# Patient Record
Sex: Male | Born: 1965 | Race: White | Hispanic: No | Marital: Married | Smoking: Former smoker
Health system: Southern US, Community
[De-identification: ages and names within clinical notes are randomized; demographics above are authoritative.]

## PROBLEM LIST (undated history)

## (undated) DIAGNOSIS — Z803 Family history of malignant neoplasm of breast: Secondary | ICD-10-CM

## (undated) DIAGNOSIS — Z800 Family history of malignant neoplasm of digestive organs: Secondary | ICD-10-CM

## (undated) DIAGNOSIS — Z8489 Family history of other specified conditions: Secondary | ICD-10-CM

## (undated) DIAGNOSIS — J189 Pneumonia, unspecified organism: Secondary | ICD-10-CM

## (undated) HISTORY — PX: APPENDECTOMY: SHX54

## (undated) HISTORY — DX: Family history of malignant neoplasm of breast: Z80.3

## (undated) HISTORY — DX: Family history of malignant neoplasm of digestive organs: Z80.0

## (undated) HISTORY — PX: OTHER SURGICAL HISTORY: SHX169

---

## 2000-06-18 ENCOUNTER — Encounter: Payer: Self-pay | Admitting: Orthopedic Surgery

## 2000-06-19 ENCOUNTER — Encounter: Payer: Self-pay | Admitting: Orthopedic Surgery

## 2000-06-19 ENCOUNTER — Inpatient Hospital Stay (HOSPITAL_COMMUNITY): Admission: AD | Admit: 2000-06-19 | Discharge: 2000-06-21 | Payer: Self-pay | Admitting: Orthopedic Surgery

## 2000-06-20 ENCOUNTER — Encounter: Payer: Self-pay | Admitting: Orthopedic Surgery

## 2001-09-21 ENCOUNTER — Encounter: Payer: Self-pay | Admitting: Emergency Medicine

## 2001-09-21 ENCOUNTER — Emergency Department (HOSPITAL_COMMUNITY): Admission: EM | Admit: 2001-09-21 | Discharge: 2001-09-22 | Payer: Self-pay | Admitting: *Deleted

## 2003-03-31 ENCOUNTER — Encounter: Payer: Self-pay | Admitting: Emergency Medicine

## 2003-03-31 ENCOUNTER — Emergency Department (HOSPITAL_COMMUNITY): Admission: EM | Admit: 2003-03-31 | Discharge: 2003-03-31 | Payer: Self-pay | Admitting: Emergency Medicine

## 2012-09-16 ENCOUNTER — Emergency Department (HOSPITAL_BASED_OUTPATIENT_CLINIC_OR_DEPARTMENT_OTHER): Payer: Self-pay

## 2012-09-16 ENCOUNTER — Emergency Department (HOSPITAL_BASED_OUTPATIENT_CLINIC_OR_DEPARTMENT_OTHER)
Admission: EM | Admit: 2012-09-16 | Discharge: 2012-09-17 | Disposition: A | Discharge status: Home / Self Care | Payer: Self-pay | Attending: Emergency Medicine | Admitting: Emergency Medicine

## 2012-09-16 ENCOUNTER — Encounter (HOSPITAL_BASED_OUTPATIENT_CLINIC_OR_DEPARTMENT_OTHER): Payer: Self-pay | Admitting: *Deleted

## 2012-09-16 DIAGNOSIS — Y929 Unspecified place or not applicable: Secondary | ICD-10-CM | POA: Insufficient documentation

## 2012-09-16 DIAGNOSIS — W208XXA Other cause of strike by thrown, projected or falling object, initial encounter: Secondary | ICD-10-CM | POA: Insufficient documentation

## 2012-09-16 DIAGNOSIS — S61209A Unspecified open wound of unspecified finger without damage to nail, initial encounter: Secondary | ICD-10-CM | POA: Insufficient documentation

## 2012-09-16 DIAGNOSIS — Z23 Encounter for immunization: Secondary | ICD-10-CM | POA: Insufficient documentation

## 2012-09-16 DIAGNOSIS — S62639A Displaced fracture of distal phalanx of unspecified finger, initial encounter for closed fracture: Secondary | ICD-10-CM | POA: Insufficient documentation

## 2012-09-16 DIAGNOSIS — F17200 Nicotine dependence, unspecified, uncomplicated: Secondary | ICD-10-CM | POA: Insufficient documentation

## 2012-09-16 DIAGNOSIS — S62639B Displaced fracture of distal phalanx of unspecified finger, initial encounter for open fracture: Secondary | ICD-10-CM

## 2012-09-16 DIAGNOSIS — Y939 Activity, unspecified: Secondary | ICD-10-CM | POA: Insufficient documentation

## 2012-09-16 DIAGNOSIS — S61319A Laceration without foreign body of unspecified finger with damage to nail, initial encounter: Secondary | ICD-10-CM

## 2012-09-16 MED ORDER — ONDANSETRON 4 MG PO TBDP
ORAL_TABLET | ORAL | Status: AC
  Administered 2012-09-16: 4 mg via ORAL
  Filled 2012-09-16: qty 1

## 2012-09-16 MED ORDER — OXYCODONE-ACETAMINOPHEN 5-325 MG PO TABS: 1.0000 | ORAL_TABLET | Freq: Four times a day (QID) | ORAL | Status: DC | PRN

## 2012-09-16 MED ORDER — ONDANSETRON 4 MG PO TBDP
4.0000 mg | ORAL_TABLET | Freq: Once | ORAL | Status: AC
  Administered 2012-09-16: 4 mg via ORAL

## 2012-09-16 MED ORDER — TETANUS-DIPHTH-ACELL PERTUSSIS 5-2.5-18.5 LF-MCG/0.5 IM SUSP
0.5000 mL | Freq: Once | INTRAMUSCULAR | Status: AC
  Administered 2012-09-16: 0.5 mL via INTRAMUSCULAR
  Filled 2012-09-16: qty 0.5

## 2012-09-16 MED ORDER — CEPHALEXIN 500 MG PO CAPS: 500.0000 mg | ORAL_CAPSULE | Freq: Four times a day (QID) | ORAL | Status: DC

## 2012-09-16 MED ORDER — LIDOCAINE HCL (PF) 1 % IJ SOLN
30.0000 mL | Freq: Once | INTRAMUSCULAR | Status: AC
  Administered 2012-09-16: 30 mL
  Filled 2012-09-16: qty 10

## 2012-09-16 MED ORDER — CEPHALEXIN 250 MG PO CAPS
500.0000 mg | ORAL_CAPSULE | Freq: Once | ORAL | Status: AC
  Administered 2012-09-16: 500 mg via ORAL
  Filled 2012-09-16: qty 2

## 2012-09-16 NOTE — ED Notes (Signed)
Pt soaking finger in betadine solution. 

## 2012-09-16 NOTE — ED Provider Notes (Signed)
History     CSN: [id number]  Arrival date & time 09/16/12  1958   First MD Initiated Contact with Patient 09/16/12 2121      Chief Complaint  Patient presents with  . Finger Injury    (Consider location/radiation/quality/duration/timing/severity/associated sxs/prior treatment) Patient is a 47 y.o. male presenting with hand pain. The history is provided by the patient.  Hand Pain This is a new problem. The current episode started today. The problem occurs constantly. The problem has been unchanged. Pertinent negatives include no abdominal pain, chills, fever or nausea. Exacerbated by: Using the finger. He has tried nothing for the symptoms.    History reviewed. No pertinent past medical history.  Past Surgical History  Procedure Date  . Appendectomy     History reviewed. No pertinent family history.  History  Substance Use Topics  . Smoking status: Current Every Day Smoker -- 2.0 packs/day  . Smokeless tobacco: Not on file  . Alcohol Use: No      Review of Systems  Constitutional: Negative for fever and chills.  Gastrointestinal: Negative for nausea and abdominal pain.  All other systems reviewed and are negative.    Allergies  Review of patient's allergies indicates no known allergies.  Home Medications  No current outpatient prescriptions on file.  BP 152/100  Pulse 77  Temp 98.3 F (36.8 C) (Oral)  Resp 18  Ht 6' (1.829 m)  Wt 280 lb (127.007 kg)  BMI 37.97 kg/m2  SpO2 95%  Physical Exam  Nursing note and vitals reviewed. Constitutional: He is oriented to person, place, and time. He appears well-developed and well-nourished. No distress.  HENT:  Head: Normocephalic and atraumatic.  Mouth/Throat: No oropharyngeal exudate.  Eyes: EOM are normal. Pupils are equal, round, and reactive to light.  Neck: Normal range of motion. Neck supple.  Cardiovascular: Normal rate and regular rhythm.  Exam reveals no friction rub.   No murmur  heard. Pulmonary/Chest: Effort normal and breath sounds normal. No respiratory distress. He has no wheezes. He has no rales.  Abdominal: He exhibits no distension. There is no tenderness. There is no rebound.  Musculoskeletal: Normal range of motion. He exhibits no edema.       Hands:      With nail removed, large laceration through the nail bed extending almost the entire width of the nail bed starting from the ulnar corner of the nail towards the middle of the radial side of the nail bed.  Neurological: He is alert and oriented to person, place, and time.  Skin: He is not diaphoretic.    ED Course  NAIL REMOVAL Date/Time: 09/17/2012 10:50 PM Performed by: Elwin Mocha Authorized by: Ethelda Chick Consent: Verbal consent obtained. Location: right hand Location details: right small finger Anesthesia: digital block Local anesthetic: lidocaine 1% with epinephrine Anesthetic total: 7 ml Patient sedated: no Preparation: skin prepped with Betadine Amount removed: complete Wedge excision of skin of nail fold: no Nail bed sutured: yes Suture material: 5-0 Chromic gut Number of sutures: 6 Nail matrix removed: none Removed nail replaced and anchored: yes (with dermabond) Patient tolerance: Patient tolerated the procedure well with no immediate complications.   (including critical care time)  Labs Reviewed - No data to display Dg Finger Little Right  09/16/2012  *RADIOLOGY REPORT*  Clinical Data: Injury to right fifth digit, pain  RIGHT LITTLE FINGER 2+V  Comparison: None.  Findings: Mildly displaced distal tuft fracture.  Associated mild soft tissue swelling.  IMPRESSION: Mild displaced distal  tuft fracture.   Original Report Authenticated By: Charline Bills, M.D.      1. Nailbed laceration, finger   2. Open fracture of tuft of distal phalanx of finger       MDM   47 year old male here after he injured his right pinky finger. A steel plate fell onto the finger. His tetanus  is out of date and I updated here. Patient has right subungual hematoma with very small laceration at the ulnar edge of the nail bed. X-ray shows a mildly displaced fracture. Nailbed repair as above. Your removed and then replaced on after the repair was finished. Patient given hand surgery followup, pain meds, antibiotics.  Elwin Mocha, MD 09/17/12 [phone]

## 2012-09-16 NOTE — ED Notes (Signed)
EDP at bedside suturing finger.

## 2012-09-16 NOTE — ED Notes (Signed)
Pt smashed right fifth finger in steel plate. Pt went to [facility] and was sent here.

## 2012-09-20 NOTE — ED Provider Notes (Signed)
I saw and evaluated the patient, reviewed the resident's note and I agree with the findings and plan.  Adriann Thau K Linker, MD 09/20/12 1120 

## 2014-03-24 ENCOUNTER — Ambulatory Visit (INDEPENDENT_AMBULATORY_CARE_PROVIDER_SITE_OTHER): Payer: BC Managed Care – PPO | Admitting: General Surgery

## 2014-03-24 ENCOUNTER — Encounter (INDEPENDENT_AMBULATORY_CARE_PROVIDER_SITE_OTHER): Payer: Self-pay | Admitting: General Surgery

## 2014-03-24 VITALS — BP 130/80 | HR 76 | Temp 97.0°F | Ht 73.0 in | Wt 262.0 lb

## 2014-03-24 DIAGNOSIS — E669 Obesity, unspecified: Secondary | ICD-10-CM

## 2014-03-24 DIAGNOSIS — K436 Other and unspecified ventral hernia with obstruction, without gangrene: Secondary | ICD-10-CM | POA: Insufficient documentation

## 2014-03-24 NOTE — Patient Instructions (Signed)
You have an incarcerated ventral umbilical hernia. We cannot completely reduce this.  You will be scheduled for laparoscopic repair of the incarcerated hernia with mesh, possible open repair in the near future.      Laparoscopic Ventral Hernia Repair Laparoscopic ventral hernia repairis a surgery to fix a ventral hernia. Aventral hernia, also called an incisional hernia, is a bulge of body tissue or intestines that pushes through the front part of the abdomen. This can happen if the connective tissue covering the muscles over the abdomen has a weak spot or is torn because of a surgical cut (incision) from a previous surgery. Laparoscopic ventral hernia repair is often done soon after diagnosis to stop the hernia from getting bigger, becoming uncomfortable, or becoming an emergency. This surgery usually takes about 2 hours, but the time can vary greatly. LET [facility] CARE PROVIDER KNOW ABOUT:  Any allergies you have.  All medicines you are taking, including steroids, vitamins, herbs, eye drops, creams, and over-the-counter medicines.  Previous problems you or members of your family have had with the use of anesthetics.  Any blood disorders you have.  Previous surgeries you have had.  Medical conditions you have. RISKS AND COMPLICATIONS  Generally, laparoscopic ventral hernia repair is a safe procedure. However, as with any surgical procedure, problems can occur. Possible problems include:  Bleeding.  Trouble passing urine or having a bowel movement after the surgery.  Infection.  Pneumonia.  Blood clots.  Pain in the area of the hernia.  A bulge in the area of the hernia that may be caused by a collection of fluid.  Injury to intestines or other structures in the abdomen.  Return of the hernia after surgery. In some cases, your health care provider may need to stop the laparoscopic procedure and do regular, open surgery. This may be necessary for very difficult hernias,  when organs are hard to see, or when bleeding problems occur during surgery. BEFORE THE PROCEDURE   You may need to have blood tests, urine tests, a chest X-ray, or an electrocardiogram done before the day of the surgery.  Ask your health care provider about changing or stopping your regular medicines. This is especially important if you are taking diabetes medicines or blood thinners.  You may need to wash with a special type of germ-killing soap.  Do not eat or drink anything after midnight the night before the procedure or as directed by your health care provider.  Make plans to have someone drive you home after the procedure. PROCEDURE   Small monitors will be put on your body. They are used to check your heart, blood pressure, and oxygen level.  An IV access tube will be put into a vein in your hand or arm. Fluids and medicine will flow directly into your body through the IV tube.  You will be given medicine that makes you go to sleep (general anesthetic).  Your abdomen will be cleaned with a special soap to kill any germs on your skin.  Once you are asleep, several small incisions will be made in your abdomen.  The large space in your abdomen will be filled with air so that it expands. This gives your health care provider more room and a better view.  A thin, lighted tube with a tiny camera on the end (laparoscope) is put through a small incision in your abdomen. The camera on the laparoscope sends a picture to a TV screen in the operating room. This gives your  health care provider a good view inside your abdomen.  Hollow tubes are put through the other small incisions in your abdomen. The tools needed for the procedure are put through these tubes.  Your health care provider puts the tissue or intestines that formed the hernia back in place.  A screen-like patch (mesh) is used to close the hernia. This helps make the area stronger. Stitches, tacks, or staples are used to keep  the mesh in place.  Medicine and a bandage (dressing) or skin glue will be put over the incisions. AFTER THE PROCEDURE   You will stay in a recovery area until the anesthetic wears off. Your blood pressure and pulse will be checked often.  You may be able to go home the same day or may need to stay in the hospital for 1-2 days after surgery. Your health care provider will decide when you can go home.  You may feel some pain. You may be given medicine for pain.  You will be urged to do breathing exercises that involve taking deep breaths. This helps prevent a lung infection after a surgery.  You may have to wear compression stockings while you are in the hospital. These stockings help keep blood clots from forming in your legs. Document Released: 07/24/2012 Document Revised: 08/12/2013 Document Reviewed: 07/24/2012 [facility] Patient Information 2015 [place], [place]. This information is not intended to replace advice given to you by your health care provider. Make sure you discuss any questions you have with your health care provider.

## 2014-03-24 NOTE — Progress Notes (Signed)
Patient ID: Steve Mills, male   DOB: 08/15/1966, 48 y.o.   MRN: [id number]  Chief Complaint  Patient presents with  . Umbilical Hernia    HPI Steve Mills is a 48 y.o. male.  He is referred by Dr. Myriam Jacobson for evaluation of incarcerated umbilical ventral hernia.  The patient states that he has had a hernia around his umbilicus for 3 years. It is getting bigger. It is causing more pain. He cannot push it back in.  He's lost weight from 300 pounds to 265 pounds. He quit smoking 5 months ago. Wants to get the hernia repaired.  Past history is significant for open appendectomy through a long right sided transverse incision. He takes no medications. 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  HPI  History reviewed. No pertinent past medical history.  Past Surgical History  Procedure Laterality Date  . Appendectomy      History reviewed. No pertinent family history.  Social History History  Substance Use Topics  . Smoking status: Current Every Day Smoker -- 2.00 packs/day  . Smokeless tobacco: Not on file  . Alcohol Use: No    No Known Allergies  No current outpatient prescriptions on file.   No current facility-administered medications for this visit.    Review of Systems Review of Systems  Constitutional: Negative for fever, chills and unexpected weight change.  HENT: Negative for congestion, hearing loss, sore throat, trouble swallowing and voice change.   Eyes: Negative for visual disturbance.  Respiratory: Negative for cough and wheezing.   Cardiovascular: Negative for chest pain, palpitations and leg swelling.  Gastrointestinal: Positive for abdominal pain. Negative for nausea, vomiting, diarrhea, constipation, blood in stool, abdominal distention, anal bleeding and rectal pain.  Genitourinary: Negative for hematuria and difficulty urinating.  Musculoskeletal: Negative for arthralgias.  Skin: Negative for rash and wound.  Neurological: Negative for  seizures, syncope, weakness and headaches.  Hematological: Negative for adenopathy. Does not bruise/bleed easily.  Psychiatric/Behavioral: Negative for confusion.    Blood pressure 130/80, pulse 76, temperature 97 F (36.1 C), height 6\' 1"  (1.854 m), weight 262 lb (118.842 kg).  Physical Exam Physical Exam  Constitutional: He is oriented to person, place, and time. He appears well-developed and well-nourished. No distress.  BMI 34.  HENT:  Head: Normocephalic.  Nose: Nose normal.  Mouth/Throat: No oropharyngeal exudate.  Eyes: Conjunctivae and EOM are normal. Pupils are equal, round, and reactive to light. Right eye exhibits no discharge. Left eye exhibits no discharge. No scleral icterus.  Neck: Normal range of motion. Neck supple. No JVD present. No tracheal deviation present. No thyromegaly present.  Cardiovascular: Normal rate, regular rhythm, normal heart sounds and intact distal pulses.   No murmur heard. Pulmonary/Chest: Effort normal and breath sounds normal. No stridor. No respiratory distress. He has no wheezes. He has no rales. He exhibits no tenderness.  Abdominal: Soft. Bowel sounds are normal. He exhibits no distension and no mass. There is no tenderness. There is no rebound and no guarding.    Incarcerated ventral umbilical hernia. I cannot reduce this. The hernia bulge is at least 6 or 7 cm in diameter. The defect maybe 4 or 5 cm in diameter.  Musculoskeletal: Normal range of motion. He exhibits no edema and no tenderness.  Lymphadenopathy:    He has no cervical adenopathy.  Neurological: He is alert and oriented to person, place, and time. He has normal reflexes. Coordination normal.  Skin: Skin is warm and dry. No  rash noted. He is not diaphoretic. No erythema. No pallor.  Psychiatric: He has a normal mood and affect. His behavior is normal. Judgment and thought content normal.    Data Reviewed Office notes from PCP  Assessment    Incarcerated ventral umbilical  hernia  Former smoker. Quit 5 months ago  Obesity.  History open appendectomy. Significant incision     Plan    His hernia will need to be repaired because of its size, incarceration, and symptoms.  I told him we would attempt to do this laparoscopically, but it might need to be converted to open if we have trouble reducing the incarceration orbit adhesions were difficult. I told him that mesh would be used to reinforce the repair  He will be scheduled for laparoscopic repair of incarcerated ventral umbilical hernia in the near future with mesh.  I discussed the indications, details, techniques, and numerous risk of surgery with the patient and his wife. He is aware of the risk of bleeding, infection, conversion to open laparotomy, recurrence of the hernia, skin necrosis, injury to adjacent organs such as the intestine was major reconstructive surgery and other unforeseen problems. He understands all these issues and all his questions are answered. He agrees with this plan.        Abbagayle Zaragoza M 03/24/2014, 3:29 PM

## 2014-03-27 ENCOUNTER — Telehealth (INDEPENDENT_AMBULATORY_CARE_PROVIDER_SITE_OTHER): Payer: Self-pay | Admitting: General Surgery

## 2014-03-27 NOTE — Telephone Encounter (Signed)
Called patient about surgery, went over financial responsibilities, patient will call back to schedule.

## 2014-06-23 ENCOUNTER — Telehealth (INDEPENDENT_AMBULATORY_CARE_PROVIDER_SITE_OTHER): Payer: Self-pay

## 2014-06-23 NOTE — Telephone Encounter (Signed)
Per Dr Darrel Hoover request pt given appt for 11-19 to have pre op ck since has not been seen since August. Pt aware.

## 2014-07-01 NOTE — Progress Notes (Signed)
Please put orders in Epic surgery 07-15-14 pre op 07-09-14 Thanks

## 2014-07-02 ENCOUNTER — Other Ambulatory Visit (INDEPENDENT_AMBULATORY_CARE_PROVIDER_SITE_OTHER): Payer: Self-pay | Admitting: General Surgery

## 2014-07-07 NOTE — Patient Instructions (Addendum)
Steve Mills  07/07/2014   Your procedure is scheduled on: 07/15/14     Come thru the [facility] Entrance.    Follow the Signs to [place] at  0630      am  Call this number if you have problems the morning of surgery: [phone]   Remember:   Do not eat food or drink liquids after midnight.   Take these medicines the morning of surgery with A SIP OF WATER: Zyrtec if needed    Do not wear jewelry,   Do not wear lotions, powders, or perfumes.  deodorant.   Men may shave face and neck.  Do not bring valuables to the hospital.  Contacts, dentures or bridgework may not be worn into surgery.  Leave suitcase in the car. After surgery it may be brought to your room.  For patients admitted to the hospital, checkout time is 11:00 AM the day of  discharge.        Please read over the following fact sheets that you were given:  coughing and deep breathing exercises, leg exercises            [facility] - Preparing for Surgery Before surgery, you can play an important role.  Because skin is not sterile, your skin needs to be as free of germs as possible.  You can reduce the number of germs on your skin by washing with CHG (chlorahexidine gluconate) soap before surgery.  CHG is an antiseptic cleaner which kills germs and bonds with the skin to continue killing germs even after washing. Please DO NOT use if you have an allergy to CHG or antibacterial soaps.  If your skin becomes reddened/irritated stop using the CHG and inform your nurse when you arrive at Short Stay. Do not shave (including legs and underarms) for at least 48 hours prior to the first CHG shower.  You may shave your face/neck. Please follow these instructions carefully:  1.  Shower with CHG Soap the night before surgery and the  morning of Surgery.  2.  If you choose to wash your hair, wash your hair first as usual with your  normal  shampoo.  3.  After you shampoo, rinse your hair and body thoroughly to remove the   shampoo.                           4.  Use CHG as you would any other liquid soap.  You can apply chg directly  to the skin and wash                       Gently with a scrungie or clean washcloth.  5.  Apply the CHG Soap to your body ONLY FROM THE NECK DOWN.   Do not use on face/ open                           Wound or open sores. Avoid contact with eyes, ears mouth and genitals (private parts).                       Wash face,  Genitals (private parts) with your normal soap.             6.  Wash thoroughly, paying special attention to the area where your surgery  will be performed.  7.  Thoroughly rinse your  body with warm water from the neck down.  8.  DO NOT shower/wash with your normal soap after using and rinsing off  the CHG Soap.                9.  Pat yourself dry with a clean towel.            10.  Wear clean pajamas.            11.  Place clean sheets on your bed the night of your first shower and do not  sleep with pets. Day of Surgery : Do not apply any lotions/deodorants the morning of surgery.  Please wear clean clothes to the hospital/surgery center.  FAILURE TO FOLLOW THESE INSTRUCTIONS MAY RESULT IN THE CANCELLATION OF YOUR SURGERY PATIENT SIGNATURE_________________________________  NURSE SIGNATURE__________________________________  ________________________________________________________________________

## 2014-07-09 ENCOUNTER — Encounter (HOSPITAL_COMMUNITY)
Admission: RE | Admit: 2014-07-09 | Discharge: 2014-07-09 | Disposition: A | Discharge status: Home / Self Care | Payer: BC Managed Care – PPO | Source: Ambulatory Visit | Attending: General Surgery | Admitting: General Surgery

## 2014-07-09 ENCOUNTER — Encounter (HOSPITAL_COMMUNITY): Payer: Self-pay

## 2014-07-09 DIAGNOSIS — Z01818 Encounter for other preprocedural examination: Secondary | ICD-10-CM | POA: Insufficient documentation

## 2014-07-09 DIAGNOSIS — R001 Bradycardia, unspecified: Secondary | ICD-10-CM | POA: Insufficient documentation

## 2014-07-09 DIAGNOSIS — I498 Other specified cardiac arrhythmias: Secondary | ICD-10-CM | POA: Insufficient documentation

## 2014-07-09 HISTORY — DX: Pneumonia, unspecified organism: J18.9

## 2014-07-09 HISTORY — DX: Family history of other specified conditions: Z84.89

## 2014-07-09 LAB — CBC WITH DIFFERENTIAL/PLATELET
BASOS ABS: 0.1 10*3/uL (ref 0.0–0.1)
BASOS PCT: 1 % (ref 0–1)
EOS PCT: 7 % — AB (ref 0–5)
Eosinophils Absolute: 0.9 10*3/uL — ABNORMAL HIGH (ref 0.0–0.7)
HEMATOCRIT: 43.7 % (ref 39.0–52.0)
HEMOGLOBIN: 14.8 g/dL (ref 13.0–17.0)
LYMPHS PCT: 44 % (ref 12–46)
Lymphs Abs: 5.2 10*3/uL — ABNORMAL HIGH (ref 0.7–4.0)
MCH: 29.6 pg (ref 26.0–34.0)
MCHC: 33.9 g/dL (ref 30.0–36.0)
MCV: 87.4 fL (ref 78.0–100.0)
MONO ABS: 0.9 10*3/uL (ref 0.1–1.0)
MONOS PCT: 8 % (ref 3–12)
NEUTROS ABS: 4.6 10*3/uL (ref 1.7–7.7)
Neutrophils Relative %: 40 % — ABNORMAL LOW (ref 43–77)
Platelets: 301 10*3/uL (ref 150–400)
RBC: 5 MIL/uL (ref 4.22–5.81)
RDW: 13 % (ref 11.5–15.5)
WBC: 11.6 10*3/uL — ABNORMAL HIGH (ref 4.0–10.5)

## 2014-07-09 LAB — URINALYSIS, ROUTINE W REFLEX MICROSCOPIC
Bilirubin Urine: NEGATIVE
Glucose, UA: NEGATIVE mg/dL
Hgb urine dipstick: NEGATIVE
KETONES UR: NEGATIVE mg/dL
LEUKOCYTES UA: NEGATIVE
NITRITE: NEGATIVE
PROTEIN: NEGATIVE mg/dL
Specific Gravity, Urine: 1.022 (ref 1.005–1.030)
UROBILINOGEN UA: 1 mg/dL (ref 0.0–1.0)
pH: 6 (ref 5.0–8.0)

## 2014-07-09 LAB — COMPREHENSIVE METABOLIC PANEL
ALT: 18 U/L (ref 0–53)
ANION GAP: 11 (ref 5–15)
AST: 20 U/L (ref 0–37)
Albumin: 4.1 g/dL (ref 3.5–5.2)
Alkaline Phosphatase: 66 U/L (ref 39–117)
BILIRUBIN TOTAL: 0.4 mg/dL (ref 0.3–1.2)
BUN: 14 mg/dL (ref 6–23)
CALCIUM: 9.9 mg/dL (ref 8.4–10.5)
CHLORIDE: 101 meq/L (ref 96–112)
CO2: 28 meq/L (ref 19–32)
CREATININE: 0.9 mg/dL (ref 0.50–1.35)
GFR calc Af Amer: >90 mL/min (ref >90)
Glucose, Bld: 88 mg/dL (ref 70–99)
Potassium: 5.3 mEq/L (ref 3.7–5.3)
Sodium: 140 mEq/L (ref 137–147)
Total Protein: 7.3 g/dL (ref 6.0–8.3)

## 2014-07-12 NOTE — H&P (Addendum)
Steve Mills  Location: [facility] Patient #: [id number] DOB: Jan 31, 1966 Married / Language: English / Race: White Male    History of Present Illness  Patient words: pre Op check.  The patient is a 48 year old male who presents with an incisional hernia. This patient returns for a preop check. He is scheduled for laparoscopic repair of incarcerated incisional hernia with mesh, possible open repair on November 25. [name] is his PCP. He quit smoking 9 months ago. He has lost about 40 pounds by going to the gym. He has had a hernia around his umbilicus for 3 years or more. It is getting bigger and he cannot push it back in.  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   Other Problems No pertinent past medical history  Past Surgical History Appendectomy Tonsillectomy  Diagnostic Studies History  Colonoscopy never  Allergies  No Known Drug Allergies11/19/2015   Medication History  No Current Medications  Social History Alcohol use Occasional alcohol use. Caffeine use Carbonated beverages. No drug use Tobacco use Former smoker.  Family History  Breast Cancer Mother. Cerebrovascular Accident Mother. Colon Polyps Father. Diabetes Mellitus Father.  Review of Systems  General Not Present- Appetite Loss, Chills, Fatigue, Fever, Night Sweats, Weight Gain and Weight Loss. Skin Not Present- Change in Wart/Mole, Dryness, Hives, Jaundice, New Lesions, Non-Healing Wounds, Rash and Ulcer. HEENT Not Present- Earache, Hearing Loss, Hoarseness, Nose Bleed, Oral Ulcers, Ringing in the Ears, Seasonal Allergies, Sinus Pain, Sore Throat, Visual Disturbances, Wears glasses/contact lenses and Yellow Eyes. Respiratory Not Present- Bloody sputum, Chronic Cough, Difficulty Breathing, Snoring and Wheezing. Breast Not Present- Breast Mass, Breast Pain, Nipple Discharge and Skin Changes. Cardiovascular Not Present- Chest  Pain, Difficulty Breathing Lying Down, Leg Cramps, Palpitations, Rapid Heart Rate, Shortness of Breath and Swelling of Extremities. Gastrointestinal Not Present- Abdominal Pain, Bloating, Bloody Stool, Change in Bowel Habits, Chronic diarrhea, Constipation, Difficulty Swallowing, Excessive gas, Gets full quickly at meals, Hemorrhoids, Indigestion, Nausea, Rectal Pain and Vomiting. Male Genitourinary Not Present- Blood in Urine, Change in Urinary Stream, Frequency, Impotence, Nocturia, Painful Urination, Urgency and Urine Leakage. Musculoskeletal Not Present- Back Pain, Joint Pain, Joint Stiffness, Muscle Pain, Muscle Weakness and Swelling of Extremities. Neurological Not Present- Decreased Memory, Fainting, Headaches, Numbness, Seizures, Tingling, Tremor, Trouble walking and Weakness. Psychiatric Not Present- Anxiety, Bipolar, Change in Sleep Pattern, Depression, Fearful and Frequent crying. Endocrine Not Present- Cold Intolerance, Excessive Hunger, Hair Changes, Heat Intolerance, Hot flashes and New Diabetes. Hematology Not Present- Easy Bruising, Excessive bleeding, Gland problems, HIV and Persistent Infections.   Vitals Briant Cedar CMA; 07/09/2014 12:48 PM) 07/09/2014 12:47 PM Weight: 267.38 lb Height: 73in Body Surface Area: 2.5 m Body Mass Index: 35.28 kg/m Temp.: 97.11F  Pulse: 75 (Regular)  BP: 120/80 (Sitting, Left Arm, Standard)    Physical Exam  General Note: Large man. Friendly. Cooperative. No distress. BMI 35.28   Head and Neck Note: No adenopathy or mass   Chest and Lung Exam Note: Lungs are clear to also patient bilaterally   Cardiovascular Note: Regular rate and rhythm, no murmur. No ectopy.   Abdomen Note: Abdomen is large, soft, nontender. No distention or mass. No tenderness. Long right transverse incision extending up to the umbilicus. Incarcerated incisional hernia in the periumbilical area that I cannot reduce. The bulge is about 6  cm. The defect may be smaller. I cannot feel the edges of the defect. The skin is healthy and  noninflamed.     Assessment & Plan  INCARCERATED INCISIONAL HERNIA (552.21  K43.0) Current Plans  Schedule for Surgery Plan laparoscopic repair of incarcerated ventral hernia with mesh, possible open repair. Date of surgery is set for November 25.   BMI 35.0-35.9,ADULT (V85.35  Z68.35)  FORMER SMOKER (V15.82  Z87.891)  HISTORY OF APPENDECTOMY (V45.89  Z90.49)    Edsel Petrin. Dalbert Batman, M.D., [facility] General and Minimally invasive Surgery Breast and Colorectal Surgery Office:   [phone] Pager:   [phone]

## 2014-07-14 MED ORDER — DEXTROSE 5 % IV SOLN
3.0000 g | INTRAVENOUS | Status: AC
  Administered 2014-07-15: 3 g via INTRAVENOUS
  Filled 2014-07-14: qty 3000

## 2014-07-15 ENCOUNTER — Ambulatory Visit (HOSPITAL_COMMUNITY): Payer: BC Managed Care – PPO | Admitting: Certified Registered Nurse Anesthetist

## 2014-07-15 ENCOUNTER — Encounter (HOSPITAL_COMMUNITY): Payer: Self-pay | Admitting: *Deleted

## 2014-07-15 ENCOUNTER — Ambulatory Visit (HOSPITAL_COMMUNITY)
Admission: RE | Admit: 2014-07-15 | Discharge: 2014-07-17 | Disposition: A | Discharge status: Home / Self Care | Payer: BC Managed Care – PPO | Source: Ambulatory Visit | Attending: General Surgery | Admitting: General Surgery

## 2014-07-15 ENCOUNTER — Encounter (HOSPITAL_COMMUNITY)
Admission: RE | Disposition: A | Discharge status: Home / Self Care | Payer: Self-pay | Source: Ambulatory Visit | Attending: General Surgery

## 2014-07-15 DIAGNOSIS — K430 Incisional hernia with obstruction, without gangrene: Secondary | ICD-10-CM | POA: Diagnosis present

## 2014-07-15 DIAGNOSIS — K436 Other and unspecified ventral hernia with obstruction, without gangrene: Secondary | ICD-10-CM | POA: Diagnosis present

## 2014-07-15 DIAGNOSIS — Z87891 Personal history of nicotine dependence: Secondary | ICD-10-CM | POA: Diagnosis not present

## 2014-07-15 HISTORY — PX: VENTRAL HERNIA REPAIR: SHX424

## 2014-07-15 LAB — CBC
HEMATOCRIT: 42.4 % (ref 39.0–52.0)
HEMOGLOBIN: 14.6 g/dL (ref 13.0–17.0)
MCH: 29.7 pg (ref 26.0–34.0)
MCHC: 34.4 g/dL (ref 30.0–36.0)
MCV: 86.4 fL (ref 78.0–100.0)
Platelets: 272 10*3/uL (ref 150–400)
RBC: 4.91 MIL/uL (ref 4.22–5.81)
RDW: 12.9 % (ref 11.5–15.5)
WBC: 16.3 10*3/uL — ABNORMAL HIGH (ref 4.0–10.5)

## 2014-07-15 LAB — CREATININE, SERUM
Creatinine, Ser: 0.79 mg/dL (ref 0.50–1.35)
GFR calc Af Amer: >90 mL/min (ref >90)
GFR calc non Af Amer: >90 mL/min (ref >90)

## 2014-07-15 SURGERY — REPAIR, HERNIA, VENTRAL, LAPAROSCOPIC
Anesthesia: General | Site: Abdomen

## 2014-07-15 MED ORDER — HYDROMORPHONE HCL 1 MG/ML IJ SOLN
INTRAMUSCULAR | Status: AC
  Filled 2014-07-15: qty 1

## 2014-07-15 MED ORDER — LIDOCAINE HCL (CARDIAC) 20 MG/ML IV SOLN
INTRAVENOUS | Status: AC
  Filled 2014-07-15: qty 5

## 2014-07-15 MED ORDER — PROPOFOL 10 MG/ML IV BOLUS
INTRAVENOUS | Status: AC
  Filled 2014-07-15: qty 20

## 2014-07-15 MED ORDER — EPHEDRINE SULFATE 50 MG/ML IJ SOLN
INTRAMUSCULAR | Status: DC | PRN
  Administered 2014-07-15: 10 mg via INTRAVENOUS

## 2014-07-15 MED ORDER — 0.9 % SODIUM CHLORIDE (POUR BTL) OPTIME
TOPICAL | Status: DC | PRN
  Administered 2014-07-15: 1000 mL

## 2014-07-15 MED ORDER — FENTANYL CITRATE 0.05 MG/ML IJ SOLN
INTRAMUSCULAR | Status: DC | PRN
  Administered 2014-07-15 (×3): 50 ug via INTRAVENOUS
  Administered 2014-07-15: 100 ug via INTRAVENOUS

## 2014-07-15 MED ORDER — MIDAZOLAM HCL 5 MG/5ML IJ SOLN
INTRAMUSCULAR | Status: DC | PRN
  Administered 2014-07-15: 2 mg via INTRAVENOUS

## 2014-07-15 MED ORDER — GLYCOPYRROLATE 0.2 MG/ML IJ SOLN
INTRAMUSCULAR | Status: DC | PRN
  Administered 2014-07-15: 0.6 mg via INTRAVENOUS

## 2014-07-15 MED ORDER — OXYCODONE-ACETAMINOPHEN 5-325 MG PO TABS
1.0000 | ORAL_TABLET | ORAL | Status: DC | PRN
  Administered 2014-07-15 – 2014-07-16 (×2): 1 via ORAL
  Filled 2014-07-15 (×2): qty 1

## 2014-07-15 MED ORDER — NEOSTIGMINE METHYLSULFATE 10 MG/10ML IV SOLN
INTRAVENOUS | Status: AC
  Filled 2014-07-15: qty 1

## 2014-07-15 MED ORDER — ROCURONIUM BROMIDE 100 MG/10ML IV SOLN
INTRAVENOUS | Status: AC
  Filled 2014-07-15: qty 1

## 2014-07-15 MED ORDER — ONDANSETRON HCL 4 MG/2ML IJ SOLN
INTRAMUSCULAR | Status: DC | PRN
  Administered 2014-07-15: 4 mg via INTRAVENOUS

## 2014-07-15 MED ORDER — ONDANSETRON HCL 4 MG PO TABS: 4.0000 mg | ORAL_TABLET | Freq: Four times a day (QID) | ORAL | Status: DC | PRN

## 2014-07-15 MED ORDER — POTASSIUM CHLORIDE IN NACL 20-0.9 MEQ/L-% IV SOLN
INTRAVENOUS | Status: DC
  Administered 2014-07-15 – 2014-07-16 (×2): via INTRAVENOUS
  Administered 2014-07-16: 125 mL via INTRAVENOUS
  Administered 2014-07-17: 04:00:00 via INTRAVENOUS
  Filled 2014-07-15 (×6): qty 1000

## 2014-07-15 MED ORDER — ROCURONIUM BROMIDE 100 MG/10ML IV SOLN
INTRAVENOUS | Status: DC | PRN
  Administered 2014-07-15: 20 mg via INTRAVENOUS
  Administered 2014-07-15: 30 mg via INTRAVENOUS

## 2014-07-15 MED ORDER — PROPOFOL 10 MG/ML IV BOLUS
INTRAVENOUS | Status: DC | PRN
  Administered 2014-07-15: 200 mg via INTRAVENOUS

## 2014-07-15 MED ORDER — MIDAZOLAM HCL 2 MG/2ML IJ SOLN
INTRAMUSCULAR | Status: AC
  Filled 2014-07-15: qty 2

## 2014-07-15 MED ORDER — BUPIVACAINE-EPINEPHRINE 0.5% -1:200000 IJ SOLN
INTRAMUSCULAR | Status: DC | PRN
  Administered 2014-07-15: 16 mL

## 2014-07-15 MED ORDER — FENTANYL CITRATE 0.05 MG/ML IJ SOLN
INTRAMUSCULAR | Status: AC
  Filled 2014-07-15: qty 5

## 2014-07-15 MED ORDER — CEFAZOLIN SODIUM-DEXTROSE 2-3 GM-% IV SOLR
2.0000 g | Freq: Three times a day (TID) | INTRAVENOUS | Status: AC
  Administered 2014-07-15 – 2014-07-16 (×3): 2 g via INTRAVENOUS
  Filled 2014-07-15 (×3): qty 50

## 2014-07-15 MED ORDER — EPHEDRINE SULFATE 50 MG/ML IJ SOLN
INTRAMUSCULAR | Status: AC
  Filled 2014-07-15: qty 1

## 2014-07-15 MED ORDER — DEXAMETHASONE SODIUM PHOSPHATE 10 MG/ML IJ SOLN
INTRAMUSCULAR | Status: AC
  Filled 2014-07-15: qty 1

## 2014-07-15 MED ORDER — ONDANSETRON HCL 4 MG/2ML IJ SOLN
INTRAMUSCULAR | Status: AC
  Filled 2014-07-15: qty 2

## 2014-07-15 MED ORDER — BUPIVACAINE-EPINEPHRINE 0.5% -1:200000 IJ SOLN
INTRAMUSCULAR | Status: AC
  Filled 2014-07-15: qty 1

## 2014-07-15 MED ORDER — LIDOCAINE HCL (CARDIAC) 20 MG/ML IV SOLN
INTRAVENOUS | Status: DC | PRN
  Administered 2014-07-15: 50 mg via INTRAVENOUS

## 2014-07-15 MED ORDER — GLYCOPYRROLATE 0.2 MG/ML IJ SOLN
INTRAMUSCULAR | Status: AC
  Filled 2014-07-15: qty 3

## 2014-07-15 MED ORDER — SUCCINYLCHOLINE CHLORIDE 20 MG/ML IJ SOLN
INTRAMUSCULAR | Status: DC | PRN
  Administered 2014-07-15: 100 mg via INTRAVENOUS

## 2014-07-15 MED ORDER — ONDANSETRON HCL 4 MG/2ML IJ SOLN
4.0000 mg | Freq: Four times a day (QID) | INTRAMUSCULAR | Status: DC | PRN
  Administered 2014-07-15 (×2): 4 mg via INTRAVENOUS
  Filled 2014-07-15 (×2): qty 2

## 2014-07-15 MED ORDER — HYDROMORPHONE HCL 1 MG/ML IJ SOLN
1.0000 mg | INTRAMUSCULAR | Status: DC | PRN
  Administered 2014-07-15: 1 mg via INTRAVENOUS
  Filled 2014-07-15 (×2): qty 1

## 2014-07-15 MED ORDER — HEPARIN SODIUM (PORCINE) 5000 UNIT/ML IJ SOLN
5000.0000 [IU] | Freq: Three times a day (TID) | INTRAMUSCULAR | Status: DC
  Administered 2014-07-15 – 2014-07-17 (×6): 5000 [IU] via SUBCUTANEOUS
  Filled 2014-07-15 (×9): qty 1

## 2014-07-15 MED ORDER — HYDROMORPHONE HCL 1 MG/ML IJ SOLN: 0.2500 mg | INTRAMUSCULAR | Status: DC | PRN

## 2014-07-15 MED ORDER — LACTATED RINGERS IV SOLN
INTRAVENOUS | Status: DC
  Administered 2014-07-15: 1000 mL via INTRAVENOUS
  Administered 2014-07-15 (×2): via INTRAVENOUS

## 2014-07-15 MED ORDER — HYDROMORPHONE HCL 1 MG/ML IJ SOLN
0.2500 mg | INTRAMUSCULAR | Status: DC | PRN
  Administered 2014-07-15 (×3): 0.5 mg via INTRAVENOUS

## 2014-07-15 MED ORDER — DEXAMETHASONE SODIUM PHOSPHATE 10 MG/ML IJ SOLN
INTRAMUSCULAR | Status: DC | PRN
  Administered 2014-07-15: 10 mg via INTRAVENOUS

## 2014-07-15 MED ORDER — NEOSTIGMINE METHYLSULFATE 10 MG/10ML IV SOLN
INTRAVENOUS | Status: DC | PRN
  Administered 2014-07-15: 4 mg via INTRAVENOUS

## 2014-07-15 SURGICAL SUPPLY — 42 items
APPLIER CLIP 5 13 M/L LIGAMAX5 (MISCELLANEOUS)
BENZOIN TINCTURE PRP APPL 2/3 (GAUZE/BANDAGES/DRESSINGS) IMPLANT
BINDER ABD UNIV 12 45-62 (WOUND CARE) ×1 IMPLANT
BINDER ABDOMINAL 12 ML 46-62 (SOFTGOODS) IMPLANT
BINDER ABDOMINAL 46IN 62IN (WOUND CARE) ×2
BNDG GAUZE ELAST 4 BULKY (GAUZE/BANDAGES/DRESSINGS) ×2 IMPLANT
CLIP APPLIE 5 13 M/L LIGAMAX5 (MISCELLANEOUS) IMPLANT
DECANTER SPIKE VIAL GLASS SM (MISCELLANEOUS) ×2 IMPLANT
DEVICE SECURE STRAP 25 ABSORB (INSTRUMENTS) ×2 IMPLANT
DEVICE TROCAR PUNCTURE CLOSURE (ENDOMECHANICALS) ×2 IMPLANT
DRAPE LAPAROSCOPIC ABDOMINAL (DRAPES) ×2 IMPLANT
DRAPE UTILITY XL STRL (DRAPES) ×2 IMPLANT
DRSG PAD ABDOMINAL 8X10 ST (GAUZE/BANDAGES/DRESSINGS) IMPLANT
ELECT REM PT RETURN 9FT ADLT (ELECTROSURGICAL) ×2
ELECTRODE REM PT RTRN 9FT ADLT (ELECTROSURGICAL) ×1 IMPLANT
GLOVE EUDERMIC 7 POWDERFREE (GLOVE) ×2 IMPLANT
GOWN STRL REUS W/TWL XL LVL3 (GOWN DISPOSABLE) ×4 IMPLANT
KIT BASIN OR (CUSTOM PROCEDURE TRAY) ×2 IMPLANT
LIQUID BAND (GAUZE/BANDAGES/DRESSINGS) ×2 IMPLANT
MARKER SKIN DUAL TIP RULER LAB (MISCELLANEOUS) ×2 IMPLANT
MESH VENTRALIGHT ST 8IN CRC (Mesh General) ×2 IMPLANT
NEEDLE SPNL 22GX3.5 QUINCKE BK (NEEDLE) ×2 IMPLANT
PAD ABD 8X10 STRL (GAUZE/BANDAGES/DRESSINGS) ×2 IMPLANT
PEN SKIN MARKING BROAD (MISCELLANEOUS) ×2 IMPLANT
SCISSORS LAP 5X35 DISP (ENDOMECHANICALS) ×2 IMPLANT
SET IRRIG TUBING LAPAROSCOPIC (IRRIGATION / IRRIGATOR) IMPLANT
SHEARS HARMONIC ACE PLUS 36CM (ENDOMECHANICALS) ×2 IMPLANT
SLEEVE XCEL OPT CAN 5 100 (ENDOMECHANICALS) ×4 IMPLANT
SOLUTION ANTI FOG 6CC (MISCELLANEOUS) ×2 IMPLANT
STAPLER VISISTAT 35W (STAPLE) IMPLANT
STRIP CLOSURE SKIN 1/2X4 (GAUZE/BANDAGES/DRESSINGS) IMPLANT
SUT MNCRL AB 4-0 PS2 18 (SUTURE) ×2 IMPLANT
SUT NOVA 0 T19/GS 22DT (SUTURE) IMPLANT
SUT NOVA NAB DX-16 0-1 5-0 T12 (SUTURE) ×4 IMPLANT
TACKER 5MM HERNIA 3.5CML NAB (ENDOMECHANICALS) IMPLANT
TOWEL OR 17X26 10 PK STRL BLUE (TOWEL DISPOSABLE) ×2 IMPLANT
TOWEL OR NON WOVEN STRL DISP B (DISPOSABLE) ×2 IMPLANT
TRAY FOLEY CATH 14FRSI W/METER (CATHETERS) IMPLANT
TRAY LAPAROSCOPIC (CUSTOM PROCEDURE TRAY) ×2 IMPLANT
TROCAR BLADELESS OPT 5 100 (ENDOMECHANICALS) ×2 IMPLANT
TROCAR XCEL NON-BLD 11X100MML (ENDOMECHANICALS) IMPLANT
TUBING INSUFFLATION 10FT LAP (TUBING) ×2 IMPLANT

## 2014-07-15 NOTE — Transfer of Care (Signed)
Immediate Anesthesia Transfer of Care Note  Patient: Steve Mills  Procedure(s) Performed: Procedure(s): LAPAROSCOPIC REPAIR INCARCARTED UMBILICAL  VENTRAL HERNIA, POSSIBLE OPEN (N/A)  Patient Location: PACU  Anesthesia Type:General  Level of Consciousness: awake, alert  and oriented  Airway & Oxygen Therapy: Patient Spontanous Breathing and Patient connected to face mask oxygen  Post-op Assessment: Report given to PACU RN and Post -op Vital signs reviewed and stable  Post vital signs: Reviewed and stable  Complications: No apparent anesthesia complications

## 2014-07-15 NOTE — Anesthesia Postprocedure Evaluation (Signed)
  Anesthesia Post-op Note  Patient: Steve Mills  Procedure(s) Performed: Procedure(s): LAPAROSCOPIC REPAIR INCARCARTED UMBILICAL  VENTRAL HERNIA, POSSIBLE OPEN (N/A) Patient is awake and responsive. Pain and nausea are reasonably well controlled. Vital signs are stable and clinically acceptable. Oxygen saturation is clinically acceptable. There are no apparent anesthetic complications at this time. Patient is ready for discharge.

## 2014-07-15 NOTE — Anesthesia Preprocedure Evaluation (Signed)
Anesthesia Evaluation  Patient identified by MRN, date of birth, ID band Patient awake    Reviewed: Allergy & Precautions, H&P , Patient's Chart, lab work & pertinent test results, reviewed documented beta blocker date and time   Airway Mallampati: II TM Distance: >3 FB Neck ROM: full    Dental no notable dental hx.    Pulmonary former smoker,  breath sounds clear to auscultation  Pulmonary exam normal       Cardiovascular Rhythm:regular Rate:Normal     Neuro/Psych    GI/Hepatic   Endo/Other    Renal/GU      Musculoskeletal   Abdominal   Peds  Hematology   Anesthesia Other Findings   Reproductive/Obstetrics                           Anesthesia Physical Anesthesia Plan  ASA: II  Anesthesia Plan: General   Post-op Pain Management:    Induction: Intravenous  Airway Management Planned: Oral ETT  Additional Equipment:   Intra-op Plan:   Post-operative Plan: Extubation in OR  Informed Consent: I have reviewed the patients History and Physical, chart, labs and discussed the procedure including the risks, benefits and alternatives for the proposed anesthesia with the patient or authorized representative who has indicated his/her understanding and acceptance.   Dental Advisory Given and Dental advisory given  Plan Discussed with: CRNA and Surgeon  Anesthesia Plan Comments: (  Discussed general anesthesia, including possible nausea, instrumentation of airway, sore throat,pulmonary aspiration, etc. I asked if the were any outstanding questions, or  concerns before we proceeded. )        Anesthesia Quick Evaluation  

## 2014-07-15 NOTE — Interval H&P Note (Signed)
History and Physical Interval Note:  07/15/2014 7:51 AM  Steve Mills  has presented today for surgery, with the diagnosis of incarcarted umbilcal ventral hernia  The various methods of treatment have been discussed with the patient and family. After consideration of risks, benefits and other options for treatment, the patient has consented to  Procedure(s): [place], POSSIBLE OPEN (N/A) as a surgical intervention .  The patient's history has been reviewed, patient examined today, no change in status, stable for surgery.  I have reviewed the patient's chart and labs.  Questions were answered to the patient's satisfaction.     Adin Hector

## 2014-07-15 NOTE — Op Note (Signed)
Patient Name:           Steve Mills   Date of Surgery:        07/15/2014  Pre op Diagnosis:      Incarcerated ventral hernia  Post op Diagnosis:    Same  Procedure:                 Laparoscopic repair of incarcerated ventral hernia with 20 cm x 20 cm ventraLite ST mesh  Surgeon:                     Edsel Petrin. Dalbert Batman, M.D., FACS  Assistant:                      OR staff  Operative Indications:   The patient is a 48 year old male who presents with an incisional hernia. This patient returns for a preop check. He is scheduled for laparoscopic repair of incarcerated incisional hernia with mesh, possible open repair on November 25. Steve Mills is his PCP. He quit smoking 9 months ago. He has lost about 40 pounds by going to the gym. He has had a hernia around his umbilicus for 3 years or more.  There is a long right transverse incision at the level of the umbilicus going up nearly to the umbilicus from previous ruptured appendectomy. I think he has a combination of an incarcerated incisional and incarcerated umbilical hernia.. It is getting bigger and he cannot push it back in.  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  Operative Findings:       The hernia defect was just at and to the right of the midline. It was probably about 3 cm in diameter. There was a huge amount of incarcerated omentum caught up in the hernia sac was probably at least 10-12 cm in diameter. This took 30 minutes to reduce. There was no incarcerated intestine, however. We repair the defect with a 20 cm diameter ventraLite- ST composite mesh.  Procedure in Detail:          Following the induction of general endotracheal anesthesia the patient's abdomen was prepped and draped in a sterile fashion. Surgical timeout was performed. Intravenous antibiotics were given. 0.5% Marcaine with epinephrine was used as local infiltration anesthetic. A 5 mm optical trocar was placed in the left  subcostal area without any difficulty. Pneumoperitoneum was created. Camera was inserted. There is no evidence of bleeding or injury. An 11 mm trocar placed in the left flank and a 5 mm trocar placed in the left lower quadrant. We spent some time with traction, countertraction, scissor dissection, and harmonic dissection but ultimately we were able to completely reduce all of the incarcerated contents. Using a spinal needle I marked the defect. Probably 4 cm in diameter or less. I brought a 20 cm diameter circular piece of the VentraLite- ST mesh to the operative field. I drew a template on the abdominal wall for 8 suture fixation sites. #1 Novafil was used for the suture fixation. The sutures were placed in the mesh, being careful to position the mesh with the rough side up toward the abdominal wall and the smooth side down toward the viscera. After all 8 sutures were placed the mesh was moistened rolled up and inserted into the abdominal cavity. It was then opened up and positioned according to the template. At each of the 8 mesh fixation sites  I made a small puncture wound and using the Endo Close device I passed  the Novafil sutures up through the abdominal wall, being careful to take about a 1 cm bite of fascia. After all 8 of these were placed I lifted the mesh up and it deployed  nicely and in all directions without any redundancy. The sutures were tied. I then further secured the mesh with a  Secure Strap tacking device in a double crown technique. The outer crown at the edge of the mesh was placed so that the tacks were placed no  more than 1 cm apart. The inner crown was placed. This provided very good security. The integrity of the tacking was checked 3 or 4 times. There was no bleeding. Pneumoperitoneum was released and the trochars removed. Skin incisions were closed with 4-0  Monocryl subcuticular sutures and Dermabond. A dry gauze bolster was placed at the umbilicus and a Velcro binder placed. The  patient tolerated the procedure well was taken to PACU in stable condition. EBL less than 20 mL. Counts correct. Complications none.     Edsel Petrin. Dalbert Batman, M.D., FACS General and Minimally Invasive Surgery Breast and Colorectal Surgery  07/15/2014 9:50 AM

## 2014-07-16 DIAGNOSIS — K430 Incisional hernia with obstruction, without gangrene: Secondary | ICD-10-CM | POA: Diagnosis not present

## 2014-07-16 MED ORDER — METHOCARBAMOL 500 MG PO TABS: 500.0000 mg | ORAL_TABLET | Freq: Four times a day (QID) | ORAL | Status: DC | PRN

## 2014-07-16 MED ORDER — KETOROLAC TROMETHAMINE 15 MG/ML IJ SOLN
15.0000 mg | Freq: Four times a day (QID) | INTRAMUSCULAR | Status: AC
  Administered 2014-07-16 – 2014-07-17 (×4): 15 mg via INTRAVENOUS
  Filled 2014-07-16 (×4): qty 1

## 2014-07-16 NOTE — Progress Notes (Signed)
1 Day Post-Op lap VHR Subjective: Feeling very sore and bloated.  Having nausea.  Unable to stand without "excruciating" pain.  No flatus or BM.  Not tolerating a diet.  Doesn't want to take narcotics because they make him sleepy.  Does not want to go home today. Objective: Vital signs in last 24 hours: Temp:  [97 F (36.1 C)-98.8 F (37.1 C)] 97.6 F (36.4 C) (11/26 0738) Pulse Rate:  [60-89] 65 (11/26 0738) Resp:  [10-18] 18 (11/26 0738) BP: (101-125)/(55-81) 109/55 mmHg (11/26 0738) SpO2:  [90 %-100 %] 91 % (11/26 0738)   Intake/Output from previous day: 11/25 0701 - 11/26 0700 In: 3736.7 [P.O.:720; I.V.:2966.7; IV Piggyback:50] Out: 2650 [Urine:2625; Blood:25] Intake/Output this shift:     General appearance: alert and cooperative GI: distended  Incision: healing well, no significant drainage  Lab Results:   Recent Labs  07/15/14 1247  WBC 16.3*  HGB 14.6  HCT 42.4  PLT 272   BMET  Recent Labs  07/15/14 1247  CREATININE 0.79   PT/INR No results for input(s): LABPROT, INR in the last 72 hours. ABG No results for input(s): PHART, HCO3 in the last 72 hours.  Invalid input(s): PCO2, PO2  MEDS, Scheduled . heparin  5,000 Units Subcutaneous 3 times per day  . ketorolac  15 mg Intravenous 4 times per day    Studies/Results: No results found.  Assessment: s/p Procedure(s): LAPAROSCOPIC REPAIR INCARCARTED UMBILICAL  VENTRAL HERNIA, POSSIBLE OPEN Patient Active Problem List   Diagnosis Date Noted  . Incarcerated ventral hernia 07/15/2014  . Ventral hernia with obstruction 03/24/2014  . Obesity, unspecified 03/24/2014    Post operative pain  Plan: will add toradol and robaxin to help with pain control  Hopefully this will help him ambulate more and resolve his ileus    LOS: 1 day     .Rosario Adie, [facility], [place]   07/16/2014 8:56 AM

## 2014-07-17 ENCOUNTER — Encounter (HOSPITAL_COMMUNITY): Payer: Self-pay | Admitting: General Surgery

## 2014-07-17 DIAGNOSIS — K430 Incisional hernia with obstruction, without gangrene: Secondary | ICD-10-CM | POA: Diagnosis not present

## 2014-07-17 MED ORDER — OXYCODONE-ACETAMINOPHEN 5-325 MG PO TABS: 1.0000 | ORAL_TABLET | ORAL | Status: DC | PRN

## 2014-07-17 MED ORDER — DOCUSATE SODIUM 100 MG PO CAPS: 100.0000 mg | ORAL_CAPSULE | Freq: Two times a day (BID) | ORAL | Status: DC

## 2014-07-17 NOTE — Progress Notes (Signed)
Nurse reviewed discharge instructions with pt.  Pt verbalized understanding of discharge instructions, new medications and follow up appointment.  No concerns at time of discharge.

## 2014-07-17 NOTE — Discharge Summary (Signed)
Physician Discharge Summary  Patient ID: Steve Mills MRN: [id number] DOB/AGE: 1966/02/01 48 y.o.  Admit date: 07/15/2014 Discharge date: 07/17/2014  Admission Diagnoses: Incarcerated ventral hernia  Discharge Diagnoses:  Principal Problem:   Ventral hernia with obstruction Active Problems:   Incarcerated ventral hernia   Discharged Condition: good  Hospital Course: Patient admitted after surgery.  Discharged home once pain was controlled.    Consults: None  Significant Diagnostic Studies: labs: cbc, chemistry  Treatments: IV hydration, analgesia: acetaminophen w/ codeine and surgery: lap VHR  Discharge Exam: Blood pressure 127/80, pulse 57, temperature 98.5 F (36.9 C), temperature source Oral, resp. rate 18, height 6\' 1"  (1.854 m), weight 267 lb (121.11 kg), SpO2 97 %. General appearance: alert and cooperative GI: normal findings: soft, non-tender Incision/Wound: clean, dry, intact  Disposition: 01-Home or Self Care     Medication List    ASK your doctor about these medications        cetirizine 10 MG tablet  Commonly known as:  ZYRTEC  Take 10 mg by mouth daily as needed for allergies.           Follow-up Information    Follow up with Adin Hector, MD. Schedule an appointment as soon as possible for a visit in 2 weeks.   Specialty:  General Surgery   Contact information:   [address] [place] [address] [phone]       Signed: Rosario Adie [id number]/9574, 8:05 AM

## 2014-07-17 NOTE — Discharge Instructions (Signed)
[facility] _______[facility]  UMBILICAL OR INGUINAL HERNIA REPAIR: POST OP INSTRUCTIONS  Always review your discharge instruction sheet given to you by the facility where your surgery was performed. IF YOU HAVE DISABILITY OR FAMILY LEAVE FORMS, YOU MUST BRING THEM TO THE OFFICE FOR PROCESSING.   DO NOT GIVE THEM TO YOUR DOCTOR.  1. A  prescription for pain medication may be given to you upon discharge.  Take your pain medication as prescribed, if needed.  If narcotic pain medicine is not needed, then you may take acetaminophen (Tylenol) or ibuprofen (Advil) as needed. 2. Take your usually prescribed medications unless otherwise directed. 3. If you need a refill on your pain medication, please contact your pharmacy.  They will contact our office to request authorization. Prescriptions will not be filled after 5 pm or on week-ends. 4. You should follow a light diet the first 24 hours after arrival home, such as soup and crackers, etc.  Be sure to include lots of fluids daily.  Resume your normal diet the day after surgery. 5. Most patients will experience some swelling and bruising around the umbilicus or in the groin and scrotum.  Ice packs and reclining will help.  Swelling and bruising can take several days to resolve.  6. It is common to experience some constipation if taking pain medication after surgery.  Increasing fluid intake and taking a stool softener (such as Colace) will usually help or prevent this problem from occurring.  A mild laxative (Milk of Magnesia or Miralax) should be taken according to package directions if there are no bowel movements after 48 hours. 7. Unless discharge instructions indicate otherwise, you may remove your bandages 24-48 hours after surgery, and you may shower at that time.  You may have steri-strips (small skin tapes) in place directly over the incision.  These strips should be left on the skin for 7-10 days.  If your surgeon used skin glue on the  incision, you may shower in 24 hours.  The glue will flake off over the next 2-3 weeks.  Any sutures or staples will be removed at the office during your follow-up visit. 8. ACTIVITIES:  You may resume regular (light) daily activities beginning the next day--such as daily self-care, walking, climbing stairs--gradually increasing activities as tolerated.  You may have sexual intercourse when it is comfortable.  Refrain from any heavy lifting or straining until approved by your doctor. a. You may drive when you are no longer taking prescription pain medication, you can comfortably wear a seatbelt, and you can safely maneuver your car and apply brakes. b. RETURN TO WORK:  __________________________________________________________ 9. You should see your doctor in the office for a follow-up appointment approximately 2-3 weeks after your surgery.  Make sure that you call for this appointment within a day or two after you arrive home to insure a convenient appointment time. 10. OTHER INSTRUCTIONS:  __________________________________________________________________________________________________________________________________________________________________________________________  WHEN TO CALL YOUR DOCTOR: 1. Fever over 101.0 2. Inability to urinate 3. Nausea and/or vomiting 4. Extreme swelling or bruising 5. Continued bleeding from incision. 6. Increased pain, redness, or drainage from the incision  The clinic staff is available to answer your questions during regular business hours.  Please don't hesitate to call and ask to speak to one of the nurses for clinical concerns.  If you have a medical emergency, go to the nearest emergency room or call 911.  A surgeon from [facility] is always on call at the hospital     [address], [address], [place], [place]  [address] ?  [address] [address], [place], [place]   [address] [phone] ? [phone] ? FAX [phone] Web site:  [email or web address]  

## 2014-07-17 NOTE — Plan of Care (Signed)
Problem: Phase II Progression Outcomes Goal: Pain controlled Outcome: Completed/Met Date Met:  07/17/14 Goal: Progress activity as tolerated unless otherwise ordered Outcome: Completed/Met Date Met:  07/17/14 Goal: Tolerating diet Outcome: Completed/Met Date Met:  07/17/14

## 2014-07-20 LAB — GLUCOSE, CAPILLARY: Glucose-Capillary: 126 mg/dL — ABNORMAL HIGH (ref 70–99)

## 2014-09-10 ENCOUNTER — Other Ambulatory Visit (INDEPENDENT_AMBULATORY_CARE_PROVIDER_SITE_OTHER): Payer: Self-pay | Admitting: General Surgery

## 2014-09-10 ENCOUNTER — Other Ambulatory Visit (INDEPENDENT_AMBULATORY_CARE_PROVIDER_SITE_OTHER): Payer: Self-pay | Admitting: *Deleted

## 2014-09-10 DIAGNOSIS — Z9889 Other specified postprocedural states: Principal | ICD-10-CM

## 2014-09-10 DIAGNOSIS — Z8719 Personal history of other diseases of the digestive system: Secondary | ICD-10-CM

## 2014-09-10 NOTE — Addendum Note (Signed)
Addended by: Fatuma Dowers M on: 09/10/2014 12:34 PM   Modules accepted: Orders  

## 2014-09-14 ENCOUNTER — Other Ambulatory Visit (INDEPENDENT_AMBULATORY_CARE_PROVIDER_SITE_OTHER): Payer: Self-pay | Admitting: *Deleted

## 2014-09-14 DIAGNOSIS — Z9889 Other specified postprocedural states: Principal | ICD-10-CM

## 2014-09-14 DIAGNOSIS — Z8719 Personal history of other diseases of the digestive system: Secondary | ICD-10-CM

## 2014-09-17 ENCOUNTER — Ambulatory Visit
Admission: RE | Admit: 2014-09-17 | Discharge: 2014-09-17 | Disposition: A | Discharge status: Home / Self Care | Payer: Self-pay | Source: Ambulatory Visit | Attending: General Surgery | Admitting: General Surgery

## 2014-09-17 MED ORDER — IOHEXOL 300 MG/ML  SOLN
125.0000 mL | Freq: Once | INTRAMUSCULAR | Status: AC | PRN
  Administered 2014-09-17: 125 mL via INTRAVENOUS

## 2014-09-18 ENCOUNTER — Other Ambulatory Visit (INDEPENDENT_AMBULATORY_CARE_PROVIDER_SITE_OTHER): Payer: Self-pay | Admitting: *Deleted

## 2015-08-22 HISTORY — PX: NASAL POLYP SURGERY: SHX186

## 2016-10-10 DIAGNOSIS — R202 Paresthesia of skin: Secondary | ICD-10-CM | POA: Diagnosis not present

## 2016-10-10 DIAGNOSIS — M25559 Pain in unspecified hip: Secondary | ICD-10-CM | POA: Diagnosis not present

## 2016-10-13 ENCOUNTER — Encounter (INDEPENDENT_AMBULATORY_CARE_PROVIDER_SITE_OTHER): Payer: Self-pay | Admitting: Orthopedic Surgery

## 2016-10-13 ENCOUNTER — Encounter (INDEPENDENT_AMBULATORY_CARE_PROVIDER_SITE_OTHER): Payer: Self-pay

## 2016-10-13 ENCOUNTER — Ambulatory Visit (INDEPENDENT_AMBULATORY_CARE_PROVIDER_SITE_OTHER): Payer: BLUE CROSS/BLUE SHIELD | Admitting: Orthopedic Surgery

## 2016-10-13 ENCOUNTER — Ambulatory Visit (INDEPENDENT_AMBULATORY_CARE_PROVIDER_SITE_OTHER): Payer: Self-pay

## 2016-10-13 VITALS — Ht 73.0 in | Wt 285.0 lb

## 2016-10-13 DIAGNOSIS — M25551 Pain in right hip: Secondary | ICD-10-CM | POA: Diagnosis not present

## 2016-10-13 NOTE — Progress Notes (Signed)
Office Visit Note   Patient: Steve Mills           Date of Birth: 1965-10-04           MRN: [id number] Visit Date: 10/13/2016 Requested by: [name], MD [place], [place] [id number] PCP: Melinda Crutch, MD  Subjective: Chief Complaint  Patient presents with  . Right Hip - Pain    HPI Steve Mills is an active 51 year old patient with right hip pain for 3-4 years.  He jumped off a dump truck and his foot went into a hole and Janice hip into the socket from the ground reactive forces.  His pain is getting worse.  It does wake him up at night.  He describes pain in the groin if he drives for long distances but also reports some posterior buttock pain.  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  He is a previous smoker and currently does not take any medication other than over-the-counter medication for this problem.  Does not drink alcohol does not smoke.  He does not really report any radicular signs or symptoms but does describe some anterior thigh numbness which does not extend below the knee.              Review of Systems All systems reviewed are negative as they relate to the chief complaint within the history of present illness.  Patient denies  fevers or chills.    Assessment & Plan: Visit Diagnoses:  1. Pain in right hip     Plan: Impression is right hip pain which may be occult arthritis because his plain radiographs look pretty reasonable.  Also be referred pain from his back in terms of foraminal disc protrusion or narrowing.  This could also represent meralgia paresthetica.  This is waking him up about 5 nights out of the week.  It is interfering with his quality of life particularly at rest.  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  He has tried over-the-counter medication.  My recommendation would be MRI pelvis and back to really try to iron out where the pathology is.  Conceivably he may have a labral tear although he doesn't describe much in the way of mechanical symptoms.  He does localize the pain discretely to the groin.  I think hernia is also in the differential diagnosis but again less likely due to the absence of significant pain related to activity  Follow-Up Instructions: No Follow-up on file.   Orders:  Orders Placed This Encounter  Procedures  . XR HIP UNILAT W OR W/O PELVIS 2-3 VIEWS RIGHT   No orders of the defined types were placed in this encounter.     Procedures: No procedures performed   Clinical Data: No additional findings.  Objective: Vital Signs: Ht 6\' 1"  (1.854 m)   Wt 285 lb (129.3 kg)   BMI 37.60 kg/m   Physical Exam   Constitutional: Patient appears well-developed HEENT:  Head: Normocephalic Eyes:EOM are normal Neck: Normal range of motion Cardiovascular: Normal rate Pulmonary/chest: Effort normal Neurologic: Patient is alert Skin: Skin is warm Psychiatric: Patient has normal mood and affect    Ortho Exam patient has normal gait  and alignment.  Palpable pedal pulses.  No nerve root tension signs.  5 out of 5 ankle dorsiflexion plantar flexion quite hamstring strength.  No real groin pain or restriction of motion on the right versus left hip.  I don't detect any popping or grinding in the hip with labral loading.  Does have paresthesias anterior thigh.  He has somewhat of an overlying pannus which makes it possible that this could be compression of the lateral femoral cutaneous nerve.  Specialty Comments:  No specialty comments available.  Imaging: No results found.   PMFS History: Patient Active Problem List   Diagnosis Date Noted  . Pain in right hip 10/13/2016  . Incarcerated ventral hernia 07/15/2014  . Ventral hernia  with obstruction 03/24/2014  . Obesity, unspecified 03/24/2014   Past Medical History:  Diagnosis Date  . Family history of adverse reaction to anesthesia    mother allergic to ether   . Pneumonia    hx of 2014     No family history on file.  Past Surgical History:  Procedure Laterality Date  . APPENDECTOMY    . left wrist surgery      has plate in arm   . VENTRAL HERNIA REPAIR N/A 07/15/2014   Procedure: LAPAROSCOPIC REPAIR INCARCARTED UMBILICAL  VENTRAL HERNIA, ;  Surgeon: Fanny Skates, MD;  Location: [facility];  Service: General;  Laterality: N/A;  With MESH   Social History   Occupational History  . Not on file.   Social History Main Topics  . Smoking status: Former Smoker    Packs/day: 0.00    Quit date: 11/04/2013  . Smokeless tobacco: Never Used  . Alcohol use No     Comment: 3 beers per year   . Drug use: No  . Sexual activity: Not on file

## 2016-10-16 ENCOUNTER — Other Ambulatory Visit (INDEPENDENT_AMBULATORY_CARE_PROVIDER_SITE_OTHER): Payer: Self-pay | Admitting: Orthopedic Surgery

## 2016-10-16 DIAGNOSIS — Z77018 Contact with and (suspected) exposure to other hazardous metals: Secondary | ICD-10-CM

## 2016-10-25 ENCOUNTER — Ambulatory Visit
Admission: RE | Admit: 2016-10-25 | Discharge: 2016-10-25 | Disposition: A | Discharge status: Home / Self Care | Payer: BLUE CROSS/BLUE SHIELD | Source: Ambulatory Visit | Attending: Orthopedic Surgery | Admitting: Orthopedic Surgery

## 2016-10-25 ENCOUNTER — Other Ambulatory Visit: Payer: BLUE CROSS/BLUE SHIELD

## 2016-10-25 DIAGNOSIS — M25551 Pain in right hip: Secondary | ICD-10-CM

## 2016-10-25 DIAGNOSIS — M48061 Spinal stenosis, lumbar region without neurogenic claudication: Secondary | ICD-10-CM | POA: Diagnosis not present

## 2016-10-25 DIAGNOSIS — Z77018 Contact with and (suspected) exposure to other hazardous metals: Secondary | ICD-10-CM

## 2016-10-25 DIAGNOSIS — Z01818 Encounter for other preprocedural examination: Secondary | ICD-10-CM | POA: Diagnosis not present

## 2016-10-26 ENCOUNTER — Ambulatory Visit (INDEPENDENT_AMBULATORY_CARE_PROVIDER_SITE_OTHER): Payer: BLUE CROSS/BLUE SHIELD | Admitting: Orthopedic Surgery

## 2016-10-26 ENCOUNTER — Encounter (INDEPENDENT_AMBULATORY_CARE_PROVIDER_SITE_OTHER): Payer: Self-pay | Admitting: Orthopedic Surgery

## 2016-10-26 DIAGNOSIS — M25551 Pain in right hip: Secondary | ICD-10-CM

## 2016-10-27 ENCOUNTER — Ambulatory Visit (INDEPENDENT_AMBULATORY_CARE_PROVIDER_SITE_OTHER): Payer: BLUE CROSS/BLUE SHIELD | Admitting: Orthopedic Surgery

## 2016-10-29 NOTE — Progress Notes (Signed)
Office Visit Note   Patient: Steve Mills           Date of Birth: 1966-06-08           MRN: [id number] Visit Date: 10/26/2016 Requested by: [name], MD [place],  [id number] PCP: Melinda Crutch, MD  Subjective: Chief Complaint  Patient presents with  . Lower Back - Follow-up    HPI: Steve Mills is a 51 year old patient here to review his MRI scan.  He is having right hip pain and numbness in the anterior thigh.  MRI scan is reviewed with the patient.  Essentially it looks pretty reasonable.  No discrete right-sided lateralizing nerve compression is present.              ROS: All systems reviewed are negative as they relate to the chief complaint within the history of present illness.  Patient denies  fevers or chills.   Assessment & Plan: Visit Diagnoses:  1. 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  There is no groin pain with internal/external rotation of the leg and really any benign MRI scan of the back.  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  Should be a self-limited issue.  I'll see him back as needed  Follow-Up Instructions: Return if symptoms worsen or fail to improve.   Orders:  No orders of the defined types were placed in this encounter.  No orders of the defined types were placed in this encounter.     Procedures: No procedures performed   Clinical Data: No additional findings.  Objective: Vital Signs: There were no vitals taken for this visit.  Physical Exam:   Constitutional: Patient appears well-developed HEENT:  Head: Normocephalic Eyes:EOM are normal Neck: Normal range of motion Cardiovascular: Normal rate Pulmonary/chest: Effort normal Neurologic: Patient is alert Skin: Skin is warm Psychiatric: Patient has normal mood and affect    Ortho Exam: Examination of the  right and left hip demonstrate excellent hip strength with flexion extension abduction and adduction.  No groin pain with internal/external rotation of either leg.  Palpable pedal pulses.  Symmetric reflexes.  Does have paresthesias anterior lateral thigh on the right compared to the left but this does not extend below the knee.  Not much in way of pain with forward lateral bending and no focal trochanteric tenderness is noted.  Specialty Comments:  No specialty comments available.  Imaging: No results found.   PMFS History: Patient Active Problem List   Diagnosis Date Noted  . Pain in right hip 10/13/2016  . Incarcerated ventral hernia 07/15/2014  . Ventral hernia with obstruction 03/24/2014  . Obesity, unspecified 03/24/2014   Past Medical History:  Diagnosis Date  . Family history of adverse reaction to anesthesia    mother allergic to ether   . Pneumonia    hx of 2014     No family history on file.  Past Surgical History:  Procedure Laterality Date  . APPENDECTOMY    . left wrist surgery      has plate in arm   . VENTRAL HERNIA REPAIR N/A 07/15/2014   Procedure: LAPAROSCOPIC REPAIR INCARCARTED UMBILICAL  VENTRAL HERNIA, ;  Surgeon: Fanny Skates, MD;  Location: [facility];  Service: General;  Laterality: N/A;  With MESH   Social History  Occupational History  . Not on file.   Social History Main Topics  . Smoking status: Former Smoker    Packs/day: 0.00    Quit date: 11/04/2013  . Smokeless tobacco: Never Used  . Alcohol use No     Comment: 3 beers per year   . Drug use: No  . Sexual activity: Not on file

## 2016-11-06 DIAGNOSIS — R0982 Postnasal drip: Secondary | ICD-10-CM | POA: Diagnosis not present

## 2016-11-06 DIAGNOSIS — J029 Acute pharyngitis, unspecified: Secondary | ICD-10-CM | POA: Diagnosis not present

## 2016-11-06 DIAGNOSIS — R509 Fever, unspecified: Secondary | ICD-10-CM | POA: Diagnosis not present

## 2016-11-23 ENCOUNTER — Other Ambulatory Visit: Payer: Self-pay | Admitting: Family Medicine

## 2016-11-23 DIAGNOSIS — R9389 Abnormal findings on diagnostic imaging of other specified body structures: Secondary | ICD-10-CM

## 2016-11-23 DIAGNOSIS — Z0000 Encounter for general adult medical examination without abnormal findings: Secondary | ICD-10-CM | POA: Diagnosis not present

## 2016-11-27 ENCOUNTER — Ambulatory Visit
Admission: RE | Admit: 2016-11-27 | Discharge: 2016-11-27 | Disposition: A | Discharge status: Home / Self Care | Payer: BLUE CROSS/BLUE SHIELD | Source: Ambulatory Visit | Attending: Family Medicine | Admitting: Family Medicine

## 2016-11-27 DIAGNOSIS — R9389 Abnormal findings on diagnostic imaging of other specified body structures: Secondary | ICD-10-CM

## 2016-11-27 DIAGNOSIS — R911 Solitary pulmonary nodule: Secondary | ICD-10-CM | POA: Diagnosis not present

## 2016-11-27 MED ORDER — IOPAMIDOL (ISOVUE-300) INJECTION 61%
75.0000 mL | Freq: Once | INTRAVENOUS | Status: AC | PRN
  Administered 2016-11-27: 75 mL via INTRAVENOUS

## 2017-01-12 DIAGNOSIS — D126 Benign neoplasm of colon, unspecified: Secondary | ICD-10-CM | POA: Diagnosis not present

## 2017-01-12 DIAGNOSIS — Z1211 Encounter for screening for malignant neoplasm of colon: Secondary | ICD-10-CM | POA: Diagnosis not present

## 2017-01-18 DIAGNOSIS — Z1211 Encounter for screening for malignant neoplasm of colon: Secondary | ICD-10-CM | POA: Diagnosis not present

## 2017-01-18 DIAGNOSIS — D126 Benign neoplasm of colon, unspecified: Secondary | ICD-10-CM | POA: Diagnosis not present

## 2017-04-26 DIAGNOSIS — J301 Allergic rhinitis due to pollen: Secondary | ICD-10-CM | POA: Diagnosis not present

## 2017-04-26 DIAGNOSIS — J3089 Other allergic rhinitis: Secondary | ICD-10-CM | POA: Diagnosis not present

## 2017-12-17 DIAGNOSIS — Z0000 Encounter for general adult medical examination without abnormal findings: Secondary | ICD-10-CM | POA: Diagnosis not present

## 2018-07-11 ENCOUNTER — Emergency Department (HOSPITAL_COMMUNITY): Payer: BLUE CROSS/BLUE SHIELD

## 2018-07-11 ENCOUNTER — Observation Stay (HOSPITAL_COMMUNITY)
Admission: EM | Admit: 2018-07-11 | Discharge: 2018-07-13 | Disposition: A | Discharge status: Home / Self Care | Payer: BLUE CROSS/BLUE SHIELD | Attending: Surgery | Admitting: Surgery

## 2018-07-11 ENCOUNTER — Other Ambulatory Visit: Payer: Self-pay

## 2018-07-11 ENCOUNTER — Encounter (HOSPITAL_COMMUNITY): Payer: Self-pay

## 2018-07-11 DIAGNOSIS — K819 Cholecystitis, unspecified: Secondary | ICD-10-CM

## 2018-07-11 DIAGNOSIS — K8010 Calculus of gallbladder with chronic cholecystitis without obstruction: Principal | ICD-10-CM | POA: Insufficient documentation

## 2018-07-11 DIAGNOSIS — R1011 Right upper quadrant pain: Secondary | ICD-10-CM | POA: Diagnosis not present

## 2018-07-11 DIAGNOSIS — R140 Abdominal distension (gaseous): Secondary | ICD-10-CM

## 2018-07-11 DIAGNOSIS — Z87891 Personal history of nicotine dependence: Secondary | ICD-10-CM | POA: Diagnosis not present

## 2018-07-11 DIAGNOSIS — R1010 Upper abdominal pain, unspecified: Secondary | ICD-10-CM | POA: Diagnosis not present

## 2018-07-11 DIAGNOSIS — S3991XA Unspecified injury of abdomen, initial encounter: Secondary | ICD-10-CM | POA: Diagnosis not present

## 2018-07-11 DIAGNOSIS — Z6834 Body mass index (BMI) 34.0-34.9, adult: Secondary | ICD-10-CM | POA: Diagnosis not present

## 2018-07-11 DIAGNOSIS — R109 Unspecified abdominal pain: Secondary | ICD-10-CM | POA: Diagnosis not present

## 2018-07-11 DIAGNOSIS — R52 Pain, unspecified: Secondary | ICD-10-CM

## 2018-07-11 DIAGNOSIS — E669 Obesity, unspecified: Secondary | ICD-10-CM | POA: Diagnosis not present

## 2018-07-11 DIAGNOSIS — Z9049 Acquired absence of other specified parts of digestive tract: Secondary | ICD-10-CM

## 2018-07-11 DIAGNOSIS — Z419 Encounter for procedure for purposes other than remedying health state, unspecified: Secondary | ICD-10-CM

## 2018-07-11 LAB — URINALYSIS, ROUTINE W REFLEX MICROSCOPIC
BILIRUBIN URINE: NEGATIVE
Glucose, UA: NEGATIVE mg/dL
Hgb urine dipstick: NEGATIVE
KETONES UR: NEGATIVE mg/dL
Leukocytes, UA: NEGATIVE
NITRITE: NEGATIVE
PROTEIN: NEGATIVE mg/dL
Specific Gravity, Urine: 1.021 (ref 1.005–1.030)
pH: 7 (ref 5.0–8.0)

## 2018-07-11 LAB — LIPASE, BLOOD: Lipase: 29 U/L (ref 11–51)

## 2018-07-11 LAB — CBC
HCT: 42.4 % (ref 39.0–52.0)
Hemoglobin: 14.3 g/dL (ref 13.0–17.0)
MCH: 29.5 pg (ref 26.0–34.0)
MCHC: 33.7 g/dL (ref 30.0–36.0)
MCV: 87.6 fL (ref 80.0–100.0)
NRBC: 0 % (ref 0.0–0.2)
Platelets: 302 10*3/uL (ref 150–400)
RBC: 4.84 MIL/uL (ref 4.22–5.81)
RDW: 12.6 % (ref 11.5–15.5)
WBC: 14.2 10*3/uL — AB (ref 4.0–10.5)

## 2018-07-11 LAB — COMPREHENSIVE METABOLIC PANEL
ALT: 18 U/L (ref 0–44)
AST: 21 U/L (ref 15–41)
Albumin: 4.4 g/dL (ref 3.5–5.0)
Alkaline Phosphatase: 58 U/L (ref 38–126)
Anion gap: 8 (ref 5–15)
BILIRUBIN TOTAL: 0.7 mg/dL (ref 0.3–1.2)
BUN: 15 mg/dL (ref 6–20)
CO2: 26 mmol/L (ref 22–32)
CREATININE: 0.81 mg/dL (ref 0.61–1.24)
Calcium: 8.9 mg/dL (ref 8.9–10.3)
Chloride: 105 mmol/L (ref 98–111)
Glucose, Bld: 102 mg/dL — ABNORMAL HIGH (ref 70–99)
POTASSIUM: 3.7 mmol/L (ref 3.5–5.1)
Sodium: 139 mmol/L (ref 135–145)
TOTAL PROTEIN: 7.2 g/dL (ref 6.5–8.1)

## 2018-07-11 MED ORDER — ONDANSETRON 4 MG PO TBDP: 4.0000 mg | ORAL_TABLET | Freq: Four times a day (QID) | ORAL | Status: DC | PRN

## 2018-07-11 MED ORDER — IOPAMIDOL (ISOVUE-300) INJECTION 61%
INTRAVENOUS | Status: AC
  Filled 2018-07-11: qty 100

## 2018-07-11 MED ORDER — HYDROMORPHONE HCL 1 MG/ML IJ SOLN
0.5000 mg | Freq: Once | INTRAMUSCULAR | Status: AC
  Administered 2018-07-11: 0.5 mg via INTRAVENOUS
  Filled 2018-07-11: qty 1

## 2018-07-11 MED ORDER — IOPAMIDOL (ISOVUE-300) INJECTION 61%
100.0000 mL | Freq: Once | INTRAVENOUS | Status: AC | PRN
  Administered 2018-07-11: 100 mL via INTRAVENOUS

## 2018-07-11 MED ORDER — KCL IN DEXTROSE-NACL 20-5-0.9 MEQ/L-%-% IV SOLN
INTRAVENOUS | Status: DC
  Administered 2018-07-12: via INTRAVENOUS
  Filled 2018-07-11 (×2): qty 1000

## 2018-07-11 MED ORDER — SODIUM CHLORIDE (PF) 0.9 % IJ SOLN
INTRAMUSCULAR | Status: AC
  Filled 2018-07-11: qty 50

## 2018-07-11 MED ORDER — SODIUM CHLORIDE 0.9 % IV SOLN: 2.0000 g | INTRAVENOUS | Status: DC

## 2018-07-11 MED ORDER — ONDANSETRON HCL 4 MG/2ML IJ SOLN: 4.0000 mg | Freq: Four times a day (QID) | INTRAMUSCULAR | Status: DC | PRN

## 2018-07-11 MED ORDER — MORPHINE SULFATE (PF) 2 MG/ML IV SOLN
1.0000 mg | INTRAVENOUS | Status: DC | PRN
  Administered 2018-07-12 (×4): 2 mg via INTRAVENOUS
  Filled 2018-07-11 (×4): qty 1

## 2018-07-11 MED ORDER — ONDANSETRON HCL 4 MG/2ML IJ SOLN
4.0000 mg | Freq: Once | INTRAMUSCULAR | Status: AC
  Administered 2018-07-11: 4 mg via INTRAVENOUS
  Filled 2018-07-11: qty 2

## 2018-07-11 MED ORDER — SODIUM CHLORIDE 0.9 % IV SOLN
2.0000 g | INTRAVENOUS | Status: DC
  Administered 2018-07-12: 2 g via INTRAVENOUS
  Filled 2018-07-11: qty 2

## 2018-07-11 MED ORDER — PANTOPRAZOLE SODIUM 40 MG IV SOLR
40.0000 mg | Freq: Every day | INTRAVENOUS | Status: DC
  Administered 2018-07-12: 40 mg via INTRAVENOUS
  Filled 2018-07-11: qty 40
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
## 2018-07-11 NOTE — ED Notes (Signed)
US at bedside

## 2018-07-12 ENCOUNTER — Observation Stay (HOSPITAL_COMMUNITY): Payer: BLUE CROSS/BLUE SHIELD | Admitting: Anesthesiology

## 2018-07-12 ENCOUNTER — Encounter (HOSPITAL_COMMUNITY): Payer: Self-pay | Admitting: Registered Nurse

## 2018-07-12 ENCOUNTER — Observation Stay (HOSPITAL_COMMUNITY): Payer: BLUE CROSS/BLUE SHIELD

## 2018-07-12 ENCOUNTER — Encounter (HOSPITAL_COMMUNITY)
Admission: EM | Disposition: A | Discharge status: Home / Self Care | Payer: Self-pay | Source: Home / Self Care | Attending: Emergency Medicine

## 2018-07-12 DIAGNOSIS — K915 Postcholecystectomy syndrome: Secondary | ICD-10-CM | POA: Diagnosis not present

## 2018-07-12 DIAGNOSIS — R109 Unspecified abdominal pain: Secondary | ICD-10-CM | POA: Diagnosis not present

## 2018-07-12 DIAGNOSIS — Z87891 Personal history of nicotine dependence: Secondary | ICD-10-CM | POA: Diagnosis not present

## 2018-07-12 DIAGNOSIS — K8066 Calculus of gallbladder and bile duct with acute and chronic cholecystitis without obstruction: Secondary | ICD-10-CM | POA: Diagnosis not present

## 2018-07-12 DIAGNOSIS — K8010 Calculus of gallbladder with chronic cholecystitis without obstruction: Secondary | ICD-10-CM | POA: Diagnosis not present

## 2018-07-12 DIAGNOSIS — Z6834 Body mass index (BMI) 34.0-34.9, adult: Secondary | ICD-10-CM | POA: Diagnosis not present

## 2018-07-12 DIAGNOSIS — K8000 Calculus of gallbladder with acute cholecystitis without obstruction: Secondary | ICD-10-CM | POA: Diagnosis not present

## 2018-07-12 DIAGNOSIS — K8012 Calculus of gallbladder with acute and chronic cholecystitis without obstruction: Secondary | ICD-10-CM | POA: Diagnosis not present

## 2018-07-12 DIAGNOSIS — Z9049 Acquired absence of other specified parts of digestive tract: Secondary | ICD-10-CM

## 2018-07-12 DIAGNOSIS — E669 Obesity, unspecified: Secondary | ICD-10-CM | POA: Diagnosis not present

## 2018-07-12 HISTORY — PX: CHOLECYSTECTOMY: SHX55

## 2018-07-12 LAB — HIV ANTIBODY (ROUTINE TESTING W REFLEX): HIV Screen 4th Generation wRfx: NONREACTIVE

## 2018-07-12 LAB — CBC
HCT: 43 % (ref 39.0–52.0)
Hemoglobin: 13.8 g/dL (ref 13.0–17.0)
MCH: 30 pg (ref 26.0–34.0)
MCHC: 32.1 g/dL (ref 30.0–36.0)
MCV: 93.5 fL (ref 80.0–100.0)
Platelets: 281 10*3/uL (ref 150–400)
RBC: 4.6 MIL/uL (ref 4.22–5.81)
RDW: 13 % (ref 11.5–15.5)
WBC: 12.6 10*3/uL — ABNORMAL HIGH (ref 4.0–10.5)
nRBC: 0 % (ref 0.0–0.2)

## 2018-07-12 LAB — CREATININE, SERUM
Creatinine, Ser: 0.86 mg/dL (ref 0.61–1.24)
GFR calc Af Amer: >60 mL/min (ref >60)

## 2018-07-12 SURGERY — LAPAROSCOPIC CHOLECYSTECTOMY WITH INTRAOPERATIVE CHOLANGIOGRAM
Anesthesia: General

## 2018-07-12 MED ORDER — FENTANYL CITRATE (PF) 250 MCG/5ML IJ SOLN
INTRAMUSCULAR | Status: AC
  Filled 2018-07-12: qty 5

## 2018-07-12 MED ORDER — CEFAZOLIN SODIUM-DEXTROSE 2-4 GM/100ML-% IV SOLN
INTRAVENOUS | Status: AC
  Filled 2018-07-12: qty 100

## 2018-07-12 MED ORDER — PROPOFOL 10 MG/ML IV BOLUS
INTRAVENOUS | Status: AC
  Filled 2018-07-12: qty 20

## 2018-07-12 MED ORDER — HEPARIN SODIUM (PORCINE) 5000 UNIT/ML IJ SOLN
5000.0000 [IU] | Freq: Once | INTRAMUSCULAR | Status: AC
  Administered 2018-07-12: 5000 [IU] via SUBCUTANEOUS

## 2018-07-12 MED ORDER — PROMETHAZINE HCL 25 MG/ML IJ SOLN: 6.2500 mg | INTRAMUSCULAR | Status: DC | PRN

## 2018-07-12 MED ORDER — ROCURONIUM BROMIDE 10 MG/ML (PF) SYRINGE
PREFILLED_SYRINGE | INTRAVENOUS | Status: DC | PRN
  Administered 2018-07-12: 70 mg via INTRAVENOUS

## 2018-07-12 MED ORDER — ALBUTEROL SULFATE HFA 108 (90 BASE) MCG/ACT IN AERS
INHALATION_SPRAY | RESPIRATORY_TRACT | Status: AC
  Filled 2018-07-12: qty 6.7

## 2018-07-12 MED ORDER — ONDANSETRON HCL 4 MG/2ML IJ SOLN: 4.0000 mg | Freq: Four times a day (QID) | INTRAMUSCULAR | Status: DC | PRN

## 2018-07-12 MED ORDER — LACTATED RINGERS IV SOLN
INTRAVENOUS | Status: DC | PRN
  Administered 2018-07-12 (×2): via INTRAVENOUS

## 2018-07-12 MED ORDER — ONDANSETRON 4 MG PO TBDP: 4.0000 mg | ORAL_TABLET | Freq: Four times a day (QID) | ORAL | Status: DC | PRN

## 2018-07-12 MED ORDER — MIDAZOLAM HCL 2 MG/2ML IJ SOLN
INTRAMUSCULAR | Status: AC
  Filled 2018-07-12: qty 2

## 2018-07-12 MED ORDER — MORPHINE SULFATE (PF) 2 MG/ML IV SOLN: 1.0000 mg | INTRAVENOUS | Status: DC | PRN

## 2018-07-12 MED ORDER — ACETAMINOPHEN 10 MG/ML IV SOLN: 1000.0000 mg | Freq: Once | INTRAVENOUS | Status: DC | PRN

## 2018-07-12 MED ORDER — DEXAMETHASONE SODIUM PHOSPHATE 10 MG/ML IJ SOLN
INTRAMUSCULAR | Status: DC | PRN
  Administered 2018-07-12: 10 mg via INTRAVENOUS

## 2018-07-12 MED ORDER — CHLORHEXIDINE GLUCONATE CLOTH 2 % EX PADS: 6.0000 | MEDICATED_PAD | Freq: Once | CUTANEOUS | Status: DC

## 2018-07-12 MED ORDER — RINGERS IRRIGATION IR SOLN
Status: DC | PRN
  Administered 2018-07-12: 1000 mL

## 2018-07-12 MED ORDER — PROPOFOL 10 MG/ML IV BOLUS
INTRAVENOUS | Status: DC | PRN
  Administered 2018-07-12: 200 mg via INTRAVENOUS

## 2018-07-12 MED ORDER — ONDANSETRON HCL 4 MG/2ML IJ SOLN
INTRAMUSCULAR | Status: AC
  Filled 2018-07-12: qty 2

## 2018-07-12 MED ORDER — OXYCODONE HCL 5 MG/5ML PO SOLN: 5.0000 mg | Freq: Once | ORAL | Status: DC | PRN

## 2018-07-12 MED ORDER — SUGAMMADEX SODIUM 200 MG/2ML IV SOLN
INTRAVENOUS | Status: DC | PRN
  Administered 2018-07-12: 300 mg via INTRAVENOUS

## 2018-07-12 MED ORDER — BUPIVACAINE LIPOSOME 1.3 % IJ SUSP
20.0000 mL | Freq: Once | INTRAMUSCULAR | Status: DC
  Filled 2018-07-12: qty 20

## 2018-07-12 MED ORDER — PANTOPRAZOLE SODIUM 40 MG IV SOLR
40.0000 mg | Freq: Every day | INTRAVENOUS | Status: DC
  Administered 2018-07-12: 40 mg via INTRAVENOUS
  Filled 2018-07-12: qty 40

## 2018-07-12 MED ORDER — SODIUM CHLORIDE 0.9 % IV SOLN
2.0000 g | INTRAVENOUS | Status: DC
  Administered 2018-07-12: 2 g via INTRAVENOUS
  Filled 2018-07-12: qty 2

## 2018-07-12 MED ORDER — LIDOCAINE 2% (20 MG/ML) 5 ML SYRINGE
INTRAMUSCULAR | Status: DC | PRN
  Administered 2018-07-12: 100 mg via INTRAVENOUS

## 2018-07-12 MED ORDER — MIDAZOLAM HCL 5 MG/5ML IJ SOLN
INTRAMUSCULAR | Status: DC | PRN
  Administered 2018-07-12: 2 mg via INTRAVENOUS

## 2018-07-12 MED ORDER — HEPARIN SODIUM (PORCINE) 5000 UNIT/ML IJ SOLN
5000.0000 [IU] | Freq: Three times a day (TID) | INTRAMUSCULAR | Status: DC
  Administered 2018-07-12 – 2018-07-13 (×2): 5000 [IU] via SUBCUTANEOUS
  Filled 2018-07-12 (×2): qty 1

## 2018-07-12 MED ORDER — HYDROCODONE-ACETAMINOPHEN 5-325 MG PO TABS: 1.0000 | ORAL_TABLET | ORAL | Status: DC | PRN

## 2018-07-12 MED ORDER — ONDANSETRON HCL 4 MG/2ML IJ SOLN
INTRAMUSCULAR | Status: DC | PRN
  Administered 2018-07-12: 4 mg via INTRAVENOUS

## 2018-07-12 MED ORDER — PROPOFOL 10 MG/ML IV BOLUS
INTRAVENOUS | Status: AC
  Filled 2018-07-12: qty 40

## 2018-07-12 MED ORDER — HEPARIN SODIUM (PORCINE) 5000 UNIT/ML IJ SOLN
INTRAMUSCULAR | Status: AC
  Administered 2018-07-12: 5000 [IU] via SUBCUTANEOUS
  Filled 2018-07-12: qty 1

## 2018-07-12 MED ORDER — ROCURONIUM BROMIDE 100 MG/10ML IV SOLN
INTRAVENOUS | Status: AC
  Filled 2018-07-12: qty 1

## 2018-07-12 MED ORDER — IOPAMIDOL (ISOVUE-300) INJECTION 61%
INTRAVENOUS | Status: DC | PRN
  Administered 2018-07-12: 5 mL

## 2018-07-12 MED ORDER — KETAMINE HCL 10 MG/ML IJ SOLN
INTRAMUSCULAR | Status: DC | PRN
  Administered 2018-07-12: 30 mg via INTRAVENOUS

## 2018-07-12 MED ORDER — IOPAMIDOL (ISOVUE-300) INJECTION 61%
INTRAVENOUS | Status: AC
  Filled 2018-07-12: qty 50

## 2018-07-12 MED ORDER — OXYCODONE HCL 5 MG PO TABS: 5.0000 mg | ORAL_TABLET | Freq: Once | ORAL | Status: DC | PRN

## 2018-07-12 MED ORDER — HYDRALAZINE HCL 20 MG/ML IJ SOLN: 10.0000 mg | INTRAMUSCULAR | Status: DC | PRN

## 2018-07-12 MED ORDER — CEFAZOLIN SODIUM-DEXTROSE 2-4 GM/100ML-% IV SOLN
2.0000 g | INTRAVENOUS | Status: AC
  Administered 2018-07-12: 2 g via INTRAVENOUS

## 2018-07-12 MED ORDER — DEXAMETHASONE SODIUM PHOSPHATE 10 MG/ML IJ SOLN
INTRAMUSCULAR | Status: AC
  Filled 2018-07-12: qty 1

## 2018-07-12 MED ORDER — KCL IN DEXTROSE-NACL 20-5-0.45 MEQ/L-%-% IV SOLN
INTRAVENOUS | Status: DC
  Administered 2018-07-12 – 2018-07-13 (×2): via INTRAVENOUS
  Filled 2018-07-12 (×2): qty 1000

## 2018-07-12 MED ORDER — FENTANYL CITRATE (PF) 100 MCG/2ML IJ SOLN: 25.0000 ug | INTRAMUSCULAR | Status: DC | PRN

## 2018-07-12 MED ORDER — FENTANYL CITRATE (PF) 100 MCG/2ML IJ SOLN
INTRAMUSCULAR | Status: DC | PRN
  Administered 2018-07-12: 100 ug via INTRAVENOUS
  Administered 2018-07-12 (×2): 50 ug via INTRAVENOUS
  Administered 2018-07-12: 100 ug via INTRAVENOUS

## 2018-07-12 SURGICAL SUPPLY — 39 items
APPLICATOR COTTON TIP 6 STRL (MISCELLANEOUS) IMPLANT
APPLICATOR COTTON TIP 6IN STRL (MISCELLANEOUS)
APPLIER CLIP 5 13 M/L LIGAMAX5 (MISCELLANEOUS) ×2
APPLIER CLIP ROT 10 11.4 M/L (STAPLE) ×2
BENZOIN TINCTURE PRP APPL 2/3 (GAUZE/BANDAGES/DRESSINGS) IMPLANT
CABLE HIGH FREQUENCY MONO STRZ (ELECTRODE) IMPLANT
CATH REDDICK CHOLANGI 4FR 50CM (CATHETERS) ×2 IMPLANT
CLIP APPLIE 5 13 M/L LIGAMAX5 (MISCELLANEOUS) ×1 IMPLANT
CLIP APPLIE ROT 10 11.4 M/L (STAPLE) ×1 IMPLANT
COVER MAYO STAND STRL (DRAPES) ×2 IMPLANT
COVER SURGICAL LIGHT HANDLE (MISCELLANEOUS) ×2 IMPLANT
COVER WAND RF STERILE (DRAPES) ×2 IMPLANT
DECANTER SPIKE VIAL GLASS SM (MISCELLANEOUS) IMPLANT
DERMABOND ADVANCED (GAUZE/BANDAGES/DRESSINGS) ×1
DERMABOND ADVANCED .7 DNX12 (GAUZE/BANDAGES/DRESSINGS) ×1 IMPLANT
DRAPE C-ARM 42X120 X-RAY (DRAPES) ×2 IMPLANT
ELECT PENCIL ROCKER SW 15FT (MISCELLANEOUS) ×2 IMPLANT
ELECT REM PT RETURN 15FT ADLT (MISCELLANEOUS) ×2 IMPLANT
GLOVE BIOGEL M 8.0 STRL (GLOVE) ×12 IMPLANT
GOWN STRL REUS W/TWL XL LVL3 (GOWN DISPOSABLE) ×8 IMPLANT
HEMOSTAT SURGICEL 4X8 (HEMOSTASIS) IMPLANT
IV CATH 14GX2 1/4 (CATHETERS) ×2 IMPLANT
KIT BASIN OR (CUSTOM PROCEDURE TRAY) ×2 IMPLANT
L-HOOK LAP DISP 36CM (ELECTROSURGICAL) ×2
LHOOK LAP DISP 36CM (ELECTROSURGICAL) ×1 IMPLANT
POUCH RETRIEVAL ECOSAC 10 (ENDOMECHANICALS) ×1 IMPLANT
POUCH RETRIEVAL ECOSAC 10MM (ENDOMECHANICALS) ×1
SCISSORS LAP 5X45 EPIX DISP (ENDOMECHANICALS) ×2 IMPLANT
SET IRRIG TUBING LAPAROSCOPIC (IRRIGATION / IRRIGATOR) ×2 IMPLANT
SLEEVE XCEL OPT CAN 5 100 (ENDOMECHANICALS) ×2 IMPLANT
STRIP CLOSURE SKIN 1/2X4 (GAUZE/BANDAGES/DRESSINGS) IMPLANT
SUT MNCRL AB 4-0 PS2 18 (SUTURE) ×2 IMPLANT
SYR 20CC LL (SYRINGE) ×2 IMPLANT
TOWEL OR 17X26 10 PK STRL BLUE (TOWEL DISPOSABLE) ×2 IMPLANT
TRAY LAPAROSCOPIC (CUSTOM PROCEDURE TRAY) ×2 IMPLANT
TROCAR BLADELESS OPT 5 100 (ENDOMECHANICALS) ×2 IMPLANT
TROCAR XCEL BLUNT TIP 100MML (ENDOMECHANICALS) ×2 IMPLANT
TROCAR XCEL NON-BLD 11X100MML (ENDOMECHANICALS) IMPLANT
TUBING INSUF HEATED (TUBING) ×2 IMPLANT

## 2018-07-12 NOTE — Anesthesia Procedure Notes (Signed)
Procedure Name: Intubation Date/Time: 07/12/2018 11:30 AM Performed by: Lissa Morales, CRNA Pre-anesthesia Checklist: Patient identified, Emergency Drugs available, Suction available and Patient being monitored Patient Re-evaluated:Patient Re-evaluated prior to induction Oxygen Delivery Method: Circle system utilized Preoxygenation: Pre-oxygenation with 100% oxygen Induction Type: IV induction Ventilation: Mask ventilation without difficulty Laryngoscope Size: Mac and 4 Grade View: Grade II Tube type: Oral Tube size: 8.0 mm Number of attempts: 1 Airway Equipment and Method: Stylet and Oral airway Placement Confirmation: ETT inserted through vocal cords under direct vision,  positive ETCO2 and breath sounds checked- equal and bilateral Secured at: 23 cm Tube secured with: Tape Dental Injury: Teeth and Oropharynx as per pre-operative assessment

## 2018-07-12 NOTE — Transfer of Care (Signed)
Immediate Anesthesia Transfer of Care Note  Patient: Steve Mills  Procedure(s) Performed: LAPAROSCOPIC CHOLECYSTECTOMY WITH INTRAOPERATIVE CHOLANGIOGRAM (N/A )  Patient Location: PACU  Anesthesia Type:General  Level of Consciousness: awake, alert , oriented and patient cooperative  Airway & Oxygen Therapy: Patient Spontanous Breathing and Patient connected to face mask oxygen  Post-op Assessment: Report given to RN, Post -op Vital signs reviewed and stable and Patient moving all extremities X 4  Post vital signs: stable  Last Vitals:  Vitals Value Taken Time  BP 127/79 07/12/2018 12:54 PM  Temp    Pulse 63 07/12/2018 12:57 PM  Resp 11 07/12/2018 12:57 PM  SpO2 99 % 07/12/2018 12:57 PM  Vitals shown include unvalidated device data.  Last Pain:  Vitals:   07/12/18 0830  TempSrc:   PainSc: Asleep      Patients Stated Pain Goal: 4 (27/25/36 6440)  Complications: No apparent anesthesia complications

## 2018-07-12 NOTE — Anesthesia Preprocedure Evaluation (Addendum)
Anesthesia Evaluation  Patient identified by MRN, date of birth, ID band Patient awake    Reviewed: Allergy & Precautions, NPO status , Patient's Chart, lab work & pertinent test results  History of Anesthesia Complications Negative for: history of anesthetic complications  Airway Mallampati: II  TM Distance: >3 FB Neck ROM: Full    Dental no notable dental hx. (+) Teeth Intact, Dental Advisory Given   Pulmonary former smoker,    Pulmonary exam normal breath sounds clear to auscultation       Cardiovascular negative cardio ROS Normal cardiovascular exam Rhythm:Regular Rate:Normal     Neuro/Psych negative neurological ROS     GI/Hepatic negative GI ROS, Neg liver ROS,   Endo/Other  Obese, BMI 34.6  Renal/GU negative Renal ROS     Musculoskeletal negative musculoskeletal ROS (+)   Abdominal   Peds  Hematology negative hematology ROS (+)   Anesthesia Other Findings Day of surgery medications reviewed with the patient.  Reproductive/Obstetrics                            Anesthesia Physical Anesthesia Plan  ASA: II  Anesthesia Plan: General   Post-op Pain Management:    Induction: Intravenous  PONV Risk Score and Plan: 2 and Treatment may vary due to age or medical condition, Ondansetron and Dexamethasone  Airway Management Planned: Oral ETT  Additional Equipment:   Intra-op Plan:   Post-operative Plan: Extubation in OR  Informed Consent: I have reviewed the patients History and Physical, chart, labs and discussed the procedure including the risks, benefits and alternatives for the proposed anesthesia with the patient or authorized representative who has indicated his/her understanding and acceptance.   Dental advisory given  Plan Discussed with: CRNA  Anesthesia Plan Comments:        Anesthesia Quick Evaluation
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
## 2018-07-12 NOTE — Anesthesia Postprocedure Evaluation (Signed)
Anesthesia Post Note  Patient: Steve Mills  Procedure(s) Performed: LAPAROSCOPIC CHOLECYSTECTOMY WITH INTRAOPERATIVE CHOLANGIOGRAM (N/A )     Patient location during evaluation: PACU Anesthesia Type: General Level of consciousness: awake and alert Pain management: pain level controlled Vital Signs Assessment: post-procedure vital signs reviewed and stable Respiratory status: spontaneous breathing, nonlabored ventilation and respiratory function stable Cardiovascular status: blood pressure returned to baseline and stable Postop Assessment: no apparent nausea or vomiting Anesthetic complications: no    Last Vitals:  Vitals:   07/12/18 1315 07/12/18 1330  BP: 118/76 122/78  Pulse: 67 70  Resp: 14 14  Temp:    SpO2: 95% 96%    Last Pain:  Vitals:   07/12/18 1330  TempSrc:   PainSc: 0-No pain                 Brennan Bailey

## 2018-07-12 NOTE — Plan of Care (Signed)
Plan of care reviewed with pt.

## 2018-07-12 NOTE — Op Note (Signed)
Steve Mills  Primary Care Physician:  [name], MD    07/12/2018  12:48 PM  Procedure: Laparoscopic Cholecystectomy with intraoperative cholangiogram  Surgeon: Catalina Antigua B. Hassell Done, MD, FACS Asst:  Ralene Ok, MD, FACS  Anes:  General  Drains:  None  Findings: Acute cholecystitis with multiple small stones and normal IOC  Description of Procedure: The patient was taken to OR 4 and given general anesthesia.  The patient was prepped with PCMX and draped sterilely. A time out was performed.  Access to the abdomen was achieved with a 5 mm Optiview through the right upper quadrant.  Port placement included three 5 mm trocars and one 11 mm trocar.    The gallbladder was visualized and the fundus was grasped and the gallbladder was elevated. Traction on the infundibulum allowed for successful demonstration of the critical view. Inflammatory changes were acute and edematous.  The cystic duct was identified and clipped up on the gallbladder and an incision was made in the cystic duct and the Reddick catheter was inserted after milking the cystic duct of any debris. A dynamic cholangiogram was performed which demonstrated a long and skinny cystic and common bile duct.    The cystic duct was then triple clipped and divided, the cystic artery was double clipped and divided and then the gallbladder was removed from the gallbladder bed. Removal of the gallbladder from the gallbladder bed was performed without spillage.  The gallbladder was then placed in a bag and brought out through one of the trocar sites. The gallbladder bed was inspected and no bleeding or bile leaks were seen.   Laparoscopic visualization was used when closing fascial defects for trocar sites.   Incisions were injected with Exparel and closed with 4-0 Monocryl and Dermabond on the skin.  Sponge and needle count were correct.    The patient was taken to the recovery room in satisfactory condition.

## 2018-07-13 ENCOUNTER — Encounter (HOSPITAL_COMMUNITY): Payer: Self-pay | Admitting: Surgery

## 2018-07-13 MED ORDER — TRAMADOL HCL 50 MG PO TABS: 50.0000 mg | ORAL_TABLET | Freq: Four times a day (QID) | ORAL | 0 refills | Status: AC | PRN
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
## 2018-07-13 NOTE — Plan of Care (Signed)
Reviewed discharge instructions with patient; copy given. IV removed. Patient ready for discharge.  

## 2018-07-13 NOTE — Discharge Instructions (Signed)
[facility] LAPAROSCOPIC [facility]ERY: POST OP INSTRUCTIONS Always review your discharge instruction sheet given to you by the facility where your surgery was performed. IF YOU HAVE DISABILITY OR FAMILY LEAVE FORMS, YOU MUST BRING THEM TO THE OFFICE FOR PROCESSING.   DO NOT GIVE THEM TO YOUR DOCTOR.  PAIN CONTROL  1. See pain control handout  2. A prescription for pain medication may be given to you upon discharge.  Take your pain medication as prescribed, if you still have uncontrolled pain after taking acetaminophen (Tylenol) or ibuprofen (Advil). 3. Use ice packs to help control pain. 4. If you need a refill on your pain medication, please contact your pharmacy.  They will contact our office to request authorization. Prescriptions will not be filled after 5pm or on week-ends.  HOME MEDICATIONS 5. Take your usually prescribed medications unless otherwise directed.  DIET 6. You should follow a light diet the first few days after arrival home.  Be sure to include lots of fluids daily. Avoid fatty, fried foods.   CONSTIPATION 7. It is common to experience some constipation after surgery and if you are taking pain medication.  Increasing fluid intake and taking a stool softener (such as Colace) will usually help or prevent this problem from occurring.  A mild laxative (Milk of Magnesia or Miralax) should be taken according to package instructions if there are no bowel movements after 48 hours.  WOUND/INCISION CARE 8. Most patients will experience some swelling and bruising in the area of the incisions.  Ice packs will help.  Swelling and bruising can take several days to resolve.  9. Unless discharge instructions indicate otherwise, follow guidelines below  a. STERI-STRIPS - you may remove your outer bandages 48 hours after surgery, and you may shower at that time.  You have steri-strips (small skin tapes) in place directly over the incision.  These strips should be left on  the skin for 7-10 days.   b. DERMABOND/SKIN GLUE - you may shower in 24 hours.  The glue will flake off over the next 2-3 weeks. 10. Any sutures or staples will be removed at the office during your follow-up visit.  ACTIVITIES 11. You may resume regular (light) daily activities beginning the next day--such as daily self-care, walking, climbing stairs--gradually increasing activities as tolerated.  You may have sexual intercourse when it is comfortable.  Refrain from any heavy lifting or straining until approved by your doctor. a. You may drive when you are no longer taking prescription pain medication, you can comfortably wear a seatbelt, and you can safely maneuver your car and apply brakes.  FOLLOW-UP 12. You should see your doctor in the office for a follow-up appointment approximately 2-3 weeks after your surgery.  You should have been given your post-op/follow-up appointment when your surgery was scheduled.  If you did not receive a post-op/follow-up appointment, make sure that you call for this appointment within a day or two after you arrive home to insure a convenient appointment time.  OTHER INSTRUCTIONS 13.   WHEN TO CALL YOUR DOCTOR: 1. Fever over 101.0 2. Inability to urinate 3. Continued bleeding from incision. 4. Increased pain, redness, or drainage from the incision. 5. Increasing abdominal pain  The clinic staff is available to answer your questions during regular business hours.  Please dont hesitate to call and ask to speak to one of the nurses for clinical concerns.  If you have a medical emergency, go to the nearest emergency room or call 911.  A surgeon from [facility] is always on call at the hospital. [address], [place], [place], [place]  [address] ? [address], [place], [place]   [address] [phone] ? [phone] ? FAX [phone] Web site: [email or web address]     Managing Your Pain After Surgery Without  Opioids    Thank you for participating in our program to help patients manage their pain after surgery without opioids. This is part of our effort to provide you with the best care possible, without exposing you or your family to the risk that opioids pose.  What pain can I expect after surgery? You can expect to have some pain after surgery. This is normal. The pain is typically worse the day after surgery, and quickly begins to get better. Many studies have found that many patients are able to manage their pain after surgery with Over-the-Counter (OTC) medications such as Tylenol and Motrin. If you have a condition that does not allow you to take Tylenol or Motrin, notify your surgical team.  How will I manage my pain? The best strategy for controlling your pain after surgery is around the clock pain control with Tylenol (acetaminophen) and Motrin (ibuprofen or Advil). Alternating these medications with each other allows you to maximize your pain control. In addition to Tylenol and Motrin, you can use heating pads or ice packs on your incisions to help reduce your pain.  How will I alternate your regular strength over-the-counter pain medication? You will take a dose of pain medication every three hours. ; Start by taking 650 mg of Tylenol (2 pills of 325 mg) ; 3 hours later take 600 mg of Motrin (3 pills of 200 mg) ; 3 hours after taking the Motrin take 650 mg of Tylenol ; 3 hours after that take 600 mg of Motrin.   - 1 -  See example - if your first dose of Tylenol is at 12:00 PM   12:00 PM Tylenol 650 mg (2 pills of 325 mg)  3:00 PM Motrin 600 mg (3 pills of 200 mg)  6:00 PM Tylenol 650 mg (2 pills of 325 mg)  9:00 PM Motrin 600 mg (3 pills of 200 mg)  Continue alternating every 3 hours   We recommend that you follow this schedule around-the-clock for at least 3 days after surgery, or until you feel that it is no longer needed. Use the table on the last page of this handout to  keep track of the medications you are taking. Important: Do not take more than 3000mg  of Tylenol or 3200mg  of Motrin in a 24-hour period. Do not take ibuprofen/Motrin if you have a history of bleeding stomach ulcers, severe kidney disease, &/or actively taking a blood thinner  What if I still have pain? If you have pain that is not controlled with the over-the-counter pain medications (Tylenol and Motrin or Advil) you might have what we call breakthrough pain. You will receive a prescription for a small amount of an opioid pain medication such as Oxycodone, Tramadol, or Tylenol with Codeine. Use these opioid pills in the first 24 hours after surgery if you have breakthrough pain. Do not take more than 1 pill every 4-6 hours.  If you still have uncontrolled pain after using all opioid pills, don't hesitate to call our staff using the number provided. We will help make sure you are managing your pain in the best way possible, and if necessary, we can provide a prescription for additional pain  medication.   Day 1    Time  Name of Medication Number of pills taken  Amount of Acetaminophen  Pain Level   Comments  AM PM       AM PM       AM PM       AM PM       AM PM       AM PM       AM PM       AM PM       Total Daily amount of Acetaminophen Do not take more than  3,000 mg per day      Day 2    Time  Name of Medication Number of pills taken  Amount of Acetaminophen  Pain Level   Comments  AM PM       AM PM       AM PM       AM PM       AM PM       AM PM       AM PM       AM PM       Total Daily amount of Acetaminophen Do not take more than  3,000 mg per day      Day 3    Time  Name of Medication Number of pills taken  Amount of Acetaminophen  Pain Level   Comments  AM PM       AM PM       AM PM       AM PM          AM PM       AM PM       AM PM       AM PM       Total Daily amount of Acetaminophen Do not take more than  3,000 mg per day      Day  4    Time  Name of Medication Number of pills taken  Amount of Acetaminophen  Pain Level   Comments  AM PM       AM PM       AM PM       AM PM       AM PM       AM PM       AM PM       AM PM       Total Daily amount of Acetaminophen Do not take more than  3,000 mg per day      Day 5    Time  Name of Medication Number of pills taken  Amount of Acetaminophen  Pain Level   Comments  AM PM       AM PM       AM PM       AM PM       AM PM       AM PM       AM PM       AM PM       Total Daily amount of Acetaminophen Do not take more than  3,000 mg per day       Day 6    Time  Name of Medication Number of pills taken  Amount of Acetaminophen  Pain Level  Comments  AM PM       AM PM       AM PM  AM PM       AM PM       AM PM       AM PM       AM PM       Total Daily amount of Acetaminophen Do not take more than  3,000 mg per day      Day 7    Time  Name of Medication Number of pills taken  Amount of Acetaminophen  Pain Level   Comments  AM PM       AM PM       AM PM       AM PM       AM PM       AM PM       AM PM       AM PM       Total Daily amount of Acetaminophen Do not take more than  3,000 mg per day        For additional information about how and where to safely dispose of unused opioid medications - [employer]  Disclaimer: This document contains information and/or instructional materials adapted from [place] for the typical patient with your condition. It does not replace medical advice from your health care provider because your experience may differ from that of the typical patient. Talk to your health care provider if you have any questions about this document, your condition or your treatment plan. Adapted from [employer]

## 2018-12-18 DIAGNOSIS — S5011XA Contusion of right forearm, initial encounter: Secondary | ICD-10-CM | POA: Diagnosis not present

## 2019-11-29 ENCOUNTER — Ambulatory Visit: Payer: Self-pay | Attending: Internal Medicine

## 2019-11-29 DIAGNOSIS — Z23 Encounter for immunization: Secondary | ICD-10-CM

## 2019-11-29 NOTE — Progress Notes (Signed)
   Covid-19 Vaccination Clinic  Name:  Steve Mills    MRN: [id number] DOB: 08-12-1966  11/29/2019  Steve Mills was observed post Covid-19 immunization for 15 minutes without incident. He was provided with Vaccine Information Sheet and instruction to access the V-Safe system.   Steve Mills was instructed to call 911 with any severe reactions post vaccine: Marland Kitchen Difficulty breathing  . Swelling of face and throat  . A fast heartbeat  . A bad rash all over body  . Dizziness and weakness   Immunizations Administered    Name Date Dose VIS Date Route   Pfizer COVID-19 Vaccine 11/29/2019  8:16 AM 0.3 mL 08/01/2019 Intramuscular   Manufacturer: [place]   Lot: [id number]   [place]: [id number]

## 2019-12-12 ENCOUNTER — Other Ambulatory Visit: Payer: Self-pay | Admitting: Family Medicine

## 2019-12-24 ENCOUNTER — Ambulatory Visit: Payer: Self-pay | Attending: Internal Medicine

## 2019-12-24 DIAGNOSIS — Z23 Encounter for immunization: Secondary | ICD-10-CM

## 2019-12-24 NOTE — Progress Notes (Signed)
   Covid-19 Vaccination Clinic  Name:  Steve Mills    MRN: [id number] DOB: 1965/11/19  12/24/2019  [name] was observed post Covid-19 immunization for 15 minutes without incident. He was provided with Vaccine Information Sheet and instruction to access the V-Safe system.   [name] was instructed to call 911 with any severe reactions post vaccine: Marland Kitchen Difficulty breathing  . Swelling of face and throat  . A fast heartbeat  . A bad rash all over body  . Dizziness and weakness   Immunizations Administered    Name Date Dose VIS Date Route   Pfizer COVID-19 Vaccine 12/24/2019  3:53 PM 0.3 mL 10/15/2018 Intramuscular   Manufacturer: [place]   Lot: [id number]   [place]: [id number]

## 2020-12-06 ENCOUNTER — Other Ambulatory Visit: Payer: Self-pay

## 2020-12-06 ENCOUNTER — Encounter: Payer: Self-pay | Admitting: Internal Medicine

## 2020-12-06 ENCOUNTER — Ambulatory Visit (INDEPENDENT_AMBULATORY_CARE_PROVIDER_SITE_OTHER): Payer: 59 | Admitting: Internal Medicine

## 2020-12-06 VITALS — BP 116/80 | HR 60 | Temp 98.0°F | Ht 72.0 in | Wt 242.0 lb

## 2020-12-06 DIAGNOSIS — R911 Solitary pulmonary nodule: Secondary | ICD-10-CM | POA: Diagnosis not present

## 2020-12-06 DIAGNOSIS — Z87891 Personal history of nicotine dependence: Secondary | ICD-10-CM | POA: Diagnosis not present

## 2020-12-06 DIAGNOSIS — Z9189 Other specified personal risk factors, not elsewhere classified: Secondary | ICD-10-CM

## 2020-12-06 DIAGNOSIS — J339 Nasal polyp, unspecified: Secondary | ICD-10-CM | POA: Diagnosis not present

## 2020-12-06 MED ORDER — FLUTICASONE PROPIONATE 50 MCG/ACT NA SUSP: 1.0000 | Freq: Every day | NASAL | 11 refills | Status: DC

## 2020-12-06 NOTE — Progress Notes (Signed)
Steve Mills    [id number]    August 06, 1966  Primary Care Physician:Ross, Dwyane Luo, MD  Referring Physician: [name], MD [address] [place],  [place] [address] Reason for Consultation: "former smoker" Date of Consultation: 12/06/2020  Chief complaint:   Chief Complaint  Patient presents with  . Consult    Former smoker, spot on lung in 2015     HPI: Steve Mills is a 55 y.o. gentleman with former tobacco use disorder who is here for new patient evaluation. He quit in 2015 but was a heavy smoker - 50 pack years. He also has a nodule on his lung back in 2015. His parents both had lung cancer and were smokers. He is concerned for his potential to develop lung cancer and would also like to know if any further follow up is needed for his lung nodule.   He denies any dyspnea with ADLS or with exertion. 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   He had pneumonia once in 2017 treated as an outpatient. He has a history of nasal polyposis and had nasal polypectomy in 2017.  Had bad dreams with budesonide. Used to take nasal decongestants for years and had horrible rebound congestion. He also had a deviated septum. No childhood respiratory disease or asthma. NKDA.   He takes vitamin D and B12 supplementation and otherwise no medications.   Social history:  Occupation: Chiropodist work.  Exposures: lives at home with wife, daughter. 2 dogs Smoking history: as below  Social History   Occupational History  . Not on file  Tobacco Use  . Smoking status: Former Smoker    Packs/day: 2.50    Years: 20.00    Pack years: 50.00    Types: Cigarettes    Quit date: 11/04/2013    Years since quitting: 7.0  . Smokeless tobacco: Never Used  Vaping Use  . Vaping Use: Never used  Substance and Sexual Activity  . Alcohol use: No  . Drug use: No  . Sexual activity: Not on file    Relevant family history: Family History  Problem  Relation Age of Onset  . Lung cancer Mother 66  . Lung cancer Father 14    Past Medical History:  Diagnosis Date  . Family history of adverse reaction to anesthesia    mother allergic to ether   . Pneumonia    hx of 2014     Past Surgical History:  Procedure Laterality Date  . APPENDECTOMY    . CHOLECYSTECTOMY N/A 07/12/2018   Procedure: LAPAROSCOPIC CHOLECYSTECTOMY WITH INTRAOPERATIVE CHOLANGIOGRAM;  Surgeon: Johnathan Hausen, MD;  Location: [facility];  Service: General;  Laterality: N/A;  . left wrist surgery      has plate in arm   . VENTRAL HERNIA REPAIR N/A 07/15/2014   Procedure: LAPAROSCOPIC REPAIR INCARCARTED UMBILICAL  VENTRAL HERNIA, ;  Surgeon: Fanny Skates, MD;  Location: [facility];  Service: General;  Laterality: N/A;  With MESH     Physical Exam: Blood pressure 116/80, pulse 60, temperature 98 F (36.7 C), height 6' (1.829 m), weight 242 lb (109.8 kg), SpO2 96 %. Gen:      No acute distress ENT:  Mild erythema and nasal debris, septum deviated to the left, no nasal polyps, mucus membranes moist Lungs:    No increased respiratory effort, symmetric chest wall excursion, clear to auscultation bilaterally, no wheezes or crackles CV:  Regular rate and rhythm; no murmurs, rubs, or gallops.  No pedal edema Abd:     Obese, + bowel sounds; soft, non-tender; no distension MSK: no acute synovitis of DIP or PIP joints, no mechanics hands.  Skin:      Warm and dry; no rashes Neuro: normal speech, no focal facial asymmetry Psych: alert and oriented x3, normal mood and affect   Data Reviewed/Medical Decision Making:  Independent interpretation of tests: Imaging: . Review of patient's CT Chest 2018 images revealed stable RML pulmonary nodule. The patient's images have been independently reviewed by me.    PFTs: None on file  Labs: CMP March 2022 wnl. Vitamin D and B12 low.   Immunization status:  Immunization History  Administered Date(s) Administered  .  PFIZER(Purple Top)SARS-COV-2 Vaccination 11/29/2019, 12/24/2019  . Tdap 09/16/2012    . I reviewed prior external note(s) from Dr. Harrington Challenger . I reviewed the result(s) of the labs and imaging as noted above.  . I have ordered LDCT   Assessment:  History of tobacco use, need for LDCT for lung cancer screening Pulmonary Nodules - stable no further follow up needed Nasal Polyposis, stable Chronic Allergic Rhinitis, not well controlled  Plan/Recommendations: Steve Mills meets criteria based on age and duration of smoking for LDCT for lung cancer screening. He is interested in this. Will refer to Lung cancer screening clinic. He has no evidence of recurrent nasal polyposis on my exam today - however does have rhinitis. Would recommend starting fluticasone nasal spray once daily.- prescribed today. He would prefer not to take daily medications if possible, and felt budesonide didn't suit him. His pulmonary nodules are stable in 2018 when compared to previous CT.   We discussed disease management and progression at length today.    Return to Care: Follow up in lung cancer screening clinic. He can see me prn.   Lenice Llamas, MD [facility] [place]  CC: [name], MD

## 2020-12-06 NOTE — Patient Instructions (Addendum)
I am referring you to our lung cancer screening program for the low dose CT scan.   Flonase - 1 spray on each side of your nose twice a day for first week, then 1 spray on each side.   Instructions for use:  If you also use a saline nasal spray or rinse, use that first.  Position the head with the chin slightly tucked. Use the right hand to spray into the left nostril and the right hand to spray into the left nostril.   Point the bottle away from the septum of your nose (cartilage that divides the two sides of your nose).   Hold the nostril closed on the opposite side from where you will spray  Spray once and gently sniff to pull the medicine into the higher parts of your nose.  Don't sniff too hard as the medicine will drain down the back of your throat instead.  Repeat with a second spray on the same side if prescribed.  Repeat on the other side of your nose.

## 2020-12-07 ENCOUNTER — Other Ambulatory Visit: Payer: Self-pay | Admitting: *Deleted

## 2020-12-07 DIAGNOSIS — Z87891 Personal history of nicotine dependence: Secondary | ICD-10-CM

## 2021-01-19 ENCOUNTER — Other Ambulatory Visit: Payer: Self-pay

## 2021-01-19 ENCOUNTER — Ambulatory Visit
Admission: RE | Admit: 2021-01-19 | Discharge: 2021-01-19 | Disposition: A | Discharge status: Home / Self Care | Payer: 59 | Source: Ambulatory Visit | Attending: Acute Care | Admitting: Acute Care

## 2021-01-19 ENCOUNTER — Ambulatory Visit (INDEPENDENT_AMBULATORY_CARE_PROVIDER_SITE_OTHER): Payer: 59 | Admitting: Acute Care

## 2021-01-19 ENCOUNTER — Encounter: Payer: Self-pay | Admitting: Acute Care

## 2021-01-19 VITALS — BP 120/84 | HR 55 | Temp 97.3°F | Ht 72.0 in | Wt 233.6 lb

## 2021-01-19 DIAGNOSIS — Z87891 Personal history of nicotine dependence: Secondary | ICD-10-CM | POA: Diagnosis not present

## 2021-01-19 NOTE — Patient Instructions (Signed)
Thank you for participating in the [facility] Lung Cancer Screening Program. It was our pleasure to meet you today. We will call you with the results of your scan within the next few days. Your scan will be assigned a Lung RADS category score by the physicians reading the scans.  This Lung RADS score determines follow up scanning.  See below for description of categories, and follow up screening recommendations. We will be in touch to schedule your follow up screening annually or based on recommendations of our providers. We will fax a copy of your scan results to your Primary Care Physician, or the physician who referred you to the program, to ensure they have the results. Please call the office if you have any questions or concerns regarding your scanning experience or results.  Our office number is [phone]. Please speak with Denise Phelps, RN. She is our Lung Cancer Screening RN. If she is unavailable when you call, please have the office staff send her a message. She will return your call at her earliest convenience. Remember, if your scan is normal, we will scan you annually as long as you continue to meet the criteria for the program. (Age 55-77, Current smoker or smoker who has quit within the last 15 years). If you are a smoker, remember, quitting is the single most powerful action that you can take to decrease your risk of lung cancer and other pulmonary, breathing related problems. We know quitting is hard, and we are here to help.  Please let us know if there is anything we can do to help you meet your goal of quitting. If you are a former smoker, congratulations. We are proud of you! Remain smoke free! Remember you can refer friends or family members through the number above.  We will screen them to make sure they meet criteria for the program. Thank you for helping us take better care of you by participating in Lung Screening.  Lung RADS Categories:  Lung RADS 1: no nodules  or definitely non-concerning nodules.  Recommendation is for a repeat annual scan in 12 months.  Lung RADS 2:  nodules that are non-concerning in appearance and behavior with a very low likelihood of becoming an active cancer. Recommendation is for a repeat annual scan in 12 months.  Lung RADS 3: nodules that are probably non-concerning , includes nodules with a low likelihood of becoming an active cancer.  Recommendation is for a 6-month repeat screening scan. Often noted after an upper respiratory illness. We will be in touch to make sure you have no questions, and to schedule your 6-month scan.  Lung RADS 4 A: nodules with concerning findings, recommendation is most often for a follow up scan in 3 months or additional testing based on our provider's assessment of the scan. We will be in touch to make sure you have no questions and to schedule the recommended 3 month follow up scan.  Lung RADS 4 B:  indicates findings that are concerning. We will be in touch with you to schedule additional diagnostic testing based on our provider's  assessment of the scan.   

## 2021-01-19 NOTE — Progress Notes (Signed)
Shared Decision Making Visit Lung Cancer Screening Program [phone])   Eligibility:  Age 55 y.o.  Pack Years Smoking History Calculation 36 pack year smoking history (# packs/per year x # years smoked)  Recent History of coughing up blood  no  Unexplained weight loss? no ( >Than 15 pounds within the last 6 months )  Prior History Lung / other cancer no (Diagnosis within the last 5 years already requiring surveillance chest CT Scans).  Smoking Status Former Smoker  Former Smokers: Years since quit: 7  Quit Date: 2015  Visit Components:  Discussion included one or more decision making aids. yes  Discussion included risk/benefits of screening. yes  Discussion included potential follow up diagnostic testing for abnormal scans. yes  Discussion included meaning and risk of over diagnosis. yes  Discussion included meaning and risk of False Positives. yes  Discussion included meaning of total radiation exposure. yes  Counseling Included:  Importance of adherence to annual lung cancer LDCT screening. yes  Impact of comorbidities on ability to participate in the program. yes  Ability and willingness to under diagnostic treatment. yes  Smoking Cessation Counseling:  Current Smokers:   Discussed importance of smoking cessation. yes  Information about tobacco cessation classes and interventions provided to patient. yes  Patient provided with "ticket" for LDCT Scan. yes  Symptomatic Patient. no  Counseling NA  Diagnosis Code: Tobacco Use Z72.0  Asymptomatic Patient yes  Counseling (Intermediate counseling: > three minutes counseling) W2585  Former Smokers:   Discussed the importance of maintaining cigarette abstinence. yes  Diagnosis Code: Personal History of Nicotine Dependence. I77.824  Information about tobacco cessation classes and interventions provided to patient. Yes  Patient provided with "ticket" for LDCT Scan. yes  Written Order for Lung Cancer  Screening with LDCT placed in Epic. Yes (CT Chest Lung Cancer Screening Low Dose W/O CM) [id number] Z12.2-Screening of respiratory organs Z87.891-Personal history of nicotine dependence  I spent 25 minutes of face to face time with Mr. Grattan discussing the risks and benefits of lung cancer screening. We viewed a power point together that explained in detail the above noted topics. We took the time to pause the power point at intervals to allow for questions to be asked and answered to ensure understanding. We discussed that he had taken the single most powerful action possible to decrease his risk of developing lung cancer when he quit smoking. I counseled him to remain smoke free, and to contact me if he ever had the desire to smoke again so that I can provide resources and tools to help support the effort to remain smoke free. We discussed the time and location of the scan, and that either  Doroteo Glassman RN or I will call with the results within  24-48 hours of receiving them. He has my card and contact information in the event he needs to speak with me, in addition to a copy of the power point we reviewed as a resource. He verbalized understanding of all of the above and had no further questions upon leaving the office.     I explained to the patient that there has been a high incidence of coronary artery disease noted on these exams. I explained that this is a non-gated exam therefore degree or severity cannot be determined. This patient is not on statin therapy. I have asked the patient to follow-up with their PCP regarding any incidental finding of coronary artery disease and management with diet or medication as they feel is clinically  indicated. The patient verbalized understanding of the above and had no further questions.     Magdalen Spatz, NP 01/19/2021

## 2021-01-27 NOTE — Progress Notes (Signed)
Please call patient and let them  know their  low dose Ct was read as a  Lung RADS 2: nodules that are benign in appearance and behavior with a very low likelihood of becoming a clinically active cancer due to size or lack of growth. Recommendation per radiology is for a repeat LDCT in 12 months. .Please let them  know we will order and schedule their  annual screening scan for 01/2022. Pt.  is not  currently on statin therapy. Please place order for annual  screening scan for  01/2022 and fax results to PCP. Thanks so much.

## 2021-01-28 ENCOUNTER — Encounter: Payer: Self-pay | Admitting: *Deleted

## 2021-01-28 DIAGNOSIS — Z87891 Personal history of nicotine dependence: Secondary | ICD-10-CM

## 2021-09-13 DIAGNOSIS — E538 Deficiency of other specified B group vitamins: Secondary | ICD-10-CM | POA: Diagnosis not present

## 2021-10-11 DIAGNOSIS — E538 Deficiency of other specified B group vitamins: Secondary | ICD-10-CM | POA: Diagnosis not present

## 2021-11-09 DIAGNOSIS — Z0000 Encounter for general adult medical examination without abnormal findings: Secondary | ICD-10-CM | POA: Diagnosis not present

## 2021-11-09 DIAGNOSIS — Z125 Encounter for screening for malignant neoplasm of prostate: Secondary | ICD-10-CM | POA: Diagnosis not present

## 2021-11-09 DIAGNOSIS — Z13220 Encounter for screening for lipoid disorders: Secondary | ICD-10-CM | POA: Diagnosis not present

## 2021-11-09 DIAGNOSIS — E559 Vitamin D deficiency, unspecified: Secondary | ICD-10-CM | POA: Diagnosis not present

## 2021-11-09 DIAGNOSIS — E538 Deficiency of other specified B group vitamins: Secondary | ICD-10-CM | POA: Diagnosis not present

## 2021-11-15 DIAGNOSIS — Z0000 Encounter for general adult medical examination without abnormal findings: Secondary | ICD-10-CM | POA: Diagnosis not present

## 2021-12-30 ENCOUNTER — Other Ambulatory Visit: Payer: Self-pay | Admitting: Internal Medicine

## 2021-12-30 DIAGNOSIS — J339 Nasal polyp, unspecified: Secondary | ICD-10-CM

## 2022-01-19 ENCOUNTER — Ambulatory Visit
Admission: RE | Admit: 2022-01-19 | Discharge: 2022-01-19 | Disposition: A | Discharge status: Home / Self Care | Payer: BC Managed Care – PPO | Source: Ambulatory Visit

## 2022-01-19 DIAGNOSIS — Z87891 Personal history of nicotine dependence: Secondary | ICD-10-CM | POA: Diagnosis not present

## 2022-01-19 DIAGNOSIS — K769 Liver disease, unspecified: Secondary | ICD-10-CM | POA: Diagnosis not present

## 2022-01-19 DIAGNOSIS — I2510 Atherosclerotic heart disease of native coronary artery without angina pectoris: Secondary | ICD-10-CM | POA: Diagnosis not present

## 2022-01-19 DIAGNOSIS — J432 Centrilobular emphysema: Secondary | ICD-10-CM | POA: Diagnosis not present

## 2022-01-23 ENCOUNTER — Other Ambulatory Visit: Payer: Self-pay | Admitting: Internal Medicine

## 2022-01-23 DIAGNOSIS — J339 Nasal polyp, unspecified: Secondary | ICD-10-CM

## 2022-01-25 ENCOUNTER — Telehealth: Payer: Self-pay | Admitting: Acute Care

## 2022-01-25 DIAGNOSIS — Z87891 Personal history of nicotine dependence: Secondary | ICD-10-CM

## 2022-01-25 NOTE — Telephone Encounter (Signed)
I have called the patient with the results if his low dose Ct Chest. I explained that he has a New small solid nodule of the left lower lobe measuring 4.6 mm in mean diameter that will need a 6 month follow up. He is in agreement with this plan. There was an incidental finding of a New indeterminate irregular lesion of the right lobe of the liver measuring 1.2 cm. Radiology recommends a cm liver MRI . I have called Dr. Alan Ripper office to make him aware of the finding and the recommendation for MRI liver if he feels this is appropriate. I spoke with Altha Harm who was sending a message to Dr. Harrington Challenger.I told her to have him reach out to me if her has any questions of concerns.  I have also told the patient to call his PCP office if he has not heard from them in the next few days. He verbalized understanding. Langley Gauss, please place order for 6 month follow up LDCT, and fax results to PCP. Marland Kitchen Thanks so much

## 2022-01-26 NOTE — Telephone Encounter (Signed)
CT results faxed to PCP. Order placed for 6 mth follow up CT.

## 2022-01-30 ENCOUNTER — Other Ambulatory Visit: Payer: Self-pay | Admitting: Family Medicine

## 2022-01-30 DIAGNOSIS — R932 Abnormal findings on diagnostic imaging of liver and biliary tract: Secondary | ICD-10-CM

## 2022-02-17 ENCOUNTER — Ambulatory Visit
Admission: RE | Admit: 2022-02-17 | Discharge: 2022-02-17 | Disposition: A | Discharge status: Home / Self Care | Payer: BC Managed Care – PPO | Source: Ambulatory Visit | Attending: Family Medicine | Admitting: Family Medicine

## 2022-02-17 DIAGNOSIS — K769 Liver disease, unspecified: Secondary | ICD-10-CM | POA: Diagnosis not present

## 2022-02-17 DIAGNOSIS — R932 Abnormal findings on diagnostic imaging of liver and biliary tract: Secondary | ICD-10-CM

## 2022-02-17 MED ORDER — GADOBENATE DIMEGLUMINE 529 MG/ML IV SOLN
20.0000 mL | Freq: Once | INTRAVENOUS | Status: AC | PRN
  Administered 2022-02-17: 20 mL via INTRAVENOUS

## 2022-02-20 ENCOUNTER — Telehealth: Payer: Self-pay | Admitting: Hematology

## 2022-02-20 NOTE — Telephone Encounter (Signed)
Scheduled appt per 7/3 referral. Pt is aware of appt date and time. Pt is aware to arrive 15 mins prior to appt time and to bring and updated insurance card. Pt is aware of appt location.

## 2022-02-23 DIAGNOSIS — K8689 Other specified diseases of pancreas: Secondary | ICD-10-CM | POA: Diagnosis not present

## 2022-02-23 DIAGNOSIS — R935 Abnormal findings on diagnostic imaging of other abdominal regions, including retroperitoneum: Secondary | ICD-10-CM | POA: Diagnosis not present

## 2022-02-24 ENCOUNTER — Other Ambulatory Visit: Payer: Self-pay | Admitting: Gastroenterology

## 2022-02-24 ENCOUNTER — Other Ambulatory Visit (HOSPITAL_COMMUNITY): Payer: Self-pay | Admitting: Gastroenterology

## 2022-02-24 DIAGNOSIS — K8689 Other specified diseases of pancreas: Secondary | ICD-10-CM | POA: Diagnosis not present

## 2022-02-24 DIAGNOSIS — R160 Hepatomegaly, not elsewhere classified: Secondary | ICD-10-CM | POA: Diagnosis not present

## 2022-02-24 DIAGNOSIS — K769 Liver disease, unspecified: Secondary | ICD-10-CM

## 2022-02-26 NOTE — Progress Notes (Addendum)
[facility]   Telephone:(336) [phone] Fax:(336) [phone]   Clinic New Consult Note   Patient Care Team: [name], MD as PCP - General (Family Medicine)  Date of Service:  02/27/2022  CHIEF COMPLAINTS/PURPOSE OF CONSULTATION:  Pancreatic Mass  REFERRING PHYSICIAN:  Dr. Gildardo Cranker  ASSESSMENT & PLAN:  Steve Mills is a 56 y.o. male with  1. Pancreatic Mass in head, highly suspicious for malignancy -incidental finding on lung cancer screening chest CT on 01/19/22. Liver MRI on 02/17/22 showed: 3 cm mass in pancreatic uncinate; two lymph nodes along anterior pancreatic head, and multiple multiple hypovascular (4) liver masses.  Patient is asymptomatic except intentional weight loss. --I reviewed the scan results with them today. -I discussed this is likely malignancy, probable adenocarcinoma, also neuroendocrine tumor with liver metastasis is also a possibility. -We had a long discussion about the overall prognosis of metastatic pancreatic malignancy, especially for adenocarcinoma.  Due to the multiple liver lesions, he is not a candidate for curative surgery.  He was evaluated by pancreatobiliary surgeon Dr. Freida Busman last week.  I did mention the clinical trial by Dr. Colin Mulders at [facility], with surgical resection for pancreatic adenocarcinoma and is limited given metastasis after chemo. -We also reviewed systemic treatment options for metastatic pancreatic cancer, including chemotherapy, and indication for targeted therapy and immunotherapy.  All patient's questions were answered. -I recommend liver biopsy to confirm the diagnosis, and get tissue for molecular testing.  I have personally called IR and moved to her biopsy from July 20 to tomorrow. -I plan to call him later this week to discuss the biopsy results, and finalize his treatment plan.   PLAN:  -liver biopsy by IR tomorrow -Virtual visit later this week to review the biopsy results, and finalize his treatment  plan. -will obtain his lab result from [facility]    HISTORY OF PRESENTING ILLNESS:  Steve Mills 56 y.o. male is a here because of pancreatic mass. The patient was referred by PCP Dr. Tenny Mills. The patient presents to the clinic today accompanied by his wife.   He was found to have an indeterminate right lobe liver lesion on lung cancer screening chest CT on 01/19/22.  He is asymptomatic, denies any abdominal discomfort, fatigue, change of appetite or other new symptoms.  He has intentionally lost about 20 pounds in the past months, by intermittent and exercise.  He underwent further evaluation with liver MRI on 02/17/22 showing: 3 cm mass in pancreatic uncinate process, highly suspicious for adenocarcinoma, no evidence of vascular involvement; two 8-9 mm peripherally enhancing lymph nodes along anterior aspect of pancreatic head, suspicious for focal lymph node metastases; multiple hypovascular liver masses, consistent with liver metastases.  He was evaluated by surgeon Dr. Freida Busman last week, and also some [facility] last week with lab work-up.  He has not passed medical history except obesity and history of cholecystectomy 06/2018   Socially... -he reports ung cancer in both parents. 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   REVIEW OF SYSTEMS:    Constitutional: Denies fevers, chills or abnormal night sweats Eyes: Denies blurriness of vision, double vision or watery eyes Ears, nose, mouth, throat, and face: Denies mucositis or sore throat Respiratory: Denies cough, dyspnea or wheezes Cardiovascular: Denies palpitation, chest discomfort or lower extremity swelling Gastrointestinal:  Denies nausea, heartburn or change in bowel habits Skin: Denies abnormal skin rashes Lymphatics: Denies  new lymphadenopathy or easy bruising Neurological:Denies numbness, tingling or new weaknesses Behavioral/Psych: Mood is stable, no  new changes  All other systems were reviewed with the patient and are negative.   MEDICAL HISTORY:  Past Medical History:  Diagnosis Date   Family history of adverse reaction to anesthesia    mother allergic to ether    Pneumonia    hx of 2014     SURGICAL HISTORY: Past Surgical History:  Procedure Laterality Date   APPENDECTOMY     CHOLECYSTECTOMY N/A 07/12/2018   Procedure: LAPAROSCOPIC CHOLECYSTECTOMY WITH INTRAOPERATIVE CHOLANGIOGRAM;  Surgeon: Luretha Murphy, MD;  Location: [facility];  Service: General;  Laterality: N/A;   left wrist surgery      has plate in arm    NASAL POLYP SURGERY  2017   VENTRAL HERNIA REPAIR N/A 07/15/2014   Procedure: LAPAROSCOPIC REPAIR INCARCARTED UMBILICAL  VENTRAL HERNIA, ;  Surgeon: Claud Kelp, MD;  Location: [facility];  Service: General;  Laterality: N/A;  With MESH    SOCIAL HISTORY: Social History   Socioeconomic History   Marital status: Married    Spouse name: Not on file   Number of children: 3   Years of education: Not on file   Highest education level: Not on file  Occupational History   Not on file  Tobacco Use   Smoking status: Former    Packs/day: 2.50    Years: 20.00    Total pack years: 50.00    Types: Cigarettes    Quit date: 11/04/2013    Years since quitting: 8.3   Smokeless tobacco: Never  Vaping Use   Vaping Use: Never used  Substance and Sexual Activity   Alcohol use: No    Comment: he used to drink alcohol for 5 years when he was young   Drug use: No   Sexual activity: Not on file  Other Topics Concern   Not on file  Social History Narrative   Not on file   Social Determinants of Health   Financial Resource Strain: Not on file  Food Insecurity: Not on file  Transportation Needs: Not on file  Physical Activity: Not on file  Stress: Not on file  Social Connections: Not on file  Intimate Partner Violence: Not on file    FAMILY HISTORY: Family History  Problem Relation Age of Onset   Lung cancer  Mother 56   Lung cancer Father 56   Cancer Paternal Uncle        liver cancer    ALLERGIES:  has No Known Allergies.  MEDICATIONS:  No current outpatient medications on file.   No current facility-administered medications for this visit.   Facility-Administered Medications Ordered in Other Visits  Medication Dose Route Frequency Provider Last Rate Last Admin   0.9 %  sodium chloride infusion   Intravenous Continuous Boisseau, Hayley, PA        PHYSICAL EXAMINATION: ECOG PERFORMANCE STATUS: 0 - Asymptomatic  Vitals:   02/27/22 1520  BP: 122/82  Pulse: 72  Resp: 18  Temp: 98.1 F (36.7 C)  SpO2: 97%   Filed Weights   02/27/22 1520  Weight: 243 lb 12.8 oz (110.6 kg)    GENERAL:alert, no distress and comfortable, obese but well appearing  SKIN: skin color, texture, turgor are normal, no rashes or significant lesions EYES: normal, Conjunctiva are pink and non-injected, sclera clear NECK: supple, thyroid normal size, non-tender, without nodularity LYMPH:  no palpable lymphadenopathy in the cervical, axillary  LUNGS: clear to auscultation  and percussion with normal breathing effort HEART: regular rate & rhythm and no murmurs and no lower extremity edema ABDOMEN:abdomen soft, non-tender and normal bowel sounds Musculoskeletal:no cyanosis of digits and no clubbing  NEURO: alert & oriented x 3 with fluent speech, no focal motor/sensory deficits  LABORATORY DATA:  I have reviewed the data as listed    Latest Ref Rng & Units 02/28/2022    7:42 AM 07/12/2018    4:48 AM 07/11/2018    3:29 PM  CBC  WBC 4.0 - 10.5 K/uL 8.5  12.6  14.2   Hemoglobin 13.0 - 17.0 g/dL 40.9  81.1  91.4   Hematocrit 39.0 - 52.0 % 43.6  43.0  42.4   Platelets 150 - 400 K/uL 285  281  302        Latest Ref Rng & Units 07/12/2018    4:48 AM 07/11/2018    3:29 PM 07/15/2014   12:47 PM  CMP  Glucose 70 - 99 mg/dL  782    BUN 6 - 20 mg/dL  15    Creatinine 9.56 - 1.24 mg/dL 2.13  0.86  5.78    Sodium 135 - 145 mmol/L  139    Potassium 3.5 - 5.1 mmol/L  3.7    Chloride 98 - 111 mmol/L  105    CO2 22 - 32 mmol/L  26    Calcium 8.9 - 10.3 mg/dL  8.9    Total Protein 6.5 - 8.1 g/dL  7.2    Total Bilirubin 0.3 - 1.2 mg/dL  0.7    Alkaline Phos 38 - 126 U/L  58    AST 15 - 41 U/L  21    ALT 0 - 44 U/L  18       RADIOGRAPHIC STUDIES: I have personally reviewed the radiological images as listed and agreed with the findings in the report. MR LIVER W WO CONTRAST  Result Date: 02/17/2022 CLINICAL DATA:  Indeterminate liver lesion seen on recent noncontrast chest CT. EXAM: MRI ABDOMEN WITHOUT AND WITH CONTRAST TECHNIQUE: Multiplanar multisequence MR imaging of the abdomen was performed both before and after the administration of intravenous contrast. CONTRAST:  20mL MULTIHANCE GADOBENATE DIMEGLUMINE 529 MG/ML IV SOLN COMPARISON:  Chest CT on 01/19/2022, and AP CT on 07/11/2018 FINDINGS: Lower chest: No acute findings. Hepatobiliary: 3 hypovascular lesions with peripheral rim enhancement are seen. These are located in segment 4 measuring 2.5 cm and 0.9 cm, and in segment 5 measuring 2.3 cm. Additional sub-centimeter enhancing lesion is seen in the liver dome. These are new compared to previous contrast enhanced CT in 2019. Prior cholecystectomy noted. No evidence of biliary ductal dilatation. Pancreas: A hypovascular lesion with peripheral enhancement is seen in the pancreatic uncinate process, which measures 3.0 x 1.9 cm on image 67/12. This mass is highly suspicious for pancreatic adenocarcinoma. There is no evidence of vascular involvement. Spleen:  Within normal limits in size and appearance. Adrenals/Urinary Tract: No masses identified. No evidence of hydronephrosis. Stomach/Bowel: Unremarkable. Vascular/Lymphatic: Two 8-9 mm peripherally enhancing lymph nodes are seen along the anterior aspect of the pancreatic head which are suspicious for adjacent lymph node metastases. No other abnormal  appearing or pathologically enlarged lymph nodes identified. No acute vascular findings. No evidence of vascular involvement by the mass in the uncinate process of the pancreas. Other:  None. Musculoskeletal:  No suspicious bone lesions identified. IMPRESSION: 3 cm mass in the pancreatic uncinate process, highly suspicious for pancreatic adenocarcinoma. No evidence of vascular involvement. Two  8-9 mm peripherally enhancing lymph nodes along the anterior aspect of the pancreatic head, suspicious for local lymph node metastases. Multiple hypovascular liver masses, consistent with liver metastases. These results will be called to the ordering clinician or representative by the Radiologist Assistant, and communication documented in the PACS or [employer]. Electronically Signed   By: Danae Orleans M.D.   On: 02/17/2022 18:58     No orders of the defined types were placed in this encounter.   All questions were answered. The patient knows to call the clinic with any problems, questions or concerns. The total time spent in the appointment was 60 minutes.     [name], MD 02/27/2022  I, Mickie Bail, am acting as scribe for [name], MD.   I have reviewed the above documentation for accuracy and completeness, and I agree with the above.

## 2022-02-27 ENCOUNTER — Other Ambulatory Visit: Payer: Self-pay

## 2022-02-27 ENCOUNTER — Other Ambulatory Visit (HOSPITAL_COMMUNITY): Payer: Self-pay | Admitting: Physician Assistant

## 2022-02-27 ENCOUNTER — Inpatient Hospital Stay: Payer: BC Managed Care – PPO | Attending: Hematology | Admitting: Hematology

## 2022-02-27 ENCOUNTER — Other Ambulatory Visit (HOSPITAL_COMMUNITY): Payer: Self-pay | Admitting: Gastroenterology

## 2022-02-27 ENCOUNTER — Encounter: Payer: Self-pay | Admitting: Hematology

## 2022-02-27 DIAGNOSIS — Z5111 Encounter for antineoplastic chemotherapy: Secondary | ICD-10-CM | POA: Insufficient documentation

## 2022-02-27 DIAGNOSIS — Z9049 Acquired absence of other specified parts of digestive tract: Secondary | ICD-10-CM | POA: Diagnosis not present

## 2022-02-27 DIAGNOSIS — K869 Disease of pancreas, unspecified: Secondary | ICD-10-CM

## 2022-02-27 DIAGNOSIS — R160 Hepatomegaly, not elsewhere classified: Secondary | ICD-10-CM | POA: Diagnosis not present

## 2022-02-27 DIAGNOSIS — C787 Secondary malignant neoplasm of liver and intrahepatic bile duct: Secondary | ICD-10-CM | POA: Insufficient documentation

## 2022-02-27 DIAGNOSIS — Z452 Encounter for adjustment and management of vascular access device: Secondary | ICD-10-CM | POA: Insufficient documentation

## 2022-02-27 DIAGNOSIS — K769 Liver disease, unspecified: Secondary | ICD-10-CM

## 2022-02-27 DIAGNOSIS — Z01812 Encounter for preprocedural laboratory examination: Secondary | ICD-10-CM

## 2022-02-27 DIAGNOSIS — C250 Malignant neoplasm of head of pancreas: Secondary | ICD-10-CM | POA: Insufficient documentation

## 2022-02-27 DIAGNOSIS — K8689 Other specified diseases of pancreas: Secondary | ICD-10-CM | POA: Insufficient documentation

## 2022-02-27 NOTE — Progress Notes (Unsigned)
Criselda Peaches, MD  Donita Brooks D; P Ir Procedure Requests Approved for US guided bx of liver lesion.   [place]

## 2022-02-28 ENCOUNTER — Encounter: Payer: Self-pay | Admitting: Hematology

## 2022-02-28 ENCOUNTER — Ambulatory Visit (HOSPITAL_COMMUNITY)
Admission: RE | Admit: 2022-02-28 | Discharge: 2022-02-28 | Disposition: A | Discharge status: Home / Self Care | Payer: BC Managed Care – PPO | Source: Ambulatory Visit | Attending: Gastroenterology | Admitting: Gastroenterology

## 2022-02-28 ENCOUNTER — Telehealth: Payer: Self-pay

## 2022-02-28 ENCOUNTER — Other Ambulatory Visit: Payer: Self-pay

## 2022-02-28 ENCOUNTER — Telehealth: Payer: Self-pay | Admitting: Hematology

## 2022-02-28 DIAGNOSIS — C259 Malignant neoplasm of pancreas, unspecified: Secondary | ICD-10-CM | POA: Insufficient documentation

## 2022-02-28 DIAGNOSIS — K769 Liver disease, unspecified: Secondary | ICD-10-CM | POA: Insufficient documentation

## 2022-02-28 DIAGNOSIS — R160 Hepatomegaly, not elsewhere classified: Secondary | ICD-10-CM | POA: Insufficient documentation

## 2022-02-28 DIAGNOSIS — K7689 Other specified diseases of liver: Secondary | ICD-10-CM | POA: Diagnosis not present

## 2022-02-28 DIAGNOSIS — Z01812 Encounter for preprocedural laboratory examination: Secondary | ICD-10-CM

## 2022-02-28 DIAGNOSIS — C229 Malignant neoplasm of liver, not specified as primary or secondary: Secondary | ICD-10-CM | POA: Diagnosis not present

## 2022-02-28 LAB — CBC WITH DIFFERENTIAL/PLATELET
Abs Immature Granulocytes: 0.02 10*3/uL (ref 0.00–0.07)
Basophils Absolute: 0.1 10*3/uL (ref 0.0–0.1)
Basophils Relative: 1 %
Eosinophils Absolute: 0.2 10*3/uL (ref 0.0–0.5)
Eosinophils Relative: 2 %
HCT: 43.6 % (ref 39.0–52.0)
Hemoglobin: 15.3 g/dL (ref 13.0–17.0)
Immature Granulocytes: 0 %
Lymphocytes Relative: 34 %
Lymphs Abs: 2.9 10*3/uL (ref 0.7–4.0)
MCH: 29.6 pg (ref 26.0–34.0)
MCHC: 35.1 g/dL (ref 30.0–36.0)
MCV: 84.3 fL (ref 80.0–100.0)
Monocytes Absolute: 0.6 10*3/uL (ref 0.1–1.0)
Monocytes Relative: 7 %
Neutro Abs: 4.8 10*3/uL (ref 1.7–7.7)
Neutrophils Relative %: 56 %
Platelets: 285 10*3/uL (ref 150–400)
RBC: 5.17 MIL/uL (ref 4.22–5.81)
RDW: 12.2 % (ref 11.5–15.5)
WBC: 8.5 10*3/uL (ref 4.0–10.5)
nRBC: 0 % (ref 0.0–0.2)

## 2022-02-28 LAB — PROTIME-INR
INR: 1.1 (ref 0.8–1.2)
Prothrombin Time: 13.6 seconds (ref 11.4–15.2)

## 2022-02-28 MED ORDER — MIDAZOLAM HCL 2 MG/2ML IJ SOLN
INTRAMUSCULAR | Status: AC
  Filled 2022-02-28: qty 2

## 2022-02-28 MED ORDER — SODIUM CHLORIDE 0.9 % IV SOLN: INTRAVENOUS | Status: DC

## 2022-02-28 MED ORDER — FENTANYL CITRATE (PF) 100 MCG/2ML IJ SOLN
INTRAMUSCULAR | Status: AC
  Filled 2022-02-28: qty 2

## 2022-02-28 MED ORDER — GELATIN ABSORBABLE 12-7 MM EX MISC
CUTANEOUS | Status: AC
  Filled 2022-02-28: qty 1

## 2022-02-28 MED ORDER — LIDOCAINE HCL (PF) 1 % IJ SOLN
INTRAMUSCULAR | Status: AC
  Filled 2022-02-28: qty 30

## 2022-02-28 MED ORDER — FENTANYL CITRATE (PF) 100 MCG/2ML IJ SOLN
INTRAMUSCULAR | Status: AC | PRN
  Administered 2022-02-28: 25 ug via INTRAVENOUS
  Administered 2022-02-28: 50 ug via INTRAVENOUS

## 2022-02-28 MED ORDER — MIDAZOLAM HCL 2 MG/2ML IJ SOLN
INTRAMUSCULAR | Status: AC | PRN
  Administered 2022-02-28: 1 mg via INTRAVENOUS
  Administered 2022-02-28: .5 mg via INTRAVENOUS

## 2022-02-28 NOTE — Progress Notes (Signed)
Patient and wife was given discharge instructions both verbalized understanding.

## 2022-02-28 NOTE — Telephone Encounter (Signed)
Scheduled follow-up appointment per 7/10 los. Patient is aware.

## 2022-02-28 NOTE — H&P (Signed)
Chief Complaint: Patient was seen in consultation today for liver lesion biopsy   Referring Physician(s): [place]  Supervising Physician: Corrie Mckusick  Patient Status: [facility] - Out-pt  History of Present Illness: Steve Mills is a 56 y.o. male with a medical history significant for a recently diagnosed pancreatic mass with multiple liver lesions. A lung cancer screening chest CT identified an indeterminate right lobe liver lesion and these were further evaluated with a liver MRI.    MR Liver 02/17/22 IMPRESSION: 1. 3 cm mass in the pancreatic uncinate process, highly suspicious for pancreatic adenocarcinoma. No evidence of vascular involvement. 2. Two 8-9 mm peripherally enhancing lymph nodes along the anterior aspect of the pancreatic head, suspicious for local lymph node metastases. 3. Multiple hypovascular liver masses, consistent with liver metastases.  He has been referred to Oncology, Surgery and GI. Interventional Radiology has been asked to evaluate this patient for an image-guided liver lesion biopsy. Imaging reviewed and procedure approved by Dr. Laurence Ferrari.   Past Medical History:  Diagnosis Date   Family history of adverse reaction to anesthesia    mother allergic to ether    Pneumonia    hx of 2014     Past Surgical History:  Procedure Laterality Date   APPENDECTOMY     CHOLECYSTECTOMY N/A 07/12/2018   Procedure: LAPAROSCOPIC CHOLECYSTECTOMY WITH INTRAOPERATIVE CHOLANGIOGRAM;  Surgeon: Johnathan Hausen, MD;  Location: [facility];  Service: General;  Laterality: N/A;   left wrist surgery      has plate in arm    NASAL POLYP SURGERY  2017   VENTRAL HERNIA REPAIR N/A 07/15/2014   Procedure: LAPAROSCOPIC REPAIR INCARCARTED UMBILICAL  VENTRAL HERNIA, ;  Surgeon: Fanny Skates, MD;  Location: [facility];  Service: General;  Laterality: N/A;  With MESH    Allergies: Patient has no known allergies.  Medications: Prior to Admission medications   Not on File      Family History  Problem Relation Age of Onset   Lung cancer Mother 12   Lung cancer Father 16   Cancer Paternal Uncle        liver cancer    Social History   Socioeconomic History   Marital status: Married    Spouse name: Not on file   Number of children: 3   Years of education: Not on file   Highest education level: Not on file  Occupational History   Not on file  Tobacco Use   Smoking status: Former    Packs/day: 2.50    Years: 20.00    Total pack years: 50.00    Types: Cigarettes    Quit date: 11/04/2013    Years since quitting: 8.3   Smokeless tobacco: Never  Vaping Use   Vaping Use: Never used  Substance and Sexual Activity   Alcohol use: No    Comment: he used to drink alcohol for 5 years when he was young   Drug use: No   Sexual activity: Not on file  Other Topics Concern   Not on file  Social History Narrative   Not on file   Social Determinants of Health   Financial Resource Strain: Not on file  Food Insecurity: Not on file  Transportation Needs: Not on file  Physical Activity: Not on file  Stress: Not on file  Social Connections: Not on file    Review of Systems: A 12 point ROS discussed and pertinent positives are indicated in the HPI above.  All other systems are negative.  Review of Systems  Constitutional:  Negative for appetite change and fatigue.  Respiratory:  Negative for cough and shortness of breath.   Cardiovascular:  Negative for chest pain and leg swelling.  Gastrointestinal:  Negative for abdominal pain, diarrhea, nausea and vomiting.  Musculoskeletal:  Negative for back pain.  Neurological:  Negative for dizziness and headaches.    Vital Signs: BP 118/87   Pulse 63   Temp 98.3 F (36.8 C) (Oral)   Resp 16   Ht 6' (1.829 m)   Wt 238 lb (108 kg)   SpO2 95%   BMI 32.28 kg/m   Physical Exam Constitutional:      General: He is not in acute distress.    Appearance: He is not ill-appearing.  HENT:     Mouth/Throat:      Mouth: Mucous membranes are moist.     Pharynx: Oropharynx is clear.  Cardiovascular:     Rate and Rhythm: Normal rate and regular rhythm.     Pulses: Normal pulses.     Heart sounds: Normal heart sounds.  Pulmonary:     Effort: Pulmonary effort is normal.     Breath sounds: Normal breath sounds.  Abdominal:     General: Bowel sounds are normal.     Palpations: Abdomen is soft.     Tenderness: There is no abdominal tenderness.  Musculoskeletal:     Right lower leg: No edema.     Left lower leg: No edema.  Skin:    General: Skin is warm and dry.  Neurological:     Mental Status: He is alert and oriented to person, place, and time.  Psychiatric:        Mood and Affect: Mood normal.        Behavior: Behavior normal.        Thought Content: Thought content normal.        Judgment: Judgment normal.     Imaging: MR LIVER W WO CONTRAST  Result Date: 02/17/2022 CLINICAL DATA:  Indeterminate liver lesion seen on recent noncontrast chest CT. EXAM: MRI ABDOMEN WITHOUT AND WITH CONTRAST TECHNIQUE: Multiplanar multisequence MR imaging of the abdomen was performed both before and after the administration of intravenous contrast. CONTRAST:  72m MULTIHANCE GADOBENATE DIMEGLUMINE 529 MG/ML IV SOLN COMPARISON:  Chest CT on 01/19/2022, and AP CT on 07/11/2018 FINDINGS: Lower chest: No acute findings. Hepatobiliary: 3 hypovascular lesions with peripheral rim enhancement are seen. These are located in segment 4 measuring 2.5 cm and 0.9 cm, and in segment 5 measuring 2.3 cm. Additional sub-centimeter enhancing lesion is seen in the liver dome. These are new compared to previous contrast enhanced CT in 2019. Prior cholecystectomy noted. No evidence of biliary ductal dilatation. Pancreas: A hypovascular lesion with peripheral enhancement is seen in the pancreatic uncinate process, which measures 3.0 x 1.9 cm on image 67/12. This mass is highly suspicious for pancreatic adenocarcinoma. There is no  evidence of vascular involvement. Spleen:  Within normal limits in size and appearance. Adrenals/Urinary Tract: No masses identified. No evidence of hydronephrosis. Stomach/Bowel: Unremarkable. Vascular/Lymphatic: Two 8-9 mm peripherally enhancing lymph nodes are seen along the anterior aspect of the pancreatic head which are suspicious for adjacent lymph node metastases. No other abnormal appearing or pathologically enlarged lymph nodes identified. No acute vascular findings. No evidence of vascular involvement by the mass in the uncinate process of the pancreas. Other:  None. Musculoskeletal:  No suspicious bone lesions identified. IMPRESSION: 3 cm mass in the pancreatic uncinate process, highly  suspicious for pancreatic adenocarcinoma. No evidence of vascular involvement. Two 8-9 mm peripherally enhancing lymph nodes along the anterior aspect of the pancreatic head, suspicious for local lymph node metastases. Multiple hypovascular liver masses, consistent with liver metastases. These results will be called to the ordering clinician or representative by the Radiologist Assistant, and communication documented in the PACS or [employer]. Electronically Signed   By: Marlaine Hind M.D.   On: 02/17/2022 18:58    Labs:  CBC: Recent Labs    02/28/22 0742  WBC 8.5  HGB 15.3  HCT 43.6  PLT 285    COAGS: Recent Labs    02/28/22 0742  INR 1.1    BMP: No results for input(s): "NA", "K", "CL", "CO2", "GLUCOSE", "BUN", "CALCIUM", "CREATININE", "GFRNONAA", "GFRAA" in the last 8760 hours.  Invalid input(s): "CMP"  LIVER FUNCTION TESTS: No results for input(s): "BILITOT", "AST", "ALT", "ALKPHOS", "PROT", "ALBUMIN" in the last 8760 hours.  TUMOR MARKERS: No results for input(s): "AFPTM", "CEA", "CA199", "CHROMGRNA" in the last 8760 hours.  Assessment and Plan:  Pancreatic mass with liver lesions: Steve Mills, 56 year old male, presents today to the [facility]  department for an image-guided liver lesion biopsy.  Risks and benefits of this procedure were discussed with the patient and/or patient's family including, but not limited to bleeding, infection, damage to adjacent structures or low yield requiring additional tests.  All of the questions were answered and there is agreement to proceed. He has been NPO. He does not take any anticoagulants.   Consent signed and in chart.  Thank you for this interesting consult.  I greatly enjoyed meeting Steve Mills and look forward to participating in their care.  A copy of this report was sent to the requesting provider on this date.  Electronically Signed: Soyla Dryer, AGACNP-BC [phone] 02/28/2022, 8:18 AM   I spent a total of  30 Minutes   in face to face in clinical consultation, greater than 50% of which was counseling/coordinating care for liver lesion biopsy.

## 2022-02-28 NOTE — Telephone Encounter (Signed)
Per Dr. Ernestina Penna request, Epic faxed Dr. Ernestina Penna last office note on 02/27/2022 to Dr. Harrington Challenger for review.  Epic fax confirmation received.

## 2022-02-28 NOTE — Procedures (Signed)
Interventional Radiology Procedure Note  Procedure: US guided biopsy of liver mass, segment 4 Complications: None EBL: None Recommendations: - Bedrest 2 hours.   - Routine wound care - Follow up pathology - Advance diet   Signed,  Corrie Mckusick, DO

## 2022-03-01 ENCOUNTER — Encounter: Payer: Self-pay | Admitting: Licensed Clinical Social Worker

## 2022-03-01 ENCOUNTER — Other Ambulatory Visit: Payer: Self-pay

## 2022-03-01 DIAGNOSIS — C259 Malignant neoplasm of pancreas, unspecified: Secondary | ICD-10-CM | POA: Diagnosis not present

## 2022-03-01 DIAGNOSIS — R238 Other skin changes: Secondary | ICD-10-CM | POA: Diagnosis not present

## 2022-03-01 DIAGNOSIS — K8689 Other specified diseases of pancreas: Secondary | ICD-10-CM

## 2022-03-01 NOTE — Progress Notes (Signed)
[facility]  Initial Assessment   Steve Mills is a 56 y.o. year old male accompanied by patient and wife. Clinical Social Work was referred by medical provider for assessment of psychosocial needs.   SDOH (Social Determinants of Health) assessments performed: Yes   SDOH Screenings   Alcohol Screen: Not on file  Depression (JAS5-0): Not on file  Financial Resource Strain: Not on file  Food Insecurity: Not on file  Housing: Not on file  Physical Activity: Not on file  Social Connections: Not on file  Stress: Not on file  Tobacco Use: Medium Risk (02/28/2022)   Patient History    Smoking Tobacco Use: Former    Smokeless Tobacco Use: Never    Passive Exposure: Not on file  Transportation Needs: Not on file     Distress Screen completed: No     No data to display            Family/Social Information:  Housing Arrangement: patient lives with spouse Steve Mills) as well as their 57 year old daughter and 33 month old grandchild. Family members/support persons in your life? Family Transportation concerns: no  Employment: Working full time Pt is an Chief Executive Officer.  Income source: Employment Financial concerns: No Type of concern: None Food access concerns: no Religious or spiritual practice: Hydrographic surveyor Currently in place:  none  Coping/ Adjustment to diagnosis: Patient understands treatment plan and what happens next? Pt still undergoing workup.  Treatment plan not finalized at this time. Concerns about diagnosis and/or treatment:  Pt lost both parents to lung cancer within a year of each other.  Pt extremely concerned about prognosis and the impact this will have on his family. Patient reported stressors: Adjusting to my illness and Facing my mortality Hopes and/or priorities: Pt's priority is to get through diagnostic testing and move to treatment with the hope of a positive outcome.  Patient enjoys time with family/ friends Current coping  skills/ strengths: Motivation for treatment/growth , Physical Health , and Supportive family/friends     SUMMARY: Current SDOH Barriers:  No barriers identified at this time  Clinical Social Work Clinical Goal(s):  No clinical social work goals at this time  Interventions: Discussed common feeling and emotions when being diagnosed with cancer, and the importance of support during treatment Informed patient of the support team roles and support services at [facility] Provided CSW contact information and encouraged patient to call with any questions or concerns Provided patient with information about supportive services available throughout treatment.   Follow Up Plan: Patient will contact CSW with any support or resource needs.  CSW to check in with pt after treatment begins to reassess for needs. Patient verbalizes understanding of plan: Yes    Henriette Combs, LCSW

## 2022-03-02 DIAGNOSIS — C259 Malignant neoplasm of pancreas, unspecified: Secondary | ICD-10-CM | POA: Insufficient documentation

## 2022-03-02 MED ORDER — PROCHLORPERAZINE MALEATE 10 MG PO TABS: 10.0000 mg | ORAL_TABLET | Freq: Four times a day (QID) | ORAL | 1 refills | Status: DC | PRN

## 2022-03-02 MED ORDER — LIDOCAINE-PRILOCAINE 2.5-2.5 % EX CREA: TOPICAL_CREAM | CUTANEOUS | 3 refills | Status: DC

## 2022-03-02 MED ORDER — ONDANSETRON HCL 8 MG PO TABS: 8.0000 mg | ORAL_TABLET | Freq: Two times a day (BID) | ORAL | 1 refills | Status: DC | PRN

## 2022-03-02 NOTE — Progress Notes (Signed)
[name]   [phone] [phone]   Clinic Follow up Note   Patient Care Team: [name], MD as PCP - General (Family Medicine) Truitt Merle, MD as Consulting Physician (Oncology) Royston Bake, RN as Oncology Nurse Navigator (Oncology)  Date of Service:  03/03/2022  I connected with Steve Mills on 03/03/2022 at  8:20 AM EDT by video enabled telemedicine visit and verified that I am speaking with the correct person using two identifiers.  I discussed the limitations, risks, security and privacy concerns of performing an evaluation and management service by telephone and the availability of in person appointments. I also discussed with the patient that there may be a patient responsible charge related to this service. The patient expressed understanding and agreed to proceed.   Other persons participating in the visit and their role in the encounter:  wife   Patient's location:  home  Provider's location:  my office  CHIEF COMPLAINT: f/u of metastatic pancreatic cancer  CURRENT THERAPY:  Pending chemo FOLFIRINOX   ASSESSMENT & PLAN:  Steve Mills is a 56 y.o. male with   1. Pancreatic Head Adenocarcinoma, metastatic to liver, stage IV  -incidental finding on lung cancer screening chest CT on 01/19/22. Liver MRI on 02/17/22 showed: 3 cm mass in pancreatic uncinate; two lymph nodes along anterior pancreatic head, and multiple multiple hypovascular (4) liver masses.  -liver biopsy on 02/28/22 confirmed poorly differentiated adenocarcinoma, compatible with clinical suspicion of pancreatic adenocarcinoma  -I discussed the liver biopsy results with patient in detail, he voiced good understanding. -I have requested MMR, and foundation One on his liver biopsy, to see if he is a candidate for targeted therapy or immunotherapy. -I recommend systemic chemotherapy, I discussed the option of FOLFIRINOX, and gemcitabine/Abraxane.  Given his young age,  excellent performance status, I recommend FOLFIRINOX --Chemotherapy consent: Side effects including but does not not limited to, fatigue, nausea, vomiting, diarrhea, hair loss, neuropathy, fluid retention, renal and kidney dysfunction, neutropenic fever, needed for blood transfusion, bleeding, were discussed with patient in great detail. He agrees to proceed. -The goal of chemotherapy is palliative, to prolong his life, and control his disease. -If he has excellent response to chemotherapy, I may refer him to Dr. Grayling Congress at [facility], to see if he is eligible for the trial for pancreatic cancer with limited liver metastasis, and consider surgical resection of the primary and liver metastasis. He is interested.  -Plan to start chemotherapy in the next 1 to 2 weeks. -Plan to do genetic testing next week    2. Goal of care discussion  -We again discussed the incurable nature of his cancer, and the overall poor prognosis, especially if he does not have good response to chemotherapy or progress on chemo -The patient understands the goal of care is palliative. -he is full code now     PLAN:  -Liver biopsy results discussed with patient -Chemo class, genetic counseling and testing next week -I have reached out to pathology lab for MMR and foundation 1 testing on his liver biopsy -Lab, flush, follow-up and chemotherapy FOLFIRINOX next week or 7/24 -Dr. Zenia Resides will place a port for him  .   No problem-specific Assessment & Plan notes found for this encounter.   SUMMARY OF ONCOLOGIC HISTORY: Oncology History  Pancreatic cancer metastasized to liver [facility])  02/28/2022 Cancer Staging   Staging form: Exocrine Pancreas, AJCC 8th Edition - Clinical stage from 02/28/2022: Stage IV (cT2, cN1, pM1) - Signed  by Truitt Merle, MD on 03/02/2022 Stage prefix: Initial diagnosis   03/02/2022 Initial Diagnosis   Pancreatic cancer metastasized to liver [facility])   03/09/2022 -  Chemotherapy   Patient is on Treatment Plan :  PANCREAS Modified FOLFIRINOX q14d x 4 cycles        INTERVAL HISTORY:  Steve Mills was contacted for a follow up of metastatic pancreatic cancer. He was last seen by me on 02/27/22 in consultation.  He is doing well overall, no complaints.  He tolerated liver biopsy very well 3 days ago.   All other systems were reviewed with the patient and are negative.  MEDICAL HISTORY:  Past Medical History:  Diagnosis Date   Family history of adverse reaction to anesthesia    mother allergic to ether    Pneumonia    hx of 2014     SURGICAL HISTORY: Past Surgical History:  Procedure Laterality Date   APPENDECTOMY     CHOLECYSTECTOMY N/A 07/12/2018   Procedure: LAPAROSCOPIC CHOLECYSTECTOMY WITH INTRAOPERATIVE CHOLANGIOGRAM;  Surgeon: Johnathan Hausen, MD;  Location: [facility];  Service: General;  Laterality: N/A;   left wrist surgery      has plate in arm    NASAL POLYP SURGERY  2017   VENTRAL HERNIA REPAIR N/A 07/15/2014   Procedure: LAPAROSCOPIC REPAIR INCARCARTED UMBILICAL  VENTRAL HERNIA, ;  Surgeon: Fanny Skates, MD;  Location: [facility];  Service: General;  Laterality: N/A;  With MESH    I have reviewed the social history and family history with the patient and they are unchanged from previous note.  ALLERGIES:  has No Known Allergies.  MEDICATIONS:  Current Outpatient Medications  Medication Sig Dispense Refill   lidocaine-prilocaine (EMLA) cream Apply to affected area once 30 g 3   ondansetron (ZOFRAN) 8 MG tablet Take 1 tablet (8 mg total) by mouth 2 (two) times daily as needed. Start on day 3 after chemotherapy. 30 tablet 1   prochlorperazine (COMPAZINE) 10 MG tablet Take 1 tablet (10 mg total) by mouth every 6 (six) hours as needed (Nausea or vomiting). 30 tablet 1   No current facility-administered medications for this visit.    PHYSICAL EXAMINATION: ECOG PERFORMANCE STATUS: 0 - Asymptomatic  There were no vitals filed for this visit. Wt Readings from Last 3 Encounters:   02/28/22 238 lb (108 kg)  02/27/22 243 lb 12.8 oz (110.6 kg)  01/19/21 233 lb 9.6 oz (106 kg)     No vitals taken today, Exam not performed today  LABORATORY DATA:  I have reviewed the data as listed    Latest Ref Rng & Units 02/28/2022    7:42 AM 07/12/2018    4:48 AM 07/11/2018    3:29 PM  CBC  WBC 4.0 - 10.5 K/uL 8.5  12.6  14.2   Hemoglobin 13.0 - 17.0 g/dL 15.3  13.8  14.3   Hematocrit 39.0 - 52.0 % 43.6  43.0  42.4   Platelets 150 - 400 K/uL 285  281  302         Latest Ref Rng & Units 07/12/2018    4:48 AM 07/11/2018    3:29 PM 07/15/2014   12:47 PM  CMP  Glucose 70 - 99 mg/dL  102    BUN 6 - 20 mg/dL  15    Creatinine 0.61 - 1.24 mg/dL 0.86  0.81  0.79   Sodium 135 - 145 mmol/L  139    Potassium 3.5 - 5.1 mmol/L  3.7  Chloride 98 - 111 mmol/L  105    CO2 22 - 32 mmol/L  26    Calcium 8.9 - 10.3 mg/dL  8.9    Total Protein 6.5 - 8.1 g/dL  7.2    Total Bilirubin 0.3 - 1.2 mg/dL  0.7    Alkaline Phos 38 - 126 U/L  58    AST 15 - 41 U/L  21    ALT 0 - 44 U/L  18        RADIOGRAPHIC STUDIES: I have personally reviewed the radiological images as listed and agreed with the findings in the report. No results found.    Orders Placed This Encounter  Procedures   CBC with Differential ([place] Only)    Standing Status:   Standing    Number of Occurrences:   20    Standing Expiration Date:   03/03/2023   CMP ([place] only)    Standing Status:   Standing    Number of Occurrences:   20    Standing Expiration Date:   03/03/2023   Cancer antigen 19-9    Standing Status:   Standing    Number of Occurrences:   20    Standing Expiration Date:   03/04/2023   Ambulatory referral to Genetics    Referral Priority:   Urgent    Referral Type:   Consultation    Referral Reason:   Specialty Services Required    Number of Visits Requested:   1   All questions were answered. The patient knows to call the clinic with any problems, questions or concerns. No  barriers to learning was detected. The total time spent in the appointment was 30 minutes.     Truitt Merle, MD 03/03/2022   I, Wilburn Mylar, am acting as scribe for Truitt Merle, MD.   I have reviewed the above documentation for accuracy and completeness, and I agree with the above.

## 2022-03-02 NOTE — Progress Notes (Signed)
START ON PATHWAY REGIMEN - Pancreatic Adenocarcinoma     A cycle is every 14 days:     Irinotecan      Oxaliplatin      Leucovorin      Fluorouracil   **Always confirm dose/schedule in your pharmacy ordering system**  Patient Characteristics: Metastatic Disease, First Line, PS = 0,1, BRCA1/2 and PALB2  Mutation Absent/Unknown Therapeutic Status: Metastatic Disease Line of Therapy: First Line ECOG Performance Status: 0 BRCA1/2 Mutation Status: Awaiting Test Results PALB2 Mutation Status: Awaiting Test Results Intent of Therapy: Non-Curative / Palliative Intent, Discussed with Patient

## 2022-03-03 ENCOUNTER — Telehealth: Payer: Self-pay | Admitting: *Deleted

## 2022-03-03 ENCOUNTER — Encounter: Payer: Self-pay | Admitting: Genetic Counselor

## 2022-03-03 ENCOUNTER — Telehealth: Payer: Self-pay | Admitting: Hematology

## 2022-03-03 ENCOUNTER — Inpatient Hospital Stay (HOSPITAL_BASED_OUTPATIENT_CLINIC_OR_DEPARTMENT_OTHER): Payer: BC Managed Care – PPO | Admitting: Hematology

## 2022-03-03 DIAGNOSIS — C787 Secondary malignant neoplasm of liver and intrahepatic bile duct: Secondary | ICD-10-CM | POA: Diagnosis not present

## 2022-03-03 DIAGNOSIS — C259 Malignant neoplasm of pancreas, unspecified: Secondary | ICD-10-CM

## 2022-03-03 NOTE — Telephone Encounter (Signed)
.  Called patient to schedule appointment per 7/14 inbasket, patient is aware of date and time.

## 2022-03-03 NOTE — Telephone Encounter (Signed)
Lidocaine-Prilocaine 2.5-2.5% Medication Prior Authorization Status  Processed CoverMyMeds KEYRossie Muskrat - Rx #: [id number]  Approved Today  Per Sherre Poot [facility]  Case ID: N/A    Effective 03/03/2022 through 05/31/2022.Marland Kitchen

## 2022-03-07 LAB — SURGICAL PATHOLOGY

## 2022-03-08 ENCOUNTER — Inpatient Hospital Stay (HOSPITAL_BASED_OUTPATIENT_CLINIC_OR_DEPARTMENT_OTHER): Payer: BC Managed Care – PPO | Admitting: Genetic Counselor

## 2022-03-08 ENCOUNTER — Other Ambulatory Visit: Payer: Self-pay

## 2022-03-08 ENCOUNTER — Inpatient Hospital Stay: Payer: BC Managed Care – PPO

## 2022-03-08 ENCOUNTER — Other Ambulatory Visit: Payer: Self-pay | Admitting: Genetic Counselor

## 2022-03-08 ENCOUNTER — Encounter: Payer: Self-pay | Admitting: Genetic Counselor

## 2022-03-08 DIAGNOSIS — C250 Malignant neoplasm of head of pancreas: Secondary | ICD-10-CM | POA: Diagnosis not present

## 2022-03-08 DIAGNOSIS — C259 Malignant neoplasm of pancreas, unspecified: Secondary | ICD-10-CM

## 2022-03-08 DIAGNOSIS — Z800 Family history of malignant neoplasm of digestive organs: Secondary | ICD-10-CM | POA: Diagnosis not present

## 2022-03-08 DIAGNOSIS — Z5111 Encounter for antineoplastic chemotherapy: Secondary | ICD-10-CM | POA: Diagnosis not present

## 2022-03-08 DIAGNOSIS — C787 Secondary malignant neoplasm of liver and intrahepatic bile duct: Secondary | ICD-10-CM

## 2022-03-08 DIAGNOSIS — Z803 Family history of malignant neoplasm of breast: Secondary | ICD-10-CM

## 2022-03-08 DIAGNOSIS — Z452 Encounter for adjustment and management of vascular access device: Secondary | ICD-10-CM | POA: Diagnosis not present

## 2022-03-08 DIAGNOSIS — R160 Hepatomegaly, not elsewhere classified: Secondary | ICD-10-CM | POA: Diagnosis not present

## 2022-03-08 LAB — CMP (CANCER CENTER ONLY)
ALT: 17 U/L (ref 0–44)
AST: 17 U/L (ref 15–41)
Albumin: 4.6 g/dL (ref 3.5–5.0)
Alkaline Phosphatase: 48 U/L (ref 38–126)
Anion gap: 5 (ref 5–15)
BUN: 12 mg/dL (ref 6–20)
CO2: 29 mmol/L (ref 22–32)
Calcium: 9.7 mg/dL (ref 8.9–10.3)
Chloride: 106 mmol/L (ref 98–111)
Creatinine: 0.85 mg/dL (ref 0.61–1.24)
GFR, Estimated: >60 mL/min (ref >60)
Glucose, Bld: 108 mg/dL — ABNORMAL HIGH (ref 70–99)
Potassium: 4.2 mmol/L (ref 3.5–5.1)
Sodium: 140 mmol/L (ref 135–145)
Total Bilirubin: 0.6 mg/dL (ref 0.3–1.2)
Total Protein: 7.2 g/dL (ref 6.5–8.1)

## 2022-03-08 LAB — CBC WITH DIFFERENTIAL (CANCER CENTER ONLY)
Abs Immature Granulocytes: 0.01 10*3/uL (ref 0.00–0.07)
Basophils Absolute: 0.1 10*3/uL (ref 0.0–0.1)
Basophils Relative: 1 %
Eosinophils Absolute: 0.2 10*3/uL (ref 0.0–0.5)
Eosinophils Relative: 3 %
HCT: 42.1 % (ref 39.0–52.0)
Hemoglobin: 14.8 g/dL (ref 13.0–17.0)
Immature Granulocytes: 0 %
Lymphocytes Relative: 43 %
Lymphs Abs: 3 10*3/uL (ref 0.7–4.0)
MCH: 30.3 pg (ref 26.0–34.0)
MCHC: 35.2 g/dL (ref 30.0–36.0)
MCV: 86.1 fL (ref 80.0–100.0)
Monocytes Absolute: 0.5 10*3/uL (ref 0.1–1.0)
Monocytes Relative: 7 %
Neutro Abs: 3.2 10*3/uL (ref 1.7–7.7)
Neutrophils Relative %: 46 %
Platelet Count: 275 10*3/uL (ref 150–400)
RBC: 4.89 MIL/uL (ref 4.22–5.81)
RDW: 12.4 % (ref 11.5–15.5)
WBC Count: 6.9 10*3/uL (ref 4.0–10.5)
nRBC: 0 % (ref 0.0–0.2)

## 2022-03-08 LAB — GENETIC SCREENING ORDER

## 2022-03-08 NOTE — Progress Notes (Signed)
The proposed treatment discussed in conference is for discussion purpose only and is not a binding recommendation.  The patients have not been physically examined, or presented with their treatment options.  Therefore, final treatment plans cannot be decided.  

## 2022-03-08 NOTE — Progress Notes (Signed)
REFERRING PROVIDER: Truitt Merle, MD [address] [place],  [place] [address]  PRIMARY PROVIDER:  [name], MD  PRIMARY REASON FOR VISIT:  1. Family history of breast cancer   2. Family history of colon cancer   3. Pancreatic cancer metastasized to liver [facility])      HISTORY OF PRESENT ILLNESS:   [name], a 56 y.o. male, was seen for a [facility] cancer genetics consultation at the request of Dr. Burr Medico due to a personal and family history of cancer.  Steve Mills presents to clinic today to discuss the possibility of a hereditary predisposition to cancer, genetic testing, and to further clarify his future cancer risks, as well as potential cancer risks for family members.   In July 2023, at the age of 56, Steve Mills was diagnosed with pancreatic cancer.  A liver biopsy showed normal IHC.     CANCER HISTORY:  Oncology History  Pancreatic cancer metastasized to liver [facility])  02/28/2022 Cancer Staging   Staging form: Exocrine Pancreas, AJCC 8th Edition - Clinical stage from 02/28/2022: Stage IV (cT2, cN1, pM1) - Signed by Truitt Merle, MD on 03/02/2022 Stage prefix: Initial diagnosis   03/02/2022 Initial Diagnosis   Pancreatic cancer metastasized to liver ([place])   03/13/2022 -  Chemotherapy   Patient is on Treatment Plan : PANCREAS Modified FOLFIRINOX q14d x 4 cycles       Past Medical History:  Diagnosis Date   Family history of adverse reaction to anesthesia    mother allergic to ether    Family history of breast cancer    Family history of colon cancer    Pneumonia    hx of 2014     Past Surgical History:  Procedure Laterality Date   APPENDECTOMY     CHOLECYSTECTOMY N/A 07/12/2018   Procedure: LAPAROSCOPIC CHOLECYSTECTOMY WITH INTRAOPERATIVE CHOLANGIOGRAM;  Surgeon: Johnathan Hausen, MD;  [facility];  Service: General;  Laterality: N/A;   left wrist surgery      has plate in arm    NASAL POLYP SURGERY  2017   VENTRAL HERNIA REPAIR N/A 07/15/2014    Procedure: LAPAROSCOPIC REPAIR INCARCARTED UMBILICAL  VENTRAL HERNIA, ;  Surgeon: Fanny Skates, MD;  Location: [facility];  Service: General;  Laterality: N/A;  With MESH    Social History   Socioeconomic History   Marital status: Married    Spouse name: Not on file   Number of children: 3   Years of education: Not on file   Highest education level: Not on file  Occupational History   Not on file  Tobacco Use   Smoking status: Former    Packs/day: 2.50    Years: 20.00    Total pack years: 50.00    Types: Cigarettes    Quit date: 11/04/2013    Years since quitting: 8.3   Smokeless tobacco: Never  Vaping Use   Vaping Use: Never used  Substance and Sexual Activity   Alcohol use: No    Comment: he used to drink alcohol for 5 years when he was young   Drug use: No   Sexual activity: Not on file  Other Topics Concern   Not on file  Social History Narrative   Not on file   Social Determinants of Health   Financial Resource Strain: Not on file  Food Insecurity: Not on file  Transportation Needs: Not on file  Physical Activity: Not on file  Stress: Not on file  Social Connections: Not on  file     FAMILY HISTORY:  We obtained a detailed, 4-generation family history.  Significant diagnoses are listed below: Family History  Problem Relation Age of Onset   Lung cancer Mother 73   Breast cancer Mother        dx in her 29s or possibly 72s   Lung cancer Father 71   Breast cancer Maternal Aunt    Brain cancer Maternal Uncle    Lung cancer Paternal Aunt    Liver cancer Paternal Uncle    Colon cancer Maternal Grandfather    Cervical cancer Niece 68       cervical v uterine      The patient has two daughters who are cancer free.  He has a maternal half sister who is cancer free.  His parents are deceased.  His mother had breast cancer possibly in her 74's but definitely in her 70's.  She died of lung cancer.  She had 12 siblings.  One sister had breast cancer and a brother  had brain cancer.  Several siblings had lung cancer and were smokers.  The maternal grandfather had colon cancer.  The patient's father died of lung cancer.  He had three brothers and a sister who died of lung and/or liver cancer.  [name] is unaware of previous family history of genetic testing for hereditary cancer risks. Patient's maternal ancestors are of [place] descent, and paternal ancestors are of [place] descent. There is no reported Ashkenazi Jewish ancestry. There is no known consanguinity.  GENETIC COUNSELING ASSESSMENT: Mr. Luallen is a 56 y.o. male with a personal and family history of cancer which is somewhat suggestive of a hereditary cancer syndrome and predisposition to cancer given the combination of cancer and young age of onset. We, therefore, discussed and recommended the following at today's visit.   DISCUSSION: We discussed that, in general, most cancer is not inherited in families, but instead is sporadic or familial. Sporadic cancers occur by chance and typically happen at older ages (>50 years) as this type of cancer is caused by genetic changes acquired during an individual's lifetime. Some families have more cancers than would be expected by chance; however, the ages or types of cancer are not consistent with a known genetic mutation or known genetic mutations have been ruled out. This type of familial cancer is thought to be due to a combination of multiple genetic, environmental, hormonal, and lifestyle factors. While this combination of factors likely increases the risk of cancer, the exact source of this risk is not currently identifiable or testable.  We discussed that up to 15% of pancreatic cancer is hereditary, with most cases associated with BRCA mutations.  There are other genes that can be associated with hereditary pancreatic cancer syndromes.  These include ATM, PALB2 and Lynch syndrome.  We discussed that testing is beneficial for several reasons including  knowing how to follow individuals after completing their treatment, identifying whether potential treatment options such as PARP inhibitors would be beneficial, and understand if other family members could be at risk for cancer and allow them to undergo genetic testing.   We reviewed the characteristics, features and inheritance patterns of hereditary cancer syndromes. We also discussed genetic testing, including the appropriate family members to test, the process of testing, insurance coverage and turn-around-time for results. We discussed the implications of a negative, positive and/or variant of uncertain significant result. In order to get genetic test results in a timely manner so that Mr. Mich can use these  genetic test results for surgical decisions, we recommended Mr. Pinkerton pursue genetic testing for the BRCAPlus. Once complete, we recommend Mr. Wilber pursue reflex genetic testing to the CancerNext-Expanded+RNAinsight gene panel.   The CancerNext-Expanded gene panel offered by [facility] and includes sequencing and rearrangement analysis for the following 77 genes: AIP, ALK, APC*, ATM*, AXIN2, BAP1, BARD1, BLM, BMPR1A, BRCA1*, BRCA2*, BRIP1*, CDC73, CDH1*, CDK4, CDKN1B, CDKN2A, CHEK2*, CTNNA1, DICER1, FANCC, FH, FLCN, GALNT12, KIF1B, LZTR1, MAX, MEN1, MET, MLH1*, MSH2*, MSH3, MSH6*, MUTYH*, NBN, NF1*, NF2, NTHL1, PALB2*, PHOX2B, PMS2*, POT1, PRKAR1A, PTCH1, PTEN*, RAD51C*, RAD51D*, RB1, RECQL, RET, SDHA, SDHAF2, SDHB, SDHC, SDHD, SMAD4, SMARCA4, SMARCB1, SMARCE1, STK11, SUFU, TMEM127, TP53*, TSC1, TSC2, VHL and XRCC2 (sequencing and deletion/duplication); EGFR, EGLN1, HOXB13, KIT, MITF, PDGFRA, POLD1, and POLE (sequencing only); EPCAM and GREM1 (deletion/duplication only). DNA and RNA analyses performed for * genes.  Based on Mr. Gambale personal and family history of cancer, he meets medical criteria for genetic testing. Despite that he meets criteria, he may still have an out of pocket  cost.   PLAN: After considering the risks, benefits, and limitations, Mr. Dorko provided informed consent to pursue genetic testing and the blood sample was sent to [facility] for analysis of the CancerNext-Expanded+RNAinsight. Results should be available within approximately 2-3 weeks' time, at which point they will be disclosed by telephone to Mr. Cwynar, as will any additional recommendations warranted by these results. Mr. Pizzi will receive a summary of his genetic counseling visit and a copy of his results once available. This information will also be available in Epic.   Lastly, we encouraged Mr. Bilodeau to remain in contact with cancer genetics annually so that we can continuously update the family history and inform him of any changes in cancer genetics and testing that may be of benefit for this family.   Mr. Kidney questions were answered to his satisfaction today. Our contact information was provided should additional questions or concerns arise. Thank you for the referral and allowing [place] to share in the care of your patient.   Jerriah Ines P. Florene Glen, [place], [facility] Licensed, Insurance risk surveyor [email or web address] phone: [phone]  The patient was seen for a total of 35 minutes in face-to-face genetic counseling.  The patient brought his wife This patient was discussed with Drs. Magrinat, Lindi Adie and/or Burr Medico who agrees with the above.    _______________________________________________________________________ For Office Staff:  Number of people involved in session: 2 Was an Intern/ student involved with case: no

## 2022-03-09 ENCOUNTER — Ambulatory Visit (HOSPITAL_COMMUNITY): Payer: BC Managed Care – PPO

## 2022-03-09 ENCOUNTER — Other Ambulatory Visit (HOSPITAL_COMMUNITY): Payer: BC Managed Care – PPO

## 2022-03-09 ENCOUNTER — Other Ambulatory Visit: Payer: Self-pay

## 2022-03-09 ENCOUNTER — Inpatient Hospital Stay: Payer: BC Managed Care – PPO

## 2022-03-09 NOTE — Progress Notes (Signed)
Steve Mills denies chest pain or shortness of breath. Patient denies having any s/s of Covid in his household.  Patient denies any known exposure to Covid.   Steve Mills's PCP is Dr. Lona Kettle.

## 2022-03-10 ENCOUNTER — Other Ambulatory Visit: Payer: Self-pay

## 2022-03-10 ENCOUNTER — Encounter (HOSPITAL_COMMUNITY)
Admission: RE | Disposition: A | Discharge status: Home / Self Care | Payer: Self-pay | Source: Home / Self Care | Attending: Surgery

## 2022-03-10 ENCOUNTER — Ambulatory Visit (HOSPITAL_COMMUNITY): Payer: BC Managed Care – PPO

## 2022-03-10 ENCOUNTER — Ambulatory Visit (HOSPITAL_COMMUNITY): Payer: BC Managed Care – PPO | Admitting: Anesthesiology

## 2022-03-10 ENCOUNTER — Ambulatory Visit (HOSPITAL_COMMUNITY)
Admission: RE | Admit: 2022-03-10 | Discharge: 2022-03-10 | Disposition: A | Discharge status: Home / Self Care | Payer: BC Managed Care – PPO | Attending: Surgery | Admitting: Surgery

## 2022-03-10 ENCOUNTER — Encounter (HOSPITAL_COMMUNITY): Payer: Self-pay | Admitting: Surgery

## 2022-03-10 DIAGNOSIS — Z87891 Personal history of nicotine dependence: Secondary | ICD-10-CM | POA: Insufficient documentation

## 2022-03-10 DIAGNOSIS — Z452 Encounter for adjustment and management of vascular access device: Secondary | ICD-10-CM | POA: Diagnosis not present

## 2022-03-10 DIAGNOSIS — J984 Other disorders of lung: Secondary | ICD-10-CM | POA: Diagnosis not present

## 2022-03-10 DIAGNOSIS — R918 Other nonspecific abnormal finding of lung field: Secondary | ICD-10-CM | POA: Insufficient documentation

## 2022-03-10 DIAGNOSIS — C787 Secondary malignant neoplasm of liver and intrahepatic bile duct: Secondary | ICD-10-CM | POA: Insufficient documentation

## 2022-03-10 DIAGNOSIS — C259 Malignant neoplasm of pancreas, unspecified: Secondary | ICD-10-CM | POA: Diagnosis not present

## 2022-03-10 DIAGNOSIS — C257 Malignant neoplasm of other parts of pancreas: Secondary | ICD-10-CM | POA: Diagnosis not present

## 2022-03-10 DIAGNOSIS — C250 Malignant neoplasm of head of pancreas: Secondary | ICD-10-CM | POA: Diagnosis not present

## 2022-03-10 HISTORY — PX: PORTACATH PLACEMENT: SHX2246

## 2022-03-10 SURGERY — INSERTION, TUNNELED CENTRAL VENOUS DEVICE, WITH PORT
Anesthesia: General

## 2022-03-10 MED ORDER — LIDOCAINE 2% (20 MG/ML) 5 ML SYRINGE
INTRAMUSCULAR | Status: AC
  Filled 2022-03-10: qty 20

## 2022-03-10 MED ORDER — PROPOFOL 10 MG/ML IV BOLUS
INTRAVENOUS | Status: AC
  Filled 2022-03-10: qty 20

## 2022-03-10 MED ORDER — CEFAZOLIN SODIUM-DEXTROSE 2-4 GM/100ML-% IV SOLN
2.0000 g | INTRAVENOUS | Status: AC
  Administered 2022-03-10: 2 g via INTRAVENOUS

## 2022-03-10 MED ORDER — DEXAMETHASONE SODIUM PHOSPHATE 10 MG/ML IJ SOLN
INTRAMUSCULAR | Status: DC | PRN
  Administered 2022-03-10: 10 mg via INTRAVENOUS

## 2022-03-10 MED ORDER — BUPIVACAINE-EPINEPHRINE 0.25% -1:200000 IJ SOLN
INTRAMUSCULAR | Status: DC | PRN
  Administered 2022-03-10: 10 mL

## 2022-03-10 MED ORDER — AMISULPRIDE (ANTIEMETIC) 5 MG/2ML IV SOLN: 10.0000 mg | Freq: Once | INTRAVENOUS | Status: DC | PRN

## 2022-03-10 MED ORDER — BUPIVACAINE-EPINEPHRINE (PF) 0.25% -1:200000 IJ SOLN
INTRAMUSCULAR | Status: AC
  Filled 2022-03-10: qty 30

## 2022-03-10 MED ORDER — HEPARIN 6000 UNIT IRRIGATION SOLUTION
Status: AC
Start: 2022-03-10 — End: ?
  Filled 2022-03-10: qty 500

## 2022-03-10 MED ORDER — MIDAZOLAM HCL 2 MG/2ML IJ SOLN
INTRAMUSCULAR | Status: AC
  Filled 2022-03-10: qty 2

## 2022-03-10 MED ORDER — PROPOFOL 10 MG/ML IV BOLUS
INTRAVENOUS | Status: DC | PRN
  Administered 2022-03-10: 200 mg via INTRAVENOUS

## 2022-03-10 MED ORDER — LACTATED RINGERS IV SOLN: INTRAVENOUS | Status: DC

## 2022-03-10 MED ORDER — CHLORHEXIDINE GLUCONATE 0.12 % MT SOLN: 15.0000 mL | Freq: Once | OROMUCOSAL | Status: AC

## 2022-03-10 MED ORDER — FENTANYL CITRATE (PF) 100 MCG/2ML IJ SOLN: 25.0000 ug | INTRAMUSCULAR | Status: DC | PRN

## 2022-03-10 MED ORDER — MIDAZOLAM HCL 2 MG/2ML IJ SOLN
INTRAMUSCULAR | Status: DC | PRN
  Administered 2022-03-10: 2 mg via INTRAVENOUS

## 2022-03-10 MED ORDER — FENTANYL CITRATE (PF) 250 MCG/5ML IJ SOLN
INTRAMUSCULAR | Status: AC
  Filled 2022-03-10: qty 5

## 2022-03-10 MED ORDER — LIDOCAINE 2% (20 MG/ML) 5 ML SYRINGE
INTRAMUSCULAR | Status: DC | PRN
  Administered 2022-03-10: 100 mg via INTRAVENOUS

## 2022-03-10 MED ORDER — 0.9 % SODIUM CHLORIDE (POUR BTL) OPTIME
TOPICAL | Status: DC | PRN
  Administered 2022-03-10: 1000 mL

## 2022-03-10 MED ORDER — ORAL CARE MOUTH RINSE: 15.0000 mL | Freq: Once | OROMUCOSAL | Status: AC

## 2022-03-10 MED ORDER — ACETAMINOPHEN 500 MG PO TABS
1000.0000 mg | ORAL_TABLET | Freq: Once | ORAL | Status: AC
  Administered 2022-03-10: 1000 mg via ORAL
  Filled 2022-03-10: qty 2

## 2022-03-10 MED ORDER — HEPARIN 6000 UNIT IRRIGATION SOLUTION
Status: DC | PRN
  Administered 2022-03-10: 1

## 2022-03-10 MED ORDER — CHLORHEXIDINE GLUCONATE 0.12 % MT SOLN
OROMUCOSAL | Status: AC
  Administered 2022-03-10: 15 mL via OROMUCOSAL
  Filled 2022-03-10: qty 15

## 2022-03-10 MED ORDER — HEPARIN SOD (PORK) LOCK FLUSH 100 UNIT/ML IV SOLN
INTRAVENOUS | Status: AC
  Filled 2022-03-10: qty 5

## 2022-03-10 MED ORDER — FENTANYL CITRATE (PF) 250 MCG/5ML IJ SOLN
INTRAMUSCULAR | Status: DC | PRN
  Administered 2022-03-10: 25 ug via INTRAVENOUS

## 2022-03-10 MED ORDER — HEPARIN SOD (PORK) LOCK FLUSH 100 UNIT/ML IV SOLN
INTRAVENOUS | Status: DC | PRN
  Administered 2022-03-10: 500 [IU]

## 2022-03-10 MED ORDER — ONDANSETRON HCL 4 MG/2ML IJ SOLN
INTRAMUSCULAR | Status: DC | PRN
  Administered 2022-03-10: 4 mg via INTRAVENOUS

## 2022-03-10 MED FILL — Dexamethasone Sodium Phosphate Inj 100 MG/10ML: INTRAMUSCULAR | Qty: 1 | Status: AC

## 2022-03-10 MED FILL — Fosaprepitant Dimeglumine For IV Infusion 150 MG (Base Eq): INTRAVENOUS | Qty: 5 | Status: AC

## 2022-03-10 SURGICAL SUPPLY — 41 items
ADH SKN CLS APL DERMABOND .7 (GAUZE/BANDAGES/DRESSINGS) ×1
APL PRP STRL LF DISP 70% ISPRP (MISCELLANEOUS) ×1
BAG COUNTER SPONGE SURGICOUNT (BAG) ×2 IMPLANT
BAG DECANTER FOR FLEXI CONT (MISCELLANEOUS) ×2 IMPLANT
BAG SPNG CNTER NS LX DISP (BAG) ×1
CHLORAPREP W/TINT 26 (MISCELLANEOUS) ×2 IMPLANT
COVER SURGICAL LIGHT HANDLE (MISCELLANEOUS) ×2 IMPLANT
COVER TRANSDUCER ULTRASND GEL (DISPOSABLE) IMPLANT
DERMABOND ADVANCED (GAUZE/BANDAGES/DRESSINGS) ×1
DERMABOND ADVANCED .7 DNX12 (GAUZE/BANDAGES/DRESSINGS) ×1 IMPLANT
DRAPE C-ARM 42X120 X-RAY (DRAPES) ×2 IMPLANT
DRAPE LAPAROSCOPIC ABDOMINAL (DRAPES) ×2 IMPLANT
ELECT CAUTERY BLADE 6.4 (BLADE) ×2 IMPLANT
ELECT REM PT RETURN 9FT ADLT (ELECTROSURGICAL) ×2
ELECTRODE REM PT RTRN 9FT ADLT (ELECTROSURGICAL) ×1 IMPLANT
GEL ULTRASOUND 20GR AQUASONIC (MISCELLANEOUS) IMPLANT
GLOVE BIOGEL PI IND STRL 6 (GLOVE) ×1 IMPLANT
GLOVE BIOGEL PI INDICATOR 6 (GLOVE) ×1
GLOVE SURG SYN 5.5 (GLOVE) ×2 IMPLANT
GLOVE SURG SYN 5.5 PF PI (GLOVE) ×1 IMPLANT
GOWN STRL REUS W/ TWL LRG LVL3 (GOWN DISPOSABLE) ×2 IMPLANT
GOWN STRL REUS W/TWL LRG LVL3 (GOWN DISPOSABLE) ×4
INTRODUCER COOK 11FR (CATHETERS) IMPLANT
KIT BASIN OR (CUSTOM PROCEDURE TRAY) ×2 IMPLANT
KIT PORT POWER 8FR ISP CVUE (Port) ×1 IMPLANT
KIT TURNOVER KIT B (KITS) ×2 IMPLANT
NS IRRIG 1000ML POUR BTL (IV SOLUTION) ×2 IMPLANT
PAD ARMBOARD 7.5X6 YLW CONV (MISCELLANEOUS) ×2 IMPLANT
PENCIL BUTTON HOLSTER BLD 10FT (ELECTRODE) ×2 IMPLANT
POSITIONER HEAD DONUT 9IN (MISCELLANEOUS) ×2 IMPLANT
SET INTRODUCER 12FR PACEMAKER (INTRODUCER) IMPLANT
SET SHEATH INTRODUCER 10FR (MISCELLANEOUS) IMPLANT
SHEATH COOK PEEL AWAY SET 9F (SHEATH) IMPLANT
SUT MNCRL AB 4-0 PS2 18 (SUTURE) ×2 IMPLANT
SUT PROLENE 2 0 SH DA (SUTURE) ×4 IMPLANT
SUT VIC AB 3-0 SH 27 (SUTURE) ×2
SUT VIC AB 3-0 SH 27X BRD (SUTURE) ×1 IMPLANT
SYR 5ML LUER SLIP (SYRINGE) ×2 IMPLANT
TOWEL GREEN STERILE (TOWEL DISPOSABLE) ×2 IMPLANT
TOWEL GREEN STERILE FF (TOWEL DISPOSABLE) ×2 IMPLANT
TRAY LAPAROSCOPIC MC (CUSTOM PROCEDURE TRAY) ×2 IMPLANT

## 2022-03-10 NOTE — Discharge Instructions (Signed)
SURGERY DISCHARGE INSTRUCTIONS: PORT-A-CATH PLACEMENT  Activity You may resume your usual activities as tolerated Ok to shower in 24 hours, but do not bathe or submerge incisions underwater. Do not drive while taking narcotic pain medication.  Wound Care Your incision is covered with skin glue called Dermabond. This will peel off on its own over time. You may shower and allow warm soapy water to run over your incisions. Gently pat dry. Do not submerge your incision underwater. Monitor your incision for any new redness, tenderness, or drainage. You may start using your port in 48 hours. Do not apply EMLA cream directly over the Dermabond (skin glue).  When to Call us: Fever greater than 100.5 New redness, drainage, or swelling at incision site Severe pain, nausea, or vomiting Shortness of breath, difficulty breathing  For questions or concerns, please call the [facility] Surgery office at [phone].

## 2022-03-10 NOTE — Op Note (Signed)
Date: 03/10/22  Patient: Steve Mills MRN: [id number]  Preoperative Diagnosis: Stage IV pancreatic adenocarcinoma Postoperative Diagnosis: Same  Procedure: Port-a-cath insertion  Surgeon: Michaelle Birks, MD  EBL: Minimal  Anesthesia: General LMA  Specimens: None  Indications: Steve Mills is a 56 yo male who was recently incidentally found to have liver masses on a screening CT chest. Further workup showed a mass in the head of the pancreas, and biopsy of one of the liver lesions confirmed metastatic adenocarcinoma. He presents today for port placement and is to begin chemotherapy next week.  Findings: 8-Fr single-lumen power port placed via the right subclavian vein. Total catheter length of 17cm.  Procedure details: Informed consent was obtained in the preoperative area prior to the procedure. The patient was brought to the operating room and placed on the table in the supine position. General anesthesia was induced and appropriate lines and drains were placed for intraoperative monitoring. Perioperative antibiotics were administered per SCIP guidelines. The chest and neck was prepped and draped in the usual sterile fashion. A pre-procedure timeout was taken verifying patient identity, surgical site and procedure to be performed.  The patient was placed in Trendelenberg and the right subclavian vein was accessed with a large-bore needle. A guidewire was inserted and advanced, and position in the SVC was confirmed fluoroscopically. The needle was removed and the wire was clipped to the drapes to secure its position. Next a skin incision was made incorporating the wire exit site and a subcutaneous pocket was created with cautery. The port and catheter were then flushed and brought onto the field. Three 2-0 prolene sutures were used to secure the port in the subcutaneous pocket, but the sutures were not tied down. The port was placed in the pocket and the attached cathether was was then  measured using fluoro - it was placed over the skin adjacent to the guidewire, and marked externally at the cavoatrial junction. The catheter was then cut at this location, which was at 17cm. The dilator and sheath were then advanced over the guidewire under fluoroscopic guidance, and the wire and dilator were removed. The end of the catheter was inserted through the sheath and advanced, and the sheath was peeled away. The port was then accessed with a Huber needle, and blood was aspirated and the port was flushed with heparinized saline.  A fluoroscopic imaging confirmed appropriate position of the catheter tip at the cavoatrial junction. The prolene sutures were tied down. A final fluoroscopic image confirmed appropriate position of the catheter tip within the SVC, without kinking of the catheter. A final flush of 500 units heparin (100 units/mL) was given via the port. The skin was closed with a deep dermal layer of interrupted 3-0 Vicryl suture, followed by a running subcuticular 4-0 monocryl suture. Dermabond was applied.  The patient tolerated the procedure well with no apparent complications. All counts were correct x2 at the end of the procedure. The patient was extubated and taken to PACU in stable condition.  Michaelle Birks, MD 03/10/22 1:30 PM

## 2022-03-10 NOTE — Transfer of Care (Signed)
Immediate Anesthesia Transfer of Care Note  Patient: Steve Mills  Procedure(s) Performed: INSERTION PORT-A-CATH  Patient Location: PACU  Anesthesia Type:General  Level of Consciousness: drowsy and patient cooperative  Airway & Oxygen Therapy: Patient Spontanous Breathing  Post-op Assessment: Report given to RN and Post -op Vital signs reviewed and stable  Post vital signs: Reviewed and stable  Last Vitals:  Vitals Value Taken Time  BP 130/81 03/10/22 1337  Temp    Pulse 48 03/10/22 1340  Resp 0 03/10/22 1340  SpO2 93 % 03/10/22 1340  Vitals shown include unvalidated device data.  Last Pain:  Vitals:   03/10/22 0922  TempSrc:   PainSc: 0-No pain      Patients Stated Pain Goal: 3 (27/87/18 3672)  Complications: No notable events documented.

## 2022-03-10 NOTE — Anesthesia Preprocedure Evaluation (Addendum)
Anesthesia Evaluation  Patient identified by MRN, date of birth, ID band Patient awake    Reviewed: Allergy & Precautions, NPO status , Patient's Chart, lab work & pertinent test results  Airway Mallampati: II  TM Distance: >3 FB Neck ROM: Full    Dental  (+) Dental Advisory Given   Pulmonary former smoker,    breath sounds clear to auscultation       Cardiovascular negative cardio ROS   Rhythm:Regular Rate:Normal     Neuro/Psych negative neurological ROS     GI/Hepatic Liver mets Pancreatic CA   Endo/Other  negative endocrine ROS  Renal/GU negative Renal ROS     Musculoskeletal   Abdominal   Peds  Hematology negative hematology ROS (+)   Anesthesia Other Findings   Reproductive/Obstetrics                             Lab Results  Component Value Date   WBC 6.9 03/08/2022   HGB 14.8 03/08/2022   HCT 42.1 03/08/2022   MCV 86.1 03/08/2022   PLT 275 03/08/2022   Lab Results  Component Value Date   CREATININE 0.85 03/08/2022   BUN 12 03/08/2022   NA 140 03/08/2022   K 4.2 03/08/2022   CL 106 03/08/2022   CO2 29 03/08/2022    Anesthesia Physical Anesthesia Plan  ASA: 3  Anesthesia Plan: General   Post-op Pain Management: Tylenol PO (pre-op)* and Minimal or no pain anticipated   Induction: Intravenous  PONV Risk Score and Plan: 2 and Dexamethasone, Ondansetron and Treatment may vary due to age or medical condition  Airway Management Planned: LMA  Additional Equipment: None  Intra-op Plan:   Post-operative Plan: Extubation in OR  Informed Consent: I have reviewed the patients History and Physical, chart, labs and discussed the procedure including the risks, benefits and alternatives for the proposed anesthesia with the patient or authorized representative who has indicated his/her understanding and acceptance.     Dental advisory given  Plan Discussed with:  CRNA  Anesthesia Plan Comments:        Anesthesia Quick Evaluation

## 2022-03-10 NOTE — Anesthesia Procedure Notes (Signed)
Procedure Name: LMA Insertion Date/Time: 03/10/2022 12:41 PM  Performed by: Thelma Comp, CRNAPre-anesthesia Checklist: Patient identified, Emergency Drugs available, Suction available and Patient being monitored Patient Re-evaluated:Patient Re-evaluated prior to induction Oxygen Delivery Method: Circle System Utilized Preoxygenation: Pre-oxygenation with 100% oxygen Induction Type: IV induction Ventilation: Mask ventilation without difficulty LMA: LMA inserted LMA Size: 5.0 Number of attempts: 1 Placement Confirmation: positive ETCO2 Tube secured with: Tape Dental Injury: Teeth and Oropharynx as per pre-operative assessment

## 2022-03-10 NOTE — H&P (Signed)
History of Present Illness: Steve Mills is a 56 y.o. male who was referred to me for evaluation of pancreatic cancer. He recently underwent a CT of the chest on 01/19/22 for lung cancer screening, which incidentally showed a mass in the liver. He then had a liver MRI on 6/30 for further workup, which showed a mass in the uncinate process of the pancreas with multiple malignant-appearing liver lesions, consistent with pancreatic adenocarcinoma with liver metastases. He was referred to me and to Dr. Burr Medico by his PCP. Recent liver biopsy confirmed metastatic adenocarcinoma. He is here today for port placement and is to begin chemotherapy next week.  He denies any symptoms. He has not had any abdominal pain, loss of appetite, or unplanned weight loss. He has not noticed any jaundice, darkening of his urine, or change in stool color. He has no known family history of pancreatic cancer.   Review of Systems: A complete review of systems was obtained from the patient. I have reviewed this information and discussed as appropriate with the patient. See HPI as well for other ROS.  Medical History: History reviewed. No pertinent past medical history.  Patient Active Problem List  Diagnosis  Mass of pancreas  Liver masses   Past Surgical History:  Procedure Laterality Date  APPENDECTOMY  gall bladder removal surgery  sinus polyps  umbilical hernia surgery N/A    No Known Allergies  No current outpatient medications on file prior to visit.   No current facility-administered medications on file prior to visit.   Family History  Problem Relation Age of Onset  Skin cancer Mother  Hyperlipidemia (Elevated cholesterol) Mother  Breast cancer Mother  Diabetes Father    Social History   Tobacco Use  Smoking Status Former  Types: Cigarettes  Smokeless Tobacco Never    Social History   Socioeconomic History  Marital status: Married  Tobacco Use  Smoking status: Former  Types: Cigarettes   Smokeless tobacco: Never  Vaping Use  Vaping Use: Never used  Substance and Sexual Activity  Alcohol use: Never  Drug use: Never   Objective:   Vitals:  02/24/22 1124  BP: (!) 118/90  Pulse: 77  Temp: 36.8 C (98.3 F)  SpO2: 93%  Weight: (!) 113.9 kg (251 lb)  Height: 182.9 cm (6')   Body mass index is 34.04 kg/m.  Physical Exam Vitals reviewed.  Constitutional:  General: He is not in acute distress. Appearance: Normal appearance.  HENT:  Head: Normocephalic and atraumatic.  Cardiovascular:  Rate and Rhythm: Normal rate and regular rhythm.  Heart sounds: No murmur heard. Pulmonary:  Effort: Pulmonary effort is normal. No respiratory distress.  Breath sounds: Normal breath sounds. No wheezing.  Abdominal:  General: There is no distension.  Palpations: Abdomen is soft.  Tenderness: There is no abdominal tenderness.  Comments: No surgical scars.  Musculoskeletal:  General: Normal range of motion.  Skin: General: Skin is warm and dry.  Coloration: Skin is not jaundiced.  Neurological:  General: No focal deficit present.  Mental Status: He is alert and oriented to person, place, and time.  Psychiatric:  Mood and Affect: Mood normal.  Behavior: Behavior normal.  Thought Content: Thought content normal.    Labs, Imaging and Diagnostic Testing: MRI liver 02/17/22: IMPRESSION: 3 cm mass in the pancreatic uncinate process, highly suspicious for pancreatic adenocarcinoma. No evidence of vascular involvement.  Two 8-9 mm peripherally enhancing lymph nodes along the anterior aspect of the pancreatic head, suspicious for local lymph node metastases.  Multiple hypovascular liver masses, consistent with liver metastases.  Assessment and Plan:  Diagnoses and all orders for this visit:  Mass of pancreas  Liver masses    This is a 56 yo male with lpancreatic adenocarcinoma with metastatic disease to the liver. He begins chemotherapy this coming Monday 7/24.  Proceed to OR for portacath insertion. I reviewed the details of the procedure including the benefits and risk of pneumothorax. He agrees to proceed.  Michaelle Birks, [facility], Hepatobiliary and Pancreatic Surgery 03/10/22 11:58 AM

## 2022-03-11 NOTE — Anesthesia Postprocedure Evaluation (Signed)
Anesthesia Post Note  Patient: Steve Mills  Procedure(s) Performed: INSERTION PORT-A-CATH     Patient location during evaluation: PACU Anesthesia Type: General Level of consciousness: awake and alert Pain management: pain level controlled Vital Signs Assessment: post-procedure vital signs reviewed and stable Respiratory status: spontaneous breathing, nonlabored ventilation, respiratory function stable and patient connected to nasal cannula oxygen Cardiovascular status: blood pressure returned to baseline and stable Postop Assessment: no apparent nausea or vomiting Anesthetic complications: no   No notable events documented.  Last Vitals:  Vitals:   03/10/22 1345 03/10/22 1400  BP: 127/81 106/78  Pulse: (!) 56 (!) 43  Resp: 19 13  Temp:  (!) 36.4 C  SpO2: 96% 94%    Last Pain:  Vitals:   03/10/22 1400  TempSrc:   PainSc: 0-No pain                 Tiajuana Amass

## 2022-03-13 ENCOUNTER — Inpatient Hospital Stay: Payer: BC Managed Care – PPO

## 2022-03-13 ENCOUNTER — Encounter: Payer: Self-pay | Admitting: Hematology

## 2022-03-13 ENCOUNTER — Encounter: Payer: Self-pay | Admitting: *Deleted

## 2022-03-13 ENCOUNTER — Inpatient Hospital Stay: Payer: BC Managed Care – PPO | Admitting: Hematology

## 2022-03-13 ENCOUNTER — Other Ambulatory Visit: Payer: Self-pay

## 2022-03-13 VITALS — BP 130/78 | HR 58 | Temp 98.3°F | Resp 17 | Wt 244.5 lb

## 2022-03-13 DIAGNOSIS — C259 Malignant neoplasm of pancreas, unspecified: Secondary | ICD-10-CM | POA: Diagnosis not present

## 2022-03-13 DIAGNOSIS — Z5111 Encounter for antineoplastic chemotherapy: Secondary | ICD-10-CM | POA: Diagnosis not present

## 2022-03-13 DIAGNOSIS — R160 Hepatomegaly, not elsewhere classified: Secondary | ICD-10-CM | POA: Diagnosis not present

## 2022-03-13 DIAGNOSIS — C787 Secondary malignant neoplasm of liver and intrahepatic bile duct: Secondary | ICD-10-CM

## 2022-03-13 DIAGNOSIS — C250 Malignant neoplasm of head of pancreas: Secondary | ICD-10-CM | POA: Diagnosis not present

## 2022-03-13 DIAGNOSIS — Z452 Encounter for adjustment and management of vascular access device: Secondary | ICD-10-CM | POA: Diagnosis not present

## 2022-03-13 MED ORDER — SODIUM CHLORIDE 0.9 % IV SOLN
2400.0000 mg/m2 | INTRAVENOUS | Status: DC
  Administered 2022-03-13: 5600 mg via INTRAVENOUS
  Filled 2022-03-13: qty 112

## 2022-03-13 MED ORDER — HEPARIN SOD (PORK) LOCK FLUSH 100 UNIT/ML IV SOLN: 500.0000 [IU] | Freq: Once | INTRAVENOUS | Status: DC | PRN

## 2022-03-13 MED ORDER — SODIUM CHLORIDE 0.9 % IV SOLN
150.0000 mg/m2 | Freq: Once | INTRAVENOUS | Status: AC
  Administered 2022-03-13: 360 mg via INTRAVENOUS
  Filled 2022-03-13: qty 5

## 2022-03-13 MED ORDER — DEXTROSE 5 % IV SOLN: Freq: Once | INTRAVENOUS | Status: AC

## 2022-03-13 MED ORDER — PALONOSETRON HCL INJECTION 0.25 MG/5ML
0.2500 mg | Freq: Once | INTRAVENOUS | Status: AC
  Administered 2022-03-13: 0.25 mg via INTRAVENOUS
  Filled 2022-03-13: qty 5

## 2022-03-13 MED ORDER — SODIUM CHLORIDE 0.9 % IV SOLN
400.0000 mg/m2 | Freq: Once | INTRAVENOUS | Status: AC
  Administered 2022-03-13: 936 mg via INTRAVENOUS
  Filled 2022-03-13: qty 46.8

## 2022-03-13 MED ORDER — OXALIPLATIN CHEMO INJECTION 100 MG/20ML
85.0000 mg/m2 | Freq: Once | INTRAVENOUS | Status: AC
  Administered 2022-03-13: 200 mg via INTRAVENOUS
  Filled 2022-03-13: qty 40

## 2022-03-13 MED ORDER — SODIUM CHLORIDE 0.9% FLUSH
10.0000 mL | INTRAVENOUS | Status: DC | PRN
  Administered 2022-03-13: 10 mL

## 2022-03-13 MED ORDER — SODIUM CHLORIDE 0.9 % IV SOLN
150.0000 mg | Freq: Once | INTRAVENOUS | Status: AC
  Administered 2022-03-13: 150 mg via INTRAVENOUS
  Filled 2022-03-13: qty 150

## 2022-03-13 MED ORDER — SODIUM CHLORIDE 0.9 % IV SOLN
10.0000 mg | Freq: Once | INTRAVENOUS | Status: AC
  Administered 2022-03-13: 10 mg via INTRAVENOUS
  Filled 2022-03-13: qty 10

## 2022-03-13 MED ORDER — ATROPINE SULFATE 1 MG/ML IV SOLN
0.5000 mg | Freq: Once | INTRAVENOUS | Status: DC | PRN
  Filled 2022-03-13: qty 1

## 2022-03-13 NOTE — Patient Instructions (Signed)
[facility]  Discharge Instructions: Thank you for choosing [facility] to provide your oncology and hematology care.   If you have a lab appointment with the [facility], please go directly to the [facility] and check in at the registration area.   Wear comfortable clothing and clothing appropriate for easy access to any Portacath or PICC line.   We strive to give you quality time with your provider. You may need to reschedule your appointment if you arrive late (15 or more minutes).  Arriving late affects you and other patients whose appointments are after yours.  Also, if you miss three or more appointments without notifying the office, you may be dismissed from the clinic at the provider's discretion.      For prescription refill requests, have your pharmacy contact our office and allow 72 hours for refills to be completed.    Today you received the following chemotherapy and/or immunotherapy agents: Oxaliplatin, leucovorin, irinotecan, fluorourcil      To help prevent nausea and vomiting after your treatment, we encourage you to take your nausea medication as directed.  BELOW ARE SYMPTOMS THAT SHOULD BE REPORTED IMMEDIATELY: *FEVER GREATER THAN 100.4 F (38 C) OR HIGHER *CHILLS OR SWEATING *NAUSEA AND VOMITING THAT IS NOT CONTROLLED WITH YOUR NAUSEA MEDICATION *UNUSUAL SHORTNESS OF BREATH *UNUSUAL BRUISING OR BLEEDING *URINARY PROBLEMS (pain or burning when urinating, or frequent urination) *BOWEL PROBLEMS (unusual diarrhea, constipation, pain near the anus) TENDERNESS IN MOUTH AND THROAT WITH OR WITHOUT PRESENCE OF ULCERS (sore throat, sores in mouth, or a toothache) UNUSUAL RASH, SWELLING OR PAIN  UNUSUAL VAGINAL DISCHARGE OR ITCHING   Items with * indicate a potential emergency and should be followed up as soon as possible or go to the Emergency Department if any problems should occur.  Please show the CHEMOTHERAPY ALERT CARD or  IMMUNOTHERAPY ALERT CARD at check-in to the Emergency Department and triage nurse.  Should you have questions after your visit or need to cancel or reschedule your appointment, please contact [facility]  Dept: [phone]  and follow the prompts.  Office hours are 8:00 a.m. to 4:30 p.m. Monday - Friday. Please note that voicemails left after 4:00 p.m. may not be returned until the following business day.  We are closed weekends and major holidays. You have access to a nurse at all times for urgent questions. Please call the main number to the clinic Dept: [phone] and follow the prompts.   For any non-urgent questions, you may also contact your provider using MyChart. We now offer e-Visits for anyone 67 and older to request care online for non-urgent symptoms. For details visit [email or web address].   Also download the MyChart app! Go to the app store, search "MyChart", open the app, select , and log in with your MyChart username and password.  Masks are optional in the cancer centers. If you would like for your care team to wear a mask while they are taking care of you, please let them know. For doctor visits, patients may have with them one support person who is at least 57 years old. At this time, visitors are not allowed in the infusion area. Oxaliplatin Injection What is this medication? OXALIPLATIN (ox AL i PLA tin) is a chemotherapy drug. It targets fast dividing cells, like cancer cells, and causes these cells to die. This medicine is used to treat cancers of the colon and rectum, and many other cancers. This medicine  may be used for other purposes; ask your health care provider or pharmacist if you have questions. COMMON BRAND NAME(S): Eloxatin What should I tell my care team before I take this medication? They need to know if you have any of these conditions: heart disease history of irregular heartbeat liver disease low blood counts,  like white cells, platelets, or red blood cells lung or breathing disease, like asthma take medicines that treat or prevent blood clots tingling of the fingers or toes, or other nerve disorder an unusual or allergic reaction to oxaliplatin, other chemotherapy, other medicines, foods, dyes, or preservatives pregnant or trying to get pregnant breast-feeding How should I use this medication? This drug is given as an infusion into a vein. It is administered in a hospital or clinic by a specially trained health care professional. Talk to your pediatrician regarding the use of this medicine in children. Special care may be needed. Overdosage: If you think you have taken too much of this medicine contact a poison control center or emergency room at once. NOTE: This medicine is only for you. Do not share this medicine with others. What if I miss a dose? It is important not to miss a dose. Call your doctor or health care professional if you are unable to keep an appointment. What may interact with this medication? Do not take this medicine with any of the following medications: cisapride dronedarone pimozide thioridazine This medicine may also interact with the following medications: aspirin and aspirin-like medicines certain medicines that treat or prevent blood clots like warfarin, apixaban, dabigatran, and rivaroxaban cisplatin cyclosporine diuretics medicines for infection like acyclovir, adefovir, amphotericin B, bacitracin, cidofovir, foscarnet, ganciclovir, gentamicin, pentamidine, vancomycin NSAIDs, medicines for pain and inflammation, like ibuprofen or naproxen other medicines that prolong the QT interval (an abnormal heart rhythm) pamidronate zoledronic acid This list may not describe all possible interactions. Give your health care provider a list of all the medicines, herbs, non-prescription drugs, or dietary supplements you use. Also tell them if you smoke, drink alcohol, or use  illegal drugs. Some items may interact with your medicine. What should I watch for while using this medication? Your condition will be monitored carefully while you are receiving this medicine. You may need blood work done while you are taking this medicine. This medicine may make you feel generally unwell. This is not uncommon as chemotherapy can affect healthy cells as well as cancer cells. Report any side effects. Continue your course of treatment even though you feel ill unless your healthcare professional tells you to stop. This medicine can make you more sensitive to cold. Do not drink cold drinks or use ice. Cover exposed skin before coming in contact with cold temperatures or cold objects. When out in cold weather wear warm clothing and cover your mouth and nose to warm the air that goes into your lungs. Tell your doctor if you get sensitive to the cold. Do not become pregnant while taking this medicine or for 9 months after stopping it. Women should inform their health care professional if they wish to become pregnant or think they might be pregnant. Men should not father a child while taking this medicine and for 6 months after stopping it. There is potential for serious side effects to an unborn child. Talk to your health care professional for more information. Do not breast-feed a child while taking this medicine or for 3 months after stopping it. This medicine has caused ovarian failure in some women. This medicine may  make it more difficult to get pregnant. Talk to your health care professional if you are concerned about your fertility. This medicine has caused decreased sperm counts in some men. This may make it more difficult to father a child. Talk to your health care professional if you are concerned about your fertility. This medicine may increase your risk of getting an infection. Call your health care professional for advice if you get a fever, chills, or sore throat, or other symptoms  of a cold or flu. Do not treat yourself. Try to avoid being around people who are sick. Avoid taking medicines that contain aspirin, acetaminophen, ibuprofen, naproxen, or ketoprofen unless instructed by your health care professional. These medicines may hide a fever. Be careful brushing or flossing your teeth or using a toothpick because you may get an infection or bleed more easily. If you have any dental work done, tell your dentist you are receiving this medicine. What side effects may I notice from receiving this medication? Side effects that you should report to your doctor or health care professional as soon as possible: allergic reactions like skin rash, itching or hives, swelling of the face, lips, or tongue breathing problems cough low blood counts - this medicine may decrease the number of white blood cells, red blood cells, and platelets. You may be at increased risk for infections and bleeding nausea, vomiting pain, redness, or irritation at site where injected pain, tingling, numbness in the hands or feet signs and symptoms of bleeding such as bloody or black, tarry stools; red or dark brown urine; spitting up blood or brown material that looks like coffee grounds; red spots on the skin; unusual bruising or bleeding from the eyes, gums, or nose signs and symptoms of a dangerous change in heartbeat or heart rhythm like chest pain; dizziness; fast, irregular heartbeat; palpitations; feeling faint or lightheaded; falls signs and symptoms of infection like fever; chills; cough; sore throat; pain or trouble passing urine signs and symptoms of liver injury like dark yellow or brown urine; general ill feeling or flu-like symptoms; light-colored stools; loss of appetite; nausea; right upper belly pain; unusually weak or tired; yellowing of the eyes or skin signs and symptoms of low red blood cells or anemia such as unusually weak or tired; feeling faint or lightheaded; falls signs and symptoms  of muscle injury like dark urine; trouble passing urine or change in the amount of urine; unusually weak or tired; muscle pain; back pain Side effects that usually do not require medical attention (report to your doctor or health care professional if they continue or are bothersome): changes in taste diarrhea gas hair loss loss of appetite mouth sores This list may not describe all possible side effects. Call your doctor for medical advice about side effects. You may report side effects to FDA at 1-800-FDA-1088. Where should I keep my medication? This drug is given in a hospital or clinic and will not be stored at home. NOTE: This sheet is a summary. It may not cover all possible information. If you have questions about this medicine, talk to your doctor, pharmacist, or health care provider.  2023 Elsevier/Gold Standard (2021-07-08 00:00:00) Leucovorin injection What is this medication? LEUCOVORIN (loo koe VOR in) is used to prevent or treat the harmful effects of some medicines. This medicine is used to treat anemia caused by a low amount of folic acid in the body. It is also used with 5-fluorouracil (5-FU) to treat colon cancer. This medicine may be used  for other purposes; ask your health care provider or pharmacist if you have questions. What should I tell my care team before I take this medication? They need to know if you have any of these conditions: anemia from low levels of vitamin B-12 in the blood an unusual or allergic reaction to leucovorin, folic acid, other medicines, foods, dyes, or preservatives pregnant or trying to get pregnant breast-feeding How should I use this medication? This medicine is for injection into a muscle or into a vein. It is given by a health care professional in a hospital or clinic setting. Talk to your pediatrician regarding the use of this medicine in children. Special care may be needed. Overdosage: If you think you have taken too much of this  medicine contact a poison control center or emergency room at once. NOTE: This medicine is only for you. Do not share this medicine with others. What if I miss a dose? This does not apply. What may interact with this medication? capecitabine fluorouracil phenobarbital phenytoin primidone trimethoprim-sulfamethoxazole This list may not describe all possible interactions. Give your health care provider a list of all the medicines, herbs, non-prescription drugs, or dietary supplements you use. Also tell them if you smoke, drink alcohol, or use illegal drugs. Some items may interact with your medicine. What should I watch for while using this medication? Your condition will be monitored carefully while you are receiving this medicine. This medicine may increase the side effects of 5-fluorouracil, 5-FU. Tell your doctor or health care professional if you have diarrhea or mouth sores that do not get better or that get worse. What side effects may I notice from receiving this medication? Side effects that you should report to your doctor or health care professional as soon as possible: allergic reactions like skin rash, itching or hives, swelling of the face, lips, or tongue breathing problems fever, infection mouth sores unusual bleeding or bruising unusually weak or tired Side effects that usually do not require medical attention (report to your doctor or health care professional if they continue or are bothersome): constipation or diarrhea loss of appetite nausea, vomiting This list may not describe all possible side effects. Call your doctor for medical advice about side effects. You may report side effects to FDA at 1-800-FDA-1088. Where should I keep my medication? This drug is given in a hospital or clinic and will not be stored at home. NOTE: This sheet is a summary. It may not cover all possible information. If you have questions about this medicine, talk to your doctor, pharmacist,  or health care provider.  2023 Elsevier/Gold Standard (2008-02-13 00:00:00) Irinotecan injection What is this medication? IRINOTECAN (ir in oh TEE kan ) is a chemotherapy drug. It is used to treat colon and rectal cancer. This medicine may be used for other purposes; ask your health care provider or pharmacist if you have questions. COMMON BRAND NAME(S): Camptosar What should I tell my care team before I take this medication? They need to know if you have any of these conditions: dehydration diarrhea infection (especially a virus infection such as chickenpox, cold sores, or herpes) liver disease low blood counts, like low white cell, platelet, or red cell counts low levels of calcium, magnesium, or potassium in the blood recent or ongoing radiation therapy an unusual or allergic reaction to irinotecan, other medicines, foods, dyes, or preservatives pregnant or trying to get pregnant breast-feeding How should I use this medication? This drug is given as an infusion into a vein.  It is administered in a hospital or clinic by a specially trained health care professional. Talk to your pediatrician regarding the use of this medicine in children. Special care may be needed. Overdosage: If you think you have taken too much of this medicine contact a poison control center or emergency room at once. NOTE: This medicine is only for you. Do not share this medicine with others. What if I miss a dose? It is important not to miss your dose. Call your doctor or health care professional if you are unable to keep an appointment. What may interact with this medication? Do not take this medicine with any of the following medications: cobicistat itraconazole This medicine may interact with the following medications: antiviral medicines for HIV or AIDS certain antibiotics like rifampin or rifabutin certain medicines for fungal infections like ketoconazole, posaconazole, and voriconazole certain  medicines for seizures like carbamazepine, phenobarbital, phenotoin clarithromycin gemfibrozil nefazodone St. John's Wort This list may not describe all possible interactions. Give your health care provider a list of all the medicines, herbs, non-prescription drugs, or dietary supplements you use. Also tell them if you smoke, drink alcohol, or use illegal drugs. Some items may interact with your medicine. What should I watch for while using this medication? Your condition will be monitored carefully while you are receiving this medicine. You will need important blood work done while you are taking this medicine. This drug may make you feel generally unwell. This is not uncommon, as chemotherapy can affect healthy cells as well as cancer cells. Report any side effects. Continue your course of treatment even though you feel ill unless your doctor tells you to stop. In some cases, you may be given additional medicines to help with side effects. Follow all directions for their use. You may get drowsy or dizzy. Do not drive, use machinery, or do anything that needs mental alertness until you know how this medicine affects you. Do not stand or sit up quickly, especially if you are an older patient. This reduces the risk of dizzy or fainting spells. Call your health care professional for advice if you get a fever, chills, or sore throat, or other symptoms of a cold or flu. Do not treat yourself. This medicine decreases your body's ability to fight infections. Try to avoid being around people who are sick. Avoid taking products that contain aspirin, acetaminophen, ibuprofen, naproxen, or ketoprofen unless instructed by your doctor. These medicines may hide a fever. This medicine may increase your risk to bruise or bleed. Call your doctor or health care professional if you notice any unusual bleeding. Be careful brushing and flossing your teeth or using a toothpick because you may get an infection or bleed more  easily. If you have any dental work done, tell your dentist you are receiving this medicine. Do not become pregnant while taking this medicine or for 6 months after stopping it. Women should inform their health care professional if they wish to become pregnant or think they might be pregnant. Men should not father a child while taking this medicine and for 3 months after stopping it. There is potential for serious side effects to an unborn child. Talk to your health care professional for more information. Do not breast-feed an infant while taking this medicine or for 7 days after stopping it. This medicine has caused ovarian failure in some women. This medicine may make it more difficult to get pregnant. Talk to your health care professional if you are concerned about your  fertility. This medicine has caused decreased sperm counts in some men. This may make it more difficult to father a child. Talk to your health care professional if you are concerned about your fertility. What side effects may I notice from receiving this medication? Side effects that you should report to your doctor or health care professional as soon as possible: allergic reactions like skin rash, itching or hives, swelling of the face, lips, or tongue chest pain diarrhea flushing, runny nose, sweating during infusion low blood counts - this medicine may decrease the number of white blood cells, red blood cells and platelets. You may be at increased risk for infections and bleeding. nausea, vomiting pain, swelling, warmth in the leg signs of decreased platelets or bleeding - bruising, pinpoint red spots on the skin, black, tarry stools, blood in the urine signs of infection - fever or chills, cough, sore throat, pain or difficulty passing urine signs of decreased red blood cells - unusually weak or tired, fainting spells, lightheadedness Side effects that usually do not require medical attention (report to your doctor or health  care professional if they continue or are bothersome): constipation hair loss headache loss of appetite mouth sores stomach pain This list may not describe all possible side effects. Call your doctor for medical advice about side effects. You may report side effects to FDA at 1-800-FDA-1088. Where should I keep my medication? This drug is given in a hospital or clinic and will not be stored at home. NOTE: This sheet is a summary. It may not cover all possible information. If you have questions about this medicine, talk to your doctor, pharmacist, or health care provider.  2023 Elsevier/Gold Standard (2021-07-08 00:00:00) Fluorouracil, 5-FU injection What is this medication? FLUOROURACIL, 5-FU (flure oh YOOR a sil) is a chemotherapy drug. It slows the growth of cancer cells. This medicine is used to treat many types of cancer like breast cancer, colon or rectal cancer, pancreatic cancer, and stomach cancer. This medicine may be used for other purposes; ask your health care provider or pharmacist if you have questions. COMMON BRAND NAME(S): Adrucil What should I tell my care team before I take this medication? They need to know if you have any of these conditions: blood disorders dihydropyrimidine dehydrogenase (DPD) deficiency infection (especially a virus infection such as chickenpox, cold sores, or herpes) kidney disease liver disease malnourished, poor nutrition recent or ongoing radiation therapy an unusual or allergic reaction to fluorouracil, other chemotherapy, other medicines, foods, dyes, or preservatives pregnant or trying to get pregnant breast-feeding How should I use this medication? This drug is given as an infusion or injection into a vein. It is administered in a hospital or clinic by a specially trained health care professional. Talk to your pediatrician regarding the use of this medicine in children. Special care may be needed. Overdosage: If you think you have taken  too much of this medicine contact a poison control center or emergency room at once. NOTE: This medicine is only for you. Do not share this medicine with others. What if I miss a dose? It is important not to miss your dose. Call your doctor or health care professional if you are unable to keep an appointment. What may interact with this medication? Do not take this medicine with any of the following medications: live virus vaccines This medicine may also interact with the following medications: medicines that treat or prevent blood clots like warfarin, enoxaparin, and dalteparin This list may not describe all possible  interactions. Give your health care provider a list of all the medicines, herbs, non-prescription drugs, or dietary supplements you use. Also tell them if you smoke, drink alcohol, or use illegal drugs. Some items may interact with your medicine. What should I watch for while using this medication? Visit your doctor for checks on your progress. This drug may make you feel generally unwell. This is not uncommon, as chemotherapy can affect healthy cells as well as cancer cells. Report any side effects. Continue your course of treatment even though you feel ill unless your doctor tells you to stop. In some cases, you may be given additional medicines to help with side effects. Follow all directions for their use. Call your doctor or health care professional for advice if you get a fever, chills or sore throat, or other symptoms of a cold or flu. Do not treat yourself. This drug decreases your body's ability to fight infections. Try to avoid being around people who are sick. This medicine may increase your risk to bruise or bleed. Call your doctor or health care professional if you notice any unusual bleeding. Be careful brushing and flossing your teeth or using a toothpick because you may get an infection or bleed more easily. If you have any dental work done, tell your dentist you are  receiving this medicine. Avoid taking products that contain aspirin, acetaminophen, ibuprofen, naproxen, or ketoprofen unless instructed by your doctor. These medicines may hide a fever. Do not become pregnant while taking this medicine. Women should inform their doctor if they wish to become pregnant or think they might be pregnant. There is a potential for serious side effects to an unborn child. Talk to your health care professional or pharmacist for more information. Do not breast-feed an infant while taking this medicine. Men should inform their doctor if they wish to father a child. This medicine may lower sperm counts. Do not treat diarrhea with over the counter products. Contact your doctor if you have diarrhea that lasts more than 2 days or if it is severe and watery. This medicine can make you more sensitive to the sun. Keep out of the sun. If you cannot avoid being in the sun, wear protective clothing and use sunscreen. Do not use sun lamps or tanning beds/booths. What side effects may I notice from receiving this medication? Side effects that you should report to your doctor or health care professional as soon as possible: allergic reactions like skin rash, itching or hives, swelling of the face, lips, or tongue low blood counts - this medicine may decrease the number of white blood cells, red blood cells and platelets. You may be at increased risk for infections and bleeding. signs of infection - fever or chills, cough, sore throat, pain or difficulty passing urine signs of decreased platelets or bleeding - bruising, pinpoint red spots on the skin, black, tarry stools, blood in the urine signs of decreased red blood cells - unusually weak or tired, fainting spells, lightheadedness breathing problems changes in vision chest pain mouth sores nausea and vomiting pain, swelling, redness at site where injected pain, tingling, numbness in the hands or feet redness, swelling, or sores on  hands or feet stomach pain unusual bleeding Side effects that usually do not require medical attention (report to your doctor or health care professional if they continue or are bothersome): changes in finger or toe nails diarrhea dry or itchy skin hair loss headache loss of appetite sensitivity of eyes to the light stomach upset  unusually teary eyes This list may not describe all possible side effects. Call your doctor for medical advice about side effects. You may report side effects to FDA at 1-800-FDA-1088. Where should I keep my medication? This drug is given in a hospital or clinic and will not be stored at home. NOTE: This sheet is a summary. It may not cover all possible information. If you have questions about this medicine, talk to your doctor, pharmacist, or health care provider.  2023 Elsevier/Gold Standard (2021-07-08 00:00:00)

## 2022-03-13 NOTE — Progress Notes (Signed)
[place]   [phone] [phone]   Clinic Follow up Note   Patient Care Team: [name], MD as PCP - General (Family Medicine) Truitt Merle, MD as Consulting Physician (Oncology) Royston Bake, RN as Oncology Nurse Navigator (Oncology)  Date of Service:  03/13/2022  CHIEF COMPLAINT: f/u of metastatic pancreatic cancer  CURRENT THERAPY:  FOLFIRINOX, q14d, starting 03/13/22  ASSESSMENT & PLAN:  Steve Mills is a 56 y.o. male with   1. Pancreatic Head Adenocarcinoma, metastatic to liver, stage IV, MMR normal  -incidental finding on lung cancer screening chest CT on 01/19/22. Liver MRI on 02/17/22 showed: 3 cm mass in pancreatic uncinate; two lymph nodes along anterior pancreatic head, and multiple multiple hypovascular (4) liver masses.  -liver biopsy on 02/28/22 confirmed poorly differentiated adenocarcinoma, compatible with clinical suspicion of pancreatic adenocarcinoma, MMR normal. -he underwent port placement on 03/10/22 in anticipation of beginning FOLFIRINOX today, 03/13/22. I briefly reviewed side effects to anticipate this week with them. -tumor MMR normal, not a candidate for immunotherapy. I have requested FO, result pending -genetic testing result is also pending  -labs from 03/08/22 reviewed, WNL, adequate to begin treatment today. I reviewed potential AE and management with him and his wife again today, he knows to call if he has concerns after chemo    2. Genetics -he has a strong family history of cancer, specifically liver and lung on the paternal side and breast and colon on the maternal side. -testing performed 03/08/22, results are pending.  3. Goal of care discussion  -We previously discussed the incurable nature of his cancer, and the overall poor prognosis, especially if he does not have good response to chemotherapy or progress on chemo -The patient understands the goal of care is palliative. -he is full code now       PLAN:  -proceed with first FOLFIRINOX today, with GCSF on day 3  -lab, flush, f/u, and C2 FOLFIRINOX in 2 weeks   No problem-specific Assessment & Plan notes found for this encounter.   SUMMARY OF ONCOLOGIC HISTORY: Oncology History  Pancreatic cancer metastasized to liver [facility])  02/28/2022 Cancer Staging   Staging form: Exocrine Pancreas, AJCC 8th Edition - Clinical stage from 02/28/2022: Stage IV (cT2, cN1, pM1) - Signed by Truitt Merle, MD on 03/02/2022 Stage prefix: Initial diagnosis   03/02/2022 Initial Diagnosis   Pancreatic cancer metastasized to liver ([place])   03/13/2022 -  Chemotherapy   Patient is on Treatment Plan : PANCREAS Modified FOLFIRINOX q14d x 4 cycles        INTERVAL HISTORY:  Steve Mills is here for a follow up of metastatic pancreatic cancer. He was last seen by me on 03/02/22. He presents to the clinic accompanied by his wife. He reports he is doing well overall, though nervous.   All other systems were reviewed with the patient and are negative.  MEDICAL HISTORY:  Past Medical History:  Diagnosis Date   Family history of adverse reaction to anesthesia    mother allergic to ether    Family history of breast cancer    Family history of colon cancer    Pneumonia    hx of 2014     SURGICAL HISTORY: Past Surgical History:  Procedure Laterality Date   APPENDECTOMY     CHOLECYSTECTOMY N/A 07/12/2018   Procedure: LAPAROSCOPIC CHOLECYSTECTOMY WITH INTRAOPERATIVE CHOLANGIOGRAM;  Surgeon: Johnathan Hausen, MD;  Location: [facility];  Service: General;  Laterality: N/A;   left wrist  surgery      has plate in arm    NASAL POLYP SURGERY  2017   VENTRAL HERNIA REPAIR N/A 07/15/2014   Procedure: LAPAROSCOPIC REPAIR INCARCARTED UMBILICAL  VENTRAL HERNIA, ;  Surgeon: Fanny Skates, MD;  Location: [facility];  Service: General;  Laterality: N/A;  With MESH    I have reviewed the social history and family history with the patient and they are unchanged from previous  note.  ALLERGIES:  has No Known Allergies.  MEDICATIONS:  Current Outpatient Medications  Medication Sig Dispense Refill   lidocaine-prilocaine (EMLA) cream Apply to affected area once 30 g 3   ondansetron (ZOFRAN) 8 MG tablet Take 1 tablet (8 mg total) by mouth 2 (two) times daily as needed. Start on day 3 after chemotherapy. 30 tablet 1   prochlorperazine (COMPAZINE) 10 MG tablet Take 1 tablet (10 mg total) by mouth every 6 (six) hours as needed (Nausea or vomiting). 30 tablet 1   No current facility-administered medications for this visit.   Facility-Administered Medications Ordered in Other Visits  Medication Dose Route Frequency Provider Last Rate Last Admin   atropine injection 0.5 mg  0.5 mg Intravenous Once PRN Truitt Merle, MD       fluorouracil (ADRUCIL) 5,600 mg in sodium chloride 0.9 % 138 mL chemo infusion  2,400 mg/m2 (Treatment Plan Recorded) Intravenous 1 day or 1 dose Truitt Merle, MD       irinotecan (CAMPTOSAR) 360 mg in sodium chloride 0.9 % 500 mL chemo infusion  150 mg/m2 (Treatment Plan Recorded) Intravenous Once Truitt Merle, MD       leucovorin 936 mg in sodium chloride 0.9 % 250 mL infusion  400 mg/m2 (Treatment Plan Recorded) Intravenous Once Truitt Merle, MD       oxaliplatin (ELOXATIN) 200 mg in dextrose 5 % 500 mL chemo infusion  85 mg/m2 (Treatment Plan Recorded) Intravenous Once Truitt Merle, MD        PHYSICAL EXAMINATION: ECOG PERFORMANCE STATUS: 0 - Asymptomatic  There were no vitals filed for this visit. Wt Readings from Last 3 Encounters:  03/13/22 244 lb 8 oz (110.9 kg)  03/10/22 240 lb (108.9 kg)  02/28/22 238 lb (108 kg)     GENERAL:alert, no distress and comfortable SKIN: skin color normal, no rashes or significant lesions EYES: normal, Conjunctiva are pink and non-injected, sclera clear  NEURO: alert & oriented x 3 with fluent speech  LABORATORY DATA:  I have reviewed the data as listed    Latest Ref Rng & Units 03/08/2022   12:06 PM 02/28/2022     7:42 AM 07/12/2018    4:48 AM  CBC  WBC 4.0 - 10.5 K/uL 6.9  8.5  12.6   Hemoglobin 13.0 - 17.0 g/dL 14.8  15.3  13.8   Hematocrit 39.0 - 52.0 % 42.1  43.6  43.0   Platelets 150 - 400 K/uL 275  285  281         Latest Ref Rng & Units 03/08/2022   12:06 PM 07/12/2018    4:48 AM 07/11/2018    3:29 PM  CMP  Glucose 70 - 99 mg/dL 108   102   BUN 6 - 20 mg/dL 12   15   Creatinine 0.61 - 1.24 mg/dL 0.85  0.86  0.81   Sodium 135 - 145 mmol/L 140   139   Potassium 3.5 - 5.1 mmol/L 4.2   3.7   Chloride 98 - 111 mmol/L 106   105  CO2 22 - 32 mmol/L 29   26   Calcium 8.9 - 10.3 mg/dL 9.7   8.9   Total Protein 6.5 - 8.1 g/dL 7.2   7.2   Total Bilirubin 0.3 - 1.2 mg/dL 0.6   0.7   Alkaline Phos 38 - 126 U/L 48   58   AST 15 - 41 U/L 17   21   ALT 0 - 44 U/L 17   18       RADIOGRAPHIC STUDIES: I have personally reviewed the radiological images as listed and agreed with the findings in the report. No results found.    No orders of the defined types were placed in this encounter.  All questions were answered. The patient knows to call the clinic with any problems, questions or concerns. No barriers to learning was detected. The total time spent in the appointment was 30 minutes.     Truitt Merle, MD 03/13/2022   I, Wilburn Mylar, am acting as scribe for Truitt Merle, MD.   I have reviewed the above documentation for accuracy and completeness, and I agree with the above.

## 2022-03-13 NOTE — Research (Signed)
Trial:  [id number]: A RANDOMIZED TRIAL ADDRESSING CANCER-RELATED FINANCIAL HARDSHIP THROUGH DELIVERY OF A PROACTIVE FINANCIAL NAVIGATION INTERVENTION (CREDIT)  Patient Steve Mills was identified by Dr. Burr Medico as a potential candidate for the above listed study.  This Clinical Research Nurse met with Steve Mills, [id number], on 03/13/22 in a manner and location that ensures patient privacy to discuss participation in the above listed research study.  Patient is Accompanied by his wife, Steve Mills .  A copy of the informed consent document and separate HIPAA Authorization was provided to the patient.  Patient reads, speaks, and understands [place].   Patient was provided with the business card of this Nurse and encouraged to contact the research team with any questions.  Approximately 10 minutes were spent with the patient reviewing the informed consent documents.  Patient was provided the option of taking informed consent documents home to review and was encouraged to review at their convenience with their support network, including other care providers. Patient took the consent documents home to review. Patient agreed for this research nurse to follow up with him when he comes in for his next appointment on Wednesday 03/15/22.  Foye Spurling, BSN, RN, [employer] Research Nurse II 03/13/2022 11:03 AM

## 2022-03-14 ENCOUNTER — Ambulatory Visit (HOSPITAL_COMMUNITY): Payer: BC Managed Care – PPO

## 2022-03-14 ENCOUNTER — Telehealth: Payer: Self-pay | Admitting: *Deleted

## 2022-03-14 NOTE — Telephone Encounter (Signed)
-----   Message from Dionne Ano, RN sent at 03/13/2022  3:21 PM EDT ----- Regarding: Follow up: 1st time [facility] Dr Burr Medico 7/25

## 2022-03-15 ENCOUNTER — Other Ambulatory Visit: Payer: Self-pay

## 2022-03-15 ENCOUNTER — Inpatient Hospital Stay: Payer: BC Managed Care – PPO

## 2022-03-15 ENCOUNTER — Encounter: Payer: Self-pay | Admitting: *Deleted

## 2022-03-15 VITALS — BP 110/82 | HR 50 | Resp 16

## 2022-03-15 DIAGNOSIS — C787 Secondary malignant neoplasm of liver and intrahepatic bile duct: Secondary | ICD-10-CM | POA: Diagnosis not present

## 2022-03-15 DIAGNOSIS — C259 Malignant neoplasm of pancreas, unspecified: Secondary | ICD-10-CM

## 2022-03-15 DIAGNOSIS — Z452 Encounter for adjustment and management of vascular access device: Secondary | ICD-10-CM | POA: Diagnosis not present

## 2022-03-15 DIAGNOSIS — C250 Malignant neoplasm of head of pancreas: Secondary | ICD-10-CM | POA: Diagnosis not present

## 2022-03-15 DIAGNOSIS — Z5111 Encounter for antineoplastic chemotherapy: Secondary | ICD-10-CM | POA: Diagnosis not present

## 2022-03-15 DIAGNOSIS — R160 Hepatomegaly, not elsewhere classified: Secondary | ICD-10-CM | POA: Diagnosis not present

## 2022-03-15 MED ORDER — HEPARIN SOD (PORK) LOCK FLUSH 100 UNIT/ML IV SOLN
500.0000 [IU] | Freq: Once | INTRAVENOUS | Status: AC | PRN
  Administered 2022-03-15: 500 [IU]

## 2022-03-15 MED ORDER — SODIUM CHLORIDE 0.9% FLUSH
10.0000 mL | INTRAVENOUS | Status: DC | PRN
  Administered 2022-03-15: 10 mL

## 2022-03-15 NOTE — Research (Signed)
[id number]: A RANDOMIZED TRIAL ADDRESSING CANCER-RELATED FINANCIAL HARDSHIP THROUGH DELIVERY OF A PROACTIVE FINANCIAL NAVIGATION INTERVENTION (CREDIT)  Met with patient and his wife in private room to follow up on the study. Reviewed the purpose, potential benefits and risks of study. Discussed the study assessments and time points on study. Explained the 2 randomized arms of the study.  Also reminded patient and wife of the voluntary nature of the study. Patient declined participation stating he is very busy and does not have the extra time to participate. Thanked patient and his wife for their time and willingness to consider the study.   DCP-001: Use of a Clinical Trial Screening Tool to Address Cancer Health Disparities in the [facility])   Introduced the above study with brief explanation of purpose and data collection required for study.  Patient declined to participate. He states has a lot to do at this time and does not have time to do anything extra.  Thanked patient for his time today.    Dr. Burr Medico notified patient declined the [id number] study.   Foye Spurling, BSN, RN, [place] Nurse II 03/15/2022 1:53 PM

## 2022-03-16 ENCOUNTER — Encounter (HOSPITAL_COMMUNITY): Payer: Self-pay

## 2022-03-21 ENCOUNTER — Telehealth: Payer: Self-pay | Admitting: Hematology

## 2022-03-21 NOTE — Telephone Encounter (Signed)
Scheduled per 7/31 in basket, pt has been called and confirmed  

## 2022-03-22 ENCOUNTER — Other Ambulatory Visit: Payer: Self-pay

## 2022-03-27 ENCOUNTER — Telehealth: Payer: Self-pay | Admitting: Genetic Counselor

## 2022-03-27 ENCOUNTER — Encounter: Payer: Self-pay | Admitting: Genetic Counselor

## 2022-03-27 ENCOUNTER — Ambulatory Visit: Payer: Self-pay | Admitting: Genetic Counselor

## 2022-03-27 DIAGNOSIS — Z1379 Encounter for other screening for genetic and chromosomal anomalies: Secondary | ICD-10-CM

## 2022-03-27 NOTE — Progress Notes (Unsigned)
HPI:  Steve Mills was previously seen in the [facility] due to a personal and family history of cancer and concerns regarding a hereditary predisposition to cancer. Please refer to our prior cancer genetics clinic note for more information regarding our discussion, assessment and recommendations, at the time. Steve Mills recent genetic test results were disclosed to him, as were recommendations warranted by these results. These results and recommendations are discussed in more detail below.  CANCER HISTORY:  Oncology History  Pancreatic cancer metastasized to liver [facility])  02/28/2022 Cancer Staging   Staging form: Exocrine Pancreas, AJCC 8th Edition - Clinical stage from 02/28/2022: Stage IV (cT2, cN1, pM1) - Signed by Truitt Merle, MD on 03/02/2022 Stage prefix: Initial diagnosis   03/02/2022 Initial Diagnosis   Pancreatic cancer metastasized to liver ([place])   03/13/2022 -  Chemotherapy   Patient is on Treatment Plan : PANCREAS Modified FOLFIRINOX q14d x 4 cycles     03/18/2022 Genetic Testing   Negative genetic testing on the CancerNext-Expanded+RNAinsight panel.  APC c.4847A>T VUs identified.  The report date is March 18, 2022.  The CancerNext-Expanded gene panel offered by [facility] and includes sequencing and rearrangement analysis for the following 77 genes: AIP, ALK, APC*, ATM*, AXIN2, BAP1, BARD1, BLM, BMPR1A, BRCA1*, BRCA2*, BRIP1*, CDC73, CDH1*, CDK4, CDKN1B, CDKN2A, CHEK2*, CTNNA1, DICER1, FANCC, FH, FLCN, GALNT12, KIF1B, LZTR1, MAX, MEN1, MET, MLH1*, MSH2*, MSH3, MSH6*, MUTYH*, NBN, NF1*, NF2, NTHL1, PALB2*, PHOX2B, PMS2*, POT1, PRKAR1A, PTCH1, PTEN*, RAD51C*, RAD51D*, RB1, RECQL, RET, SDHA, SDHAF2, SDHB, SDHC, SDHD, SMAD4, SMARCA4, SMARCB1, SMARCE1, STK11, SUFU, TMEM127, TP53*, TSC1, TSC2, VHL and XRCC2 (sequencing and deletion/duplication); EGFR, EGLN1, HOXB13, KIT, MITF, PDGFRA, POLD1, and POLE (sequencing only); EPCAM and GREM1 (deletion/duplication only). DNA  and RNA analyses performed for * genes.      FAMILY HISTORY:  We obtained a detailed, 4-generation family history.  Significant diagnoses are listed below: Family History  Problem Relation Age of Onset   Lung cancer Mother 59   Breast cancer Mother        dx in her 36s or possibly 60s   Lung cancer Father 21   Breast cancer Maternal Aunt    Brain cancer Maternal Uncle    Lung cancer Paternal Aunt    Liver cancer Paternal Uncle    Colon cancer Maternal Grandfather    Cervical cancer Niece 33       cervical v uterine       The patient has two daughters who are cancer free.  He has a maternal half sister who is cancer free.  His parents are deceased.   His mother had breast cancer possibly in her 98's but definitely in her 26's.  She died of lung cancer.  She had 12 siblings.  One sister had breast cancer and a brother had brain cancer.  Several siblings had lung cancer and were smokers.  The maternal grandfather had colon cancer.   The patient's father died of lung cancer.  He had three brothers and a sister who died of lung and/or liver cancer.   Steve Mills is unaware of previous family history of genetic testing for hereditary cancer risks. Patient's maternal ancestors are of [place] descent, and paternal ancestors are of [place] descent. There is no reported Ashkenazi Jewish ancestry. There is no known consanguinity.  GENETIC TEST RESULTS: Genetic testing reported out on March 18, 2022 through the CancerNext-Expanded+RNAinsight cancer panel found no pathogenic mutations. The CancerNext-Expanded gene panel offered by [facility] and includes  sequencing and rearrangement analysis for the following 77 genes: AIP, ALK, APC*, ATM*, AXIN2, BAP1, BARD1, BLM, BMPR1A, BRCA1*, BRCA2*, BRIP1*, CDC73, CDH1*, CDK4, CDKN1B, CDKN2A, CHEK2*, CTNNA1, DICER1, FANCC, FH, FLCN, GALNT12, KIF1B, LZTR1, MAX, MEN1, MET, MLH1*, MSH2*, MSH3, MSH6*, MUTYH*, NBN, NF1*, NF2, NTHL1, PALB2*, PHOX2B, PMS2*, POT1,  PRKAR1A, PTCH1, PTEN*, RAD51C*, RAD51D*, RB1, RECQL, RET, SDHA, SDHAF2, SDHB, SDHC, SDHD, SMAD4, SMARCA4, SMARCB1, SMARCE1, STK11, SUFU, TMEM127, TP53*, TSC1, TSC2, VHL and XRCC2 (sequencing and deletion/duplication); EGFR, EGLN1, HOXB13, KIT, MITF, PDGFRA, POLD1, and POLE (sequencing only); EPCAM and GREM1 (deletion/duplication only). DNA and RNA analyses performed for * genes. The test report has been scanned into EPIC and is located under the Molecular Pathology section of the Results Review tab.  A portion of the result report is included below for reference.   ***  We discussed with Steve Mills that because current genetic testing is not perfect, it is possible there may be a gene mutation in one of these genes that current testing cannot detect, but that chance is small.  We also discussed, that there could be another gene that has not yet been discovered, or that we have not yet tested, that is responsible for the cancer diagnoses in the family. It is also possible there is a hereditary cause for the cancer in the family that Steve Mills did not inherit and therefore was not identified in his testing.  Therefore, it is important to remain in touch with cancer genetics in the future so that we can continue to offer Steve Mills the most up to date genetic testing.   Genetic testing did identify a variant of uncertain significance (VUS) was identified in the APC gene called c.4847A>T.  At this time, it is unknown if this variant is associated with increased cancer risk or if this is a normal finding, but most variants such as this get reclassified to being inconsequential. It should not be used to make medical management decisions. With time, we suspect the lab will determine the significance of this variant, if any. If we do learn more about it, we will try to contact Steve Mills to discuss it further. However, it is important to stay in touch with Steve Mills periodically and keep the address and phone number up  to date.  ADDITIONAL GENETIC TESTING: We discussed with Steve Mills that his genetic testing was fairly extensive.  If there are genes identified to increase cancer risk that can be analyzed in the future, we would be happy to discuss and coordinate this testing at that time.    CANCER SCREENING RECOMMENDATIONS: Steve Mills test result is considered negative (normal).  This means that we have not identified a hereditary cause for his personal and family history of cancer at this time. Most cancers happen by chance and this negative test suggests that his cancer may fall into this category.    While reassuring, this does not definitively rule out a hereditary predisposition to cancer. It is still possible that there could be genetic mutations that are undetectable by current technology. There could be genetic mutations in genes that have not been tested or identified to increase cancer risk.  Therefore, it is recommended he continue to follow the cancer management and screening guidelines provided by his oncology and primary healthcare provider.   An individual's cancer risk and medical management are not determined by genetic test results alone. Overall cancer risk assessment incorporates additional factors, including personal medical history, family history, and any available genetic information that  may result in a personalized plan for cancer prevention and surveillance  RECOMMENDATIONS FOR FAMILY MEMBERS:  Individuals in this family might be at some increased risk of developing cancer, over the general population risk, simply due to the family history of cancer.  We recommended women in this family have a yearly mammogram beginning at age 66, or 35 years younger than the earliest onset of cancer, an annual clinical breast exam, and perform monthly breast self-exams. Women in this family should also have a gynecological exam as recommended by their primary provider. All family members should be  referred for colonoscopy starting at age 58.  FOLLOW-UP: Lastly, we discussed with Steve Mills that cancer genetics is a rapidly advancing field and it is possible that new genetic tests will be appropriate for him and/or his family members in the future. We encouraged him to remain in contact with cancer genetics on an annual basis so we can update his personal and family histories and let him know of advances in cancer genetics that may benefit this family.   Our contact number was provided. Steve Mills questions were answered to his satisfaction, and he knows he is welcome to call Steve Mills at anytime with additional questions or concerns.   Roma Kayser, [place], [facility] Licensed, Certified Genetic Counselor [email or web address]

## 2022-03-27 NOTE — Telephone Encounter (Addendum)
Revealed negative genetic testing.  Discussed that we do not know why he has pancreatic cancer or why there is cancer in the family. It could be due to a different gene that we are not testing, or maybe our current technology may not be able to pick something up.  It will be important for him to keep in contact with genetics to keep up with whether additional testing may be needed.

## 2022-03-28 ENCOUNTER — Encounter: Payer: Self-pay | Admitting: Hematology

## 2022-03-28 MED FILL — Dexamethasone Sodium Phosphate Inj 100 MG/10ML: INTRAMUSCULAR | Qty: 1 | Status: AC

## 2022-03-28 MED FILL — Fosaprepitant Dimeglumine For IV Infusion 150 MG (Base Eq): INTRAVENOUS | Qty: 5 | Status: AC

## 2022-03-29 ENCOUNTER — Inpatient Hospital Stay: Payer: BC Managed Care – PPO | Attending: Hematology

## 2022-03-29 ENCOUNTER — Inpatient Hospital Stay: Payer: BC Managed Care – PPO | Admitting: Hematology

## 2022-03-29 ENCOUNTER — Encounter: Payer: Self-pay | Admitting: Hematology

## 2022-03-29 ENCOUNTER — Other Ambulatory Visit: Payer: Self-pay

## 2022-03-29 ENCOUNTER — Inpatient Hospital Stay: Payer: BC Managed Care – PPO

## 2022-03-29 VITALS — BP 123/80 | HR 72 | Temp 98.7°F | Wt 246.2 lb

## 2022-03-29 DIAGNOSIS — C259 Malignant neoplasm of pancreas, unspecified: Secondary | ICD-10-CM

## 2022-03-29 DIAGNOSIS — Z452 Encounter for adjustment and management of vascular access device: Secondary | ICD-10-CM | POA: Diagnosis not present

## 2022-03-29 DIAGNOSIS — Z5111 Encounter for antineoplastic chemotherapy: Secondary | ICD-10-CM | POA: Insufficient documentation

## 2022-03-29 DIAGNOSIS — C250 Malignant neoplasm of head of pancreas: Secondary | ICD-10-CM | POA: Diagnosis not present

## 2022-03-29 DIAGNOSIS — C787 Secondary malignant neoplasm of liver and intrahepatic bile duct: Secondary | ICD-10-CM | POA: Insufficient documentation

## 2022-03-29 DIAGNOSIS — D649 Anemia, unspecified: Secondary | ICD-10-CM | POA: Insufficient documentation

## 2022-03-29 DIAGNOSIS — Z95828 Presence of other vascular implants and grafts: Secondary | ICD-10-CM

## 2022-03-29 LAB — CBC WITH DIFFERENTIAL (CANCER CENTER ONLY)
Abs Immature Granulocytes: 0.02 10*3/uL (ref 0.00–0.07)
Basophils Absolute: 0.1 10*3/uL (ref 0.0–0.1)
Basophils Relative: 1 %
Eosinophils Absolute: 1.1 10*3/uL — ABNORMAL HIGH (ref 0.0–0.5)
Eosinophils Relative: 16 %
HCT: 36.9 % — ABNORMAL LOW (ref 39.0–52.0)
Hemoglobin: 13.2 g/dL (ref 13.0–17.0)
Immature Granulocytes: 0 %
Lymphocytes Relative: 46 %
Lymphs Abs: 2.9 10*3/uL (ref 0.7–4.0)
MCH: 30.7 pg (ref 26.0–34.0)
MCHC: 35.8 g/dL (ref 30.0–36.0)
MCV: 85.8 fL (ref 80.0–100.0)
Monocytes Absolute: 0.6 10*3/uL (ref 0.1–1.0)
Monocytes Relative: 9 %
Neutro Abs: 1.9 10*3/uL (ref 1.7–7.7)
Neutrophils Relative %: 28 %
Platelet Count: 196 10*3/uL (ref 150–400)
RBC: 4.3 MIL/uL (ref 4.22–5.81)
RDW: 13.3 % (ref 11.5–15.5)
WBC Count: 6.5 10*3/uL (ref 4.0–10.5)
nRBC: 0 % (ref 0.0–0.2)

## 2022-03-29 LAB — CMP (CANCER CENTER ONLY)
ALT: 30 U/L (ref 0–44)
AST: 18 U/L (ref 15–41)
Albumin: 4.2 g/dL (ref 3.5–5.0)
Alkaline Phosphatase: 69 U/L (ref 38–126)
Anion gap: 6 (ref 5–15)
BUN: 11 mg/dL (ref 6–20)
CO2: 28 mmol/L (ref 22–32)
Calcium: 8.9 mg/dL (ref 8.9–10.3)
Chloride: 105 mmol/L (ref 98–111)
Creatinine: 0.77 mg/dL (ref 0.61–1.24)
GFR, Estimated: >60 mL/min (ref >60)
Glucose, Bld: 132 mg/dL — ABNORMAL HIGH (ref 70–99)
Potassium: 3.8 mmol/L (ref 3.5–5.1)
Sodium: 139 mmol/L (ref 135–145)
Total Bilirubin: 0.5 mg/dL (ref 0.3–1.2)
Total Protein: 6.8 g/dL (ref 6.5–8.1)

## 2022-03-29 MED ORDER — SODIUM CHLORIDE 0.9% FLUSH
10.0000 mL | INTRAVENOUS | Status: AC | PRN
  Administered 2022-03-29: 10 mL

## 2022-03-29 MED ORDER — PALONOSETRON HCL INJECTION 0.25 MG/5ML
INTRAVENOUS | Status: AC
  Filled 2022-03-29: qty 5

## 2022-03-29 MED ORDER — SODIUM CHLORIDE 0.9 % IV SOLN
150.0000 mg/m2 | Freq: Once | INTRAVENOUS | Status: AC
  Administered 2022-03-29: 360 mg via INTRAVENOUS
  Filled 2022-03-29: qty 5

## 2022-03-29 MED ORDER — SODIUM CHLORIDE 0.9 % IV SOLN
400.0000 mg/m2 | Freq: Once | INTRAVENOUS | Status: AC
  Administered 2022-03-29: 936 mg via INTRAVENOUS
  Filled 2022-03-29: qty 46.8

## 2022-03-29 MED ORDER — ATROPINE SULFATE 1 MG/ML IV SOLN
INTRAVENOUS | Status: AC
  Filled 2022-03-29: qty 1

## 2022-03-29 MED ORDER — SODIUM CHLORIDE 0.9% FLUSH
10.0000 mL | INTRAVENOUS | Status: DC | PRN
  Administered 2022-03-29: 10 mL

## 2022-03-29 MED ORDER — OXALIPLATIN CHEMO INJECTION 100 MG/20ML
85.0000 mg/m2 | Freq: Once | INTRAVENOUS | Status: AC
  Administered 2022-03-29: 200 mg via INTRAVENOUS
  Filled 2022-03-29: qty 40

## 2022-03-29 MED ORDER — SODIUM CHLORIDE 0.9 % IV SOLN
2400.0000 mg/m2 | INTRAVENOUS | Status: DC
  Administered 2022-03-29: 5600 mg via INTRAVENOUS
  Filled 2022-03-29: qty 112

## 2022-03-29 MED ORDER — ATROPINE SULFATE 1 MG/ML IV SOLN: 0.5000 mg | Freq: Once | INTRAVENOUS | Status: DC | PRN

## 2022-03-29 MED ORDER — HEPARIN SOD (PORK) LOCK FLUSH 100 UNIT/ML IV SOLN: 500.0000 [IU] | Freq: Once | INTRAVENOUS | Status: DC | PRN

## 2022-03-29 MED ORDER — SODIUM CHLORIDE 0.9 % IV SOLN
150.0000 mg | Freq: Once | INTRAVENOUS | Status: AC
  Administered 2022-03-29: 150 mg via INTRAVENOUS
  Filled 2022-03-29: qty 150

## 2022-03-29 MED ORDER — DEXTROSE 5 % IV SOLN: Freq: Once | INTRAVENOUS | Status: AC

## 2022-03-29 MED ORDER — PALONOSETRON HCL INJECTION 0.25 MG/5ML
0.2500 mg | Freq: Once | INTRAVENOUS | Status: AC
  Administered 2022-03-29: 0.25 mg via INTRAVENOUS

## 2022-03-29 MED ORDER — SODIUM CHLORIDE 0.9 % IV SOLN
10.0000 mg | Freq: Once | INTRAVENOUS | Status: AC
  Administered 2022-03-29: 10 mg via INTRAVENOUS
  Filled 2022-03-29: qty 10

## 2022-03-29 NOTE — Progress Notes (Signed)
[place]   [phone] Fax:(336) [phone]   Clinic Follow up Note   Patient Care Team: [name], MD as PCP - General (Family Medicine) Truitt Merle, MD as Consulting Physician (Oncology) Royston Bake, RN as Oncology Nurse Navigator (Oncology)  Date of Service:  03/29/2022  CHIEF COMPLAINT: f/u of metastatic pancreatic cancer  CURRENT THERAPY:  FOLFIRINOX, q14d, starting 03/13/22  ASSESSMENT & PLAN:  Steve Mills is a 56 y.o. male with   1. Pancreatic Head Adenocarcinoma, metastatic to liver, stage IV, MMR normal  -incidental finding on lung cancer screening chest CT on 01/19/22. 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  -liver biopsy on 02/28/22 confirmed poorly differentiated adenocarcinoma, compatible with clinical suspicion of pancreatic adenocarcinoma. MMR normal, not a candidate for immunotherapy.  -FO showed KRAS mutation, and MSS, no immunotherapy or targeted therapy available.  I reviewed with him today. -He is genetic testing was negative, not a candidate for PARP inhibitor -he began FOLFIRINOX on 03/13/22. He tolerated very well with mild nausea and some brain fog. -labs reviewed, overall stable. Adequate to proceed with cycle 2. -Plan to repeat staging CT scan after cycle 6   2. Genetics -he has a strong family history of cancer, specifically liver and lung on the paternal side and breast and colon on the maternal side. -testing performed 03/08/22, results are negative.   3. Goal of care discussion  -We previously discussed the incurable nature of his cancer, and the overall poor prognosis, especially if he does not have good response to chemotherapy or progress on chemo -The patient understands the goal of care is palliative. -he is full code now      PLAN:  -proceed with C2FOLFIRINOX today at same full dose with GCSF  on day 3  -lab, flush, f/u, and FOLFIRINOX in 2 and 4 weeks as scheduled   No problem-specific Assessment & Plan notes found for this encounter.   SUMMARY OF ONCOLOGIC HISTORY: Oncology History  Pancreatic cancer metastasized to liver [facility])  02/28/2022 Cancer Staging   Staging form: Exocrine Pancreas, AJCC 8th Edition - Clinical stage from 02/28/2022: Stage IV (cT2, cN1, pM1) - Signed by Truitt Merle, MD on 03/02/2022 Stage prefix: Initial diagnosis   03/02/2022 Initial Diagnosis   Pancreatic cancer metastasized to liver ([place])   03/13/2022 -  Chemotherapy   Patient is on Treatment Plan : PANCREAS Modified FOLFIRINOX q14d x 4 cycles     03/18/2022 Genetic Testing   Negative genetic testing on the CancerNext-Expanded+RNAinsight panel.  APC c.4847A>T VUs identified.  The report date is March 18, 2022.  The CancerNext-Expanded gene panel offered by [facility] and includes sequencing and rearrangement analysis for the following 77 genes: AIP, ALK, APC*, ATM*, AXIN2, BAP1, BARD1, BLM, BMPR1A, BRCA1*, BRCA2*, BRIP1*, CDC73, CDH1*, CDK4, CDKN1B, CDKN2A, CHEK2*, CTNNA1, DICER1, FANCC, FH, FLCN, GALNT12, KIF1B, LZTR1, MAX, MEN1, MET, MLH1*, MSH2*, MSH3, MSH6*, MUTYH*, NBN, NF1*, NF2, NTHL1, PALB2*, PHOX2B, PMS2*, POT1, PRKAR1A, PTCH1, PTEN*, RAD51C*, RAD51D*, RB1, RECQL, RET, SDHA, SDHAF2, SDHB, SDHC, SDHD, SMAD4, SMARCA4, SMARCB1, SMARCE1, STK11, SUFU, TMEM127, TP53*, TSC1, TSC2, VHL and XRCC2 (sequencing and deletion/duplication); EGFR, EGLN1, HOXB13, KIT, MITF, PDGFRA, POLD1, and POLE (sequencing only); EPCAM and GREM1 (deletion/duplication only). DNA and RNA analyses performed for * genes.       INTERVAL HISTORY:  Steve Mills is here for a follow up of metastatic pancreatic cancer.  He was last seen by me on 03/13/22. He presents to the clinic accompanied by his wife. He reports he did well with first FOLFIRINOX. He reports he had some brain fog and nausea. He notes he only used compazine. He  denies bone pain.   All other systems were reviewed with the patient and are negative.  MEDICAL HISTORY:  Past Medical History:  Diagnosis Date   Family history of adverse reaction to anesthesia    mother allergic to ether    Family history of breast cancer    Family history of colon cancer    Pneumonia    hx of 2014     SURGICAL HISTORY: Past Surgical History:  Procedure Laterality Date   APPENDECTOMY     CHOLECYSTECTOMY N/A 07/12/2018   Procedure: LAPAROSCOPIC CHOLECYSTECTOMY WITH INTRAOPERATIVE CHOLANGIOGRAM;  Surgeon: Johnathan Hausen, MD;  Location: [facility];  Service: General;  Laterality: N/A;   left wrist surgery      has plate in arm    NASAL POLYP SURGERY  2017   PORTACATH PLACEMENT N/A 03/10/2022   Procedure: INSERTION PORT-A-CATH;  Surgeon: Dwan Bolt, MD;  Location: [facility];  Service: General;  Laterality: N/A;   VENTRAL HERNIA REPAIR N/A 07/15/2014   Procedure: LAPAROSCOPIC REPAIR INCARCARTED UMBILICAL  VENTRAL HERNIA, ;  Surgeon: Fanny Skates, MD;  Location: [facility];  Service: General;  Laterality: N/A;  With MESH    I have reviewed the social history and family history with the patient and they are unchanged from previous note.  ALLERGIES:  has No Known Allergies.  MEDICATIONS:  Current Outpatient Medications  Medication Sig Dispense Refill   lidocaine-prilocaine (EMLA) cream Apply to affected area once 30 g 3   ondansetron (ZOFRAN) 8 MG tablet Take 1 tablet (8 mg total) by mouth 2 (two) times daily as needed. Start on day 3 after chemotherapy. 30 tablet 1   prochlorperazine (COMPAZINE) 10 MG tablet Take 1 tablet (10 mg total) by mouth every 6 (six) hours as needed (Nausea or vomiting). 30 tablet 1   No current facility-administered medications for this visit.   Facility-Administered Medications Ordered in Other Visits  Medication Dose Route Frequency Provider Last Rate Last Admin   atropine 1 MG/ML injection            atropine injection 0.5 mg  0.5 mg  Intravenous Once PRN Truitt Merle, MD       fluorouracil (ADRUCIL) 5,600 mg in sodium chloride 0.9 % 138 mL chemo infusion  2,400 mg/m2 (Treatment Plan Recorded) Intravenous 1 day or 1 dose Truitt Merle, MD       heparin lock flush 100 unit/mL  500 Units Intracatheter Once PRN Truitt Merle, MD       irinotecan (CAMPTOSAR) 360 mg in sodium chloride 0.9 % 500 mL chemo infusion  150 mg/m2 (Treatment Plan Recorded) Intravenous Once Truitt Merle, MD       leucovorin 936 mg in sodium chloride 0.9 % 250 mL infusion  400 mg/m2 (Treatment Plan Recorded) Intravenous Once Truitt Merle, MD       oxaliplatin (ELOXATIN) 200 mg in dextrose 5 % 500 mL chemo infusion  85 mg/m2 (Treatment Plan Recorded) Intravenous Once Truitt Merle, MD       palonosetron (ALOXI) 0.25 MG/5ML injection            sodium chloride flush (NS) 0.9 % injection 10 mL  10 mL Intracatheter PRN Truitt Merle, MD        PHYSICAL EXAMINATION: ECOG PERFORMANCE  STATUS: 0 - Asymptomatic  Vitals:   03/29/22 0902  BP: 123/80  Pulse: 72  Temp: 98.7 F (37.1 C)  SpO2: 96%   Wt Readings from Last 3 Encounters:  03/29/22 246 lb 3.2 oz (111.7 kg)  03/13/22 244 lb 8 oz (110.9 kg)  03/10/22 240 lb (108.9 kg)     GENERAL:alert, no distress and comfortable SKIN: skin color normal, no rashes or significant lesions EYES: normal, Conjunctiva are pink and non-injected, sclera clear  NEURO: alert & oriented x 3 with fluent speech  LABORATORY DATA:  I have reviewed the data as listed    Latest Ref Rng & Units 03/29/2022    8:30 AM 03/08/2022   12:06 PM 02/28/2022    7:42 AM  CBC  WBC 4.0 - 10.5 K/uL 6.5  6.9  8.5   Hemoglobin 13.0 - 17.0 g/dL 13.2  14.8  15.3   Hematocrit 39.0 - 52.0 % 36.9  42.1  43.6   Platelets 150 - 400 K/uL 196  275  285         Latest Ref Rng & Units 03/29/2022    8:30 AM 03/08/2022   12:06 PM 07/12/2018    4:48 AM  CMP  Glucose 70 - 99 mg/dL 132  108    BUN 6 - 20 mg/dL 11  12    Creatinine 0.61 - 1.24 mg/dL 0.77  0.85  0.86    Sodium 135 - 145 mmol/L 139  140    Potassium 3.5 - 5.1 mmol/L 3.8  4.2    Chloride 98 - 111 mmol/L 105  106    CO2 22 - 32 mmol/L 28  29    Calcium 8.9 - 10.3 mg/dL 8.9  9.7    Total Protein 6.5 - 8.1 g/dL 6.8  7.2    Total Bilirubin 0.3 - 1.2 mg/dL 0.5  0.6    Alkaline Phos 38 - 126 U/L 69  48    AST 15 - 41 U/L 18  17    ALT 0 - 44 U/L 30  17        RADIOGRAPHIC STUDIES: I have personally reviewed the radiological images as listed and agreed with the findings in the report. No results found.    No orders of the defined types were placed in this encounter.  All questions were answered. The patient knows to call the clinic with any problems, questions or concerns. No barriers to learning was detected. The total time spent in the appointment was 30 minutes.     Truitt Merle, MD 03/29/2022   I, Wilburn Mylar, am acting as scribe for Truitt Merle, MD.   I have reviewed the above documentation for accuracy and completeness, and I agree with the above.

## 2022-03-29 NOTE — Patient Instructions (Signed)
[facility]  Discharge Instructions: Thank you for choosing [facility] to provide your oncology and hematology care.   If you have a lab appointment with the [facility], please go directly to the [facility] and check in at the registration area.   Wear comfortable clothing and clothing appropriate for easy access to any Portacath or PICC line.   We strive to give you quality time with your provider. You may need to reschedule your appointment if you arrive late (15 or more minutes).  Arriving late affects you and other patients whose appointments are after yours.  Also, if you miss three or more appointments without notifying the office, you may be dismissed from the clinic at the provider's discretion.      For prescription refill requests, have your pharmacy contact our office and allow 72 hours for refills to be completed.    Today you received the following chemotherapy and/or immunotherapy agents: oxaliplatin, leucovorin, irinotecan, fluorourcil      To help prevent nausea and vomiting after your treatment, we encourage you to take your nausea medication as directed.  BELOW ARE SYMPTOMS THAT SHOULD BE REPORTED IMMEDIATELY: *FEVER GREATER THAN 100.4 F (38 C) OR HIGHER *CHILLS OR SWEATING *NAUSEA AND VOMITING THAT IS NOT CONTROLLED WITH YOUR NAUSEA MEDICATION *UNUSUAL SHORTNESS OF BREATH *UNUSUAL BRUISING OR BLEEDING *URINARY PROBLEMS (pain or burning when urinating, or frequent urination) *BOWEL PROBLEMS (unusual diarrhea, constipation, pain near the anus) TENDERNESS IN MOUTH AND THROAT WITH OR WITHOUT PRESENCE OF ULCERS (sore throat, sores in mouth, or a toothache) UNUSUAL RASH, SWELLING OR PAIN  UNUSUAL VAGINAL DISCHARGE OR ITCHING   Items with * indicate a potential emergency and should be followed up as soon as possible or go to the Emergency Department if any problems should occur.  Please show the CHEMOTHERAPY ALERT CARD or  IMMUNOTHERAPY ALERT CARD at check-in to the Emergency Department and triage nurse.  Should you have questions after your visit or need to cancel or reschedule your appointment, please contact [facility]  Dept: [phone]  and follow the prompts.  Office hours are 8:00 a.m. to 4:30 p.m. Monday - Friday. Please note that voicemails left after 4:00 p.m. may not be returned until the following business day.  We are closed weekends and major holidays. You have access to a nurse at all times for urgent questions. Please call the main number to the clinic Dept: [phone] and follow the prompts.   For any non-urgent questions, you may also contact your provider using MyChart. We now offer e-Visits for anyone 51 and older to request care online for non-urgent symptoms. For details visit [email or web address].   Also download the MyChart app! Go to the app store, search "MyChart", open the app, select [facility], and log in with your MyChart username and password.  Masks are optional in the cancer centers. If you would like for your care team to wear a mask while they are taking care of you, please let them know. You may have one support person who is at least 57 years old accompany you for your appointments.

## 2022-03-30 LAB — CANCER ANTIGEN 19-9: CA 19-9: 117 U/mL — ABNORMAL HIGH (ref 0–35)

## 2022-03-31 ENCOUNTER — Inpatient Hospital Stay: Payer: BC Managed Care – PPO

## 2022-03-31 ENCOUNTER — Other Ambulatory Visit: Payer: Self-pay

## 2022-03-31 VITALS — BP 132/91 | HR 56 | Temp 98.1°F | Resp 16

## 2022-03-31 DIAGNOSIS — C787 Secondary malignant neoplasm of liver and intrahepatic bile duct: Secondary | ICD-10-CM | POA: Diagnosis not present

## 2022-03-31 DIAGNOSIS — Z5111 Encounter for antineoplastic chemotherapy: Secondary | ICD-10-CM | POA: Diagnosis not present

## 2022-03-31 DIAGNOSIS — C250 Malignant neoplasm of head of pancreas: Secondary | ICD-10-CM | POA: Diagnosis not present

## 2022-03-31 DIAGNOSIS — C259 Malignant neoplasm of pancreas, unspecified: Secondary | ICD-10-CM

## 2022-03-31 DIAGNOSIS — Z452 Encounter for adjustment and management of vascular access device: Secondary | ICD-10-CM | POA: Diagnosis not present

## 2022-03-31 DIAGNOSIS — D649 Anemia, unspecified: Secondary | ICD-10-CM | POA: Diagnosis not present

## 2022-03-31 MED ORDER — SODIUM CHLORIDE 0.9% FLUSH
10.0000 mL | INTRAVENOUS | Status: DC | PRN
  Administered 2022-03-31: 10 mL

## 2022-03-31 MED ORDER — HEPARIN SOD (PORK) LOCK FLUSH 100 UNIT/ML IV SOLN
500.0000 [IU] | Freq: Once | INTRAVENOUS | Status: AC | PRN
  Administered 2022-03-31: 500 [IU]

## 2022-04-11 ENCOUNTER — Inpatient Hospital Stay: Payer: BC Managed Care – PPO

## 2022-04-11 ENCOUNTER — Other Ambulatory Visit: Payer: Self-pay

## 2022-04-11 ENCOUNTER — Inpatient Hospital Stay (HOSPITAL_BASED_OUTPATIENT_CLINIC_OR_DEPARTMENT_OTHER): Payer: BC Managed Care – PPO | Admitting: Physician Assistant

## 2022-04-11 VITALS — BP 119/88 | HR 73 | Temp 97.6°F | Resp 17 | Ht 72.0 in | Wt 244.8 lb

## 2022-04-11 DIAGNOSIS — C787 Secondary malignant neoplasm of liver and intrahepatic bile duct: Secondary | ICD-10-CM | POA: Diagnosis not present

## 2022-04-11 DIAGNOSIS — C259 Malignant neoplasm of pancreas, unspecified: Secondary | ICD-10-CM

## 2022-04-11 DIAGNOSIS — Z5111 Encounter for antineoplastic chemotherapy: Secondary | ICD-10-CM | POA: Diagnosis not present

## 2022-04-11 DIAGNOSIS — D649 Anemia, unspecified: Secondary | ICD-10-CM | POA: Diagnosis not present

## 2022-04-11 DIAGNOSIS — Z95828 Presence of other vascular implants and grafts: Secondary | ICD-10-CM | POA: Insufficient documentation

## 2022-04-11 DIAGNOSIS — C250 Malignant neoplasm of head of pancreas: Secondary | ICD-10-CM | POA: Diagnosis not present

## 2022-04-11 DIAGNOSIS — Z452 Encounter for adjustment and management of vascular access device: Secondary | ICD-10-CM | POA: Diagnosis not present

## 2022-04-11 LAB — CBC WITH DIFFERENTIAL (CANCER CENTER ONLY)
Abs Immature Granulocytes: 0.01 10*3/uL (ref 0.00–0.07)
Basophils Absolute: 0 10*3/uL (ref 0.0–0.1)
Basophils Relative: 1 %
Eosinophils Absolute: 0.6 10*3/uL — ABNORMAL HIGH (ref 0.0–0.5)
Eosinophils Relative: 9 %
HCT: 34.6 % — ABNORMAL LOW (ref 39.0–52.0)
Hemoglobin: 12.6 g/dL — ABNORMAL LOW (ref 13.0–17.0)
Immature Granulocytes: 0 %
Lymphocytes Relative: 38 %
Lymphs Abs: 2.6 10*3/uL (ref 0.7–4.0)
MCH: 30.9 pg (ref 26.0–34.0)
MCHC: 36.4 g/dL — ABNORMAL HIGH (ref 30.0–36.0)
MCV: 84.8 fL (ref 80.0–100.0)
Monocytes Absolute: 0.6 10*3/uL (ref 0.1–1.0)
Monocytes Relative: 9 %
Neutro Abs: 3 10*3/uL (ref 1.7–7.7)
Neutrophils Relative %: 43 %
Platelet Count: 227 10*3/uL (ref 150–400)
RBC: 4.08 MIL/uL — ABNORMAL LOW (ref 4.22–5.81)
RDW: 13.8 % (ref 11.5–15.5)
WBC Count: 6.8 10*3/uL (ref 4.0–10.5)
nRBC: 0 % (ref 0.0–0.2)

## 2022-04-11 LAB — CMP (CANCER CENTER ONLY)
ALT: 24 U/L (ref 0–44)
AST: 18 U/L (ref 15–41)
Albumin: 4.1 g/dL (ref 3.5–5.0)
Alkaline Phosphatase: 67 U/L (ref 38–126)
Anion gap: 6 (ref 5–15)
BUN: 8 mg/dL (ref 6–20)
CO2: 28 mmol/L (ref 22–32)
Calcium: 9.2 mg/dL (ref 8.9–10.3)
Chloride: 106 mmol/L (ref 98–111)
Creatinine: 0.71 mg/dL (ref 0.61–1.24)
GFR, Estimated: >60 mL/min (ref >60)
Glucose, Bld: 132 mg/dL — ABNORMAL HIGH (ref 70–99)
Potassium: 3.5 mmol/L (ref 3.5–5.1)
Sodium: 140 mmol/L (ref 135–145)
Total Bilirubin: 0.5 mg/dL (ref 0.3–1.2)
Total Protein: 6.3 g/dL — ABNORMAL LOW (ref 6.5–8.1)

## 2022-04-11 MED ORDER — SODIUM CHLORIDE 0.9 % IV SOLN
400.0000 mg/m2 | Freq: Once | INTRAVENOUS | Status: AC
  Administered 2022-04-11: 936 mg via INTRAVENOUS
  Filled 2022-04-11: qty 46.8

## 2022-04-11 MED ORDER — SODIUM CHLORIDE 0.9 % IV SOLN
2400.0000 mg/m2 | INTRAVENOUS | Status: DC
  Administered 2022-04-11: 5600 mg via INTRAVENOUS
  Filled 2022-04-11: qty 112

## 2022-04-11 MED ORDER — DEXTROSE 5 % IV SOLN: Freq: Once | INTRAVENOUS | Status: AC

## 2022-04-11 MED ORDER — PALONOSETRON HCL INJECTION 0.25 MG/5ML
0.2500 mg | Freq: Once | INTRAVENOUS | Status: AC
  Administered 2022-04-11: 0.25 mg via INTRAVENOUS
  Filled 2022-04-11: qty 5

## 2022-04-11 MED ORDER — ATROPINE SULFATE 1 MG/ML IV SOLN
0.5000 mg | Freq: Once | INTRAVENOUS | Status: AC | PRN
  Administered 2022-04-11: 0.5 mg via INTRAVENOUS
  Filled 2022-04-11: qty 1

## 2022-04-11 MED ORDER — SODIUM CHLORIDE 0.9 % IV SOLN
10.0000 mg | Freq: Once | INTRAVENOUS | Status: AC
  Administered 2022-04-11: 10 mg via INTRAVENOUS
  Filled 2022-04-11: qty 10

## 2022-04-11 MED ORDER — SODIUM CHLORIDE 0.9% FLUSH
10.0000 mL | Freq: Once | INTRAVENOUS | Status: AC
  Administered 2022-04-11: 10 mL

## 2022-04-11 MED ORDER — SODIUM CHLORIDE 0.9 % IV SOLN
150.0000 mg/m2 | Freq: Once | INTRAVENOUS | Status: AC
  Administered 2022-04-11: 360 mg via INTRAVENOUS
  Filled 2022-04-11: qty 15

## 2022-04-11 MED ORDER — OXALIPLATIN CHEMO INJECTION 100 MG/20ML
85.0000 mg/m2 | Freq: Once | INTRAVENOUS | Status: AC
  Administered 2022-04-11: 200 mg via INTRAVENOUS
  Filled 2022-04-11: qty 40

## 2022-04-11 MED ORDER — SODIUM CHLORIDE 0.9 % IV SOLN
150.0000 mg | Freq: Once | INTRAVENOUS | Status: AC
  Administered 2022-04-11: 150 mg via INTRAVENOUS
  Filled 2022-04-11: qty 150

## 2022-04-11 NOTE — Progress Notes (Signed)
[place]   Telephone:(336) [phone] Fax:(336) [phone]   Clinic Follow up Note   Patient Care Team: [name], MD as PCP - General (Family Medicine) Truitt Merle, MD as Consulting Physician (Oncology) Royston Bake, RN as Oncology Nurse Navigator (Oncology)  Date of Service:  04/11/2022  CHIEF COMPLAINT: f/u of metastatic pancreatic cancer  CURRENT THERAPY:  FOLFIRINOX, q14d, starting 03/13/22  SUMMARY OF ONCOLOGIC HISTORY: Oncology History  Pancreatic cancer metastasized to liver [facility])  02/28/2022 Cancer Staging   Staging form: Exocrine Pancreas, AJCC 8th Edition - Clinical stage from 02/28/2022: Stage IV (cT2, cN1, pM1) - Signed by Truitt Merle, MD on 03/02/2022 Stage prefix: Initial diagnosis   03/02/2022 Initial Diagnosis   Pancreatic cancer metastasized to liver ([place])   03/13/2022 -  Chemotherapy   Patient is on Treatment Plan : PANCREAS Modified FOLFIRINOX q14d x 4 cycles     03/18/2022 Genetic Testing   Negative genetic testing on the CancerNext-Expanded+RNAinsight panel.  APC c.4847A>T VUs identified.  The report date is March 18, 2022.  The CancerNext-Expanded gene panel offered by [facility] and includes sequencing and rearrangement analysis for the following 77 genes: AIP, ALK, APC*, ATM*, AXIN2, BAP1, BARD1, BLM, BMPR1A, BRCA1*, BRCA2*, BRIP1*, CDC73, CDH1*, CDK4, CDKN1B, CDKN2A, CHEK2*, CTNNA1, DICER1, FANCC, FH, FLCN, GALNT12, KIF1B, LZTR1, MAX, MEN1, MET, MLH1*, MSH2*, MSH3, MSH6*, MUTYH*, NBN, NF1*, NF2, NTHL1, PALB2*, PHOX2B, PMS2*, POT1, PRKAR1A, PTCH1, PTEN*, RAD51C*, RAD51D*, RB1, RECQL, RET, SDHA, SDHAF2, SDHB, SDHC, SDHD, SMAD4, SMARCA4, SMARCB1, SMARCE1, STK11, SUFU, TMEM127, TP53*, TSC1, TSC2, VHL and XRCC2 (sequencing and deletion/duplication); EGFR, EGLN1, HOXB13, KIT, MITF, PDGFRA, POLD1, and POLE (sequencing only); EPCAM and GREM1 (deletion/duplication only). DNA and RNA analyses performed for * genes.       INTERVAL HISTORY:  Steve Mills is here for a follow up of metastatic pancreatic cancer. He was last seen by Dr. Burr Medico on 03/29/22. He presents to the clinic accompanied by his wife. Steve Mills reports that he felt fatigue and brain fog for up to 5 days after his last treatment. After some resting, he resumed working and was able to complete his daily activities on his own. He experienced mild nausea without any vomiting episodes. He took compazine as needed with relief of nausea. He denies abdominal pain. He reports soft stools but denies any diarrhea. He denies easy bruising or signs of active bleeding. He denies fevers, chills, night sweats, shortness of breath, chest pain, cough, peripheral edema or neuropathy. He has no other complaints.    All other systems were reviewed with the patient and are negative.  MEDICAL HISTORY:  Past Medical History:  Diagnosis Date   Family history of adverse reaction to anesthesia    mother allergic to ether    Family history of breast cancer    Family history of colon cancer    Pneumonia    hx of 2014     SURGICAL HISTORY: Past Surgical History:  Procedure Laterality Date   APPENDECTOMY     CHOLECYSTECTOMY N/A 07/12/2018   Procedure: LAPAROSCOPIC CHOLECYSTECTOMY WITH INTRAOPERATIVE CHOLANGIOGRAM;  Surgeon: Johnathan Hausen, MD;  Location: [facility];  Service: General;  Laterality: N/A;   left wrist surgery      has plate in arm    NASAL POLYP SURGERY  2017   PORTACATH PLACEMENT N/A 03/10/2022   Procedure: INSERTION PORT-A-CATH;  Surgeon: Dwan Bolt, MD;  Location: [facility];  Service: General;  Laterality: N/A;   VENTRAL HERNIA REPAIR N/A 07/15/2014  Procedure: LAPAROSCOPIC REPAIR INCARCARTED UMBILICAL  VENTRAL HERNIA, ;  Surgeon: Fanny Skates, MD;  Location[facility];  Service: General;  Laterality: N/A;  With MESH    I have reviewed the social history and family history with the patient and they are unchanged from previous note.  ALLERGIES:  has No Known  Allergies.  MEDICATIONS:  Current Outpatient Medications  Medication Sig Dispense Refill   lidocaine-prilocaine (EMLA) cream Apply to affected area once 30 g 3   ondansetron (ZOFRAN) 8 MG tablet Take 1 tablet (8 mg total) by mouth 2 (two) times daily as needed. Start on day 3 after chemotherapy. 30 tablet 1   prochlorperazine (COMPAZINE) 10 MG tablet Take 1 tablet (10 mg total) by mouth every 6 (six) hours as needed (Nausea or vomiting). 30 tablet 1   No current facility-administered medications for this visit.   Facility-Administered Medications Ordered in Other Visits  Medication Dose Route Frequency Provider Last Rate Last Admin   fluorouracil (ADRUCIL) 5,600 mg in sodium chloride 0.9 % 138 mL chemo infusion  2,400 mg/m2 (Treatment Plan Recorded) Intravenous 1 day or 1 dose Truitt Merle, MD       irinotecan (CAMPTOSAR) 360 mg in sodium chloride 0.9 % 500 mL chemo infusion  150 mg/m2 (Treatment Plan Recorded) Intravenous Once Truitt Merle, MD       leucovorin 936 mg in sodium chloride 0.9 % 250 mL infusion  400 mg/m2 (Treatment Plan Recorded) Intravenous Once Truitt Merle, MD       oxaliplatin (ELOXATIN) 200 mg in dextrose 5 % 500 mL chemo infusion  85 mg/m2 (Treatment Plan Recorded) Intravenous Once Truitt Merle, MD 270 mL/hr at 04/11/22 1056 200 mg at 04/11/22 1056    PHYSICAL EXAMINATION: ECOG PERFORMANCE STATUS: 1 - Symptomatic but completely ambulatory  Vitals:   04/11/22 0905  BP: 119/88  Pulse: 73  Resp: 17  Temp: 97.6 F (36.4 C)  SpO2: 97%   Wt Readings from Last 3 Encounters:  04/11/22 244 lb 12.8 oz (111 kg)  03/29/22 246 lb 3.2 oz (111.7 kg)  03/13/22 244 lb 8 oz (110.9 kg)     GENERAL:alert, no distress and comfortable SKIN: skin color normal, no rashes or significant lesions EYES: normal, Conjunctiva are pink and non-injected, sclera clear  NEURO: alert & oriented x 3 with fluent speech  LABORATORY DATA:  I have reviewed the data as listed    Latest Ref Rng & Units  04/11/2022    8:29 AM 03/29/2022    8:30 AM 03/08/2022   12:06 PM  CBC  WBC 4.0 - 10.5 K/uL 6.8  6.5  6.9   Hemoglobin 13.0 - 17.0 g/dL 12.6  13.2  14.8   Hematocrit 39.0 - 52.0 % 34.6  36.9  42.1   Platelets 150 - 400 K/uL 227  196  275         Latest Ref Rng & Units 04/11/2022    8:29 AM 03/29/2022    8:30 AM 03/08/2022   12:06 PM  CMP  Glucose 70 - 99 mg/dL 132  132  108   BUN 6 - 20 mg/dL 8  11  12    Creatinine 0.61 - 1.24 mg/dL 0.71  0.77  0.85   Sodium 135 - 145 mmol/L 140  139  140   Potassium 3.5 - 5.1 mmol/L 3.5  3.8  4.2   Chloride 98 - 111 mmol/L 106  105  106   CO2 22 - 32 mmol/L 28  28  29  Calcium 8.9 - 10.3 mg/dL 9.2  8.9  9.7   Total Protein 6.5 - 8.1 g/dL 6.3  6.8  7.2   Total Bilirubin 0.3 - 1.2 mg/dL 0.5  0.5  0.6   Alkaline Phos 38 - 126 U/L 67  69  48   AST 15 - 41 U/L 18  18  17    ALT 0 - 44 U/L 24  30  17        RADIOGRAPHIC STUDIES: I have personally reviewed the radiological images as listed and agreed with the findings in the report. No results found.     ASSESSMENT & PLAN:  Steve Mills is a 56 y.o. male with   1. Pancreatic Head Adenocarcinoma, metastatic to liver, stage IV, MMR normal  -incidental finding on lung cancer screening chest CT on 01/19/22. 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  -liver biopsy on 02/28/22 confirmed poorly differentiated adenocarcinoma, compatible with clinical suspicion of pancreatic adenocarcinoma. MMR normal, not a candidate for immunotherapy.  -FO showed KRAS mutation, and MSS, no immunotherapy or targeted therapy available.  I reviewed with him today. -He is genetic testing was negative, not a candidate for PARP inhibitor -Started FOLFIRINOX on 03/13/22.  -Plan to repeat staging CT scan after cycle 6   2. Genetics -he has a strong family history of cancer, specifically liver and lung on the paternal  side and breast and colon on the maternal side. -testing performed 03/08/22, results are negative.   3. Goal of care discussion  -We previously discussed the incurable nature of his cancer, and the overall poor prognosis, especially if he does not have good response to chemotherapy or progress on chemo -The patient understands the goal of care is palliative. -he is full code now      PLAN:  --Due for Cycle 3, Day 1 of mFOLFIRINOX.  --Labs from today were reviewed and adequate for treatment. Mild anemia with Hgb 12.6. No other cytopenias. Creatinine and LFTs normal.  --Baseline CA19-9 from 03/29/2022 was elevated at 117.  --Patient has no prohibitive toxicities for chemotherapy. He will proceed with treatment today without any dose modifications --RTC on 04/26/2022 with labs, follow up visit with Dr.Feng before Cycle 4.    All questions were answered. The patient knows to call the clinic with any problems, questions or concerns. No barriers to learning was detected.  I have spent a total of 30 minutes minutes of face-to-face and non-face-to-face time, preparing to see the patient,  performing a medically appropriate examination, counseling and educating the patient,  documenting clinical information in the electronic health record,and care coordination.    Dede Query PA-C Dept of Hematology and [place] at [facility] Phone: [phone]

## 2022-04-13 ENCOUNTER — Inpatient Hospital Stay: Payer: BC Managed Care – PPO

## 2022-04-13 ENCOUNTER — Other Ambulatory Visit: Payer: Self-pay

## 2022-04-13 DIAGNOSIS — C250 Malignant neoplasm of head of pancreas: Secondary | ICD-10-CM | POA: Diagnosis not present

## 2022-04-13 DIAGNOSIS — C259 Malignant neoplasm of pancreas, unspecified: Secondary | ICD-10-CM | POA: Diagnosis not present

## 2022-04-13 DIAGNOSIS — C787 Secondary malignant neoplasm of liver and intrahepatic bile duct: Secondary | ICD-10-CM | POA: Diagnosis not present

## 2022-04-13 DIAGNOSIS — Z5111 Encounter for antineoplastic chemotherapy: Secondary | ICD-10-CM | POA: Diagnosis not present

## 2022-04-13 DIAGNOSIS — Z452 Encounter for adjustment and management of vascular access device: Secondary | ICD-10-CM | POA: Diagnosis not present

## 2022-04-13 DIAGNOSIS — D649 Anemia, unspecified: Secondary | ICD-10-CM | POA: Diagnosis not present

## 2022-04-13 MED ORDER — SODIUM CHLORIDE 0.9% FLUSH
10.0000 mL | INTRAVENOUS | Status: DC | PRN
  Administered 2022-04-13: 10 mL

## 2022-04-13 MED ORDER — HEPARIN SOD (PORK) LOCK FLUSH 100 UNIT/ML IV SOLN
500.0000 [IU] | Freq: Once | INTRAVENOUS | Status: AC | PRN
  Administered 2022-04-13: 500 [IU]

## 2022-04-24 ENCOUNTER — Other Ambulatory Visit: Payer: Self-pay | Admitting: Hematology

## 2022-04-24 DIAGNOSIS — C259 Malignant neoplasm of pancreas, unspecified: Secondary | ICD-10-CM

## 2022-04-25 MED FILL — Dexamethasone Sodium Phosphate Inj 100 MG/10ML: INTRAMUSCULAR | Qty: 1 | Status: AC

## 2022-04-25 MED FILL — Fosaprepitant Dimeglumine For IV Infusion 150 MG (Base Eq): INTRAVENOUS | Qty: 5 | Status: AC

## 2022-04-26 ENCOUNTER — Inpatient Hospital Stay: Payer: BC Managed Care – PPO

## 2022-04-26 ENCOUNTER — Inpatient Hospital Stay (HOSPITAL_BASED_OUTPATIENT_CLINIC_OR_DEPARTMENT_OTHER): Payer: BC Managed Care – PPO | Admitting: Hematology

## 2022-04-26 ENCOUNTER — Encounter: Payer: Self-pay | Admitting: Hematology

## 2022-04-26 ENCOUNTER — Inpatient Hospital Stay: Payer: BC Managed Care – PPO | Attending: Hematology

## 2022-04-26 ENCOUNTER — Other Ambulatory Visit: Payer: Self-pay

## 2022-04-26 VITALS — BP 146/88 | HR 58 | Temp 98.3°F | Resp 16 | Wt 247.5 lb

## 2022-04-26 DIAGNOSIS — R978 Other abnormal tumor markers: Secondary | ICD-10-CM | POA: Insufficient documentation

## 2022-04-26 DIAGNOSIS — C259 Malignant neoplasm of pancreas, unspecified: Secondary | ICD-10-CM

## 2022-04-26 DIAGNOSIS — Z800 Family history of malignant neoplasm of digestive organs: Secondary | ICD-10-CM | POA: Insufficient documentation

## 2022-04-26 DIAGNOSIS — C787 Secondary malignant neoplasm of liver and intrahepatic bile duct: Secondary | ICD-10-CM | POA: Diagnosis not present

## 2022-04-26 DIAGNOSIS — C250 Malignant neoplasm of head of pancreas: Secondary | ICD-10-CM | POA: Diagnosis not present

## 2022-04-26 DIAGNOSIS — Z801 Family history of malignant neoplasm of trachea, bronchus and lung: Secondary | ICD-10-CM | POA: Insufficient documentation

## 2022-04-26 DIAGNOSIS — Z5111 Encounter for antineoplastic chemotherapy: Secondary | ICD-10-CM | POA: Insufficient documentation

## 2022-04-26 DIAGNOSIS — Z803 Family history of malignant neoplasm of breast: Secondary | ICD-10-CM | POA: Diagnosis not present

## 2022-04-26 DIAGNOSIS — Z95828 Presence of other vascular implants and grafts: Secondary | ICD-10-CM

## 2022-04-26 LAB — CBC WITH DIFFERENTIAL (CANCER CENTER ONLY)
Abs Immature Granulocytes: 0.02 10*3/uL (ref 0.00–0.07)
Basophils Absolute: 0.1 10*3/uL (ref 0.0–0.1)
Basophils Relative: 1 %
Eosinophils Absolute: 0.4 10*3/uL (ref 0.0–0.5)
Eosinophils Relative: 6 %
HCT: 36.5 % — ABNORMAL LOW (ref 39.0–52.0)
Hemoglobin: 12.8 g/dL — ABNORMAL LOW (ref 13.0–17.0)
Immature Granulocytes: 0 %
Lymphocytes Relative: 40 %
Lymphs Abs: 2.8 10*3/uL (ref 0.7–4.0)
MCH: 30.4 pg (ref 26.0–34.0)
MCHC: 35.1 g/dL (ref 30.0–36.0)
MCV: 86.7 fL (ref 80.0–100.0)
Monocytes Absolute: 0.9 10*3/uL (ref 0.1–1.0)
Monocytes Relative: 12 %
Neutro Abs: 2.9 10*3/uL (ref 1.7–7.7)
Neutrophils Relative %: 41 %
Platelet Count: 202 10*3/uL (ref 150–400)
RBC: 4.21 MIL/uL — ABNORMAL LOW (ref 4.22–5.81)
RDW: 14.8 % (ref 11.5–15.5)
WBC Count: 7 10*3/uL (ref 4.0–10.5)
nRBC: 0 % (ref 0.0–0.2)

## 2022-04-26 LAB — CMP (CANCER CENTER ONLY)
ALT: 37 U/L (ref 0–44)
AST: 26 U/L (ref 15–41)
Albumin: 4.2 g/dL (ref 3.5–5.0)
Alkaline Phosphatase: 79 U/L (ref 38–126)
Anion gap: 6 (ref 5–15)
BUN: 12 mg/dL (ref 6–20)
CO2: 27 mmol/L (ref 22–32)
Calcium: 9.3 mg/dL (ref 8.9–10.3)
Chloride: 105 mmol/L (ref 98–111)
Creatinine: 0.76 mg/dL (ref 0.61–1.24)
GFR, Estimated: >60 mL/min (ref >60)
Glucose, Bld: 108 mg/dL — ABNORMAL HIGH (ref 70–99)
Potassium: 4 mmol/L (ref 3.5–5.1)
Sodium: 138 mmol/L (ref 135–145)
Total Bilirubin: 0.5 mg/dL (ref 0.3–1.2)
Total Protein: 6.8 g/dL (ref 6.5–8.1)

## 2022-04-26 MED ORDER — SODIUM CHLORIDE 0.9 % IV SOLN
10.0000 mg | Freq: Once | INTRAVENOUS | Status: AC
  Administered 2022-04-26: 10 mg via INTRAVENOUS
  Filled 2022-04-26: qty 10

## 2022-04-26 MED ORDER — SODIUM CHLORIDE 0.9 % IV SOLN
150.0000 mg | Freq: Once | INTRAVENOUS | Status: AC
  Administered 2022-04-26: 150 mg via INTRAVENOUS
  Filled 2022-04-26: qty 150

## 2022-04-26 MED ORDER — SODIUM CHLORIDE 0.9% FLUSH
10.0000 mL | Freq: Once | INTRAVENOUS | Status: AC
  Administered 2022-04-26: 10 mL

## 2022-04-26 MED ORDER — SODIUM CHLORIDE 0.9 % IV SOLN
400.0000 mg/m2 | Freq: Once | INTRAVENOUS | Status: AC
  Administered 2022-04-26: 948 mg via INTRAVENOUS
  Filled 2022-04-26: qty 47.4

## 2022-04-26 MED ORDER — PALONOSETRON HCL INJECTION 0.25 MG/5ML
0.2500 mg | Freq: Once | INTRAVENOUS | Status: AC
  Administered 2022-04-26: 0.25 mg via INTRAVENOUS
  Filled 2022-04-26: qty 5

## 2022-04-26 MED ORDER — ATROPINE SULFATE 1 MG/ML IV SOLN
0.5000 mg | Freq: Once | INTRAVENOUS | Status: AC | PRN
  Administered 2022-04-26: 0.5 mg via INTRAVENOUS
  Filled 2022-04-26: qty 1

## 2022-04-26 MED ORDER — ALTEPLASE 2 MG IJ SOLR
2.0000 mg | Freq: Once | INTRAMUSCULAR | Status: AC
  Administered 2022-04-26: 2 mg
  Filled 2022-04-26: qty 2

## 2022-04-26 MED ORDER — SODIUM CHLORIDE 0.9 % IV SOLN
2400.0000 mg/m2 | INTRAVENOUS | Status: DC
  Administered 2022-04-26: 5700 mg via INTRAVENOUS
  Filled 2022-04-26: qty 114

## 2022-04-26 MED ORDER — DEXTROSE 5 % IV SOLN: Freq: Once | INTRAVENOUS | Status: AC

## 2022-04-26 MED ORDER — OXALIPLATIN CHEMO INJECTION 100 MG/20ML
85.0000 mg/m2 | Freq: Once | INTRAVENOUS | Status: AC
  Administered 2022-04-26: 200 mg via INTRAVENOUS
  Filled 2022-04-26: qty 40

## 2022-04-26 MED ORDER — SODIUM CHLORIDE 0.9 % IV SOLN
150.0000 mg/m2 | Freq: Once | INTRAVENOUS | Status: AC
  Administered 2022-04-26: 360 mg via INTRAVENOUS
  Filled 2022-04-26: qty 15

## 2022-04-26 NOTE — Patient Instructions (Signed)
[facility]   Discharge Instructions: Thank you for choosing [facility] to provide your oncology and hematology care.   If you have a lab appointment with the [facility], please go directly to the [facility] and check in at the registration area.   Wear comfortable clothing and clothing appropriate for easy access to any Portacath or PICC line.   We strive to give you quality time with your provider. You may need to reschedule your appointment if you arrive late (15 or more minutes).  Arriving late affects you and other patients whose appointments are after yours.  Also, if you miss three or more appointments without notifying the office, you may be dismissed from the clinic at the provider's discretion.      For prescription refill requests, have your pharmacy contact our office and allow 72 hours for refills to be completed.    Today you received the following chemotherapy and/or immunotherapy agents: oxaliplatin, leucovorin, irinotecan, fluorouracil      To help prevent nausea and vomiting after your treatment, we encourage you to take your nausea medication as directed.  BELOW ARE SYMPTOMS THAT SHOULD BE REPORTED IMMEDIATELY: *FEVER GREATER THAN 100.4 F (38 C) OR HIGHER *CHILLS OR SWEATING *NAUSEA AND VOMITING THAT IS NOT CONTROLLED WITH YOUR NAUSEA MEDICATION *UNUSUAL SHORTNESS OF BREATH *UNUSUAL BRUISING OR BLEEDING *URINARY PROBLEMS (pain or burning when urinating, or frequent urination) *BOWEL PROBLEMS (unusual diarrhea, constipation, pain near the anus) TENDERNESS IN MOUTH AND THROAT WITH OR WITHOUT PRESENCE OF ULCERS (sore throat, sores in mouth, or a toothache) UNUSUAL RASH, SWELLING OR PAIN  UNUSUAL VAGINAL DISCHARGE OR ITCHING   Items with * indicate a potential emergency and should be followed up as soon as possible or go to the Emergency Department if any problems should occur.  Please show the CHEMOTHERAPY ALERT CARD  or IMMUNOTHERAPY ALERT CARD at check-in to the Emergency Department and triage nurse.  Should you have questions after your visit or need to cancel or reschedule your appointment, please contact [facility]  Dept: [phone]  and follow the prompts.  Office hours are 8:00 a.m. to 4:30 p.m. Monday - Friday. Please note that voicemails left after 4:00 p.m. may not be returned until the following business day.  We are closed weekends and major holidays. You have access to a nurse at all times for urgent questions. Please call the main number to the clinic Dept: [phone] and follow the prompts.   For any non-urgent questions, you may also contact your provider using MyChart. We now offer e-Visits for anyone 47 and older to request care online for non-urgent symptoms. For details visit [email or web address].   Also download the MyChart app! Go to the app store, search "MyChart", open the app, select [facility], and log in with your MyChart username and password.  Masks are optional in the cancer centers. If you would like for your care team to wear a mask while they are taking care of you, please let them know. You may have one support person who is at least 57 years old accompany you for your appointments.

## 2022-04-26 NOTE — Progress Notes (Signed)
[facility]   Telephone:([phone] [phone]) [phone]   Clinic Follow up Note   Patient Care Team: [name], MD as PCP - General (Family Medicine) Truitt Merle, MD as Consulting Physician (Oncology) Royston Bake, RN as Oncology Nurse Navigator (Oncology)  Date of Service:  04/26/2022  CHIEF COMPLAINT: f/u of metastatic pancreatic cancer  CURRENT THERAPY:  FOLFIRINOX, q14d, starting 03/13/22  ASSESSMENT & PLAN:  Steve Mills is a 56 y.o. male with   1. Pancreatic Head Adenocarcinoma, metastatic to liver, stage IV, MMR normal  -incidental finding on lung cancer screening chest CT on 01/19/22. 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  -liver biopsy on 02/28/22 confirmed poorly differentiated adenocarcinoma, compatible with clinical suspicion of pancreatic adenocarcinoma. MMR normal, not a candidate for immunotherapy.  -FO showed KRAS mutation, and MSS, no immunotherapy or targeted therapy available.   -he began FOLFIRINOX on 03/13/22. He is tolerating well overall with nausea, fatigue, and some brain fog, which are lasting longer with each cycle. -baseline CA 19-9 was elevated at 117. --we discussed his treatment plan today. We will obtain repeat CT after cycle 6, then decide whether to continue treatment or move to maintenance therapy. -lab reviewed, overall stable, adequate to proceed with C4 today. Repeat CA 19-9 obtained today is pending. -plan to repeat CT scan after C6    2. Genetics -he has a strong family history of cancer, specifically liver and lung on the paternal side and breast and colon on the maternal side. -testing performed 03/08/22, results are negative.     PLAN:  -proceed with C4 FOLFIRINOX today -lab, flush, f/u, and FOLFIRINOX in 2 and 4 weeks -plan for restaging CT after C6, will order on next visit    No problem-specific  Assessment & Plan notes found for this encounter.   SUMMARY OF ONCOLOGIC HISTORY: Oncology History  Pancreatic cancer metastasized to liver [facility])  02/28/2022 Cancer Staging   Staging form: Exocrine Pancreas, AJCC 8th Edition - Clinical stage from 02/28/2022: Stage IV (cT2, cN1, pM1) - Signed by Truitt Merle, MD on 03/02/2022 Stage prefix: Initial diagnosis   03/02/2022 Initial Diagnosis   Pancreatic cancer metastasized to liver ([place])   03/13/2022 - 04/13/2022 Chemotherapy   Patient is on Treatment Plan : PANCREAS Modified FOLFIRINOX q14d x 4 cycles     03/13/2022 -  Chemotherapy   Patient is on Treatment Plan : PANCREAS Modified FOLFIRINOX q14d x 4 cycles     03/18/2022 Genetic Testing   Negative genetic testing on the CancerNext-Expanded+RNAinsight panel.  APC c.4847A>T VUs identified.  The report date is March 18, 2022.  The CancerNext-Expanded gene panel offered by [facility] and includes sequencing and rearrangement analysis for the following 77 genes: AIP, ALK, APC*, ATM*, AXIN2, BAP1, BARD1, BLM, BMPR1A, BRCA1*, BRCA2*, BRIP1*, CDC73, CDH1*, CDK4, CDKN1B, CDKN2A, CHEK2*, CTNNA1, DICER1, FANCC, FH, FLCN, GALNT12, KIF1B, LZTR1, MAX, MEN1, MET, MLH1*, MSH2*, MSH3, MSH6*, MUTYH*, NBN, NF1*, NF2, NTHL1, PALB2*, PHOX2B, PMS2*, POT1, PRKAR1A, PTCH1, PTEN*, RAD51C*, RAD51D*, RB1, RECQL, RET, SDHA, SDHAF2, SDHB, SDHC, SDHD, SMAD4, SMARCA4, SMARCB1, SMARCE1, STK11, SUFU, TMEM127, TP53*, TSC1, TSC2, VHL and XRCC2 (sequencing and deletion/duplication); EGFR, EGLN1, HOXB13, KIT, MITF, PDGFRA, POLD1, and POLE (sequencing only); EPCAM and GREM1 (deletion/duplication only). DNA and RNA analyses performed for * genes.       INTERVAL HISTORY:  Steve Mills is here for a follow up of  metastatic pancreatic cancer. He was last seen by PA Murray Hodgkins on 04/11/22. He presents to the clinic accompanied by his wife. He reports he took about 5 days to recover after last cycle. He reports nausea and brain fog, denies  vomiting. He also reports feeling cold after infusion.   All other systems were reviewed with the patient and are negative.  MEDICAL HISTORY:  Past Medical History:  Diagnosis Date   Family history of adverse reaction to anesthesia    mother allergic to ether    Family history of breast cancer    Family history of colon cancer    Pneumonia    hx of 2014     SURGICAL HISTORY: Past Surgical History:  Procedure Laterality Date   APPENDECTOMY     CHOLECYSTECTOMY N/A 07/12/2018   Procedure: LAPAROSCOPIC CHOLECYSTECTOMY WITH INTRAOPERATIVE CHOLANGIOGRAM;  Surgeon: Johnathan Hausen, MD;  Location: [facility];  Service: General;  Laterality: N/A;   left wrist surgery      has plate in arm    NASAL POLYP SURGERY  2017   PORTACATH PLACEMENT N/A 03/10/2022   Procedure: INSERTION PORT-A-CATH;  Surgeon: Dwan Bolt, MD;  Location: [facility];  Service: General;  Laterality: N/A;   VENTRAL HERNIA REPAIR N/A 07/15/2014   Procedure: LAPAROSCOPIC REPAIR INCARCARTED UMBILICAL  VENTRAL HERNIA, ;  Surgeon: Fanny Skates, MD;  Location: [facility];  Service: General;  Laterality: N/A;  With MESH    I have reviewed the social history and family history with the patient and they are unchanged from previous note.  ALLERGIES:  has No Known Allergies.  MEDICATIONS:  No current outpatient medications on file.   No current facility-administered medications for this visit.   Facility-Administered Medications Ordered in Other Visits  Medication Dose Route Frequency Provider Last Rate Last Admin   fluorouracil (ADRUCIL) 5,700 mg in sodium chloride 0.9 % 136 mL chemo infusion  2,400 mg/m2 (Treatment Plan Recorded) Intravenous 1 day or 1 dose Truitt Merle, MD       irinotecan (CAMPTOSAR) 360 mg in sodium chloride 0.9 % 500 mL chemo infusion  150 mg/m2 (Treatment Plan Recorded) Intravenous Once Truitt Merle, MD 345 mL/hr at 04/26/22 1435 360 mg at 04/26/22 1435   leucovorin 948 mg in sodium chloride 0.9 % 250 mL infusion   400 mg/m2 (Treatment Plan Recorded) Intravenous Once Truitt Merle, MD 198 mL/hr at 04/26/22 1433 948 mg at 04/26/22 1433    PHYSICAL EXAMINATION: ECOG PERFORMANCE STATUS: 0 - Asymptomatic  There were no vitals filed for this visit. Wt Readings from Last 3 Encounters:  04/26/22 247 lb 8 oz (112.3 kg)  04/11/22 244 lb 12.8 oz (111 kg)  03/29/22 246 lb 3.2 oz (111.7 kg)     GENERAL:alert, no distress and comfortable SKIN: skin color normal, no rashes or significant lesions EYES: normal, Conjunctiva are pink and non-injected, sclera clear  NEURO: alert & oriented x 3 with fluent speech  LABORATORY DATA:  I have reviewed the data as listed    Latest Ref Rng & Units 04/26/2022   10:06 AM 04/11/2022    8:29 AM 03/29/2022    8:30 AM  CBC  WBC 4.0 - 10.5 K/uL 7.0  6.8  6.5   Hemoglobin 13.0 - 17.0 g/dL 12.8  12.6  13.2   Hematocrit 39.0 - 52.0 % 36.5  34.6  36.9   Platelets 150 - 400 K/uL 202  227  196         Latest Ref Rng & Units  04/26/2022   10:06 AM 04/11/2022    8:29 AM 03/29/2022    8:30 AM  CMP  Glucose 70 - 99 mg/dL 108  132  132   BUN 6 - 20 mg/dL _0 Creatinine 0.61 - 1.24 mg/dL 0.76  0.71  0.77   Sodium 135 - 145 mmol/L 138  140  139   Potassium 3.5 - 5.1 mmol/L 4.0  3.5  3.8   Chloride 98 - 111 mmol/L 105  106  105   CO2 22 - 32 mmol/L _1 Calcium 8.9 - 10.3 mg/dL 9.3  9.2  8.9   Total Protein 6.5 - 8.1 g/dL 6.8  6.3  6.8   Total Bilirubin 0.3 - 1.2 mg/dL 0.5  0.5  0.5   Alkaline Phos 38 - 126 U/L 79  67  69   AST 15 - 41 U/L _2 ALT 0 - 44 U/L 37  24  30       RADIOGRAPHIC STUDIES: I have personally reviewed the radiological images as listed and agreed with the findings in the report. No results found.    No orders of the defined types were placed in this encounter.  All questions were answered. The patient knows to call the clinic with any problems, questions or concerns. No barriers to learning was detected. The total time spent in  the appointment was 30 minutes.     Truitt Merle, MD 04/26/2022   I, Wilburn Mylar, am acting as scribe for Truitt Merle, MD.   I have reviewed the above documentation for accuracy and completeness, and I agree with the above.

## 2022-04-27 LAB — CANCER ANTIGEN 19-9: CA 19-9: 196 U/mL — ABNORMAL HIGH (ref 0–35)

## 2022-04-28 ENCOUNTER — Other Ambulatory Visit: Payer: Self-pay

## 2022-04-28 ENCOUNTER — Inpatient Hospital Stay: Payer: BC Managed Care – PPO

## 2022-04-28 VITALS — BP 121/85 | HR 64 | Temp 98.7°F | Resp 18

## 2022-04-28 DIAGNOSIS — C787 Secondary malignant neoplasm of liver and intrahepatic bile duct: Secondary | ICD-10-CM | POA: Diagnosis not present

## 2022-04-28 DIAGNOSIS — C250 Malignant neoplasm of head of pancreas: Secondary | ICD-10-CM | POA: Diagnosis not present

## 2022-04-28 DIAGNOSIS — C259 Malignant neoplasm of pancreas, unspecified: Secondary | ICD-10-CM

## 2022-04-28 DIAGNOSIS — Z800 Family history of malignant neoplasm of digestive organs: Secondary | ICD-10-CM | POA: Diagnosis not present

## 2022-04-28 DIAGNOSIS — Z5111 Encounter for antineoplastic chemotherapy: Secondary | ICD-10-CM | POA: Diagnosis not present

## 2022-04-28 DIAGNOSIS — Z801 Family history of malignant neoplasm of trachea, bronchus and lung: Secondary | ICD-10-CM | POA: Diagnosis not present

## 2022-04-28 DIAGNOSIS — Z803 Family history of malignant neoplasm of breast: Secondary | ICD-10-CM | POA: Diagnosis not present

## 2022-04-28 DIAGNOSIS — R978 Other abnormal tumor markers: Secondary | ICD-10-CM | POA: Diagnosis not present

## 2022-04-28 MED ORDER — SODIUM CHLORIDE 0.9% FLUSH
10.0000 mL | INTRAVENOUS | Status: DC | PRN
  Administered 2022-04-28: 10 mL

## 2022-04-28 MED ORDER — HEPARIN SOD (PORK) LOCK FLUSH 100 UNIT/ML IV SOLN
500.0000 [IU] | Freq: Once | INTRAVENOUS | Status: AC | PRN
  Administered 2022-04-28: 500 [IU]

## 2022-04-29 ENCOUNTER — Other Ambulatory Visit: Payer: Self-pay

## 2022-05-04 ENCOUNTER — Telehealth: Payer: Self-pay | Admitting: Hematology

## 2022-05-04 NOTE — Telephone Encounter (Signed)
Scheduled follow-up appointments per appointment request workqueue. Patient is aware. 

## 2022-05-05 ENCOUNTER — Other Ambulatory Visit: Payer: Self-pay

## 2022-05-10 ENCOUNTER — Ambulatory Visit: Payer: BC Managed Care – PPO | Admitting: Physician Assistant

## 2022-05-10 ENCOUNTER — Other Ambulatory Visit: Payer: BC Managed Care – PPO

## 2022-05-10 ENCOUNTER — Ambulatory Visit: Payer: BC Managed Care – PPO

## 2022-05-14 DIAGNOSIS — C259 Malignant neoplasm of pancreas, unspecified: Secondary | ICD-10-CM | POA: Diagnosis not present

## 2022-05-14 DIAGNOSIS — C787 Secondary malignant neoplasm of liver and intrahepatic bile duct: Secondary | ICD-10-CM | POA: Diagnosis not present

## 2022-05-14 NOTE — Progress Notes (Signed)
[facility] OFFICE PROGRESS NOTE  [name], MD [place] [address]  DIAGNOSIS:  f/u of metastatic pancreatic cancer  Oncology History  Pancreatic cancer metastasized to liver [facility])  02/28/2022 Cancer Staging   Staging form: Exocrine Pancreas, AJCC 8th Edition - Clinical stage from 02/28/2022: Stage IV (cT2, cN1, pM1) - Signed by Truitt Merle, MD on 03/02/2022 Stage prefix: Initial diagnosis   03/02/2022 Initial Diagnosis   Pancreatic cancer metastasized to liver ([place])   03/13/2022 - 04/13/2022 Chemotherapy   Patient is on Treatment Plan : PANCREAS Modified FOLFIRINOX q14d x 4 cycles     03/13/2022 -  Chemotherapy   Patient is on Treatment Plan : PANCREAS Modified FOLFIRINOX q14d x 4 cycles     03/18/2022 Genetic Testing   Negative genetic testing on the CancerNext-Expanded+RNAinsight panel.  APC c.4847A>T VUs identified.  The report date is March 18, 2022.  The CancerNext-Expanded gene panel offered by [facility] and includes sequencing and rearrangement analysis for the following 77 genes: AIP, ALK, APC*, ATM*, AXIN2, BAP1, BARD1, BLM, BMPR1A, BRCA1*, BRCA2*, BRIP1*, CDC73, CDH1*, CDK4, CDKN1B, CDKN2A, CHEK2*, CTNNA1, DICER1, FANCC, FH, FLCN, GALNT12, KIF1B, LZTR1, MAX, MEN1, MET, MLH1*, MSH2*, MSH3, MSH6*, MUTYH*, NBN, NF1*, NF2, NTHL1, PALB2*, PHOX2B, PMS2*, POT1, PRKAR1A, PTCH1, PTEN*, RAD51C*, RAD51D*, RB1, RECQL, RET, SDHA, SDHAF2, SDHB, SDHC, SDHD, SMAD4, SMARCA4, SMARCB1, SMARCE1, STK11, SUFU, TMEM127, TP53*, TSC1, TSC2, VHL and XRCC2 (sequencing and deletion/duplication); EGFR, EGLN1, HOXB13, KIT, MITF, PDGFRA, POLD1, and POLE (sequencing only); EPCAM and GREM1 (deletion/duplication only). DNA and RNA analyses performed for * genes.     CURRENT THERAPY: FOLFIRINOX, q14d, starting 03/13/22. He is here for cycle #5 today.   INTERVAL HISTORY: Steve Mills 56 y.o. male returns to the clinic today for a follow-up visit accompanied by his  wife.  The patient is feeling fair well today without any new concerning complaints except for he is concerned about his most recent Ca-19.9 from 2 weeks ago has increased from 117 to 196. He is worried that he is undergoing all these treatments and that it may not be effective. He is also wondering how long he will need to undergo treatment.  Per Dr. Ernestina Penna last note, the plan is to obtain a repeat CT scan after cycle #6 and decide whether to continue on treatment or move to maintenance treatment.  he patient unfortunately was diagnosed with metastatic pancreatic cancer in July 2023.  He is currently undergoing systemic chemotherapy and is status post 4 cycles. He denies new symptoms today. He reports his last cycle of treatment was more tolerable compared to the cycle before. He reports the cold sensitivity in his hands lasted a little longer.  He denies any changes in peripheral neuropathy compared to his "usual".  The patient has diabetes.  The patient reports that his appetite is fine and gained a few times but he does have diminished appetite for 3 to 5 days following chemotherapy in which he forces himself to eat.  He realizes that if he does not force himself to eat that he feels worse.  He does spearing some mild controlled nausea within the first 3 days following treatment.  Denies any vomiting.  The patient denies any fever or chills.  The patient reports night sweats only if he eats something sugary before bed.  The patient reports that he has never had any pain from his cancer unless he eats something greasy he may experience a dull epigastric pain.  He  did have a dull epigastric pain last night after eating greasy food.  His pain is mild and he does not need to take anything for pain.  He denies any chest pain since an episode of mild left-sided chest pain that he had a few appointments ago.  Denies any cough, shortness of breath, or hemoptysis.  He reports that his stools are softer after  chemotherapy.  He is here today for evaluation repeat blood work before undergoing cycle #5.     MEDICAL HISTORY: Past Medical History:  Diagnosis Date   Family history of adverse reaction to anesthesia    mother allergic to ether    Family history of breast cancer    Family history of colon cancer    Pneumonia    hx of 2014     ALLERGIES:  has No Known Allergies.  MEDICATIONS:  No current outpatient medications on file.   No current facility-administered medications for this visit.    SURGICAL HISTORY:  Past Surgical History:  Procedure Laterality Date   APPENDECTOMY     CHOLECYSTECTOMY N/A 07/12/2018   Procedure: LAPAROSCOPIC CHOLECYSTECTOMY WITH INTRAOPERATIVE CHOLANGIOGRAM;  Surgeon: Johnathan Hausen, MD;  Location: [facility];  Service: General;  Laterality: N/A;   left wrist surgery      has plate in arm    NASAL POLYP SURGERY  2017   PORTACATH PLACEMENT N/A 03/10/2022   Procedure: INSERTION PORT-A-CATH;  Surgeon: Dwan Bolt, MD;  Location: [facility];  Service: General;  Laterality: N/A;   VENTRAL HERNIA REPAIR N/A 07/15/2014   Procedure: LAPAROSCOPIC REPAIR INCARCARTED UMBILICAL  VENTRAL HERNIA, ;  Surgeon: Fanny Skates, MD;  Location: [facility];  Service: General;  Laterality: N/A;  With MESH    REVIEW OF SYSTEMS:   Review of Systems  Constitutional: Positive for fatigue and decreased appetite a few days following treatment.  Negative for chills, fever, and unexpected weight change.  HENT: Negative for mouth sores, nosebleeds, sore throat and trouble swallowing.   Eyes: Negative for eye problems and icterus.  Respiratory: Negative for cough, hemoptysis, shortness of breath and wheezing.   Cardiovascular: Negative for chest pain and leg swelling.  Gastrointestinal: Epigastric discomfort with eating greasy food (none at this time). Positive for softer stools. Positive for nausea 3 days following chemo without vomiting.  Genitourinary: Negative for bladder incontinence,  difficulty urinating, dysuria, frequency and hematuria.   Musculoskeletal: Negative for back pain, gait problem, neck pain and neck stiffness.  Skin: Negative for itching and rash.  Neurological: Negative for dizziness, extremity weakness, gait problem, headaches, light-headedness and seizures.  Hematological: Negative for adenopathy. Does not bruise/bleed easily.  Psychiatric/Behavioral: Negative for confusion, depression and sleep disturbance. The patient is not nervous/anxious.     PHYSICAL EXAMINATION:  Blood pressure (!) 131/92, pulse 69, temperature 98.3 F (36.8 C), temperature source Oral, resp. rate 15, weight 248 lb 11.2 oz (112.8 kg), SpO2 97 %.  ECOG PERFORMANCE STATUS: 1  Physical Exam  Constitutional: Oriented to person, place, and time and well-developed, well-nourished, and in no distress.  HENT:  Head: Normocephalic and atraumatic.  Mouth/Throat: Oropharynx is clear and moist. No oropharyngeal exudate.  Eyes: Conjunctivae are normal. Right eye exhibits no discharge. Left eye exhibits no discharge. No scleral icterus.  Neck: Normal range of motion. Neck supple.  Cardiovascular: Normal rate, regular rhythm, normal heart sounds and intact distal pulses.   Pulmonary/Chest: Effort normal and breath sounds normal. No respiratory distress. No wheezes. No rales.  Abdominal: Soft. Bowel sounds are  normal. Exhibits no distension and no mass. Mild RUQ tenderness (patient s/p cholecystectomy).  Musculoskeletal: Normal range of motion. Exhibits no edema.  Lymphadenopathy:    No cervical adenopathy.  Neurological: Alert and oriented to person, place, and time. Exhibits normal muscle tone. Gait normal. Coordination normal.  Skin: Skin is warm and dry. No rash noted. Not diaphoretic. No erythema. No pallor.  Psychiatric: Mood, memory and judgment normal.  Vitals reviewed.  LABORATORY DATA: Lab Results  Component Value Date   WBC 6.0 05/15/2022   HGB 12.5 (L) 05/15/2022   HCT  36.0 (L) 05/15/2022   MCV 89.6 05/15/2022   PLT 236 05/15/2022      Chemistry      Component Value Date/Time   NA 138 05/15/2022 0910   K 3.5 05/15/2022 0910   CL 105 05/15/2022 0910   CO2 26 05/15/2022 0910   BUN 9 05/15/2022 0910   CREATININE 0.75 05/15/2022 0910      Component Value Date/Time   CALCIUM 8.8 (L) 05/15/2022 0910   ALKPHOS 73 05/15/2022 0910   AST 27 05/15/2022 0910   ALT 33 05/15/2022 0910   BILITOT 0.4 05/15/2022 0910       RADIOGRAPHIC STUDIES:  No results found.   ASSESSMENT/PLAN:  Steve Mills is a 56 y.o. male with    1. Pancreatic Head Adenocarcinoma, metastatic to liver, stage IV, MMR normal  -Incidental finding on lung cancer screening chest CT on 01/19/22. 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  -liver biopsy on 02/28/22 confirmed poorly differentiated adenocarcinoma, compatible with clinical suspicion of pancreatic adenocarcinoma. MMR normal, not a candidate for immunotherapy.  -FO showed KRAS mutation, and MSS, no immunotherapy or targeted therapy available.   -he began FOLFIRINOX on 03/13/22. He is tolerating well overall with nausea, fatigue, and some brain fog.  -baseline CA 19-9 was elevated at 117. -Had repeat CA 19-9 2 weeks ago that increased to 196. The patient is concerned about this.  -Discussed with Dr. Burr Medico who agreed that we can move his CT scan up to be performed after C5 (which will be given today). The patient was given the number to central scheduling.  -On exam today, the patient had mild/minimal soreness in RUQ/epigastric region today to deep palpation. He is status post cholecystectomy. No rebound tenderness. He rated it as mild. Could be related to surgical changes, worsening liver metastatic disease/pancreatic cancer, etc. No leukocytosis on labs. He is well appearing today and in no acute distress. No fevers,  nausea, vomiting, or abnormal vitals. Will monitor for disease progression on upcoming imaging. Of course, if he develops new or worsening symptoms, advised to call back to be evaluated sooner.  --The scan will be reviewed at his next appointment, which will help determine the next steps.    2. Genetics -he has a strong family history of cancer, specifically liver and lung on the paternal side and breast and colon on the maternal side. -testing performed 03/08/22, results are negative.     PLAN:  -proceed with C5 FOLFIRINOX today -lab, flush, f/u, and FOLFIRINOX in 2 and 4 weeks -Ordered CT scan today to be performed before C6   Orders Placed This Encounter  Procedures   CT ABD PELVIS W/WO CM ONCOLOGY PANCREATIC PROTOCOL    Standing Status:   Future    Standing Expiration Date:   05/16/2023    Order Specific Question:  If indicated for the ordered procedure, I authorize the administration of contrast media per Radiology protocol    Answer:   Yes    Order Specific Question:   Preferred imaging location?    Answer:   [facility]    Order Specific Question:   Is Oral Contrast requested for this exam?    Answer:   Yes, Per Radiology protocol     The total time spent in the appointment was 20-29 minutes.   Farheen Pfahler L Azavion Bouillon, PA-C 05/15/22

## 2022-05-15 ENCOUNTER — Inpatient Hospital Stay: Payer: BC Managed Care – PPO

## 2022-05-15 ENCOUNTER — Other Ambulatory Visit: Payer: Self-pay | Admitting: Physician Assistant

## 2022-05-15 ENCOUNTER — Inpatient Hospital Stay (HOSPITAL_BASED_OUTPATIENT_CLINIC_OR_DEPARTMENT_OTHER): Payer: BC Managed Care – PPO | Admitting: Physician Assistant

## 2022-05-15 DIAGNOSIS — C787 Secondary malignant neoplasm of liver and intrahepatic bile duct: Secondary | ICD-10-CM | POA: Diagnosis not present

## 2022-05-15 DIAGNOSIS — C259 Malignant neoplasm of pancreas, unspecified: Secondary | ICD-10-CM

## 2022-05-15 DIAGNOSIS — Z803 Family history of malignant neoplasm of breast: Secondary | ICD-10-CM | POA: Diagnosis not present

## 2022-05-15 DIAGNOSIS — R978 Other abnormal tumor markers: Secondary | ICD-10-CM | POA: Diagnosis not present

## 2022-05-15 DIAGNOSIS — Z5111 Encounter for antineoplastic chemotherapy: Secondary | ICD-10-CM | POA: Diagnosis not present

## 2022-05-15 DIAGNOSIS — C250 Malignant neoplasm of head of pancreas: Secondary | ICD-10-CM | POA: Diagnosis not present

## 2022-05-15 DIAGNOSIS — Z800 Family history of malignant neoplasm of digestive organs: Secondary | ICD-10-CM | POA: Diagnosis not present

## 2022-05-15 DIAGNOSIS — Z801 Family history of malignant neoplasm of trachea, bronchus and lung: Secondary | ICD-10-CM | POA: Diagnosis not present

## 2022-05-15 LAB — CBC WITH DIFFERENTIAL (CANCER CENTER ONLY)
Abs Immature Granulocytes: 0.04 10*3/uL (ref 0.00–0.07)
Basophils Absolute: 0.1 10*3/uL (ref 0.0–0.1)
Basophils Relative: 1 %
Eosinophils Absolute: 0.5 10*3/uL (ref 0.0–0.5)
Eosinophils Relative: 8 %
HCT: 36 % — ABNORMAL LOW (ref 39.0–52.0)
Hemoglobin: 12.5 g/dL — ABNORMAL LOW (ref 13.0–17.0)
Immature Granulocytes: 1 %
Lymphocytes Relative: 43 %
Lymphs Abs: 2.6 10*3/uL (ref 0.7–4.0)
MCH: 31.1 pg (ref 26.0–34.0)
MCHC: 34.7 g/dL (ref 30.0–36.0)
MCV: 89.6 fL (ref 80.0–100.0)
Monocytes Absolute: 0.6 10*3/uL (ref 0.1–1.0)
Monocytes Relative: 9 %
Neutro Abs: 2.3 10*3/uL (ref 1.7–7.7)
Neutrophils Relative %: 38 %
Platelet Count: 236 10*3/uL (ref 150–400)
RBC: 4.02 MIL/uL — ABNORMAL LOW (ref 4.22–5.81)
RDW: 15.6 % — ABNORMAL HIGH (ref 11.5–15.5)
WBC Count: 6 10*3/uL (ref 4.0–10.5)
nRBC: 0 % (ref 0.0–0.2)

## 2022-05-15 LAB — CMP (CANCER CENTER ONLY)
ALT: 33 U/L (ref 0–44)
AST: 27 U/L (ref 15–41)
Albumin: 4 g/dL (ref 3.5–5.0)
Alkaline Phosphatase: 73 U/L (ref 38–126)
Anion gap: 7 (ref 5–15)
BUN: 9 mg/dL (ref 6–20)
CO2: 26 mmol/L (ref 22–32)
Calcium: 8.8 mg/dL — ABNORMAL LOW (ref 8.9–10.3)
Chloride: 105 mmol/L (ref 98–111)
Creatinine: 0.75 mg/dL (ref 0.61–1.24)
GFR, Estimated: >60 mL/min (ref >60)
Glucose, Bld: 166 mg/dL — ABNORMAL HIGH (ref 70–99)
Potassium: 3.5 mmol/L (ref 3.5–5.1)
Sodium: 138 mmol/L (ref 135–145)
Total Bilirubin: 0.4 mg/dL (ref 0.3–1.2)
Total Protein: 6.7 g/dL (ref 6.5–8.1)

## 2022-05-15 MED ORDER — SODIUM CHLORIDE 0.9 % IV SOLN
400.0000 mg/m2 | Freq: Once | INTRAVENOUS | Status: AC
  Administered 2022-05-15: 948 mg via INTRAVENOUS
  Filled 2022-05-15: qty 47.4

## 2022-05-15 MED ORDER — OXALIPLATIN CHEMO INJECTION 100 MG/20ML
85.0000 mg/m2 | Freq: Once | INTRAVENOUS | Status: AC
  Administered 2022-05-15: 200 mg via INTRAVENOUS
  Filled 2022-05-15: qty 40

## 2022-05-15 MED ORDER — DEXTROSE 5 % IV SOLN: Freq: Once | INTRAVENOUS | Status: AC

## 2022-05-15 MED ORDER — SODIUM CHLORIDE 0.9% FLUSH: 10.0000 mL | INTRAVENOUS | Status: DC | PRN

## 2022-05-15 MED ORDER — SODIUM CHLORIDE 0.9 % IV SOLN
150.0000 mg/m2 | Freq: Once | INTRAVENOUS | Status: AC
  Administered 2022-05-15: 360 mg via INTRAVENOUS
  Filled 2022-05-15: qty 15

## 2022-05-15 MED ORDER — ATROPINE SULFATE 1 MG/ML IV SOLN
0.5000 mg | Freq: Once | INTRAVENOUS | Status: AC | PRN
  Administered 2022-05-15: 0.5 mg via INTRAVENOUS
  Filled 2022-05-15: qty 1

## 2022-05-15 MED ORDER — SODIUM CHLORIDE 0.9 % IV SOLN
10.0000 mg | Freq: Once | INTRAVENOUS | Status: AC
  Administered 2022-05-15: 10 mg via INTRAVENOUS
  Filled 2022-05-15: qty 10

## 2022-05-15 MED ORDER — SODIUM CHLORIDE 0.9 % IV SOLN
2400.0000 mg/m2 | INTRAVENOUS | Status: DC
  Administered 2022-05-15: 5700 mg via INTRAVENOUS
  Filled 2022-05-15: qty 114

## 2022-05-15 MED ORDER — SODIUM CHLORIDE 0.9 % IV SOLN
150.0000 mg | Freq: Once | INTRAVENOUS | Status: AC
  Administered 2022-05-15: 150 mg via INTRAVENOUS
  Filled 2022-05-15: qty 150

## 2022-05-15 MED ORDER — PALONOSETRON HCL INJECTION 0.25 MG/5ML
0.2500 mg | Freq: Once | INTRAVENOUS | Status: AC
  Administered 2022-05-15: 0.25 mg via INTRAVENOUS
  Filled 2022-05-15: qty 5

## 2022-05-15 NOTE — Patient Instructions (Addendum)
[facility]  Discharge Instructions: Thank you for choosing [facility] to provide your oncology and hematology care.   If you have a lab appointment with the [facility], please go directly to the [facility] and check in at the registration area.   Wear comfortable clothing and clothing appropriate for easy access to any Portacath or PICC line.   We strive to give you quality time with your provider. You may need to reschedule your appointment if you arrive late (15 or more minutes).  Arriving late affects you and other patients whose appointments are after yours.  Also, if you miss three or more appointments without notifying the office, you may be dismissed from the clinic at the provider's discretion.      For prescription refill requests, have your pharmacy contact our office and allow 72 hours for refills to be completed.    Today you received the following chemotherapy and/or immunotherapy agents: Oxaliplatin/Irinotecan/Leucovorin/Fluorouracil      To help prevent nausea and vomiting after your treatment, we encourage you to take your nausea medication as directed.  BELOW ARE SYMPTOMS THAT SHOULD BE REPORTED IMMEDIATELY: *FEVER GREATER THAN 100.4 F (38 C) OR HIGHER *CHILLS OR SWEATING *NAUSEA AND VOMITING THAT IS NOT CONTROLLED WITH YOUR NAUSEA MEDICATION *UNUSUAL SHORTNESS OF BREATH *UNUSUAL BRUISING OR BLEEDING *URINARY PROBLEMS (pain or burning when urinating, or frequent urination) *BOWEL PROBLEMS (unusual diarrhea, constipation, pain near the anus) TENDERNESS IN MOUTH AND THROAT WITH OR WITHOUT PRESENCE OF ULCERS (sore throat, sores in mouth, or a toothache) UNUSUAL RASH, SWELLING OR PAIN  UNUSUAL VAGINAL DISCHARGE OR ITCHING   Items with * indicate a potential emergency and should be followed up as soon as possible or go to the Emergency Department if any problems should occur.  Please show the CHEMOTHERAPY ALERT CARD or  IMMUNOTHERAPY ALERT CARD at check-in to the Emergency Department and triage nurse.  Should you have questions after your visit or need to cancel or reschedule your appointment, please contact [facility]  Dept: [phone]  and follow the prompts.  Office hours are 8:00 a.m. to 4:30 p.m. Monday - Friday. Please note that voicemails left after 4:00 p.m. may not be returned until the following business day.  We are closed weekends and major holidays. You have access to a nurse at all times for urgent questions. Please call the main number to the clinic Dept: [phone] and follow the prompts.   For any non-urgent questions, you may also contact your provider using MyChart. We now offer e-Visits for anyone 57 and older to request care online for non-urgent symptoms. For details visit [email or web address].   Also download the MyChart app! Go to the app store, search "MyChart", open the app, select [facility], and log in with your MyChart username and password.  Masks are optional in the cancer centers. If you would like for your care team to wear a mask while they are taking care of you, please let them know. You may have one support person who is at least 57 years old accompany you for your appointments. The chemotherapy medication bag should finish at 46 hours, 96 hours, or 7 days. For example, if your pump is scheduled for 46 hours and it was put on at 4:00 p.m., it should finish at 2:00 p.m. the day it is scheduled to come off regardless of your appointment time.     Estimated time to finish at approximately 4:15  PM.   If the display on your pump reads "Low Volume" and it is beeping, take the batteries out of the pump and come to the cancer center for it to be taken off.   If the pump alarms go off prior to the pump reading "Low Volume" then call [phone] and someone can assist you.  If the plunger comes out and the chemotherapy medication is leaking out,  please use your home chemo spill kit to clean up the spill. Do NOT use paper towels or other household products.  If you have problems or questions regarding your pump, please call either [phone] (24 hours a day) or the cancer center Monday-Friday 8:00 a.m.- 4:30 p.m. at the clinic number and we will assist you. If you are unable to get assistance, then go to the nearest Emergency Department and ask the staff to contact the IV team for assistance.

## 2022-05-16 ENCOUNTER — Encounter: Payer: Self-pay | Admitting: Hematology

## 2022-05-16 ENCOUNTER — Encounter: Payer: Self-pay | Admitting: Physician Assistant

## 2022-05-17 ENCOUNTER — Inpatient Hospital Stay: Payer: BC Managed Care – PPO

## 2022-05-17 ENCOUNTER — Other Ambulatory Visit: Payer: Self-pay

## 2022-05-17 DIAGNOSIS — C250 Malignant neoplasm of head of pancreas: Secondary | ICD-10-CM | POA: Diagnosis not present

## 2022-05-17 DIAGNOSIS — C787 Secondary malignant neoplasm of liver and intrahepatic bile duct: Secondary | ICD-10-CM | POA: Diagnosis not present

## 2022-05-17 DIAGNOSIS — Z803 Family history of malignant neoplasm of breast: Secondary | ICD-10-CM | POA: Diagnosis not present

## 2022-05-17 DIAGNOSIS — Z800 Family history of malignant neoplasm of digestive organs: Secondary | ICD-10-CM | POA: Diagnosis not present

## 2022-05-17 DIAGNOSIS — Z5111 Encounter for antineoplastic chemotherapy: Secondary | ICD-10-CM | POA: Diagnosis not present

## 2022-05-17 DIAGNOSIS — Z801 Family history of malignant neoplasm of trachea, bronchus and lung: Secondary | ICD-10-CM | POA: Diagnosis not present

## 2022-05-17 DIAGNOSIS — R978 Other abnormal tumor markers: Secondary | ICD-10-CM | POA: Diagnosis not present

## 2022-05-17 DIAGNOSIS — C259 Malignant neoplasm of pancreas, unspecified: Secondary | ICD-10-CM

## 2022-05-17 MED ORDER — SODIUM CHLORIDE 0.9% FLUSH
10.0000 mL | INTRAVENOUS | Status: DC | PRN
  Administered 2022-05-17: 10 mL

## 2022-05-17 MED ORDER — HEPARIN SOD (PORK) LOCK FLUSH 100 UNIT/ML IV SOLN
500.0000 [IU] | Freq: Once | INTRAVENOUS | Status: AC | PRN
  Administered 2022-05-17: 500 [IU]

## 2022-05-23 ENCOUNTER — Encounter: Payer: Self-pay | Admitting: Hematology

## 2022-05-23 ENCOUNTER — Encounter: Payer: Self-pay | Admitting: Physician Assistant

## 2022-05-25 ENCOUNTER — Ambulatory Visit (HOSPITAL_COMMUNITY)
Admission: RE | Admit: 2022-05-25 | Discharge: 2022-05-25 | Disposition: A | Discharge status: Home / Self Care | Payer: BC Managed Care – PPO | Source: Ambulatory Visit | Attending: Physician Assistant | Admitting: Physician Assistant

## 2022-05-25 DIAGNOSIS — C787 Secondary malignant neoplasm of liver and intrahepatic bile duct: Secondary | ICD-10-CM | POA: Diagnosis not present

## 2022-05-25 DIAGNOSIS — K7689 Other specified diseases of liver: Secondary | ICD-10-CM | POA: Diagnosis not present

## 2022-05-25 DIAGNOSIS — C259 Malignant neoplasm of pancreas, unspecified: Secondary | ICD-10-CM | POA: Insufficient documentation

## 2022-05-25 DIAGNOSIS — K8689 Other specified diseases of pancreas: Secondary | ICD-10-CM | POA: Diagnosis not present

## 2022-05-25 MED ORDER — SODIUM CHLORIDE (PF) 0.9 % IJ SOLN
INTRAMUSCULAR | Status: AC
  Filled 2022-05-25: qty 50

## 2022-05-25 MED ORDER — IOHEXOL 300 MG/ML  SOLN
100.0000 mL | Freq: Once | INTRAMUSCULAR | Status: AC | PRN
  Administered 2022-05-25: 100 mL via INTRAVENOUS

## 2022-05-26 ENCOUNTER — Telehealth: Payer: Self-pay | Admitting: Medical Oncology

## 2022-05-26 MED FILL — Fosaprepitant Dimeglumine For IV Infusion 150 MG (Base Eq): INTRAVENOUS | Qty: 5 | Status: AC

## 2022-05-26 MED FILL — Dexamethasone Sodium Phosphate Inj 100 MG/10ML: INTRAMUSCULAR | Qty: 1 | Status: AC

## 2022-05-26 NOTE — Telephone Encounter (Signed)
Called CT report ( pt has appt with Dr. Burr Medico on Monday 10/09)  "IMPRESSION: 1. Multiple new and enlarged hypodense, rim enhancing liver metastases. 2. Unchanged, ill-defined hypoenhancing mass of the pancreatic uncinate, consistent with pancreatic adenocarcinoma 3. Unchanged small prominent portacaval and gastrohepatic ligament nodes, modestly suspicious for nodal metastases. No overtly enlarged lymph nodes in the abdomen or pelvis. 4. Prostatomegaly."

## 2022-05-29 ENCOUNTER — Inpatient Hospital Stay: Payer: BC Managed Care – PPO

## 2022-05-29 ENCOUNTER — Inpatient Hospital Stay: Payer: BC Managed Care – PPO | Admitting: Nurse Practitioner

## 2022-05-29 NOTE — Progress Notes (Signed)
I spoke to Steve Mills and reviewed CT results. Due to disease progression in the liver we will stop FOLFIRINOX. He knows not to come in today for treatment. He is being rescheduled to 10/10 to discuss further care planning when Dr. Burr Medico and I return to the office. My apologies for having this discussion by phone. He understands and appreciated the call. No other needs.   Steve Rue, NP

## 2022-05-30 ENCOUNTER — Encounter: Payer: Self-pay | Admitting: Nurse Practitioner

## 2022-05-30 ENCOUNTER — Inpatient Hospital Stay: Payer: BC Managed Care – PPO | Attending: Hematology

## 2022-05-30 ENCOUNTER — Other Ambulatory Visit: Payer: Self-pay

## 2022-05-30 ENCOUNTER — Inpatient Hospital Stay (HOSPITAL_BASED_OUTPATIENT_CLINIC_OR_DEPARTMENT_OTHER): Payer: BC Managed Care – PPO | Admitting: Nurse Practitioner

## 2022-05-30 VITALS — BP 125/91 | HR 71 | Temp 98.5°F | Resp 14 | Wt 249.3 lb

## 2022-05-30 DIAGNOSIS — Z5111 Encounter for antineoplastic chemotherapy: Secondary | ICD-10-CM | POA: Diagnosis not present

## 2022-05-30 DIAGNOSIS — C259 Malignant neoplasm of pancreas, unspecified: Secondary | ICD-10-CM

## 2022-05-30 DIAGNOSIS — C787 Secondary malignant neoplasm of liver and intrahepatic bile duct: Secondary | ICD-10-CM | POA: Insufficient documentation

## 2022-05-30 DIAGNOSIS — C250 Malignant neoplasm of head of pancreas: Secondary | ICD-10-CM | POA: Insufficient documentation

## 2022-05-30 DIAGNOSIS — Z95828 Presence of other vascular implants and grafts: Secondary | ICD-10-CM

## 2022-05-30 LAB — CBC WITH DIFFERENTIAL (CANCER CENTER ONLY)
Abs Immature Granulocytes: 0.01 10*3/uL (ref 0.00–0.07)
Basophils Absolute: 0 10*3/uL (ref 0.0–0.1)
Basophils Relative: 1 %
Eosinophils Absolute: 0.4 10*3/uL (ref 0.0–0.5)
Eosinophils Relative: 7 %
HCT: 35.9 % — ABNORMAL LOW (ref 39.0–52.0)
Hemoglobin: 12.9 g/dL — ABNORMAL LOW (ref 13.0–17.0)
Immature Granulocytes: 0 %
Lymphocytes Relative: 41 %
Lymphs Abs: 2.6 10*3/uL (ref 0.7–4.0)
MCH: 31.9 pg (ref 26.0–34.0)
MCHC: 35.9 g/dL (ref 30.0–36.0)
MCV: 88.6 fL (ref 80.0–100.0)
Monocytes Absolute: 0.8 10*3/uL (ref 0.1–1.0)
Monocytes Relative: 12 %
Neutro Abs: 2.4 10*3/uL (ref 1.7–7.7)
Neutrophils Relative %: 39 %
Platelet Count: 228 10*3/uL (ref 150–400)
RBC: 4.05 MIL/uL — ABNORMAL LOW (ref 4.22–5.81)
RDW: 15.7 % — ABNORMAL HIGH (ref 11.5–15.5)
WBC Count: 6.3 10*3/uL (ref 4.0–10.5)
nRBC: 0 % (ref 0.0–0.2)

## 2022-05-30 LAB — CMP (CANCER CENTER ONLY)
ALT: 27 U/L (ref 0–44)
AST: 26 U/L (ref 15–41)
Albumin: 4.3 g/dL (ref 3.5–5.0)
Alkaline Phosphatase: 84 U/L (ref 38–126)
Anion gap: 5 (ref 5–15)
BUN: 14 mg/dL (ref 6–20)
CO2: 28 mmol/L (ref 22–32)
Calcium: 9.1 mg/dL (ref 8.9–10.3)
Chloride: 105 mmol/L (ref 98–111)
Creatinine: 0.77 mg/dL (ref 0.61–1.24)
GFR, Estimated: >60 mL/min (ref >60)
Glucose, Bld: 104 mg/dL — ABNORMAL HIGH (ref 70–99)
Potassium: 4 mmol/L (ref 3.5–5.1)
Sodium: 138 mmol/L (ref 135–145)
Total Bilirubin: 0.4 mg/dL (ref 0.3–1.2)
Total Protein: 7.1 g/dL (ref 6.5–8.1)

## 2022-05-30 MED ORDER — SODIUM CHLORIDE 0.9% FLUSH
10.0000 mL | Freq: Once | INTRAVENOUS | Status: AC
  Administered 2022-05-30: 10 mL

## 2022-05-30 MED ORDER — HEPARIN SOD (PORK) LOCK FLUSH 100 UNIT/ML IV SOLN
500.0000 [IU] | Freq: Once | INTRAVENOUS | Status: AC
  Administered 2022-05-30: 500 [IU]

## 2022-05-30 NOTE — Progress Notes (Addendum)
Steve Mills   Telephone:([phone]) [phone] Fax:(336) [phone]   Clinic Follow up Note   Patient Care Team: [name], MD as PCP - General (Family Medicine) Truitt Merle, MD as Consulting Physician (Oncology) 05/30/2022  CHIEF COMPLAINT: Follow-up metastatic pancreatic cancer  SUMMARY OF ONCOLOGIC HISTORY: Oncology History  Pancreatic cancer metastasized to liver [facility])  02/28/2022 Cancer Staging   Staging form: Exocrine Pancreas, AJCC 8th Edition - Clinical stage from 02/28/2022: Stage IV (cT2, cN1, pM1) - Signed by Truitt Merle, MD on 03/02/2022 Stage prefix: Initial diagnosis   03/02/2022 Initial Diagnosis   Pancreatic cancer metastasized to liver ([facility])   03/13/2022 - 04/13/2022 Chemotherapy   Patient is on Treatment Plan : PANCREAS Modified FOLFIRINOX q14d x 4 cycles     03/13/2022 -  Chemotherapy   Patient is on Treatment Plan : PANCREAS Modified FOLFIRINOX q14d x 4 cycles     03/18/2022 Genetic Testing   Negative genetic testing on the CancerNext-Expanded+RNAinsight panel.  APC [id number] VUs identified.  The report date is March 18, 2022.  The CancerNext-Expanded gene panel offered by [facility] and includes sequencing and rearrangement analysis for the following 77 genes: AIP, ALK, APC*, ATM*, AXIN2, BAP1, BARD1, BLM, BMPR1A, BRCA1*, BRCA2*, BRIP1*, CDC73, CDH1*, CDK4, CDKN1B, CDKN2A, CHEK2*, CTNNA1, DICER1, FANCC, FH, FLCN, GALNT12, KIF1B, LZTR1, MAX, MEN1, MET, MLH1*, MSH2*, MSH3, MSH6*, MUTYH*, NBN, NF1*, NF2, NTHL1, PALB2*, PHOX2B, PMS2*, POT1, PRKAR1A, PTCH1, PTEN*, RAD51C*, RAD51D*, RB1, RECQL, RET, SDHA, SDHAF2, SDHB, SDHC, SDHD, SMAD4, SMARCA4, SMARCB1, SMARCE1, STK11, SUFU, TMEM127, TP53*, TSC1, TSC2, VHL and XRCC2 (sequencing and deletion/duplication); EGFR, EGLN1, HOXB13, KIT, MITF, PDGFRA, POLD1, and POLE (sequencing only); EPCAM and GREM1 (deletion/duplication only). DNA and RNA analyses performed for * genes.    05/25/2022 Progression   Rising CA 19-9, CT  CAP IMPRESSION: 1. Multiple new and enlarged hypodense, rim enhancing liver metastases. 2. Unchanged, ill-defined hypoenhancing mass of the pancreatic uncinate, consistent with pancreatic adenocarcinoma 3. Unchanged small prominent portacaval and gastrohepatic ligament nodes, modestly suspicious for nodal metastases. No overtly enlarged lymph nodes in the abdomen or pelvis. 4. Prostatomegaly.      CURRENT THERAPY: FOLFIRINOX q. 14 days starting 03/13/2022  INTERVAL HISTORY: Steve Mills returns for follow-up as scheduled, last seen by my colleague Cassie, Robbins on 05/08/2022 and completed cycle 5 FOLFIRINOX. He finds that he tolerates chemo better if he sleeps during infusion and eats smaller but more frequently for the 3 days with 5FU pump. Energy is low and he alternates with hot flashes/cold sweats during those days, recovers around day 5. No fevers or shaking chills. Stools are loose right after treatment. He takes 1 compazine the night of chemo then has no nausea. Cold sensitivity is lasting longer each time, and he has mild numbness/tingling in fingertips today. He functions normally and continues to work some. He has new mild RUQ discomfort he wouldn't even call pain, more like a tugging if he turns a certain way. Doesn't require medication. Due to rising CA 19-9 he underwent restaging CT CAP.  Here today to discuss results and neck steps.   All other systems were reviewed with the patient and are negative.  MEDICAL HISTORY:  Past Medical History:  Diagnosis Date   Family history of adverse reaction to anesthesia    mother allergic to ether    Family history of breast cancer    Family history of colon cancer    Pneumonia    hx of 2014     SURGICAL HISTORY: Past Surgical  History:  Procedure Laterality Date   APPENDECTOMY     CHOLECYSTECTOMY N/A 07/12/2018   Procedure: LAPAROSCOPIC CHOLECYSTECTOMY WITH INTRAOPERATIVE CHOLANGIOGRAM;  Surgeon: Johnathan Hausen, MD;  Location: [facility];   Service: General;  Laterality: N/A;   left wrist surgery      has plate in arm    NASAL POLYP SURGERY  2017   PORTACATH PLACEMENT N/A 03/10/2022   Procedure: INSERTION PORT-A-CATH;  Surgeon: Dwan Bolt, MD;  Location: [facility];  Service: General;  Laterality: N/A;   VENTRAL HERNIA REPAIR N/A 07/15/2014   Procedure: LAPAROSCOPIC REPAIR INCARCARTED UMBILICAL  VENTRAL HERNIA, ;  Surgeon: Fanny Skates, MD;  Location: [facility];  Service: General;  Laterality: N/A;  With MESH    I have reviewed the social history and family history with the patient and they are unchanged from previous note.  ALLERGIES:  has No Known Allergies.  MEDICATIONS:  No current outpatient medications on file.   No current facility-administered medications for this visit.    PHYSICAL EXAMINATION: ECOG PERFORMANCE STATUS: 1 - Symptomatic but completely ambulatory  Vitals:   05/30/22 0951  BP: (!) 125/91  Pulse: 71  Resp: 14  Temp: 98.5 F (36.9 C)  SpO2: 95%   Filed Weights   05/30/22 0951  Weight: 249 lb 4.8 oz (113.1 kg)    GENERAL:alert, no distress and comfortable SKIN: no rash  EYES: sclera clear LUNGS: clear with normal breathing effort HEART: regular rate & rhythm, no lower extremity edema ABDOMEN:abdomen soft, non-tender and normal bowel sounds NEURO: alert & oriented x 3 with fluent speech, no focal motor/sensory deficits.  Intact peripheral vibratory sense over the fingertips per tuning fork exam PAC without erythema  LABORATORY DATA:  I have reviewed the data as listed    Latest Ref Rng & Units 05/30/2022    9:33 AM 05/15/2022    9:10 AM 04/26/2022   10:06 AM  CBC  WBC 4.0 - 10.5 K/uL 6.3  6.0  7.0   Hemoglobin 13.0 - 17.0 g/dL 12.9  12.5  12.8   Hematocrit 39.0 - 52.0 % 35.9  36.0  36.5   Platelets 150 - 400 K/uL 228  236  202         Latest Ref Rng & Units 05/30/2022    9:33 AM 05/15/2022    9:10 AM 04/26/2022   10:06 AM  CMP  Glucose 70 - 99 mg/dL 104  166  108   BUN 6 - 20  mg/dL 14  9  12    Creatinine 0.61 - 1.24 mg/dL 0.77  0.75  0.76   Sodium 135 - 145 mmol/L 138  138  138   Potassium 3.5 - 5.1 mmol/L 4.0  3.5  4.0   Chloride 98 - 111 mmol/L 105  105  105   CO2 22 - 32 mmol/L 28  26  27    Calcium 8.9 - 10.3 mg/dL 9.1  8.8  9.3   Total Protein 6.5 - 8.1 g/dL 7.1  6.7  6.8   Total Bilirubin 0.3 - 1.2 mg/dL 0.4  0.4  0.5   Alkaline Phos 38 - 126 U/L 84  73  79   AST 15 - 41 U/L 26  27  26    ALT 0 - 44 U/L 27  33  37       RADIOGRAPHIC STUDIES: I have personally reviewed the radiological images as listed and agreed with the findings in the report. No results found.   ASSESSMENT & PLAN:  Steve Mills is a 56 y.o. male with    1. Pancreatic Head Adenocarcinoma, metastatic to liver, stage IV, MMR normal  -incidental finding on lung cancer screening chest CT on 01/19/22. 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  -liver biopsy on 02/28/22 confirmed poorly differentiated adenocarcinoma, compatible with clinical suspicion of pancreatic adenocarcinoma. MMR normal, not a candidate for immunotherapy.  -FO showed KRAS mutation, and MSS, no immunotherapy or targeted therapy available. BRCA1/2 was negative, not a candidate for PARP inhibitor  -He began first-line palliative FOLFIRINOX on 03/13/22.  -baseline CA 19-9 was elevated at 117, increased after cycle 3 to 196 -Steve Mills appears stable. He completed 5 cycles of FOLFIRINOX, tolerated moderately well with fatigue, hot flashes/cold fluctuation, cold sensitivity/neuropathy, and mild nausea/loose stools. Side effects are adequately managed with supportive care at home. He is able to recover and function well around day 5. -Due to rising CA 19-9 after cycle 3 (from 117 to 196) he underwent restaging CT abdomen/pelvis pancreatic protocol which unfortunately shows multiple new and enlarging hypodense  rim-enhancing liver metastases, including an index lesion in segment V which enlarged from 2.3 x 2.2 to 3.8 x 3.4 cm and a new lesion in the anterior dome measuring 1.9 x 1.7 cm.  The pancreatic lesion and prominent abdominal lymph nodes are stable.  No other new metastatic disease -Patient seen with Dr. Burr Medico.  We recommend to discontinue FOLFIRINOX due to disease progression -He is not a candidate for immunotherapy, targeted therapy, or PARP inhibitor -We recommend to move to second line palliative regimen gemcitabine and Abraxane on days 1 and 8 q. 21 days.   -Chemotherapy consent: Side effects including but not limited to fatigue, nausea, vomiting, diarrhea, hair loss, neuropathy, fluid retention, liver and kidney dysfunction, neutropenic fever, need for blood transfusion, bleeding, were discussed with patient in great detail. He agrees to proceed.  The goal is palliative, for disease control, and to prolong his life. However, given the fact that he progressed quickly on first line most intensive chemo, he understands the benefit is not guaranteed  -He notes that he is not symptomatic from cancer, his only issues are from chemo. He brings up a good point and so we also discussed the option of comfort/palliative care alone, to allow him good quality of life away from the cancer center/treatment. -He remains interested in treatment and determined to try all available options including clinical trial down the line if he is a candidate -He has recovered well from last cycle of chemo, will start treatment soon    2. Genetics -he has a strong family history of cancer, specifically liver and lung on the paternal side and breast and colon on the maternal side. -testing performed 03/08/22, results are negative. -We briefly discussed cancer risk in his children.  Patient and his wife understand having a first-degree relative with pancreatic cancer increases their children's risk even with negative genetic  panel   Plan: -Restaging CT and today's labs reviewed -Discontinue FOLFIRINOX due to disease progression -Discussed goals of care -Start second line gemcitabine and Abraxane (day 1, 8 q21 days) this week or next pending availabilit -Follow up with day 8 chemo -Pt seen with Dr. Burr Medico   All questions were answered. The patient knows to call the clinic with any problems, questions or concerns. No barriers to learning were detected.     Alla Feeling, NP 05/30/22  Addendum I have seen the patient, examined him. I agree with the assessment and and plan and have edited the notes.   Steve Mills is clinically doing well and tolerating chemo with expected manageable AEs.  Unfortunately his restaging CT scan showed progressive liver metastasis.  I personally reviewed his scan images and discussed the findings with patient and his wife.  I recommend changing treatment to second-line chemo gemcitabine and Abraxane on day 1 and 8 every 21 days.  His FO and genetic test showed no targetable mutations, and he is not a candidate for immunotherapy.  We also discussed option of clinical trial, and will start clinical trial searching for him. All questions were answered, he agrees to proceed with gem/abraxane next week. Chemo consent obtained, he understands the goal of therapy is palliative to prolong his life.  Truitt Merle MD 05/30/2022

## 2022-05-30 NOTE — Progress Notes (Signed)
DISCONTINUE ON PATHWAY REGIMEN - Pancreatic Adenocarcinoma     A cycle is every 14 days:     Irinotecan      Oxaliplatin      Leucovorin      Fluorouracil   **Always confirm dose/schedule in your pharmacy ordering system**  REASON: Disease Progression PRIOR TREATMENT: PANOS98: mFOLFIRINOX q14 Days Until Progression or Toxicity TREATMENT RESPONSE: Progressive Disease (PD)  START ON PATHWAY REGIMEN - Pancreatic Adenocarcinoma     A cycle is every 28 days:     Nab-paclitaxel (protein bound)      Gemcitabine   **Always confirm dose/schedule in your pharmacy ordering system**  Patient Characteristics: Metastatic Disease, Second Line, MSS/pMMR or MSI Unknown, Fluoropyrimidine-Based Therapy First Line Therapeutic Status: Metastatic Disease Line of Therapy: Second Line Microsatellite/Mismatch Repair Status: MSS/pMMR Intent of Therapy: Non-Curative / Palliative Intent, Discussed with Patient

## 2022-05-31 ENCOUNTER — Telehealth: Payer: Self-pay | Admitting: Hematology

## 2022-05-31 ENCOUNTER — Inpatient Hospital Stay: Payer: BC Managed Care – PPO

## 2022-05-31 NOTE — Telephone Encounter (Signed)
Scheduled follow-up appointments per 10/10 los. Patient is aware. 

## 2022-06-01 ENCOUNTER — Other Ambulatory Visit: Payer: Self-pay

## 2022-06-02 ENCOUNTER — Other Ambulatory Visit: Payer: Self-pay

## 2022-06-02 LAB — CANCER ANTIGEN 19-9: CA 19-9: 244 U/mL — ABNORMAL HIGH (ref 0–35)

## 2022-06-05 ENCOUNTER — Inpatient Hospital Stay: Payer: BC Managed Care – PPO

## 2022-06-05 ENCOUNTER — Other Ambulatory Visit: Payer: Self-pay

## 2022-06-05 VITALS — BP 127/96 | HR 65 | Temp 98.2°F | Resp 17

## 2022-06-05 DIAGNOSIS — Z5111 Encounter for antineoplastic chemotherapy: Secondary | ICD-10-CM | POA: Diagnosis not present

## 2022-06-05 DIAGNOSIS — C259 Malignant neoplasm of pancreas, unspecified: Secondary | ICD-10-CM

## 2022-06-05 DIAGNOSIS — Z95828 Presence of other vascular implants and grafts: Secondary | ICD-10-CM

## 2022-06-05 DIAGNOSIS — C787 Secondary malignant neoplasm of liver and intrahepatic bile duct: Secondary | ICD-10-CM | POA: Diagnosis not present

## 2022-06-05 DIAGNOSIS — C250 Malignant neoplasm of head of pancreas: Secondary | ICD-10-CM | POA: Diagnosis not present

## 2022-06-05 LAB — CBC WITH DIFFERENTIAL (CANCER CENTER ONLY)
Abs Immature Granulocytes: 0.06 10*3/uL (ref 0.00–0.07)
Basophils Absolute: 0.1 10*3/uL (ref 0.0–0.1)
Basophils Relative: 1 %
Eosinophils Absolute: 0.5 10*3/uL (ref 0.0–0.5)
Eosinophils Relative: 7 %
HCT: 36.3 % — ABNORMAL LOW (ref 39.0–52.0)
Hemoglobin: 12.8 g/dL — ABNORMAL LOW (ref 13.0–17.0)
Immature Granulocytes: 1 %
Lymphocytes Relative: 38 %
Lymphs Abs: 2.8 10*3/uL (ref 0.7–4.0)
MCH: 31.6 pg (ref 26.0–34.0)
MCHC: 35.3 g/dL (ref 30.0–36.0)
MCV: 89.6 fL (ref 80.0–100.0)
Monocytes Absolute: 0.9 10*3/uL (ref 0.1–1.0)
Monocytes Relative: 12 %
Neutro Abs: 3 10*3/uL (ref 1.7–7.7)
Neutrophils Relative %: 41 %
Platelet Count: 282 10*3/uL (ref 150–400)
RBC: 4.05 MIL/uL — ABNORMAL LOW (ref 4.22–5.81)
RDW: 15.2 % (ref 11.5–15.5)
WBC Count: 7.4 10*3/uL (ref 4.0–10.5)
nRBC: 0 % (ref 0.0–0.2)

## 2022-06-05 LAB — CMP (CANCER CENTER ONLY)
ALT: 23 U/L (ref 0–44)
AST: 19 U/L (ref 15–41)
Albumin: 4.1 g/dL (ref 3.5–5.0)
Alkaline Phosphatase: 83 U/L (ref 38–126)
Anion gap: 5 (ref 5–15)
BUN: 11 mg/dL (ref 6–20)
CO2: 28 mmol/L (ref 22–32)
Calcium: 9.2 mg/dL (ref 8.9–10.3)
Chloride: 105 mmol/L (ref 98–111)
Creatinine: 0.73 mg/dL (ref 0.61–1.24)
GFR, Estimated: >60 mL/min (ref >60)
Glucose, Bld: 130 mg/dL — ABNORMAL HIGH (ref 70–99)
Potassium: 3.6 mmol/L (ref 3.5–5.1)
Sodium: 138 mmol/L (ref 135–145)
Total Bilirubin: 0.4 mg/dL (ref 0.3–1.2)
Total Protein: 6.7 g/dL (ref 6.5–8.1)

## 2022-06-05 MED ORDER — PROCHLORPERAZINE MALEATE 10 MG PO TABS
10.0000 mg | ORAL_TABLET | Freq: Once | ORAL | Status: AC
  Administered 2022-06-05: 10 mg via ORAL
  Filled 2022-06-05: qty 1

## 2022-06-05 MED ORDER — SODIUM CHLORIDE 0.9 % IV SOLN: Freq: Once | INTRAVENOUS | Status: AC

## 2022-06-05 MED ORDER — SODIUM CHLORIDE 0.9 % IV SOLN
1000.0000 mg/m2 | Freq: Once | INTRAVENOUS | Status: AC
  Administered 2022-06-05: 2394 mg via INTRAVENOUS
  Filled 2022-06-05: qty 62.96

## 2022-06-05 MED ORDER — PACLITAXEL PROTEIN-BOUND CHEMO INJECTION 100 MG
125.0000 mg/m2 | Freq: Once | INTRAVENOUS | Status: AC
  Administered 2022-06-05: 300 mg via INTRAVENOUS
  Filled 2022-06-05: qty 60

## 2022-06-05 MED ORDER — HEPARIN SOD (PORK) LOCK FLUSH 100 UNIT/ML IV SOLN
500.0000 [IU] | Freq: Once | INTRAVENOUS | Status: AC | PRN
  Administered 2022-06-05: 500 [IU]

## 2022-06-05 MED ORDER — SODIUM CHLORIDE 0.9% FLUSH
10.0000 mL | Freq: Once | INTRAVENOUS | Status: AC
  Administered 2022-06-05: 10 mL

## 2022-06-05 MED ORDER — SODIUM CHLORIDE 0.9% FLUSH
10.0000 mL | INTRAVENOUS | Status: DC | PRN
  Administered 2022-06-05: 10 mL

## 2022-06-05 NOTE — Patient Instructions (Signed)
[facility]  Discharge Instructions: Thank you for choosing [facility] to provide your oncology and hematology care.   If you have a lab appointment with the Cancer Center, please go directly to the Cancer Center and check in at the registration area.   Wear comfortable clothing and clothing appropriate for easy access to any Portacath or PICC line.   We strive to give you quality time with your provider. You may need to reschedule your appointment if you arrive late (15 or more minutes).  Arriving late affects you and other patients whose appointments are after yours.  Also, if you miss three or more appointments without notifying the office, you may be dismissed from the clinic at the provider's discretion.      For prescription refill requests, have your pharmacy contact our office and allow 72 hours for refills to be completed.    Today you received the following chemotherapy and/or immunotherapy agents Paclitaxel Nanoparticle Albumin-Bound Injection (Abraxane), Paclitaxel Nanoparticle Albumin-Bound Injection (Abraxane)   To help prevent nausea and vomiting after your treatment, we encourage you to take your nausea medication as directed.  BELOW ARE SYMPTOMS THAT SHOULD BE REPORTED IMMEDIATELY: *FEVER GREATER THAN 100.4 F (38 C) OR HIGHER *CHILLS OR SWEATING *NAUSEA AND VOMITING THAT IS NOT CONTROLLED WITH YOUR NAUSEA MEDICATION *UNUSUAL SHORTNESS OF BREATH *UNUSUAL BRUISING OR BLEEDING *URINARY PROBLEMS (pain or burning when urinating, or frequent urination) *BOWEL PROBLEMS (unusual diarrhea, constipation, pain near the anus) TENDERNESS IN MOUTH AND THROAT WITH OR WITHOUT PRESENCE OF ULCERS (sore throat, sores in mouth, or a toothache) UNUSUAL RASH, SWELLING OR PAIN  UNUSUAL VAGINAL DISCHARGE OR ITCHING   Items with * indicate a potential emergency and should be followed up as soon as possible or go to the Emergency Department if any  problems should occur.  Please show the CHEMOTHERAPY ALERT CARD or IMMUNOTHERAPY ALERT CARD at check-in to the Emergency Department and triage nurse.  Should you have questions after your visit or need to cancel or reschedule your appointment, please contact [facility]  Dept: [phone]  and follow the prompts.  Office hours are 8:00 a.m. to 4:30 p.m. Monday - Friday. Please note that voicemails left after 4:00 p.m. may not be returned until the following business day.  We are closed weekends and major holidays. You have access to a nurse at all times for urgent questions. Please call the main number to the clinic Dept: [phone] and follow the prompts.   For any non-urgent questions, you may also contact your provider using MyChart. We now offer e-Visits for anyone 18 and older to request care online for non-urgent symptoms. For details visit [email or web address].   Also download the MyChart app! Go to the app store, search "MyChart", open the app, select [facility], and log in with your MyChart username and password.  Masks are optional in the cancer centers. If you would like for your care team to wear a mask while they are taking care of you, please let them know. You may have one support person who is at least 57 years old accompany you for your appointments.  Paclitaxel Nanoparticle Albumin-Bound Injection (Abraxane) What is this medication? NANOPARTICLE ALBUMIN-BOUND PACLITAXEL (Na no PAHR ti kuhl al BYOO muhn-bound PAK li TAX el) treats some types of cancer. It works by slowing down the growth of cancer cells. This medicine may be used for other purposes; ask your health care provider or pharmacist if   you have questions. COMMON BRAND NAME(S): Abraxane What should I tell my care team before I take this medication? They need to know if you have any of these conditions: Liver disease Low white blood cell levels An unusual or allergic reaction to  paclitaxel, albumin, other medications, foods, dyes, or preservatives If you or your partner are pregnant or trying to get pregnant Breast-feeding How should I use this medication? This medication is injected into a vein. It is given by your care team in a hospital or clinic setting. Talk to your care team about the use of this medication in children. Special care may be needed. Overdosage: If you think you have taken too much of this medicine contact a poison control center or emergency room at once. NOTE: This medicine is only for you. Do not share this medicine with others. What if I miss a dose? Keep appointments for follow-up doses. It is important not to miss your dose. Call your care team if you are unable to keep an appointment. What may interact with this medication? Other medications may affect the way this medication works. Talk with your care team about all of the medications you take. They may suggest changes to your treatment plan to lower the risk of side effects and to make sure your medications work as intended. This list may not describe all possible interactions. Give your health care provider a list of all the medicines, herbs, non-prescription drugs, or dietary supplements you use. Also tell them if you smoke, drink alcohol, or use illegal drugs. Some items may interact with your medicine. What should I watch for while using this medication? Your condition will be monitored carefully while you are receiving this medication. You may need blood work while taking this medication. This medication may make you feel generally unwell. This is not uncommon as chemotherapy can affect healthy cells as well as cancer cells. Report any side effects. Continue your course of treatment even though you feel ill unless your care team tells you to stop. This medication can cause serious allergic reactions. To reduce the risk, your care team may give you other medications to take before receiving  this one. Be sure to follow the directions from your care team. This medication may increase your risk of getting an infection. Call your care team for advice if you get a fever, chills, sore throat, or other symptoms of a cold or flu. Do not treat yourself. Try to avoid being around people who are sick. This medication may increase your risk to bruise or bleed. Call your care team if you notice any unusual bleeding. Be careful brushing or flossing your teeth or using a toothpick because you may get an infection or bleed more easily. If you have any dental work done, tell your dentist you are receiving this medication. Talk to your care team if you or your partner may be pregnant. Serious birth defects can occur if you take this medication during pregnancy and for 6 months after the last dose. You will need a negative pregnancy test before starting this medication. Contraception is recommended while taking this medication and for 6 months after the last dose. Your care team can help you find the option that works for you. If your partner can get pregnant, use a condom during sex while taking this medication and for 3 months after the last dose. Do not breastfeed while taking this medication and for 2 weeks after the last dose. This medication may cause infertility.   Talk to your care team if you are concerned about your fertility. What side effects may I notice from receiving this medication? Side effects that you should report to your care team as soon as possible: Allergic reactions--skin rash, itching, hives, swelling of the face, lips, tongue, or throat Dry cough, shortness of breath or trouble breathing Infection--fever, chills, cough, sore throat, wounds that don't heal, pain or trouble when passing urine, general feeling of discomfort or being unwell Low red blood cell level--unusual weakness or fatigue, dizziness, headache, trouble breathing Pain, tingling, or numbness in the hands or  feet Stomach pain, unusual weakness or fatigue, nausea, vomiting, diarrhea, or fever that lasts longer than expected Unusual bruising or bleeding Side effects that usually do not require medical attention (report to your care team if they continue or are bothersome): Diarrhea Fatigue Hair loss Loss of appetite Nausea Vomiting This list may not describe all possible side effects. Call your doctor for medical advice about side effects. You may report side effects to FDA at 1-800-FDA-1088. Where should I keep my medication? This medication is given in a hospital or clinic. It will not be stored at home. NOTE: This sheet is a summary. It may not cover all possible information. If you have questions about this medicine, talk to your doctor, pharmacist, or health care provider.  2023 Elsevier/Gold Standard (2021-12-07 00:00:00)  Gemcitabine Injection (Gemzar) What is this medication? GEMCITABINE (jem SYE ta been) treats some types of cancer. It works by slowing down the growth of cancer cells. This medicine may be used for other purposes; ask your health care provider or pharmacist if you have questions. COMMON BRAND NAME(S): Gemzar, Infugem What should I tell my care team before I take this medication? They need to know if you have any of these conditions: Blood disorders Infection Kidney disease Liver disease Lung or breathing disease, such as asthma or COPD Recent or ongoing radiation therapy An unusual or allergic reaction to gemcitabine, other medications, foods, dyes, or preservatives If you or your partner are pregnant or trying to get pregnant Breast-feeding How should I use this medication? This medication is injected into a vein. It is given by your care team in a hospital or clinic setting. Talk to your care team about the use of this medication in children. Special care may be needed. Overdosage: If you think you have taken too much of this medicine contact a poison control  center or emergency room at once. NOTE: This medicine is only for you. Do not share this medicine with others. What if I miss a dose? Keep appointments for follow-up doses. It is important not to miss your dose. Call your care team if you are unable to keep an appointment. What may interact with this medication? Interactions have not been studied. This list may not describe all possible interactions. Give your health care provider a list of all the medicines, herbs, non-prescription drugs, or dietary supplements you use. Also tell them if you smoke, drink alcohol, or use illegal drugs. Some items may interact with your medicine. What should I watch for while using this medication? Your condition will be monitored carefully while you are receiving this medication. This medication may make you feel generally unwell. This is not uncommon, as chemotherapy can affect healthy cells as well as cancer cells. Report any side effects. Continue your course of treatment even though you feel ill unless your care team tells you to stop. In some cases, you may be given additional medications   to help with side effects. Follow all directions for their use. This medication may increase your risk of getting an infection. Call your care team for advice if you get a fever, chills, sore throat, or other symptoms of a cold or flu. Do not treat yourself. Try to avoid being around people who are sick. This medication may increase your risk to bruise or bleed. Call your care team if you notice any unusual bleeding. Be careful brushing or flossing your teeth or using a toothpick because you may get an infection or bleed more easily. If you have any dental work done, tell your dentist you are receiving this medication. Avoid taking medications that contain aspirin, acetaminophen, ibuprofen, naproxen, or ketoprofen unless instructed by your care team. These medications may hide a fever. Talk to your care team if you or your  partner wish to become pregnant or think you might be pregnant. This medication can cause serious birth defects if taken during pregnancy and for 6 months after the last dose. A negative pregnancy test is required before starting this medication. A reliable form of contraception is recommended while taking this medication and for 6 months after the last dose. Talk to your care team about effective forms of contraception. Do not father a child while taking this medication and for 3 months after the last dose. Use a condom while having sex during this time period. Do not breastfeed while taking this medication and for at least 1 week after the last dose. This medication may cause infertility. Talk to your care team if you are concerned about your fertility. What side effects may I notice from receiving this medication? Side effects that you should report to your care team as soon as possible: Allergic reactions--skin rash, itching, hives, swelling of the face, lips, tongue, or throat Capillary leak syndrome--stomach or muscle pain, unusual weakness or fatigue, feeling faint or lightheaded, decrease in the amount of urine, swelling of the ankles, hands, or feet, trouble breathing Infection--fever, chills, cough, sore throat, wounds that don't heal, pain or trouble when passing urine, general feeling of discomfort or being unwell Liver injury--right upper belly pain, loss of appetite, nausea, light-colored stool, dark yellow or brown urine, yellowing skin or eyes, unusual weakness or fatigue Low red blood cell level--unusual weakness or fatigue, dizziness, headache, trouble breathing Lung injury--shortness of breath or trouble breathing, cough, spitting up blood, chest pain, fever Stomach pain, bloody diarrhea, pale skin, unusual weakness or fatigue, decrease in the amount of urine, which may be signs of hemolytic uremic syndrome Sudden and severe headache, confusion, change in vision, seizures, which may be  signs of posterior reversible encephalopathy syndrome (PRES) Unusual bruising or bleeding Side effects that usually do not require medical attention (report to your care team if they continue or are bothersome): Diarrhea Drowsiness Hair loss Nausea Pain, redness, or swelling with sores inside the mouth or throat Vomiting This list may not describe all possible side effects. Call your doctor for medical advice about side effects. You may report side effects to FDA at 1-800-FDA-1088. Where should I keep my medication? This medication is given in a hospital or clinic. It will not be stored at home. NOTE: This sheet is a summary. It may not cover all possible information. If you have questions about this medicine, talk to your doctor, pharmacist, or health care provider.  2023 Elsevier/Gold Standard (2021-12-07 00:00:00)  

## 2022-06-06 NOTE — Progress Notes (Signed)
Irinotecan prepared in NS 519m, Lot: [id number] Exp: 05/21/2023  PAcquanetta Belling RPH, BCPS, BCOP 05/15/2022 12:00

## 2022-06-12 ENCOUNTER — Encounter: Payer: Self-pay | Admitting: Hematology

## 2022-06-12 ENCOUNTER — Inpatient Hospital Stay: Payer: BC Managed Care – PPO

## 2022-06-12 ENCOUNTER — Other Ambulatory Visit: Payer: Self-pay

## 2022-06-12 ENCOUNTER — Inpatient Hospital Stay (HOSPITAL_BASED_OUTPATIENT_CLINIC_OR_DEPARTMENT_OTHER): Payer: BC Managed Care – PPO | Admitting: Hematology

## 2022-06-12 VITALS — BP 145/85 | HR 74 | Temp 98.6°F | Resp 18 | Ht 72.0 in | Wt 255.7 lb

## 2022-06-12 DIAGNOSIS — Z95828 Presence of other vascular implants and grafts: Secondary | ICD-10-CM

## 2022-06-12 DIAGNOSIS — Z5111 Encounter for antineoplastic chemotherapy: Secondary | ICD-10-CM | POA: Diagnosis not present

## 2022-06-12 DIAGNOSIS — C259 Malignant neoplasm of pancreas, unspecified: Secondary | ICD-10-CM | POA: Diagnosis not present

## 2022-06-12 DIAGNOSIS — C787 Secondary malignant neoplasm of liver and intrahepatic bile duct: Secondary | ICD-10-CM | POA: Diagnosis not present

## 2022-06-12 DIAGNOSIS — C250 Malignant neoplasm of head of pancreas: Secondary | ICD-10-CM | POA: Diagnosis not present

## 2022-06-12 LAB — CMP (CANCER CENTER ONLY)
ALT: 28 U/L (ref 0–44)
AST: 23 U/L (ref 15–41)
Albumin: 4 g/dL (ref 3.5–5.0)
Alkaline Phosphatase: 75 U/L (ref 38–126)
Anion gap: 7 (ref 5–15)
BUN: 14 mg/dL (ref 6–20)
CO2: 25 mmol/L (ref 22–32)
Calcium: 9 mg/dL (ref 8.9–10.3)
Chloride: 105 mmol/L (ref 98–111)
Creatinine: 0.8 mg/dL (ref 0.61–1.24)
GFR, Estimated: >60 mL/min (ref >60)
Glucose, Bld: 152 mg/dL — ABNORMAL HIGH (ref 70–99)
Potassium: 3.7 mmol/L (ref 3.5–5.1)
Sodium: 137 mmol/L (ref 135–145)
Total Bilirubin: 0.4 mg/dL (ref 0.3–1.2)
Total Protein: 6.9 g/dL (ref 6.5–8.1)

## 2022-06-12 LAB — CBC WITH DIFFERENTIAL (CANCER CENTER ONLY)
Abs Immature Granulocytes: 0.18 10*3/uL — ABNORMAL HIGH (ref 0.00–0.07)
Basophils Absolute: 0.1 10*3/uL (ref 0.0–0.1)
Basophils Relative: 1 %
Eosinophils Absolute: 0.1 10*3/uL (ref 0.0–0.5)
Eosinophils Relative: 2 %
HCT: 33.5 % — ABNORMAL LOW (ref 39.0–52.0)
Hemoglobin: 11.7 g/dL — ABNORMAL LOW (ref 13.0–17.0)
Immature Granulocytes: 3 %
Lymphocytes Relative: 37 %
Lymphs Abs: 2.6 10*3/uL (ref 0.7–4.0)
MCH: 31.9 pg (ref 26.0–34.0)
MCHC: 34.9 g/dL (ref 30.0–36.0)
MCV: 91.3 fL (ref 80.0–100.0)
Monocytes Absolute: 0.5 10*3/uL (ref 0.1–1.0)
Monocytes Relative: 7 %
Neutro Abs: 3.7 10*3/uL (ref 1.7–7.7)
Neutrophils Relative %: 50 %
Platelet Count: 180 10*3/uL (ref 150–400)
RBC: 3.67 MIL/uL — ABNORMAL LOW (ref 4.22–5.81)
RDW: 14.4 % (ref 11.5–15.5)
WBC Count: 7.1 10*3/uL (ref 4.0–10.5)
nRBC: 0.3 % — ABNORMAL HIGH (ref 0.0–0.2)

## 2022-06-12 MED ORDER — HEPARIN SOD (PORK) LOCK FLUSH 100 UNIT/ML IV SOLN
500.0000 [IU] | Freq: Once | INTRAVENOUS | Status: AC | PRN
  Administered 2022-06-12: 500 [IU]

## 2022-06-12 MED ORDER — SODIUM CHLORIDE 0.9 % IV SOLN
1000.0000 mg/m2 | Freq: Once | INTRAVENOUS | Status: AC
  Administered 2022-06-12: 2394 mg via INTRAVENOUS
  Filled 2022-06-12: qty 62.96

## 2022-06-12 MED ORDER — SODIUM CHLORIDE 0.9% FLUSH
10.0000 mL | Freq: Once | INTRAVENOUS | Status: AC
  Administered 2022-06-12: 10 mL

## 2022-06-12 MED ORDER — SODIUM CHLORIDE 0.9 % IV SOLN: Freq: Once | INTRAVENOUS | Status: AC

## 2022-06-12 MED ORDER — SODIUM CHLORIDE 0.9% FLUSH
10.0000 mL | INTRAVENOUS | Status: DC | PRN
  Administered 2022-06-12: 10 mL

## 2022-06-12 MED ORDER — PACLITAXEL PROTEIN-BOUND CHEMO INJECTION 100 MG
125.0000 mg/m2 | Freq: Once | INTRAVENOUS | Status: AC
  Administered 2022-06-12: 300 mg via INTRAVENOUS
  Filled 2022-06-12: qty 60

## 2022-06-12 MED ORDER — PROCHLORPERAZINE MALEATE 10 MG PO TABS: 10.0000 mg | ORAL_TABLET | Freq: Once | ORAL | Status: DC

## 2022-06-12 NOTE — Progress Notes (Signed)
[name]   [phone] [phone]) [phone]   Clinic Follow up Note   Patient Care Team: [name], MD as PCP - General (Family Medicine) Truitt Merle, MD as Consulting Physician (Oncology)  Date of Service:  06/12/2022  CHIEF COMPLAINT: f/u of metastatic pancreatic cancer  CURRENT THERAPY:  Second line Gemcitabine/Abraxane, days 1 + 8 q21d, starting 06/05/22  ASSESSMENT & PLAN:  Steve Mills is a 56 y.o. male with   1. Pancreatic Head Adenocarcinoma, metastatic to liver, stage IV, KRAS G12R (+), MMR normal  -incidental finding on lung cancer screening chest CT on 01/19/22. 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  -liver biopsy on 02/28/22 confirmed poorly differentiated adenocarcinoma, compatible with clinical suspicion of pancreatic adenocarcinoma. MMR normal, not a candidate for immunotherapy.  -FO showed KRAS mutation, and MSS, no immunotherapy or targeted therapy available. BRCA1/2 was negative, not a candidate for PARP inhibitor  -He began first-line palliative FOLFIRINOX on 03/13/22, but unfortunately did not respond well.  -he switched to second line gemcitabine/abraxane on 06/05/22. He tolerated well and has continued to work full time. -lab reviewed, hgb down slightly to 11.7. Adequate to proceed with treatment. -we discussed clinical trial options. He asked the role of Fenbendazole, which he had seen reports on. His wife also seen a clinical trial of Fenbendazole in cancer patients. He is interested in taking this medicine; I'm happy to check with pharmacy to ensure there is no known interaction. I will also look into literature of this medicine in cancer research.    PLAN: -proceed with C1D8 gemcitabine/abraxane today -lab, flush, and gem/abraxane in 2 and 3 weeks as scheduled -f/u in 2 weeks and day 1 of each cycle -will  check with pharmacy about Fenbendazole and cancer research data on this medicine.   No problem-specific Assessment & Plan notes found for this encounter.   SUMMARY OF ONCOLOGIC HISTORY: Oncology History  Pancreatic cancer metastasized to liver [facility])  02/28/2022 Cancer Staging   Staging form: Exocrine Pancreas, AJCC 8th Edition - Clinical stage from 02/28/2022: Stage IV (cT2, cN1, pM1) - Signed by Truitt Merle, MD on 03/02/2022 Stage prefix: Initial diagnosis   03/02/2022 Initial Diagnosis   Pancreatic cancer metastasized to liver ([place])   03/13/2022 - 04/13/2022 Chemotherapy   Patient is on Treatment Plan : PANCREAS Modified FOLFIRINOX q14d x 4 cycles     03/13/2022 - 05/17/2022 Chemotherapy   Patient is on Treatment Plan : PANCREAS Modified FOLFIRINOX q14d x 4 cycles     03/18/2022 Genetic Testing   Negative genetic testing on the CancerNext-Expanded+RNAinsight panel.  APC c.[id number] VUs identified.  The report date is March 18, 2022.  The CancerNext-Expanded gene panel offered by [facility] and includes sequencing and rearrangement analysis for the following 77 genes: AIP, ALK, APC*, ATM*, AXIN2, BAP1, BARD1, BLM, BMPR1A, BRCA1*, BRCA2*, BRIP1*, CDC73, CDH1*, CDK4, CDKN1B, CDKN2A, CHEK2*, CTNNA1, DICER1, FANCC, FH, FLCN, GALNT12, KIF1B, LZTR1, MAX, MEN1, MET, MLH1*, MSH2*, MSH3, MSH6*, MUTYH*, NBN, NF1*, NF2, NTHL1, PALB2*, PHOX2B, PMS2*, POT1, PRKAR1A, PTCH1, PTEN*, RAD51C*, RAD51D*, RB1, RECQL, RET, SDHA, SDHAF2, SDHB, SDHC, SDHD, SMAD4, SMARCA4, SMARCB1, SMARCE1, STK11, SUFU, TMEM127, TP53*, TSC1, TSC2, VHL and XRCC2 (sequencing and deletion/duplication); EGFR, EGLN1, HOXB13, KIT, MITF, PDGFRA, POLD1, and POLE (sequencing only); EPCAM and GREM1 (deletion/duplication only). DNA and RNA analyses performed for * genes.    05/25/2022 Progression   Rising CA 19-9, CT  CAP IMPRESSION: 1. Multiple new and enlarged hypodense, rim enhancing liver metastases. 2. Unchanged, ill-defined hypoenhancing mass  of the pancreatic uncinate, consistent with pancreatic adenocarcinoma 3. Unchanged small prominent portacaval and gastrohepatic ligament nodes, modestly suspicious for nodal metastases. No overtly enlarged lymph nodes in the abdomen or pelvis. 4. Prostatomegaly.    06/05/2022 -  Chemotherapy   Patient is on Treatment Plan : PANCREATIC Abraxane D1,8,15 + Gemcitabine D1,8,15 q28d        INTERVAL HISTORY:  Steve Mills is here for a follow up of metastatic pancreatic cancer. He was last seen by NP Lacie on 05/30/22. He presents to the clinic accompanied by his wife. He reports he is doing well overall. He notes he did experience some vomiting while accessing his port and when he left after infusion. He reports he otherwise tolerated treatment well and was able to continue working full time.   All other systems were reviewed with the patient and are negative.  MEDICAL HISTORY:  Past Medical History:  Diagnosis Date   Family history of adverse reaction to anesthesia    mother allergic to ether    Family history of breast cancer    Family history of colon cancer    Pneumonia    hx of 2014     SURGICAL HISTORY: Past Surgical History:  Procedure Laterality Date   APPENDECTOMY     CHOLECYSTECTOMY N/A 07/12/2018   Procedure: LAPAROSCOPIC CHOLECYSTECTOMY WITH INTRAOPERATIVE CHOLANGIOGRAM;  Surgeon: Johnathan Hausen, MD;  Location: [facility];  Service: General;  Laterality: N/A;   left wrist surgery      has plate in arm    NASAL POLYP SURGERY  2017   PORTACATH PLACEMENT N/A 03/10/2022   Procedure: INSERTION PORT-A-CATH;  Surgeon: Dwan Bolt, MD;  Location: [facility];  Service: General;  Laterality: N/A;   VENTRAL HERNIA REPAIR N/A 07/15/2014   Procedure: LAPAROSCOPIC REPAIR INCARCARTED UMBILICAL  VENTRAL HERNIA, ;  Surgeon: Fanny Skates, MD;  Location: [facility];  Service: General;  Laterality: N/A;  With MESH    I have reviewed the social history and family history with the patient and  they are unchanged from previous note.  ALLERGIES:  has No Known Allergies.  MEDICATIONS:  No current outpatient medications on file.   No current facility-administered medications for this visit.    PHYSICAL EXAMINATION: ECOG PERFORMANCE STATUS: 1 - Symptomatic but completely ambulatory  Vitals:   06/12/22 0935  BP: (!) 145/85  Pulse: 74  Resp: 18  Temp: 98.6 F (37 C)  SpO2: 97%   Wt Readings from Last 3 Encounters:  06/12/22 255 lb 11.2 oz (116 kg)  05/30/22 249 lb 4.8 oz (113.1 kg)  05/15/22 248 lb 11.2 oz (112.8 kg)     GENERAL:alert, no distress and comfortable SKIN: skin color normal, no rashes or significant lesions EYES: normal, Conjunctiva are pink and non-injected, sclera clear  NEURO: alert & oriented x 3 with fluent speech  LABORATORY DATA:  I have reviewed the data as listed    Latest Ref Rng & Units 06/12/2022    9:26 AM 06/05/2022    9:45 AM 05/30/2022    9:33 AM  CBC  WBC 4.0 - 10.5 K/uL 7.1  7.4  6.3   Hemoglobin 13.0 - 17.0 g/dL 11.7  12.8  12.9   Hematocrit 39.0 - 52.0 % 33.5  36.3  35.9   Platelets 150 - 400 K/uL 180  282  228  Latest Ref Rng & Units 06/12/2022    9:26 AM 06/05/2022    9:45 AM 05/30/2022    9:33 AM  CMP  Glucose 70 - 99 mg/dL 152  130  104   BUN 6 - 20 mg/dL 14  11  14    Creatinine 0.61 - 1.24 mg/dL 0.80  0.73  0.77   Sodium 135 - 145 mmol/L 137  138  138   Potassium 3.5 - 5.1 mmol/L 3.7  3.6  4.0   Chloride 98 - 111 mmol/L 105  105  105   CO2 22 - 32 mmol/L 25  28  28    Calcium 8.9 - 10.3 mg/dL 9.0  9.2  9.1   Total Protein 6.5 - 8.1 g/dL 6.9  6.7  7.1   Total Bilirubin 0.3 - 1.2 mg/dL 0.4  0.4  0.4   Alkaline Phos 38 - 126 U/L 75  83  84   AST 15 - 41 U/L 23  19  26    ALT 0 - 44 U/L 28  23  27        RADIOGRAPHIC STUDIES: I have personally reviewed the radiological images as listed and agreed with the findings in the report. No results found.    Orders Placed This Encounter  Procedures   CBC  with Differential ([facility] [facility] Only)    Standing Status:   Future    Standing Expiration Date:   07/18/2023   CMP ([place] only)    Standing Status:   Future    Standing Expiration Date:   07/18/2023   CBC with Differential ([place] Only)    Standing Status:   Future    Standing Expiration Date:   07/25/2023   CMP ([place] only)    Standing Status:   Future    Standing Expiration Date:   07/25/2023   All questions were answered. The patient knows to call the clinic with any problems, questions or concerns. No barriers to learning was detected. The total time spent in the appointment was 30 minutes.     Truitt Merle, MD 06/12/2022   I, Wilburn Mylar, am acting as scribe for Truitt Merle, MD.   I have reviewed the above documentation for accuracy and completeness, and I agree with the above.

## 2022-06-13 ENCOUNTER — Other Ambulatory Visit: Payer: Self-pay

## 2022-06-24 ENCOUNTER — Other Ambulatory Visit: Payer: Self-pay

## 2022-06-26 ENCOUNTER — Inpatient Hospital Stay: Payer: BC Managed Care – PPO | Attending: Hematology

## 2022-06-26 ENCOUNTER — Inpatient Hospital Stay: Payer: BC Managed Care – PPO

## 2022-06-26 ENCOUNTER — Inpatient Hospital Stay (HOSPITAL_BASED_OUTPATIENT_CLINIC_OR_DEPARTMENT_OTHER): Payer: BC Managed Care – PPO | Admitting: Hematology

## 2022-06-26 VITALS — BP 124/83 | HR 69 | Temp 98.6°F | Resp 18 | Ht 72.0 in | Wt 258.9 lb

## 2022-06-26 VITALS — BP 142/105 | HR 63 | Temp 98.3°F | Resp 18

## 2022-06-26 DIAGNOSIS — R7401 Elevation of levels of liver transaminase levels: Secondary | ICD-10-CM | POA: Insufficient documentation

## 2022-06-26 DIAGNOSIS — C787 Secondary malignant neoplasm of liver and intrahepatic bile duct: Secondary | ICD-10-CM

## 2022-06-26 DIAGNOSIS — Z95828 Presence of other vascular implants and grafts: Secondary | ICD-10-CM

## 2022-06-26 DIAGNOSIS — Z5111 Encounter for antineoplastic chemotherapy: Secondary | ICD-10-CM | POA: Insufficient documentation

## 2022-06-26 DIAGNOSIS — C250 Malignant neoplasm of head of pancreas: Secondary | ICD-10-CM | POA: Insufficient documentation

## 2022-06-26 DIAGNOSIS — C259 Malignant neoplasm of pancreas, unspecified: Secondary | ICD-10-CM | POA: Diagnosis not present

## 2022-06-26 LAB — CBC WITH DIFFERENTIAL (CANCER CENTER ONLY)
Abs Immature Granulocytes: 0.03 10*3/uL (ref 0.00–0.07)
Basophils Absolute: 0.1 10*3/uL (ref 0.0–0.1)
Basophils Relative: 1 %
Eosinophils Absolute: 0.5 10*3/uL (ref 0.0–0.5)
Eosinophils Relative: 6 %
HCT: 34.3 % — ABNORMAL LOW (ref 39.0–52.0)
Hemoglobin: 11.9 g/dL — ABNORMAL LOW (ref 13.0–17.0)
Immature Granulocytes: 0 %
Lymphocytes Relative: 36 %
Lymphs Abs: 2.8 10*3/uL (ref 0.7–4.0)
MCH: 32.2 pg (ref 26.0–34.0)
MCHC: 34.7 g/dL (ref 30.0–36.0)
MCV: 93 fL (ref 80.0–100.0)
Monocytes Absolute: 0.8 10*3/uL (ref 0.1–1.0)
Monocytes Relative: 11 %
Neutro Abs: 3.6 10*3/uL (ref 1.7–7.7)
Neutrophils Relative %: 46 %
Platelet Count: 448 10*3/uL — ABNORMAL HIGH (ref 150–400)
RBC: 3.69 MIL/uL — ABNORMAL LOW (ref 4.22–5.81)
RDW: 14.8 % (ref 11.5–15.5)
WBC Count: 7.9 10*3/uL (ref 4.0–10.5)
nRBC: 0 % (ref 0.0–0.2)

## 2022-06-26 LAB — CMP (CANCER CENTER ONLY)
ALT: 111 U/L — ABNORMAL HIGH (ref 0–44)
AST: 27 U/L (ref 15–41)
Albumin: 4.1 g/dL (ref 3.5–5.0)
Alkaline Phosphatase: 221 U/L — ABNORMAL HIGH (ref 38–126)
Anion gap: 7 (ref 5–15)
BUN: 12 mg/dL (ref 6–20)
CO2: 25 mmol/L (ref 22–32)
Calcium: 9.1 mg/dL (ref 8.9–10.3)
Chloride: 106 mmol/L (ref 98–111)
Creatinine: 0.79 mg/dL (ref 0.61–1.24)
GFR, Estimated: >60 mL/min (ref >60)
Glucose, Bld: 139 mg/dL — ABNORMAL HIGH (ref 70–99)
Potassium: 4 mmol/L (ref 3.5–5.1)
Sodium: 138 mmol/L (ref 135–145)
Total Bilirubin: 0.4 mg/dL (ref 0.3–1.2)
Total Protein: 6.8 g/dL (ref 6.5–8.1)

## 2022-06-26 MED ORDER — SODIUM CHLORIDE 0.9 % IV SOLN: Freq: Once | INTRAVENOUS | Status: AC

## 2022-06-26 MED ORDER — SODIUM CHLORIDE 0.9% FLUSH
10.0000 mL | INTRAVENOUS | Status: DC | PRN
  Administered 2022-06-26: 10 mL

## 2022-06-26 MED ORDER — PACLITAXEL PROTEIN-BOUND CHEMO INJECTION 100 MG
125.0000 mg/m2 | Freq: Once | INTRAVENOUS | Status: AC
  Administered 2022-06-26: 300 mg via INTRAVENOUS
  Filled 2022-06-26: qty 60

## 2022-06-26 MED ORDER — SODIUM CHLORIDE 0.9 % IV SOLN
1000.0000 mg/m2 | Freq: Once | INTRAVENOUS | Status: AC
  Administered 2022-06-26: 2394 mg via INTRAVENOUS
  Filled 2022-06-26: qty 62.96

## 2022-06-26 MED ORDER — HEPARIN SOD (PORK) LOCK FLUSH 100 UNIT/ML IV SOLN
500.0000 [IU] | Freq: Once | INTRAVENOUS | Status: AC | PRN
  Administered 2022-06-26: 500 [IU]

## 2022-06-26 MED ORDER — PROCHLORPERAZINE MALEATE 10 MG PO TABS
10.0000 mg | ORAL_TABLET | Freq: Once | ORAL | Status: AC
  Administered 2022-06-26: 10 mg via ORAL
  Filled 2022-06-26: qty 1

## 2022-06-26 MED ORDER — LIDOCAINE-PRILOCAINE 2.5-2.5 % EX CREA: 1.0000 | TOPICAL_CREAM | CUTANEOUS | 2 refills | Status: DC | PRN

## 2022-06-26 MED ORDER — SODIUM CHLORIDE 0.9% FLUSH
10.0000 mL | Freq: Once | INTRAVENOUS | Status: AC
  Administered 2022-06-26: 10 mL

## 2022-06-26 NOTE — Patient Instructions (Signed)
[facility]  Discharge Instructions: Thank you for choosing [facility] to provide your oncology and hematology care.   If you have a lab appointment with the [facility], please go directly to the [facility] and check in at the registration area.   Wear comfortable clothing and clothing appropriate for easy access to any Portacath or PICC line.   We strive to give you quality time with your provider. You may need to reschedule your appointment if you arrive late (15 or more minutes).  Arriving late affects you and other patients whose appointments are after yours.  Also, if you miss three or more appointments without notifying the office, you may be dismissed from the clinic at the provider's discretion.      For prescription refill requests, have your pharmacy contact our office and allow 72 hours for refills to be completed.    Today you received the following chemotherapy and/or immunotherapy agents Paclitaxel Nanoparticle Albumin-Bound Injection (Abraxane), Paclitaxel Nanoparticle Albumin-Bound Injection (Abraxane)   To help prevent nausea and vomiting after your treatment, we encourage you to take your nausea medication as directed.  BELOW ARE SYMPTOMS THAT SHOULD BE REPORTED IMMEDIATELY: *FEVER GREATER THAN 100.4 F (38 C) OR HIGHER *CHILLS OR SWEATING *NAUSEA AND VOMITING THAT IS NOT CONTROLLED WITH YOUR NAUSEA MEDICATION *UNUSUAL SHORTNESS OF BREATH *UNUSUAL BRUISING OR BLEEDING *URINARY PROBLEMS (pain or burning when urinating, or frequent urination) *BOWEL PROBLEMS (unusual diarrhea, constipation, pain near the anus) TENDERNESS IN MOUTH AND THROAT WITH OR WITHOUT PRESENCE OF ULCERS (sore throat, sores in mouth, or a toothache) UNUSUAL RASH, SWELLING OR PAIN  UNUSUAL VAGINAL DISCHARGE OR ITCHING   Items with * indicate a potential emergency and should be followed up as soon as possible or go to the Emergency Department if any  problems should occur.  Please show the CHEMOTHERAPY ALERT CARD or IMMUNOTHERAPY ALERT CARD at check-in to the Emergency Department and triage nurse.  Should you have questions after your visit or need to cancel or reschedule your appointment, please contact [facility]  Dept: [phone]  and follow the prompts.  Office hours are 8:00 a.m. to 4:30 p.m. Monday - Friday. Please note that voicemails left after 4:00 p.m. may not be returned until the following business day.  We are closed weekends and major holidays. You have access to a nurse at all times for urgent questions. Please call the main number to the clinic Dept: [phone] and follow the prompts.   For any non-urgent questions, you may also contact your provider using MyChart. We now offer e-Visits for anyone 16 and older to request care online for non-urgent symptoms. For details visit [email or web address].   Also download the MyChart app! Go to the app store, search "MyChart", open the app, select [facility], and log in with your MyChart username and password.  Masks are optional in the cancer centers. If you would like for your care team to wear a mask while they are taking care of you, please let them know. You may have one support person who is at least 57 years old accompany you for your appointments.  Paclitaxel Nanoparticle Albumin-Bound Injection (Abraxane) What is this medication? NANOPARTICLE ALBUMIN-BOUND PACLITAXEL (Na no PAHR ti kuhl al BYOO muhn-bound PAK li TAX el) treats some types of cancer. It works by slowing down the growth of cancer cells. This medicine may be used for other purposes; ask your health care provider or pharmacist if  you have questions. COMMON BRAND NAME(S): Abraxane What should I tell my care team before I take this medication? They need to know if you have any of these conditions: Liver disease Low white blood cell levels An unusual or allergic reaction to  paclitaxel, albumin, other medications, foods, dyes, or preservatives If you or your partner are pregnant or trying to get pregnant Breast-feeding How should I use this medication? This medication is injected into a vein. It is given by your care team in a hospital or clinic setting. Talk to your care team about the use of this medication in children. Special care may be needed. Overdosage: If you think you have taken too much of this medicine contact a poison control center or emergency room at once. NOTE: This medicine is only for you. Do not share this medicine with others. What if I miss a dose? Keep appointments for follow-up doses. It is important not to miss your dose. Call your care team if you are unable to keep an appointment. What may interact with this medication? Other medications may affect the way this medication works. Talk with your care team about all of the medications you take. They may suggest changes to your treatment plan to lower the risk of side effects and to make sure your medications work as intended. This list may not describe all possible interactions. Give your health care provider a list of all the medicines, herbs, non-prescription drugs, or dietary supplements you use. Also tell them if you smoke, drink alcohol, or use illegal drugs. Some items may interact with your medicine. What should I watch for while using this medication? Your condition will be monitored carefully while you are receiving this medication. You may need blood work while taking this medication. This medication may make you feel generally unwell. This is not uncommon as chemotherapy can affect healthy cells as well as cancer cells. Report any side effects. Continue your course of treatment even though you feel ill unless your care team tells you to stop. This medication can cause serious allergic reactions. To reduce the risk, your care team may give you other medications to take before receiving  this one. Be sure to follow the directions from your care team. This medication may increase your risk of getting an infection. Call your care team for advice if you get a fever, chills, sore throat, or other symptoms of a cold or flu. Do not treat yourself. Try to avoid being around people who are sick. This medication may increase your risk to bruise or bleed. Call your care team if you notice any unusual bleeding. Be careful brushing or flossing your teeth or using a toothpick because you may get an infection or bleed more easily. If you have any dental work done, tell your dentist you are receiving this medication. Talk to your care team if you or your partner may be pregnant. Serious birth defects can occur if you take this medication during pregnancy and for 6 months after the last dose. You will need a negative pregnancy test before starting this medication. Contraception is recommended while taking this medication and for 6 months after the last dose. Your care team can help you find the option that works for you. If your partner can get pregnant, use a condom during sex while taking this medication and for 3 months after the last dose. Do not breastfeed while taking this medication and for 2 weeks after the last dose. This medication may cause infertility.  Talk to your care team if you are concerned about your fertility. What side effects may I notice from receiving this medication? Side effects that you should report to your care team as soon as possible: Allergic reactions--skin rash, itching, hives, swelling of the face, lips, tongue, or throat Dry cough, shortness of breath or trouble breathing Infection--fever, chills, cough, sore throat, wounds that don't heal, pain or trouble when passing urine, general feeling of discomfort or being unwell Low red blood cell level--unusual weakness or fatigue, dizziness, headache, trouble breathing Pain, tingling, or numbness in the hands or  feet Stomach pain, unusual weakness or fatigue, nausea, vomiting, diarrhea, or fever that lasts longer than expected Unusual bruising or bleeding Side effects that usually do not require medical attention (report to your care team if they continue or are bothersome): Diarrhea Fatigue Hair loss Loss of appetite Nausea Vomiting This list may not describe all possible side effects. Call your doctor for medical advice about side effects. You may report side effects to FDA at 1-800-FDA-1088. Where should I keep my medication? This medication is given in a hospital or clinic. It will not be stored at home. NOTE: This sheet is a summary. It may not cover all possible information. If you have questions about this medicine, talk to your doctor, pharmacist, or health care provider.  2023 Elsevier/Gold Standard (2021-12-07 00:00:00)  Gemcitabine Injection (Gemzar) What is this medication? GEMCITABINE (jem SYE ta been) treats some types of cancer. It works by slowing down the growth of cancer cells. This medicine may be used for other purposes; ask your health care provider or pharmacist if you have questions. COMMON BRAND NAME(S): Gemzar, Infugem What should I tell my care team before I take this medication? They need to know if you have any of these conditions: Blood disorders Infection Kidney disease Liver disease Lung or breathing disease, such as asthma or COPD Recent or ongoing radiation therapy An unusual or allergic reaction to gemcitabine, other medications, foods, dyes, or preservatives If you or your partner are pregnant or trying to get pregnant Breast-feeding How should I use this medication? This medication is injected into a vein. It is given by your care team in a hospital or clinic setting. Talk to your care team about the use of this medication in children. Special care may be needed. Overdosage: If you think you have taken too much of this medicine contact a poison control  center or emergency room at once. NOTE: This medicine is only for you. Do not share this medicine with others. What if I miss a dose? Keep appointments for follow-up doses. It is important not to miss your dose. Call your care team if you are unable to keep an appointment. What may interact with this medication? Interactions have not been studied. This list may not describe all possible interactions. Give your health care provider a list of all the medicines, herbs, non-prescription drugs, or dietary supplements you use. Also tell them if you smoke, drink alcohol, or use illegal drugs. Some items may interact with your medicine. What should I watch for while using this medication? Your condition will be monitored carefully while you are receiving this medication. This medication may make you feel generally unwell. This is not uncommon, as chemotherapy can affect healthy cells as well as cancer cells. Report any side effects. Continue your course of treatment even though you feel ill unless your care team tells you to stop. In some cases, you may be given additional medications  to help with side effects. Follow all directions for their use. This medication may increase your risk of getting an infection. Call your care team for advice if you get a fever, chills, sore throat, or other symptoms of a cold or flu. Do not treat yourself. Try to avoid being around people who are sick. This medication may increase your risk to bruise or bleed. Call your care team if you notice any unusual bleeding. Be careful brushing or flossing your teeth or using a toothpick because you may get an infection or bleed more easily. If you have any dental work done, tell your dentist you are receiving this medication. Avoid taking medications that contain aspirin, acetaminophen, ibuprofen, naproxen, or ketoprofen unless instructed by your care team. These medications may hide a fever. Talk to your care team if you or your  partner wish to become pregnant or think you might be pregnant. This medication can cause serious birth defects if taken during pregnancy and for 6 months after the last dose. A negative pregnancy test is required before starting this medication. A reliable form of contraception is recommended while taking this medication and for 6 months after the last dose. Talk to your care team about effective forms of contraception. Do not father a child while taking this medication and for 3 months after the last dose. Use a condom while having sex during this time period. Do not breastfeed while taking this medication and for at least 1 week after the last dose. This medication may cause infertility. Talk to your care team if you are concerned about your fertility. What side effects may I notice from receiving this medication? Side effects that you should report to your care team as soon as possible: Allergic reactions--skin rash, itching, hives, swelling of the face, lips, tongue, or throat Capillary leak syndrome--stomach or muscle pain, unusual weakness or fatigue, feeling faint or lightheaded, decrease in the amount of urine, swelling of the ankles, hands, or feet, trouble breathing Infection--fever, chills, cough, sore throat, wounds that don't heal, pain or trouble when passing urine, general feeling of discomfort or being unwell Liver injury--right upper belly pain, loss of appetite, nausea, light-colored stool, dark yellow or brown urine, yellowing skin or eyes, unusual weakness or fatigue Low red blood cell level--unusual weakness or fatigue, dizziness, headache, trouble breathing Lung injury--shortness of breath or trouble breathing, cough, spitting up blood, chest pain, fever Stomach pain, bloody diarrhea, pale skin, unusual weakness or fatigue, decrease in the amount of urine, which may be signs of hemolytic uremic syndrome Sudden and severe headache, confusion, change in vision, seizures, which may be  signs of posterior reversible encephalopathy syndrome (PRES) Unusual bruising or bleeding Side effects that usually do not require medical attention (report to your care team if they continue or are bothersome): Diarrhea Drowsiness Hair loss Nausea Pain, redness, or swelling with sores inside the mouth or throat Vomiting This list may not describe all possible side effects. Call your doctor for medical advice about side effects. You may report side effects to FDA at 1-800-FDA-1088. Where should I keep my medication? This medication is given in a hospital or clinic. It will not be stored at home. NOTE: This sheet is a summary. It may not cover all possible information. If you have questions about this medicine, talk to your doctor, pharmacist, or health care provider.  2023 Elsevier/Gold Standard (2021-12-07 00:00:00)

## 2022-06-26 NOTE — Progress Notes (Signed)
[name]   Telephone:([phone] Fax:([phone]) [phone]   Clinic Follow up Note   Patient Care Team: [name], MD as PCP - General (Family Medicine) Truitt Merle, MD as Consulting Physician (Oncology)  Date of Service:  06/26/2022  CHIEF COMPLAINT: f/u of metastatic pancreatic cancer   CURRENT THERAPY:  Second line Gemcitabine/Abraxane, days 1 + 8 q21d, starting 06/05/22   ASSESSMENT & PLAN:  Steve Mills is a 56 y.o. male with   1. Pancreatic Head Adenocarcinoma, metastatic to liver, stage IV, KRAS G12R (+), MMR normal  -incidental finding on lung cancer screening chest CT on 01/19/22. 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  -liver biopsy on 02/28/22 confirmed poorly differentiated adenocarcinoma, compatible with clinical suspicion of pancreatic adenocarcinoma. MMR normal, not a candidate for immunotherapy.  -FO showed KRAS mutation, and MSS, no immunotherapy or targeted therapy available. BRCA1/2 was negative, not a candidate for PARP inhibitor  -He began first-line palliative FOLFIRINOX on 03/13/22, but unfortunately did not respond well. CA 19-9 also rose on treatment. -he switched to second line gemcitabine/abraxane on 06/05/22. He tolerates well and has continued to work full time. -lab reviewed, hgb stable at 11.9, plt up to 448k, ALT and alk phos newly elevated today. CA 19-9 is pending. Adequate to proceed with treatment. -we discussed the role of Fenbendazole, which he had seen reports on. I recently did some research into literature. I explained there is no clinical research done on humans. Self-reports from other people show very mixed results, with a few success stories but a few who died from side effects. I do not recommend taking with chemotherapy. We discussed we can reconsider this after he progressed on this chemo regimen, if he wants  to try   2. Mild transaminates   -likely secondary to chemo  -will monitor closely      PLAN: -proceed with C2D1 gemcitabine/abraxane today -lab, flush, and gem/abraxane in 1 and 2 weeks as scheduled -f/u with day 1 of each cycle   No problem-specific Assessment & Plan notes found for this encounter.   SUMMARY OF ONCOLOGIC HISTORY: Oncology History  Pancreatic cancer metastasized to liver [facility])  02/28/2022 Cancer Staging   Staging form: Exocrine Pancreas, AJCC 8th Edition - Clinical stage from 02/28/2022: Stage IV (cT2, cN1, pM1) - Signed by Truitt Merle, MD on 03/02/2022 Stage prefix: Initial diagnosis   03/02/2022 Initial Diagnosis   Pancreatic cancer metastasized to liver ([facility])   03/13/2022 - 04/13/2022 Chemotherapy   Patient is on Treatment Plan : PANCREAS Modified FOLFIRINOX q14d x 4 cycles     03/13/2022 - 05/17/2022 Chemotherapy   Patient is on Treatment Plan : PANCREAS Modified FOLFIRINOX q14d x 4 cycles     03/18/2022 Genetic Testing   Negative genetic testing on the CancerNext-Expanded+RNAinsight panel.  APC c.[id number] VUs identified.  The report date is March 18, 2022.  The CancerNext-Expanded gene panel offered by [facility] and includes sequencing and rearrangement analysis for the following 77 genes: AIP, ALK, APC*, ATM*, AXIN2, BAP1, BARD1, BLM, BMPR1A, BRCA1*, BRCA2*, BRIP1*, CDC73, CDH1*, CDK4, CDKN1B, CDKN2A, CHEK2*, CTNNA1, DICER1, FANCC, FH, FLCN, GALNT12, KIF1B, LZTR1, MAX, MEN1, MET, MLH1*, MSH2*, MSH3, MSH6*, MUTYH*, NBN, NF1*, NF2, NTHL1, PALB2*, PHOX2B, PMS2*, POT1, PRKAR1A, PTCH1, PTEN*, RAD51C*, RAD51D*, RB1, RECQL, RET, SDHA, SDHAF2, SDHB, SDHC, SDHD, SMAD4, SMARCA4, SMARCB1, SMARCE1, STK11, SUFU, TMEM127, TP53*, TSC1, TSC2, VHL and XRCC2 (sequencing and  deletion/duplication); EGFR, EGLN1, HOXB13, KIT, MITF, PDGFRA, POLD1, and POLE (sequencing only); EPCAM and GREM1 (deletion/duplication only). DNA and RNA analyses performed for * genes.    05/25/2022  Progression   Rising CA 19-9, CT CAP IMPRESSION: 1. Multiple new and enlarged hypodense, rim enhancing liver metastases. 2. Unchanged, ill-defined hypoenhancing mass of the pancreatic uncinate, consistent with pancreatic adenocarcinoma 3. Unchanged small prominent portacaval and gastrohepatic ligament nodes, modestly suspicious for nodal metastases. No overtly enlarged lymph nodes in the abdomen or pelvis. 4. Prostatomegaly.    06/05/2022 -  Chemotherapy   Patient is on Treatment Plan : PANCREATIC Abraxane D1,8,15 + Gemcitabine D1,8,15 q28d        INTERVAL HISTORY:  Steve Mills is here for a follow up of metastatic pancreatic cancer. He was last seen by me on 06/12/22. He presents to the clinic accompanied by his wife. He reports an instance of vomiting and several nose bleeds. He denies nausea. He explains he feels drainage from his sinuses down the back of his throat.   All other systems were reviewed with the patient and are negative.  MEDICAL HISTORY:  Past Medical History:  Diagnosis Date   Family history of adverse reaction to anesthesia    mother allergic to ether    Family history of breast cancer    Family history of colon cancer    Pneumonia    hx of 2014     SURGICAL HISTORY: Past Surgical History:  Procedure Laterality Date   APPENDECTOMY     CHOLECYSTECTOMY N/A 07/12/2018   Procedure: LAPAROSCOPIC CHOLECYSTECTOMY WITH INTRAOPERATIVE CHOLANGIOGRAM;  Surgeon: Johnathan Hausen, MD;  Location: [facility];  Service: General;  Laterality: N/A;   left wrist surgery      has plate in arm    NASAL POLYP SURGERY  2017   PORTACATH PLACEMENT N/A 03/10/2022   Procedure: INSERTION PORT-A-CATH;  Surgeon: Dwan Bolt, MD;  Location: [facility];  Service: General;  Laterality: N/A;   VENTRAL HERNIA REPAIR N/A 07/15/2014   Procedure: LAPAROSCOPIC REPAIR INCARCARTED UMBILICAL  VENTRAL HERNIA, ;  Surgeon: Fanny Skates, MD;  Location: [facility];  Service: General;  Laterality: N/A;   With MESH    I have reviewed the social history and family history with the patient and they are unchanged from previous note.  ALLERGIES:  has No Known Allergies.  MEDICATIONS:  Current Outpatient Medications  Medication Sig Dispense Refill   lidocaine-prilocaine (EMLA) cream Apply 1 Application topically as needed. 30 g 2   No current facility-administered medications for this visit.   Facility-Administered Medications Ordered in Other Visits  Medication Dose Route Frequency Provider Last Rate Last Admin   sodium chloride flush (NS) 0.9 % injection 10 mL  10 mL Intracatheter PRN Truitt Merle, MD   10 mL at 06/26/22 1247    PHYSICAL EXAMINATION: ECOG PERFORMANCE STATUS: 1 - Symptomatic but completely ambulatory  Vitals:   06/26/22 0953  BP: 124/83  Pulse: 69  Resp: 18  Temp: 98.6 F (37 C)  SpO2: 96%   Wt Readings from Last 3 Encounters:  06/26/22 258 lb 14.4 oz (117.4 kg)  06/12/22 255 lb 11.2 oz (116 kg)  05/30/22 249 lb 4.8 oz (113.1 kg)     GENERAL:alert, no distress and comfortable SKIN: skin color normal, no rashes or significant lesions EYES: normal, Conjunctiva are pink and non-injected, sclera clear  NEURO: alert & oriented x 3 with fluent speech  LABORATORY DATA:  I have reviewed the data as listed  Latest Ref Rng & Units 06/26/2022    9:15 AM 06/12/2022    9:26 AM 06/05/2022    9:45 AM  CBC  WBC 4.0 - 10.5 K/uL 7.9  7.1  7.4   Hemoglobin 13.0 - 17.0 g/dL 11.9  11.7  12.8   Hematocrit 39.0 - 52.0 % 34.3  33.5  36.3   Platelets 150 - 400 K/uL 448  180  282         Latest Ref Rng & Units 06/26/2022    9:15 AM 06/12/2022    9:26 AM 06/05/2022    9:45 AM  CMP  Glucose 70 - 99 mg/dL 139  152  130   BUN 6 - 20 mg/dL _0 Creatinine 0.61 - 1.24 mg/dL 0.79  0.80  0.73   Sodium 135 - 145 mmol/L 138  137  138   Potassium 3.5 - 5.1 mmol/L 4.0  3.7  3.6   Chloride 98 - 111 mmol/L 106  105  105   CO2 22 - 32 mmol/L _1 Calcium 8.9 -  10.3 mg/dL 9.1  9.0  9.2   Total Protein 6.5 - 8.1 g/dL 6.8  6.9  6.7   Total Bilirubin 0.3 - 1.2 mg/dL 0.4  0.4  0.4   Alkaline Phos 38 - 126 U/L 221  75  83   AST 15 - 41 U/L _2 ALT 0 - 44 U/L 111  28  23       RADIOGRAPHIC STUDIES: I have personally reviewed the radiological images as listed and agreed with the findings in the report. No results found.    No orders of the defined types were placed in this encounter.  All questions were answered. The patient knows to call the clinic with any problems, questions or concerns. No barriers to learning was detected. The total time spent in the appointment was 30 minutes.     Truitt Merle, MD 06/26/2022   I, Wilburn Mylar, am acting as scribe for Truitt Merle, MD.   I have reviewed the above documentation for accuracy and completeness, and I agree with the above.

## 2022-06-27 LAB — CANCER ANTIGEN 19-9: CA 19-9: 117 U/mL — ABNORMAL HIGH (ref 0–35)

## 2022-06-29 ENCOUNTER — Other Ambulatory Visit: Payer: Self-pay

## 2022-07-03 ENCOUNTER — Inpatient Hospital Stay: Payer: BC Managed Care – PPO

## 2022-07-03 ENCOUNTER — Other Ambulatory Visit: Payer: Self-pay

## 2022-07-03 VITALS — BP 131/83 | HR 66 | Temp 97.8°F | Resp 17 | Wt 258.6 lb

## 2022-07-03 DIAGNOSIS — Z5111 Encounter for antineoplastic chemotherapy: Secondary | ICD-10-CM | POA: Diagnosis not present

## 2022-07-03 DIAGNOSIS — C787 Secondary malignant neoplasm of liver and intrahepatic bile duct: Secondary | ICD-10-CM

## 2022-07-03 DIAGNOSIS — C250 Malignant neoplasm of head of pancreas: Secondary | ICD-10-CM | POA: Diagnosis not present

## 2022-07-03 DIAGNOSIS — Z95828 Presence of other vascular implants and grafts: Secondary | ICD-10-CM

## 2022-07-03 DIAGNOSIS — R7401 Elevation of levels of liver transaminase levels: Secondary | ICD-10-CM | POA: Diagnosis not present

## 2022-07-03 LAB — CBC WITH DIFFERENTIAL (CANCER CENTER ONLY)
Abs Immature Granulocytes: 0.51 10*3/uL — ABNORMAL HIGH (ref 0.00–0.07)
Basophils Absolute: 0.1 10*3/uL (ref 0.0–0.1)
Basophils Relative: 1 %
Eosinophils Absolute: 0.2 10*3/uL (ref 0.0–0.5)
Eosinophils Relative: 3 %
HCT: 31.7 % — ABNORMAL LOW (ref 39.0–52.0)
Hemoglobin: 11 g/dL — ABNORMAL LOW (ref 13.0–17.0)
Immature Granulocytes: 7 %
Lymphocytes Relative: 37 %
Lymphs Abs: 2.9 10*3/uL (ref 0.7–4.0)
MCH: 32.4 pg (ref 26.0–34.0)
MCHC: 34.7 g/dL (ref 30.0–36.0)
MCV: 93.2 fL (ref 80.0–100.0)
Monocytes Absolute: 0.9 10*3/uL (ref 0.1–1.0)
Monocytes Relative: 11 %
Neutro Abs: 3.1 10*3/uL (ref 1.7–7.7)
Neutrophils Relative %: 41 %
Platelet Count: 324 10*3/uL (ref 150–400)
RBC: 3.4 MIL/uL — ABNORMAL LOW (ref 4.22–5.81)
RDW: 13.7 % (ref 11.5–15.5)
Smear Review: NORMAL
WBC Count: 7.7 10*3/uL (ref 4.0–10.5)
nRBC: 0 % (ref 0.0–0.2)

## 2022-07-03 LAB — CMP (CANCER CENTER ONLY)
ALT: 34 U/L (ref 0–44)
AST: 21 U/L (ref 15–41)
Albumin: 4.1 g/dL (ref 3.5–5.0)
Alkaline Phosphatase: 121 U/L (ref 38–126)
Anion gap: 6 (ref 5–15)
BUN: 11 mg/dL (ref 6–20)
CO2: 28 mmol/L (ref 22–32)
Calcium: 9.1 mg/dL (ref 8.9–10.3)
Chloride: 104 mmol/L (ref 98–111)
Creatinine: 0.71 mg/dL (ref 0.61–1.24)
GFR, Estimated: >60 mL/min (ref >60)
Glucose, Bld: 112 mg/dL — ABNORMAL HIGH (ref 70–99)
Potassium: 3.9 mmol/L (ref 3.5–5.1)
Sodium: 138 mmol/L (ref 135–145)
Total Bilirubin: 0.3 mg/dL (ref 0.3–1.2)
Total Protein: 6.8 g/dL (ref 6.5–8.1)

## 2022-07-03 MED ORDER — PROCHLORPERAZINE MALEATE 10 MG PO TABS: 10.0000 mg | ORAL_TABLET | Freq: Once | ORAL | Status: DC

## 2022-07-03 MED ORDER — SODIUM CHLORIDE 0.9 % IV SOLN: Freq: Once | INTRAVENOUS | Status: AC

## 2022-07-03 MED ORDER — SODIUM CHLORIDE 0.9% FLUSH
10.0000 mL | INTRAVENOUS | Status: DC | PRN
  Administered 2022-07-03: 10 mL

## 2022-07-03 MED ORDER — SODIUM CHLORIDE 0.9 % IV SOLN
1000.0000 mg/m2 | Freq: Once | INTRAVENOUS | Status: AC
  Administered 2022-07-03: 2394 mg via INTRAVENOUS
  Filled 2022-07-03: qty 62.96

## 2022-07-03 MED ORDER — PACLITAXEL PROTEIN-BOUND CHEMO INJECTION 100 MG
125.0000 mg/m2 | Freq: Once | INTRAVENOUS | Status: AC
  Administered 2022-07-03: 300 mg via INTRAVENOUS
  Filled 2022-07-03: qty 60

## 2022-07-03 MED ORDER — SODIUM CHLORIDE 0.9% FLUSH
10.0000 mL | Freq: Once | INTRAVENOUS | Status: AC
  Administered 2022-07-03: 10 mL

## 2022-07-03 MED ORDER — HEPARIN SOD (PORK) LOCK FLUSH 100 UNIT/ML IV SOLN
500.0000 [IU] | Freq: Once | INTRAVENOUS | Status: AC | PRN
  Administered 2022-07-03: 500 [IU]

## 2022-07-03 NOTE — Progress Notes (Signed)
Per Dr Burr Medico, ok to treat with rash to upper body today.  Pt instructed to take benadryl at home  Also, pt took compazine at home prior to arrival.  Per Dr Burr Medico, ok to hold pre-med today

## 2022-07-10 ENCOUNTER — Other Ambulatory Visit: Payer: Self-pay | Admitting: Hematology

## 2022-07-10 DIAGNOSIS — C259 Malignant neoplasm of pancreas, unspecified: Secondary | ICD-10-CM

## 2022-07-17 ENCOUNTER — Encounter: Payer: Self-pay | Admitting: Hematology

## 2022-07-17 ENCOUNTER — Inpatient Hospital Stay (HOSPITAL_BASED_OUTPATIENT_CLINIC_OR_DEPARTMENT_OTHER): Payer: BC Managed Care – PPO | Admitting: Hematology

## 2022-07-17 ENCOUNTER — Other Ambulatory Visit: Payer: Self-pay

## 2022-07-17 ENCOUNTER — Inpatient Hospital Stay: Payer: BC Managed Care – PPO

## 2022-07-17 DIAGNOSIS — Z5111 Encounter for antineoplastic chemotherapy: Secondary | ICD-10-CM | POA: Diagnosis not present

## 2022-07-17 DIAGNOSIS — C787 Secondary malignant neoplasm of liver and intrahepatic bile duct: Secondary | ICD-10-CM

## 2022-07-17 DIAGNOSIS — C259 Malignant neoplasm of pancreas, unspecified: Secondary | ICD-10-CM

## 2022-07-17 DIAGNOSIS — C250 Malignant neoplasm of head of pancreas: Secondary | ICD-10-CM | POA: Diagnosis not present

## 2022-07-17 DIAGNOSIS — R7401 Elevation of levels of liver transaminase levels: Secondary | ICD-10-CM | POA: Diagnosis not present

## 2022-07-17 DIAGNOSIS — Z95828 Presence of other vascular implants and grafts: Secondary | ICD-10-CM

## 2022-07-17 LAB — CBC WITH DIFFERENTIAL (CANCER CENTER ONLY)
Abs Immature Granulocytes: 0.06 10*3/uL (ref 0.00–0.07)
Basophils Absolute: 0.1 10*3/uL (ref 0.0–0.1)
Basophils Relative: 1 %
Eosinophils Absolute: 0.7 10*3/uL — ABNORMAL HIGH (ref 0.0–0.5)
Eosinophils Relative: 8 %
HCT: 35 % — ABNORMAL LOW (ref 39.0–52.0)
Hemoglobin: 11.8 g/dL — ABNORMAL LOW (ref 13.0–17.0)
Immature Granulocytes: 1 %
Lymphocytes Relative: 25 %
Lymphs Abs: 2.3 10*3/uL (ref 0.7–4.0)
MCH: 31.6 pg (ref 26.0–34.0)
MCHC: 33.7 g/dL (ref 30.0–36.0)
MCV: 93.8 fL (ref 80.0–100.0)
Monocytes Absolute: 1 10*3/uL (ref 0.1–1.0)
Monocytes Relative: 11 %
Neutro Abs: 5 10*3/uL (ref 1.7–7.7)
Neutrophils Relative %: 54 %
Platelet Count: 364 10*3/uL (ref 150–400)
RBC: 3.73 MIL/uL — ABNORMAL LOW (ref 4.22–5.81)
RDW: 14.7 % (ref 11.5–15.5)
WBC Count: 9.3 10*3/uL (ref 4.0–10.5)
nRBC: 0 % (ref 0.0–0.2)

## 2022-07-17 LAB — CMP (CANCER CENTER ONLY)
ALT: 34 U/L (ref 0–44)
AST: 24 U/L (ref 15–41)
Albumin: 4.3 g/dL (ref 3.5–5.0)
Alkaline Phosphatase: 104 U/L (ref 38–126)
Anion gap: 6 (ref 5–15)
BUN: 13 mg/dL (ref 6–20)
CO2: 27 mmol/L (ref 22–32)
Calcium: 9.6 mg/dL (ref 8.9–10.3)
Chloride: 106 mmol/L (ref 98–111)
Creatinine: 0.89 mg/dL (ref 0.61–1.24)
GFR, Estimated: >60 mL/min (ref >60)
Glucose, Bld: 131 mg/dL — ABNORMAL HIGH (ref 70–99)
Potassium: 3.9 mmol/L (ref 3.5–5.1)
Sodium: 139 mmol/L (ref 135–145)
Total Bilirubin: 0.4 mg/dL (ref 0.3–1.2)
Total Protein: 6.9 g/dL (ref 6.5–8.1)

## 2022-07-17 MED ORDER — SODIUM CHLORIDE 0.9 % IV SOLN: Freq: Once | INTRAVENOUS | Status: AC

## 2022-07-17 MED ORDER — SODIUM CHLORIDE 0.9% FLUSH
10.0000 mL | Freq: Once | INTRAVENOUS | Status: AC
  Administered 2022-07-17: 10 mL

## 2022-07-17 MED ORDER — PROCHLORPERAZINE MALEATE 10 MG PO TABS
10.0000 mg | ORAL_TABLET | Freq: Once | ORAL | Status: AC
  Administered 2022-07-17: 10 mg via ORAL
  Filled 2022-07-17: qty 1

## 2022-07-17 MED ORDER — PACLITAXEL PROTEIN-BOUND CHEMO INJECTION 100 MG
125.0000 mg/m2 | Freq: Once | INTRAVENOUS | Status: AC
  Administered 2022-07-17: 300 mg via INTRAVENOUS
  Filled 2022-07-17: qty 60

## 2022-07-17 MED ORDER — SODIUM CHLORIDE 0.9 % IV SOLN
1000.0000 mg/m2 | Freq: Once | INTRAVENOUS | Status: AC
  Administered 2022-07-17: 2394 mg via INTRAVENOUS
  Filled 2022-07-17: qty 62.96

## 2022-07-17 MED ORDER — SODIUM CHLORIDE 0.9% FLUSH
10.0000 mL | INTRAVENOUS | Status: DC | PRN
  Administered 2022-07-17: 10 mL

## 2022-07-17 MED ORDER — SODIUM CHLORIDE 0.9 % IV SOLN: Freq: Once | INTRAVENOUS | Status: DC

## 2022-07-17 MED ORDER — HEPARIN SOD (PORK) LOCK FLUSH 100 UNIT/ML IV SOLN
500.0000 [IU] | Freq: Once | INTRAVENOUS | Status: AC | PRN
  Administered 2022-07-17: 500 [IU]

## 2022-07-17 NOTE — Progress Notes (Signed)
[place]   Telephone:(336) [phone] Fax:(336) [phone]   Clinic Follow up Note   Patient Care Team: [name], MD as PCP - General (Family Medicine) Truitt Merle, MD as Consulting Physician (Oncology)  Date of Service:  07/17/2022  CHIEF COMPLAINT: f/u of metastatic pancreatic cancer  CURRENT THERAPY:  Second line Gemcitabine/Abraxane, days 1 + 8 q21d, starting 06/05/22   ASSESSMENT:  Steve Mills is a 56 y.o. male with   1. Pancreatic Head Adenocarcinoma, metastatic to liver, stage IV, KRAS G12R (+), MMR normal  -incidental finding on lung cancer screening chest CT on 01/19/22. 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  -liver biopsy on 02/28/22 confirmed poorly differentiated adenocarcinoma, compatible with clinical suspicion of pancreatic adenocarcinoma. MMR normal, not a candidate for immunotherapy.  -FO showed KRAS mutation, and MSS, no immunotherapy or targeted therapy available. BRCA1/2 was negative, not a candidate for PARP inhibitor  -He began first-line palliative FOLFIRINOX on 03/13/22, but unfortunately did not respond well. CA 19-9 also rose on treatment. -he switched to second line gemcitabine/abraxane on 06/05/22. He tolerates moderate well and has continued to work full time. CA 19-9 has dropped on treatment.   2. Mild transaminitis    -likely secondary to chemo  -will monitor closely      PLAN: -labs reviewed, overall WNL, hgb stable at 11.8. -proceed with C3D1 gemcitabine/abraxane today -lab, flush, and gem/abraxane in 1 and 2 weeks as scheduled  -he requests a bed if possible due to hip pain -f/u with day 1 of each cycle  -will order restaging scan at next visit   SUMMARY OF ONCOLOGIC HISTORY: Oncology History  Pancreatic cancer metastasized to liver ([place])  02/28/2022 Cancer Staging   Staging form: Exocrine Pancreas, AJCC  8th Edition - Clinical stage from 02/28/2022: Stage IV (cT2, cN1, pM1) - Signed by Truitt Merle, MD on 03/02/2022 Stage prefix: Initial diagnosis   03/02/2022 Initial Diagnosis   Pancreatic cancer metastasized to liver ([place])   03/13/2022 - 04/13/2022 Chemotherapy   Patient is on Treatment Plan : PANCREAS Modified FOLFIRINOX q14d x 4 cycles     03/13/2022 - 05/17/2022 Chemotherapy   Patient is on Treatment Plan : PANCREAS Modified FOLFIRINOX q14d x 4 cycles     03/18/2022 Genetic Testing   Negative genetic testing on the CancerNext-Expanded+RNAinsight panel.  APC c.4847A>T VUs identified.  The report date is March 18, 2022.  The CancerNext-Expanded gene panel offered by [facility] and includes sequencing and rearrangement analysis for the following 77 genes: AIP, ALK, APC*, ATM*, AXIN2, BAP1, BARD1, BLM, BMPR1A, BRCA1*, BRCA2*, BRIP1*, CDC73, CDH1*, CDK4, CDKN1B, CDKN2A, CHEK2*, CTNNA1, DICER1, FANCC, FH, FLCN, GALNT12, KIF1B, LZTR1, MAX, MEN1, MET, MLH1*, MSH2*, MSH3, MSH6*, MUTYH*, NBN, NF1*, NF2, NTHL1, PALB2*, PHOX2B, PMS2*, POT1, PRKAR1A, PTCH1, PTEN*, RAD51C*, RAD51D*, RB1, RECQL, RET, SDHA, SDHAF2, SDHB, SDHC, SDHD, SMAD4, SMARCA4, SMARCB1, SMARCE1, STK11, SUFU, TMEM127, TP53*, TSC1, TSC2, VHL and XRCC2 (sequencing and deletion/duplication); EGFR, EGLN1, HOXB13, KIT, MITF, PDGFRA, POLD1, and POLE (sequencing only); EPCAM and GREM1 (deletion/duplication only). DNA and RNA analyses performed for * genes.    05/25/2022 Progression   Rising CA 19-9, CT CAP IMPRESSION: 1. Multiple new and enlarged hypodense, rim enhancing liver metastases. 2. Unchanged, ill-defined hypoenhancing mass of the pancreatic uncinate, consistent with pancreatic adenocarcinoma 3. Unchanged small prominent portacaval and gastrohepatic ligament nodes, modestly suspicious for nodal metastases. No overtly enlarged lymph nodes in  the abdomen or pelvis. 4. Prostatomegaly.    06/05/2022 -  Chemotherapy   Patient is on Treatment  Plan : PANCREATIC Abraxane D1,8,15 + Gemcitabine D1,8,15 q28d        INTERVAL HISTORY:  Steve Mills is here for a follow up of metastatic pancreatic cancer. He was last seen by me on 06/26/22. He presents to the clinic accompanied by his wife. He reports fatigue but continues to work. He notes nausea with vomiting following flush, after infusion, and the evening after infusion. He explains it's not stomach contents but more a gagging feeling from his sinuses draining.   All other systems were reviewed with the patient and are negative.  MEDICAL HISTORY:  Past Medical History:  Diagnosis Date   Family history of adverse reaction to anesthesia    mother allergic to ether    Family history of breast cancer    Family history of colon cancer    Pneumonia    hx of 2014     SURGICAL HISTORY: Past Surgical History:  Procedure Laterality Date   APPENDECTOMY     CHOLECYSTECTOMY N/A 07/12/2018   Procedure: LAPAROSCOPIC CHOLECYSTECTOMY WITH INTRAOPERATIVE CHOLANGIOGRAM;  Surgeon: Johnathan Hausen, MD;  Location: [facility];  Service: General;  Laterality: N/A;   left wrist surgery      has plate in arm    NASAL POLYP SURGERY  2017   PORTACATH PLACEMENT N/A 03/10/2022   Procedure: INSERTION PORT-A-CATH;  Surgeon: Dwan Bolt, MD;  Location: [place];  Service: General;  Laterality: N/A;   VENTRAL HERNIA REPAIR N/A 07/15/2014   Procedure: LAPAROSCOPIC REPAIR INCARCARTED UMBILICAL  VENTRAL HERNIA, ;  Surgeon: Fanny Skates, MD;  Location: [facility];  Service: General;  Laterality: N/A;  With MESH    I have reviewed the social history and family history with the patient and they are unchanged from previous note.  ALLERGIES:  has No Known Allergies.  MEDICATIONS:  Current Outpatient Medications  Medication Sig Dispense Refill   lidocaine-prilocaine (EMLA) cream Apply 1 Application topically as needed. 30 g 2   No current facility-administered medications for this visit.    PHYSICAL  EXAMINATION: ECOG PERFORMANCE STATUS: 1 - Symptomatic but completely ambulatory  Vitals:   07/17/22 0943  BP: (!) 140/95  Pulse: 81  Resp: 18  Temp: 98.5 F (36.9 C)  SpO2: 95%   Wt Readings from Last 3 Encounters:  07/17/22 261 lb 9.6 oz (118.7 kg)  07/03/22 258 lb 10 oz (117.3 kg)  06/26/22 258 lb 14.4 oz (117.4 kg)     GENERAL:alert, no distress and comfortable SKIN: skin color normal, no rashes or significant lesions EYES: normal, Conjunctiva are pink and non-injected, sclera clear  NEURO: alert & oriented x 3 with fluent speech  LABORATORY DATA:  I have reviewed the data as listed    Latest Ref Rng & Units 07/17/2022    9:15 AM 07/03/2022    9:23 AM 06/26/2022    9:15 AM  CBC  WBC 4.0 - 10.5 K/uL 9.3  7.7  7.9   Hemoglobin 13.0 - 17.0 g/dL 11.8  11.0  11.9   Hematocrit 39.0 - 52.0 % 35.0  31.7  34.3   Platelets 150 - 400 K/uL 364  324  448         Latest Ref Rng & Units 07/17/2022    9:15 AM 07/03/2022    9:23 AM 06/26/2022    9:15 AM  CMP  Glucose 70 - 99 mg/dL 131  112  139   BUN 6 - 20 mg/dL _0 Creatinine 0.61 - 1.24 mg/dL 0.89  0.71  0.79   Sodium 135 - 145 mmol/L 139  138  138   Potassium 3.5 - 5.1 mmol/L 3.9  3.9  4.0   Chloride 98 - 111 mmol/L 106  104  106   CO2 22 - 32 mmol/L _1 Calcium 8.9 - 10.3 mg/dL 9.6  9.1  9.1   Total Protein 6.5 - 8.1 g/dL 6.9  6.8  6.8   Total Bilirubin 0.3 - 1.2 mg/dL 0.4  0.3  0.4   Alkaline Phos 38 - 126 U/L 104  121  221   AST 15 - 41 U/L _2 ALT 0 - 44 U/L 34  34  111       RADIOGRAPHIC STUDIES: I have personally reviewed the radiological images as listed and agreed with the findings in the report. No results found.    No orders of the defined types were placed in this encounter.  All questions were answered. The patient knows to call the clinic with any problems, questions or concerns. No barriers to learning was detected. The total time spent in the appointment was 30  minutes.     Truitt Merle, MD 07/17/2022   I, Wilburn Mylar, am acting as scribe for Truitt Merle, MD.   I have reviewed the above documentation for accuracy and completeness, and I agree with the above.

## 2022-07-18 ENCOUNTER — Telehealth: Payer: Self-pay | Admitting: Hematology

## 2022-07-18 NOTE — Telephone Encounter (Signed)
Called patient to inform of upcoming appointments. Left voicemail

## 2022-07-19 ENCOUNTER — Other Ambulatory Visit: Payer: Self-pay

## 2022-07-20 ENCOUNTER — Other Ambulatory Visit: Payer: Self-pay

## 2022-07-24 ENCOUNTER — Inpatient Hospital Stay: Payer: BC Managed Care – PPO | Attending: Hematology

## 2022-07-24 ENCOUNTER — Inpatient Hospital Stay: Payer: BC Managed Care – PPO

## 2022-07-24 ENCOUNTER — Other Ambulatory Visit: Payer: Self-pay

## 2022-07-24 DIAGNOSIS — Z5111 Encounter for antineoplastic chemotherapy: Secondary | ICD-10-CM | POA: Insufficient documentation

## 2022-07-24 DIAGNOSIS — C250 Malignant neoplasm of head of pancreas: Secondary | ICD-10-CM | POA: Insufficient documentation

## 2022-07-24 DIAGNOSIS — C259 Malignant neoplasm of pancreas, unspecified: Secondary | ICD-10-CM

## 2022-07-24 DIAGNOSIS — C787 Secondary malignant neoplasm of liver and intrahepatic bile duct: Secondary | ICD-10-CM | POA: Diagnosis not present

## 2022-07-24 DIAGNOSIS — G629 Polyneuropathy, unspecified: Secondary | ICD-10-CM | POA: Insufficient documentation

## 2022-07-24 DIAGNOSIS — R0602 Shortness of breath: Secondary | ICD-10-CM | POA: Diagnosis not present

## 2022-07-24 DIAGNOSIS — R112 Nausea with vomiting, unspecified: Secondary | ICD-10-CM | POA: Diagnosis not present

## 2022-07-24 LAB — CBC WITH DIFFERENTIAL (CANCER CENTER ONLY)
Abs Immature Granulocytes: 0.33 10*3/uL — ABNORMAL HIGH (ref 0.00–0.07)
Basophils Absolute: 0.1 10*3/uL (ref 0.0–0.1)
Basophils Relative: 1 %
Eosinophils Absolute: 0.2 10*3/uL (ref 0.0–0.5)
Eosinophils Relative: 3 %
HCT: 31.5 % — ABNORMAL LOW (ref 39.0–52.0)
Hemoglobin: 10.7 g/dL — ABNORMAL LOW (ref 13.0–17.0)
Immature Granulocytes: 5 %
Lymphocytes Relative: 37 %
Lymphs Abs: 2.6 10*3/uL (ref 0.7–4.0)
MCH: 31.3 pg (ref 26.0–34.0)
MCHC: 34 g/dL (ref 30.0–36.0)
MCV: 92.1 fL (ref 80.0–100.0)
Monocytes Absolute: 0.9 10*3/uL (ref 0.1–1.0)
Monocytes Relative: 12 %
Neutro Abs: 2.9 10*3/uL (ref 1.7–7.7)
Neutrophils Relative %: 42 %
Platelet Count: 323 10*3/uL (ref 150–400)
RBC: 3.42 MIL/uL — ABNORMAL LOW (ref 4.22–5.81)
RDW: 13.9 % (ref 11.5–15.5)
WBC Count: 7 10*3/uL (ref 4.0–10.5)
nRBC: 0.3 % — ABNORMAL HIGH (ref 0.0–0.2)

## 2022-07-24 LAB — CMP (CANCER CENTER ONLY)
ALT: 44 U/L (ref 0–44)
AST: 31 U/L (ref 15–41)
Albumin: 4.1 g/dL (ref 3.5–5.0)
Alkaline Phosphatase: 76 U/L (ref 38–126)
Anion gap: 6 (ref 5–15)
BUN: 12 mg/dL (ref 6–20)
CO2: 26 mmol/L (ref 22–32)
Calcium: 9.3 mg/dL (ref 8.9–10.3)
Chloride: 105 mmol/L (ref 98–111)
Creatinine: 0.77 mg/dL (ref 0.61–1.24)
GFR, Estimated: >60 mL/min (ref >60)
Glucose, Bld: 140 mg/dL — ABNORMAL HIGH (ref 70–99)
Potassium: 4 mmol/L (ref 3.5–5.1)
Sodium: 137 mmol/L (ref 135–145)
Total Bilirubin: 0.3 mg/dL (ref 0.3–1.2)
Total Protein: 6.8 g/dL (ref 6.5–8.1)

## 2022-07-24 MED ORDER — PROCHLORPERAZINE MALEATE 10 MG PO TABS
10.0000 mg | ORAL_TABLET | Freq: Once | ORAL | Status: AC
  Administered 2022-07-24: 10 mg via ORAL
  Filled 2022-07-24: qty 1

## 2022-07-24 MED ORDER — PACLITAXEL PROTEIN-BOUND CHEMO INJECTION 100 MG
125.0000 mg/m2 | Freq: Once | INTRAVENOUS | Status: AC
  Administered 2022-07-24: 300 mg via INTRAVENOUS
  Filled 2022-07-24: qty 60

## 2022-07-24 MED ORDER — SODIUM CHLORIDE 0.9 % IV SOLN: Freq: Once | INTRAVENOUS | Status: AC

## 2022-07-24 MED ORDER — SODIUM CHLORIDE 0.9 % IV SOLN
1000.0000 mg/m2 | Freq: Once | INTRAVENOUS | Status: AC
  Administered 2022-07-24: 2394 mg via INTRAVENOUS
  Filled 2022-07-24: qty 62.96

## 2022-07-24 MED ORDER — HEPARIN SOD (PORK) LOCK FLUSH 100 UNIT/ML IV SOLN: 500.0000 [IU] | Freq: Once | INTRAVENOUS | Status: DC | PRN

## 2022-07-24 MED ORDER — SODIUM CHLORIDE 0.9% FLUSH: 10.0000 mL | INTRAVENOUS | Status: DC | PRN

## 2022-08-06 NOTE — Assessment & Plan Note (Signed)
metastatic to liver, stage IV, KRAS G12R (+), MMR normal  -incidental finding on lung cancer screening chest CT on 01/19/22. 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  -He began first-line palliative FOLFIRINOX on 03/13/22, but unfortunately did not respond well. CA 19-9 also rose on treatment. -he switched to second line gemcitabine/abraxane on 06/05/22. He tolerates moderate well and has continued to work full time. CA 19-9 has dropped on treatment.

## 2022-08-07 ENCOUNTER — Inpatient Hospital Stay: Payer: BC Managed Care – PPO

## 2022-08-07 ENCOUNTER — Other Ambulatory Visit: Payer: Self-pay

## 2022-08-07 ENCOUNTER — Encounter: Payer: Self-pay | Admitting: Hematology

## 2022-08-07 ENCOUNTER — Inpatient Hospital Stay (HOSPITAL_BASED_OUTPATIENT_CLINIC_OR_DEPARTMENT_OTHER): Payer: BC Managed Care – PPO | Admitting: Hematology

## 2022-08-07 VITALS — BP 137/79 | HR 79 | Temp 98.8°F | Resp 18 | Ht 72.0 in | Wt 257.4 lb

## 2022-08-07 DIAGNOSIS — C259 Malignant neoplasm of pancreas, unspecified: Secondary | ICD-10-CM

## 2022-08-07 DIAGNOSIS — Z95828 Presence of other vascular implants and grafts: Secondary | ICD-10-CM

## 2022-08-07 DIAGNOSIS — C787 Secondary malignant neoplasm of liver and intrahepatic bile duct: Secondary | ICD-10-CM

## 2022-08-07 DIAGNOSIS — G629 Polyneuropathy, unspecified: Secondary | ICD-10-CM | POA: Diagnosis not present

## 2022-08-07 DIAGNOSIS — C250 Malignant neoplasm of head of pancreas: Secondary | ICD-10-CM | POA: Diagnosis not present

## 2022-08-07 DIAGNOSIS — Z5111 Encounter for antineoplastic chemotherapy: Secondary | ICD-10-CM | POA: Diagnosis not present

## 2022-08-07 DIAGNOSIS — R0602 Shortness of breath: Secondary | ICD-10-CM | POA: Diagnosis not present

## 2022-08-07 DIAGNOSIS — R112 Nausea with vomiting, unspecified: Secondary | ICD-10-CM | POA: Diagnosis not present

## 2022-08-07 LAB — CBC WITH DIFFERENTIAL (CANCER CENTER ONLY)
Abs Immature Granulocytes: 0.04 10*3/uL (ref 0.00–0.07)
Basophils Absolute: 0.1 10*3/uL (ref 0.0–0.1)
Basophils Relative: 1 %
Eosinophils Absolute: 0.5 10*3/uL (ref 0.0–0.5)
Eosinophils Relative: 5 %
HCT: 34.9 % — ABNORMAL LOW (ref 39.0–52.0)
Hemoglobin: 11.9 g/dL — ABNORMAL LOW (ref 13.0–17.0)
Immature Granulocytes: 0 %
Lymphocytes Relative: 25 %
Lymphs Abs: 2.4 10*3/uL (ref 0.7–4.0)
MCH: 31.3 pg (ref 26.0–34.0)
MCHC: 34.1 g/dL (ref 30.0–36.0)
MCV: 91.8 fL (ref 80.0–100.0)
Monocytes Absolute: 1 10*3/uL (ref 0.1–1.0)
Monocytes Relative: 10 %
Neutro Abs: 5.4 10*3/uL (ref 1.7–7.7)
Neutrophils Relative %: 59 %
Platelet Count: 387 10*3/uL (ref 150–400)
RBC: 3.8 MIL/uL — ABNORMAL LOW (ref 4.22–5.81)
RDW: 14.7 % (ref 11.5–15.5)
WBC Count: 9.4 10*3/uL (ref 4.0–10.5)
nRBC: 0 % (ref 0.0–0.2)

## 2022-08-07 LAB — CMP (CANCER CENTER ONLY)
ALT: 25 U/L (ref 0–44)
AST: 19 U/L (ref 15–41)
Albumin: 4.2 g/dL (ref 3.5–5.0)
Alkaline Phosphatase: 84 U/L (ref 38–126)
Anion gap: 8 (ref 5–15)
BUN: 15 mg/dL (ref 6–20)
CO2: 25 mmol/L (ref 22–32)
Calcium: 9.6 mg/dL (ref 8.9–10.3)
Chloride: 106 mmol/L (ref 98–111)
Creatinine: 1 mg/dL (ref 0.61–1.24)
GFR, Estimated: >60 mL/min (ref >60)
Glucose, Bld: 140 mg/dL — ABNORMAL HIGH (ref 70–99)
Potassium: 4 mmol/L (ref 3.5–5.1)
Sodium: 139 mmol/L (ref 135–145)
Total Bilirubin: 0.4 mg/dL (ref 0.3–1.2)
Total Protein: 6.6 g/dL (ref 6.5–8.1)

## 2022-08-07 MED ORDER — HEPARIN SOD (PORK) LOCK FLUSH 100 UNIT/ML IV SOLN
500.0000 [IU] | Freq: Once | INTRAVENOUS | Status: AC | PRN
  Administered 2022-08-07: 500 [IU]

## 2022-08-07 MED ORDER — ALPRAZOLAM 0.25 MG PO TABS: 0.2500 mg | ORAL_TABLET | Freq: Two times a day (BID) | ORAL | 0 refills | Status: DC | PRN

## 2022-08-07 MED ORDER — SODIUM CHLORIDE 0.9 % IV SOLN: Freq: Once | INTRAVENOUS | Status: AC

## 2022-08-07 MED ORDER — SODIUM CHLORIDE 0.9% FLUSH
10.0000 mL | Freq: Once | INTRAVENOUS | Status: AC
  Administered 2022-08-07: 10 mL

## 2022-08-07 MED ORDER — PACLITAXEL PROTEIN-BOUND CHEMO INJECTION 100 MG
100.0000 mg/m2 | Freq: Once | INTRAVENOUS | Status: AC
  Administered 2022-08-07: 250 mg via INTRAVENOUS
  Filled 2022-08-07: qty 50

## 2022-08-07 MED ORDER — PROCHLORPERAZINE MALEATE 10 MG PO TABS
10.0000 mg | ORAL_TABLET | Freq: Once | ORAL | Status: AC
  Administered 2022-08-07: 10 mg via ORAL
  Filled 2022-08-07: qty 1

## 2022-08-07 MED ORDER — SODIUM CHLORIDE 0.9 % IV SOLN
1000.0000 mg/m2 | Freq: Once | INTRAVENOUS | Status: AC
  Administered 2022-08-07: 2394 mg via INTRAVENOUS
  Filled 2022-08-07: qty 62.96

## 2022-08-07 MED ORDER — SODIUM CHLORIDE 0.9% FLUSH
10.0000 mL | INTRAVENOUS | Status: DC | PRN
  Administered 2022-08-07: 10 mL

## 2022-08-07 NOTE — Progress Notes (Signed)
[place]   Telephone:(336) [phone] Fax:(336) [phone]   Clinic Follow up Note   Patient Care Team: [name], MD as PCP - General (Family Medicine) Truitt Merle, MD as Consulting Physician (Oncology)  Date of Service:  08/07/2022  CHIEF COMPLAINT: f/u of  metastatic pancreatic cancer   CURRENT THERAPY:   Second line Gemcitabine/Abraxane, days 1 + 8 q21d, starting 06/05/22     ASSESSMENT:  Steve Mills is a 56 y.o. male with   Pancreatic cancer metastasized to liver [facility]) metastatic to liver, stage IV, KRAS G12R (+), MMR normal  -incidental finding on lung cancer screening chest CT on 01/19/22. 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  -He began first-line palliative FOLFIRINOX on 03/13/22, but unfortunately did not respond well. CA 19-9 also rose on treatment. -he switched to second line gemcitabine/abraxane on 06/05/22. He tolerates moderate well and has continued to work full time. CA 19-9 has dropped on treatment.    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 -lab,flush and Chemo 08/15/22     SUMMARY OF ONCOLOGIC HISTORY: Oncology History  Pancreatic cancer metastasized to liver ([place])  02/28/2022 Cancer Staging   Staging form: Exocrine Pancreas, AJCC 8th Edition - Clinical stage from 02/28/2022: Stage IV (cT2, cN1, pM1) - Signed by Truitt Merle, MD on 03/02/2022 Stage prefix: Initial diagnosis   03/02/2022 Initial Diagnosis   Pancreatic cancer metastasized to liver ([place])   03/13/2022 - 04/13/2022 Chemotherapy   Patient is on Treatment Plan : PANCREAS Modified FOLFIRINOX q14d x 4 cycles     03/13/2022 -  05/17/2022 Chemotherapy   Patient is on Treatment Plan : PANCREAS Modified FOLFIRINOX q14d x 4 cycles     03/18/2022 Genetic Testing   Negative genetic testing on the CancerNext-Expanded+RNAinsight panel.  APC c.4847A>T VUs identified.  The report date is March 18, 2022.  The CancerNext-Expanded gene panel offered by [facility] and includes sequencing and rearrangement analysis for the following 77 genes: AIP, ALK, APC*, ATM*, AXIN2, BAP1, BARD1, BLM, BMPR1A, BRCA1*, BRCA2*, BRIP1*, CDC73, CDH1*, CDK4, CDKN1B, CDKN2A, CHEK2*, CTNNA1, DICER1, FANCC, FH, FLCN, GALNT12, KIF1B, LZTR1, MAX, MEN1, MET, MLH1*, MSH2*, MSH3, MSH6*, MUTYH*, NBN, NF1*, NF2, NTHL1, PALB2*, PHOX2B, PMS2*, POT1, PRKAR1A, PTCH1, PTEN*, RAD51C*, RAD51D*, RB1, RECQL, RET, SDHA, SDHAF2, SDHB, SDHC, SDHD, SMAD4, SMARCA4, SMARCB1, SMARCE1, STK11, SUFU, TMEM127, TP53*, TSC1, TSC2, VHL and XRCC2 (sequencing and deletion/duplication); EGFR, EGLN1, HOXB13, KIT, MITF, PDGFRA, POLD1, and POLE (sequencing only); EPCAM and GREM1 (deletion/duplication only). DNA and RNA analyses performed for * genes.    05/25/2022 Progression   Rising CA 19-9, CT CAP IMPRESSION: 1. Multiple new and enlarged hypodense, rim enhancing liver metastases. 2. Unchanged, ill-defined hypoenhancing mass of the pancreatic uncinate, consistent with pancreatic adenocarcinoma 3. Unchanged small prominent portacaval and gastrohepatic ligament nodes, modestly suspicious for nodal metastases. No overtly enlarged lymph nodes in the abdomen or pelvis. 4. Prostatomegaly.    06/05/2022 -  Chemotherapy   Patient is on Treatment Plan : PANCREATIC Abraxane D1,8,15 + Gemcitabine D1,8,15 q28d        INTERVAL HISTORY:  Steve Mills is here for a follow  up of  metastatic pancreatic cancer   He was last seen by me on 07/17/2022 He presents to the clinic accompanied by wife. 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 He states his Urine  is light in color. Pt state he get SOB after treatment room two days after treatment. He still works. Accept the day after he has treatment.   All other systems were reviewed with the patient and are negative.  MEDICAL HISTORY:  Past Medical History:  Diagnosis Date   Family history of adverse reaction to anesthesia    mother allergic to ether    Family history of breast cancer    Family history of colon cancer    Pneumonia    hx of 2014     SURGICAL HISTORY: Past Surgical History:  Procedure Laterality Date   APPENDECTOMY     CHOLECYSTECTOMY N/A 07/12/2018   Procedure: LAPAROSCOPIC CHOLECYSTECTOMY WITH INTRAOPERATIVE CHOLANGIOGRAM;  Surgeon: Johnathan Hausen, MD;  Location: [facility];  Service: General;  Laterality: N/A;   left wrist surgery      has plate in arm    NASAL POLYP SURGERY  2017   PORTACATH PLACEMENT N/A 03/10/2022   Procedure: INSERTION PORT-A-CATH;  Surgeon: Dwan Bolt, MD;  Location: [place];  Service: General;  Laterality: N/A;   VENTRAL HERNIA REPAIR N/A 07/15/2014   Procedure: LAPAROSCOPIC REPAIR INCARCARTED UMBILICAL  VENTRAL HERNIA, ;  Surgeon: Fanny Skates, MD;  Location: [facility];  Service: General;  Laterality: N/A;  With MESH    I have reviewed the social history and family history with the patient and they are unchanged from previous note.  ALLERGIES:  has No Known Allergies.  MEDICATIONS:  Current Outpatient Medications  Medication Sig Dispense Refill   lidocaine-prilocaine (EMLA) cream Apply 1 Application topically as needed. 30 g 2   No current facility-administered medications for this visit.   Facility-Administered Medications Ordered in Other Visits  Medication Dose Route Frequency Provider Last Rate Last Admin   sodium chloride flush (NS) 0.9 % injection 10 mL  10 mL Intracatheter PRN Truitt Merle, MD   10 mL at 08/07/22 1630    PHYSICAL EXAMINATION: ECOG PERFORMANCE  STATUS: 1 - Symptomatic but completely ambulatory  Vitals:   08/07/22 1315  BP: 137/79  Pulse: 79  Resp: 18  Temp: 98.8 F (37.1 C)  SpO2: 96%   Wt Readings from Last 3 Encounters:  08/07/22 257 lb 6.4 oz (116.8 kg)  07/24/22 261 lb 12 oz (118.7 kg)  07/17/22 261 lb 9.6 oz (118.7 kg)    GENERAL:alert, no distress and comfortable SKIN: skin color, texture, turgor are normal, no rashes or significant lesions EYES: normal, Conjunctiva are pink and non-injected, sclera clear NECK: supple, thyroid normal size, non-tender, without nodularity LYMPH:  no palpable lymphadenopathy in the cervical, axillary  LUNGS: clear to auscultation and percussion with normal breathing effort HEART: regular rate & rhythm and no murmurs and no lower extremity edema ABDOMEN:abdomen soft, non-tender and normal bowel sounds Musculoskeletal:no cyanosis of digits and no clubbing  NEURO: alert & oriented x 3 with fluent speech, no focal motor/sensory deficits . Testing the sensation in his fingers and feet  LABORATORY DATA:  I have reviewed the data as listed    Latest Ref Rng & Units 08/07/2022   12:56 PM 07/24/2022   11:23 AM 07/17/2022    9:15 AM  CBC  WBC 4.0 - 10.5 K/uL 9.4  7.0  9.3   Hemoglobin 13.0 - 17.0 g/dL 11.9  10.7  11.8   Hematocrit 39.0 - 52.0 % 34.9  31.5  35.0   Platelets 150 - 400 K/uL 387  323  364         Latest Ref Rng & Units 08/07/2022   12:56 PM 07/24/2022   11:23 AM 07/17/2022    9:15 AM  CMP  Glucose 70 - 99 mg/dL 140  140  131   BUN 6 - 20 mg/dL _0 Creatinine 0.61 - 1.24 mg/dL 1.00  0.77  0.89   Sodium 135 - 145 mmol/L 139  137  139   Potassium 3.5 - 5.1 mmol/L 4.0  4.0  3.9   Chloride 98 - 111 mmol/L 106  105  106   CO2 22 - 32 mmol/L _1 Calcium 8.9 - 10.3 mg/dL 9.6  9.3  9.6   Total Protein 6.5 - 8.1 g/dL 6.6  6.8  6.9   Total Bilirubin 0.3 - 1.2 mg/dL 0.4  0.3  0.4   Alkaline Phos 38 - 126 U/L 84  76  104   AST 15 - 41 U/L _2 ALT  0 - 44 U/L 25  44  34       RADIOGRAPHIC STUDIES: I have personally reviewed the radiological images as listed and agreed with the findings in the report. No results found.    Orders Placed This Encounter  Procedures   CT CHEST ABDOMEN PELVIS W CONTRAST    Standing Status:   Future    Standing Expiration Date:   08/08/2023    Order Specific Question:   Preferred imaging location?    Answer:   [facility]    Order Specific Question:   Is Oral Contrast requested for this exam?    Answer:   Yes, Per Radiology protocol   CBC with Differential ([place] Only)    Standing Status:   Future    Standing Expiration Date:   08/29/2023   CMP ([place] only)    Standing Status:   Future    Standing Expiration Date:   08/29/2023   CBC with Differential ([place] Only)    Standing Status:   Future    Standing Expiration Date:   09/05/2023   CMP ([place] only)    Standing Status:   Future    Standing Expiration Date:   09/05/2023   All questions were answered. The patient knows to call the clinic with any problems, questions or concerns. No barriers to learning was detected. The total time spent in the appointment was 30 minutes.     Truitt Merle, MD 08/07/2022   Felicity Coyer, CMA, am acting as scribe for Truitt Merle, MD.   I have reviewed the above documentation for accuracy and completeness, and I agree with the above.

## 2022-08-07 NOTE — Patient Instructions (Signed)
[facility]  Discharge Instructions: Thank you for choosing [facility] to provide your oncology and hematology care.   If you have a lab appointment with the [facility], please go directly to the [facility] and check in at the registration area.   Wear comfortable clothing and clothing appropriate for easy access to any Portacath or PICC line.   We strive to give you quality time with your provider. You may need to reschedule your appointment if you arrive late (15 or more minutes).  Arriving late affects you and other patients whose appointments are after yours.  Also, if you miss three or more appointments without notifying the office, you may be dismissed from the clinic at the provider's discretion.      For prescription refill requests, have your pharmacy contact our office and allow 72 hours for refills to be completed.    Today you received the following chemotherapy and/or immunotherapy agents Abraxane & Gemcitabine      To help prevent nausea and vomiting after your treatment, we encourage you to take your nausea medication as directed.  BELOW ARE SYMPTOMS THAT SHOULD BE REPORTED IMMEDIATELY: *FEVER GREATER THAN 100.4 F (38 C) OR HIGHER *CHILLS OR SWEATING *NAUSEA AND VOMITING THAT IS NOT CONTROLLED WITH YOUR NAUSEA MEDICATION *UNUSUAL SHORTNESS OF BREATH *UNUSUAL BRUISING OR BLEEDING *URINARY PROBLEMS (pain or burning when urinating, or frequent urination) *BOWEL PROBLEMS (unusual diarrhea, constipation, pain near the anus) TENDERNESS IN MOUTH AND THROAT WITH OR WITHOUT PRESENCE OF ULCERS (sore throat, sores in mouth, or a toothache) UNUSUAL RASH, SWELLING OR PAIN  UNUSUAL VAGINAL DISCHARGE OR ITCHING   Items with * indicate a potential emergency and should be followed up as soon as possible or go to the Emergency Department if any problems should occur.  Please show the CHEMOTHERAPY ALERT CARD or IMMUNOTHERAPY ALERT CARD at  check-in to the Emergency Department and triage nurse.  Should you have questions after your visit or need to cancel or reschedule your appointment, please contact [facility]  Dept: [phone]  and follow the prompts.  Office hours are 8:00 a.m. to 4:30 p.m. Monday - Friday. Please note that voicemails left after 4:00 p.m. may not be returned until the following business day.  We are closed weekends and major holidays. You have access to a nurse at all times for urgent questions. Please call the main number to the clinic Dept: [phone] and follow the prompts.   For any non-urgent questions, you may also contact your provider using MyChart. We now offer e-Visits for anyone 23 and older to request care online for non-urgent symptoms. For details visit [email or web address].   Also download the MyChart app! Go to the app store, search "MyChart", open the app, select [facility], and log in with your MyChart username and password.  Masks are optional in the cancer centers. If you would like for your care team to wear a mask while they are taking care of you, please let them know. You may have one support person who is at least 57 years old accompany you for your appointments.

## 2022-08-08 ENCOUNTER — Telehealth: Payer: Self-pay | Admitting: Hematology

## 2022-08-08 LAB — CANCER ANTIGEN 19-9: CA 19-9: 49 U/mL — ABNORMAL HIGH (ref 0–35)

## 2022-08-08 NOTE — Telephone Encounter (Signed)
Spoke with patient confirming upcoming appointments  

## 2022-08-09 ENCOUNTER — Other Ambulatory Visit: Payer: Self-pay

## 2022-08-13 ENCOUNTER — Emergency Department (HOSPITAL_COMMUNITY): Payer: BC Managed Care – PPO

## 2022-08-13 ENCOUNTER — Other Ambulatory Visit: Payer: Self-pay

## 2022-08-13 ENCOUNTER — Emergency Department (HOSPITAL_COMMUNITY)
Admission: EM | Admit: 2022-08-13 | Discharge: 2022-08-14 | Disposition: A | Discharge status: Home / Self Care | Payer: BC Managed Care – PPO | Attending: Emergency Medicine | Admitting: Emergency Medicine

## 2022-08-13 DIAGNOSIS — R059 Cough, unspecified: Secondary | ICD-10-CM | POA: Diagnosis not present

## 2022-08-13 DIAGNOSIS — J069 Acute upper respiratory infection, unspecified: Secondary | ICD-10-CM | POA: Diagnosis not present

## 2022-08-13 DIAGNOSIS — R0600 Dyspnea, unspecified: Secondary | ICD-10-CM | POA: Diagnosis not present

## 2022-08-13 DIAGNOSIS — B9789 Other viral agents as the cause of diseases classified elsewhere: Secondary | ICD-10-CM | POA: Diagnosis not present

## 2022-08-13 DIAGNOSIS — C259 Malignant neoplasm of pancreas, unspecified: Secondary | ICD-10-CM | POA: Diagnosis not present

## 2022-08-13 DIAGNOSIS — Z1152 Encounter for screening for COVID-19: Secondary | ICD-10-CM | POA: Diagnosis not present

## 2022-08-13 DIAGNOSIS — J9811 Atelectasis: Secondary | ICD-10-CM | POA: Diagnosis not present

## 2022-08-13 NOTE — ED Triage Notes (Addendum)
Patient coming to ED for evaluation of cough and cold symptoms that started on Tuesday.  Reports no fevers.  Has had non-productive cough and states "I am worried that it might have gone to my chest and caused a pneumonia."  Patient is currently receiving chemo for CA

## 2022-08-13 NOTE — ED Provider Triage Note (Signed)
Emergency Medicine Provider Triage Evaluation Note  Steve Mills , a 56 y.o. male  was evaluated in triage.  Pt complains of productive cough with yellow sputum, congestion, and intermittent fevers for the last 4-5 days. Symptoms consistent with prior sinus infections he reports he gets about once a year. Today started feeling a little short of breath described as like chest congestion on the left side similar to previous episode of pneumonia. Last chemotherapy treatment a week ago. Being treated for metastatic pancreatitic cancer, known mets to liver.   Review of Systems  Positive: See HPI Negative: See HPI  Physical Exam  There were no vitals taken for this visit. Gen:   Awake, no distress   Resp:  Normal effort LCTA MSK:   Moves extremities without difficulty  Other:  RRR, no sinus tenderness  Medical Decision Making  Medically screening exam initiated at 11:19 PM.  Appropriate orders placed.  Steve Mills was informed that the remainder of the evaluation will be completed by another provider, this initial triage assessment does not replace that evaluation, and the importance of remaining in the ED until their evaluation is complete.     Suzzette Righter, PA-C 08/13/22 2322

## 2022-08-14 LAB — CBC WITH DIFFERENTIAL/PLATELET
Abs Immature Granulocytes: 0.39 10*3/uL — ABNORMAL HIGH (ref 0.00–0.07)
Basophils Absolute: 0.1 10*3/uL (ref 0.0–0.1)
Basophils Relative: 1 %
Eosinophils Absolute: 0.2 10*3/uL (ref 0.0–0.5)
Eosinophils Relative: 2 %
HCT: 32 % — ABNORMAL LOW (ref 39.0–52.0)
Hemoglobin: 10.5 g/dL — ABNORMAL LOW (ref 13.0–17.0)
Immature Granulocytes: 4 %
Lymphocytes Relative: 26 %
Lymphs Abs: 2.7 10*3/uL (ref 0.7–4.0)
MCH: 31.4 pg (ref 26.0–34.0)
MCHC: 32.8 g/dL (ref 30.0–36.0)
MCV: 95.8 fL (ref 80.0–100.0)
Monocytes Absolute: 1.5 10*3/uL — ABNORMAL HIGH (ref 0.1–1.0)
Monocytes Relative: 15 %
Neutro Abs: 5.5 10*3/uL (ref 1.7–7.7)
Neutrophils Relative %: 52 %
Platelets: 341 10*3/uL (ref 150–400)
RBC: 3.34 MIL/uL — ABNORMAL LOW (ref 4.22–5.81)
RDW: 14.3 % (ref 11.5–15.5)
WBC: 10.4 10*3/uL (ref 4.0–10.5)
nRBC: 0.3 % — ABNORMAL HIGH (ref 0.0–0.2)

## 2022-08-14 LAB — BASIC METABOLIC PANEL
Anion gap: 10 (ref 5–15)
BUN: 11 mg/dL (ref 6–20)
CO2: 22 mmol/L (ref 22–32)
Calcium: 9 mg/dL (ref 8.9–10.3)
Chloride: 106 mmol/L (ref 98–111)
Creatinine, Ser: 0.93 mg/dL (ref 0.61–1.24)
GFR, Estimated: >60 mL/min (ref >60)
Glucose, Bld: 111 mg/dL — ABNORMAL HIGH (ref 70–99)
Potassium: 3.9 mmol/L (ref 3.5–5.1)
Sodium: 138 mmol/L (ref 135–145)

## 2022-08-14 LAB — RESP PANEL BY RT-PCR (RSV, FLU A&B, COVID)  RVPGX2
Influenza A by PCR: NEGATIVE
Influenza B by PCR: NEGATIVE
Resp Syncytial Virus by PCR: NEGATIVE
SARS Coronavirus 2 by RT PCR: NEGATIVE

## 2022-08-14 LAB — GROUP A STREP BY PCR: Group A Strep by PCR: NOT DETECTED

## 2022-08-14 NOTE — Discharge Instructions (Signed)
Likely a viral infection, recommend over-the-counter pain medications like ibuprofen Tylenol for fever and pain control, nasal decongestions like Flonase and Zyrtec, Mucinex for cough.  If not eating recommend supplementing with Gatorade to help with electrolyte supplementation.  Your strep test is pending at this time, if it is positive I will call you to let you know, and send your prescription to the pharmacy if you do not hear from me and that means it was negative you may also check this on your MyChart account.  Follow-up PCP for further evaluation.  Come back to the emergency department if you develop chest pain, shortness of breath, severe abdominal pain, uncontrolled nausea, vomiting, diarrhea.

## 2022-08-14 NOTE — ED Provider Notes (Signed)
[facility] DEPT Provider Note   CSN: [id number] Arrival date & time: 08/13/22  2302     History  Chief Complaint  Patient presents with   Cough    Steve Mills is a 56 y.o. male.  HPI   Medical history including metastatic pancreatic cancer currently on chemotherapy, presents with complaints of URI-like symptoms.  Started on Tuesday, states she has had sinus pressures, congestion, sore throat and a cough, he states he still tolerating p.o. but does have pain when he swallows, denies any tongue throat lip swelling difficulty breathing, he denies any chest pain or shortness of breath no leg swelling or calf tenderness, no history of PEs or DVTs he is not on hormone therapy.  He received his COVID-vaccine but not got his influenza vaccine.  States that he believes he caught what ever his wife had, he was a prescription so that he might have pneumonia.    Home Medications Prior to Admission medications   Medication Sig Start Date End Date Taking? Authorizing Provider  ALPRAZolam (XANAX) 0.25 MG tablet Take 1 tablet (0.25 mg total) by mouth 2 (two) times daily as needed for anxiety (and nausea). 08/07/22   Truitt Merle, MD  lidocaine-prilocaine (EMLA) cream Apply 1 Application topically as needed. 06/26/22   Truitt Merle, MD  prochlorperazine (COMPAZINE) 10 MG tablet Take 1 tablet (10 mg total) by mouth every 6 (six) hours as needed (Nausea or vomiting). 03/02/22 04/24/22  Truitt Merle, MD      Allergies    Patient has no known allergies.    Review of Systems   Review of Systems  Constitutional:  Negative for chills and fever.  HENT:  Positive for congestion and sore throat.   Respiratory:  Positive for cough. Negative for shortness of breath.   Cardiovascular:  Negative for chest pain.  Gastrointestinal:  Negative for abdominal pain.  Neurological:  Negative for headaches.    Physical Exam Updated Vital Signs BP 138/82 (BP Location: Right Arm)   Pulse  78   Temp 98.1 F (36.7 C) (Oral)   Resp 18   SpO2 94%  Physical Exam Vitals and nursing note reviewed.  Constitutional:      General: He is not in acute distress.    Appearance: He is not ill-appearing.  HENT:     Head: Normocephalic and atraumatic.     Right Ear: Tympanic membrane, ear canal and external ear normal.     Left Ear: Tympanic membrane, ear canal and external ear normal.     Nose: No congestion.     Mouth/Throat:     Mouth: Mucous membranes are moist.     Pharynx: Oropharyngeal exudate present.     Comments: No oral edema present, no trismus no torticollis, tongue uvula are both midline he is controlling oral secretions tonsils are both equal symmetric bilaterally, he does have noted some erythema in posterior pharynx as well as cobblestoning, but no exudates present, no submandibular swelling no muffled tone voice. Eyes:     Conjunctiva/sclera: Conjunctivae normal.  Cardiovascular:     Rate and Rhythm: Normal rate and regular rhythm.     Pulses: Normal pulses.     Heart sounds: No murmur heard.    No friction rub. No gallop.  Pulmonary:     Effort: No respiratory distress.     Breath sounds: No wheezing, rhonchi or rales.  Musculoskeletal:     Right lower leg: No edema.     Left lower leg:  No edema.     Comments: No unilateral leg swelling no calf tenderness no palpable cords  Skin:    General: Skin is warm and dry.  Neurological:     Mental Status: He is alert.  Psychiatric:        Mood and Affect: Mood normal.     ED Results / Procedures / Treatments   Labs (all labs ordered are listed, but only abnormal results are displayed) Labs Reviewed  CBC WITH DIFFERENTIAL/PLATELET - Abnormal; Notable for the following components:      Result Value   RBC 3.34 (*)    Hemoglobin 10.5 (*)    HCT 32.0 (*)    nRBC 0.3 (*)    Monocytes Absolute 1.5 (*)    Abs Immature Granulocytes 0.39 (*)    All other components within normal limits  BASIC METABOLIC PANEL -  Abnormal; Notable for the following components:   Glucose, Bld 111 (*)    All other components within normal limits  RESP PANEL BY RT-PCR (RSV, FLU A&B, COVID)  RVPGX2  GROUP A STREP BY PCR    EKG None  Radiology DG Chest 2 View  Result Date: 08/13/2022 CLINICAL DATA:  Dyspnea EXAM: CHEST - 2 VIEW COMPARISON:  03/10/2022 FINDINGS: Minimal left basilar atelectasis. Lungs are otherwise clear. No pneumothorax or pleural effusion. Right subclavian chest port tip seen within the superior vena cava. Cardiac size within normal limits. Pulmonary vascularity is normal. No acute bone abnormality. IMPRESSION: 1. Minimal left basilar atelectasis. Electronically Signed   By: Fidela Salisbury M.D.   On: 08/13/2022 23:39    Procedures Procedures    Medications Ordered in ED Medications - No data to display  ED Course/ Medical Decision Making/ A&P                           Medical Decision Making  This patient presents to the ED for concern of URI-like symptoms, this involves an extensive number of treatment options, and is a complaint that carries with it a high risk of complications and morbidity.  The differential diagnosis includes pneumonia, PE, URI, strep pharyngitis    Additional history obtained:  Additional history obtained from wife at bedside External records from outside source obtained and reviewed including oncology notes   Co morbidities that complicate the patient evaluation  Immunosuppressed receiving chemotherapy  Social Determinants of Health:  N/A    Lab Tests:  I Ordered, and personally interpreted labs.  The pertinent results include: CBC shows stable normocytic anemia hemoglobin 10.5, BMP shows glucose of 111, respiratory panel negative, strep negative   Imaging Studies ordered:  I ordered imaging studies including chest x-ray I independently visualized and interpreted imaging which showed negative acute findings I agree with the radiologist  interpretation   Cardiac Monitoring:  The patient was maintained on a cardiac monitor.  I personally viewed and interpreted the cardiac monitored which showed an underlying rhythm of: N/A   Medicines ordered and prescription drug management:  I ordered medication including n/a I have reviewed the patients home medicines and have made adjustments as needed  Critical Interventions:  N/a   Reevaluation:  Presents with URI-like symptoms, triage obtain lab work imaging which I personally reviewed unremarkable he had a benign physical exam, will add on strep test.  Consultations Obtained:  N/A    Test Considered:  N/A    Rule out Low suspicion for systemic infection as patient is nontoxic-appearing, vital signs  reassuring, no obvious source infection noted on exam.  Low suspicion for pneumonia as lung sounds are clear bilaterally, x-ray did not reveal any acute findings.  I have low suspicion for PE as patient denies pleuritic chest pain, shortness of breath, nontachypneic nonhypoxic, nontachypneic, there is no real leg swelling presentation is atypical of etiology.  Low suspicion for strep throat as oropharynx was visualized strep test is negative .  I doubt Ludwig angina, retropharyngeal/peritonsillar abscess there is no unilateral swelling the posterior pharynx, no muffled tone voice, there is no submandibular swelling.  Low suspicion patient would need  hospitalized due to viral infection or Covid as vital signs reassuring, patient is not in respiratory distress.     Dispostion and problem list  After consideration of the diagnostic results and the patients response to treatment, I feel that the patent would benefit from discharge.  URI-viral nature, will recommend symptom management, follow-up with PCP for further evaluation and strict return precautions.            Final Clinical Impression(s) / ED Diagnoses Final diagnoses:  Viral URI with cough    Rx / DC  Orders ED Discharge Orders     None         Marcello Fennel, PA-C 08/14/22 0329    Shanon Rosser, MD 08/14/22 [phone]

## 2022-08-15 ENCOUNTER — Other Ambulatory Visit: Payer: BC Managed Care – PPO

## 2022-08-15 ENCOUNTER — Ambulatory Visit: Payer: BC Managed Care – PPO

## 2022-08-15 ENCOUNTER — Other Ambulatory Visit: Payer: Self-pay

## 2022-08-15 ENCOUNTER — Telehealth: Payer: Self-pay

## 2022-08-15 NOTE — Telephone Encounter (Signed)
Spoke with pt via telephone regarding appts today.  Pt stated he called and LVM on the Scheduling Voicemail box regarding canceling his appts today.  Pt stated he was seen in the ED on Christmas Eve and was dx with a Viral Infection.  Pt stated he does not want to do chemo until he is over the viral infection.  Canceled pt's appts for today.

## 2022-08-24 ENCOUNTER — Ambulatory Visit (HOSPITAL_COMMUNITY)
Admission: RE | Admit: 2022-08-24 | Discharge: 2022-08-24 | Disposition: A | Discharge status: Home / Self Care | Payer: BC Managed Care – PPO | Source: Ambulatory Visit | Attending: Hematology | Admitting: Hematology

## 2022-08-24 DIAGNOSIS — C259 Malignant neoplasm of pancreas, unspecified: Secondary | ICD-10-CM | POA: Insufficient documentation

## 2022-08-24 DIAGNOSIS — K7689 Other specified diseases of liver: Secondary | ICD-10-CM | POA: Diagnosis not present

## 2022-08-24 DIAGNOSIS — C787 Secondary malignant neoplasm of liver and intrahepatic bile duct: Secondary | ICD-10-CM | POA: Diagnosis not present

## 2022-08-24 DIAGNOSIS — R911 Solitary pulmonary nodule: Secondary | ICD-10-CM | POA: Diagnosis not present

## 2022-08-24 MED ORDER — SODIUM CHLORIDE (PF) 0.9 % IJ SOLN
INTRAMUSCULAR | Status: AC
  Filled 2022-08-24: qty 50

## 2022-08-24 MED ORDER — IOHEXOL 300 MG/ML  SOLN
100.0000 mL | Freq: Once | INTRAMUSCULAR | Status: AC | PRN
  Administered 2022-08-24: 100 mL via INTRAVENOUS

## 2022-08-25 NOTE — Progress Notes (Unsigned)
[facility]   [phone] [phone]   Clinic Follow up Note   Patient Care Team: [name], MD as PCP - General (Family Medicine) Truitt Merle, MD as Consulting Physician (Oncology)  Date of Service:  08/28/2022  CHIEF COMPLAINT: f/u of metastatic pancreatic cancer     CURRENT THERAPY:  Second line Gemcitabine/Abraxane, days 1 + 8 q21d, starting 06/05/22    ASSESSMENT:  Steve Mills is a 57 y.o. male with   Pancreatic cancer metastasized to liver [facility]) metastatic to liver, stage IV, KRAS G12R (+), MMR normal  -incidental finding on lung cancer screening chest CT on 01/19/22. 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  -He began first-line palliative FOLFIRINOX on 03/13/22, but unfortunately did not respond well. CA 19-9 also rose on treatment. -he switched to second line gemcitabine/abraxane on 06/05/22. He tolerates moderate well and has continued to work full time. CA 19-9 has dropped on treatment. -restaging CT from 08/24/22 showed partial response to treatment, improved primary tumor, and liver/node mets, I discussed with him -will continue current chemo   -will talk him to [facility] Dr. 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  -lab,and chemo 09/04/22 -f/u in 3 weeks  -I called in Augmentin for 7 days before his sinus infection  SUMMARY OF ONCOLOGIC HISTORY: Oncology History  Pancreatic cancer metastasized to liver ([place])  02/28/2022 Cancer Staging   Staging form: Exocrine Pancreas, AJCC 8th Edition - Clinical stage from 02/28/2022: Stage IV (cT2, cN1, pM1) - Signed by Truitt Merle, MD on 03/02/2022 Stage prefix: Initial diagnosis   03/02/2022 Initial Diagnosis   Pancreatic cancer  metastasized to liver ([place])   03/13/2022 - 04/13/2022 Chemotherapy   Patient is on Treatment Plan : PANCREAS Modified FOLFIRINOX q14d x 4 cycles     03/13/2022 - 05/17/2022 Chemotherapy   Patient is on Treatment Plan : PANCREAS Modified FOLFIRINOX q14d x 4 cycles     03/18/2022 Genetic Testing   Negative genetic testing on the CancerNext-Expanded+RNAinsight panel.  APC c.[id number] VUs identified.  The report date is March 18, 2022.  The CancerNext-Expanded gene panel offered by [facility] and includes sequencing and rearrangement analysis for the following 77 genes: AIP, ALK, APC*, ATM*, AXIN2, BAP1, BARD1, BLM, BMPR1A, BRCA1*, BRCA2*, BRIP1*, CDC73, CDH1*, CDK4, CDKN1B, CDKN2A, CHEK2*, CTNNA1, DICER1, FANCC, FH, FLCN, GALNT12, KIF1B, LZTR1, MAX, MEN1, MET, MLH1*, MSH2*, MSH3, MSH6*, MUTYH*, NBN, NF1*, NF2, NTHL1, PALB2*, PHOX2B, PMS2*, POT1, PRKAR1A, PTCH1, PTEN*, RAD51C*, RAD51D*, RB1, RECQL, RET, SDHA, SDHAF2, SDHB, SDHC, SDHD, SMAD4, SMARCA4, SMARCB1, SMARCE1, STK11, SUFU, TMEM127, TP53*, TSC1, TSC2, VHL and XRCC2 (sequencing and deletion/duplication); EGFR, EGLN1, HOXB13, KIT, MITF, PDGFRA, POLD1, and POLE (sequencing only); EPCAM and GREM1 (deletion/duplication only). DNA and RNA analyses performed for * genes.    05/25/2022 Progression   Rising CA 19-9, CT CAP IMPRESSION: 1. Multiple new and enlarged hypodense, rim enhancing liver metastases. 2. Unchanged, ill-defined hypoenhancing mass of the pancreatic uncinate, consistent with pancreatic adenocarcinoma 3. Unchanged small prominent portacaval and gastrohepatic ligament nodes, modestly suspicious for nodal metastases. No overtly enlarged lymph nodes in the abdomen or pelvis. 4. Prostatomegaly.    06/05/2022 -  Chemotherapy   Patient is on Treatment Plan : PANCREATIC Abraxane D1,8,15 +  Gemcitabine D1,8,15 q28d        INTERVAL HISTORY:  Steve Mills is here for a follow up of metastatic pancreatic cancer   He was last seen by me on  08/07/22 He presents to the clinic accompanied by wife. Pt report  that he has some tingling and numbness the feet and hands. Pt report he has been coughing up phlegm and and had some nasal drainage.   All other systems were reviewed with the patient and are negative.  MEDICAL HISTORY:  Past Medical History:  Diagnosis Date   Family history of adverse reaction to anesthesia    mother allergic to ether    Family history of breast cancer    Family history of colon cancer    Pneumonia    hx of 2014     SURGICAL HISTORY: Past Surgical History:  Procedure Laterality Date   APPENDECTOMY     CHOLECYSTECTOMY N/A 07/12/2018   Procedure: LAPAROSCOPIC CHOLECYSTECTOMY WITH INTRAOPERATIVE CHOLANGIOGRAM;  Surgeon: Johnathan Hausen, MD;  Location: [facility];  Service: General;  Laterality: N/A;   left wrist surgery      has plate in arm    NASAL POLYP SURGERY  2017   PORTACATH PLACEMENT N/A 03/10/2022   Procedure: INSERTION PORT-A-CATH;  Surgeon: Dwan Bolt, MD;  Location: [facility];  Service: General;  Laterality: N/A;   VENTRAL HERNIA REPAIR N/A 07/15/2014   Procedure: LAPAROSCOPIC REPAIR INCARCARTED UMBILICAL  VENTRAL HERNIA, ;  Surgeon: Fanny Skates, MD;  Location: [facility];  Service: General;  Laterality: N/A;  With MESH    I have reviewed the social history and family history with the patient and they are unchanged from previous note.  ALLERGIES:  has No Known Allergies.  MEDICATIONS:  Current Outpatient Medications  Medication Sig Dispense Refill   amoxicillin-clavulanate (AUGMENTIN) 875-125 MG tablet Take 1 tablet by mouth 2 (two) times daily. 14 tablet 0   lactobacillus acidophilus & bulgar (LACTINEX) chewable tablet Chew 1 tablet by mouth 3 (three) times daily with meals. 40 tablet 0   ALPRAZolam (XANAX) 0.25 MG tablet Take 1 tablet (0.25 mg total) by mouth 2 (two) times daily as needed for anxiety (and nausea). 20 tablet 0   lidocaine-prilocaine (EMLA) cream Apply 1 Application  topically as needed. 30 g 2   No current facility-administered medications for this visit.   Facility-Administered Medications Ordered in Other Visits  Medication Dose Route Frequency Provider Last Rate Last Admin   0.9 %  sodium chloride infusion   Intravenous Once Truitt Merle, MD       gemcitabine (GEMZAR) 2,395 mg in sodium chloride 0.9 % 250 mL chemo infusion  1,000 mg/m2 (Treatment Plan Recorded) Intravenous Once Truitt Merle, MD       heparin lock flush 100 unit/mL  500 Units Intracatheter Once PRN Truitt Merle, MD       PACLitaxel-protein bound (ABRAXANE) chemo infusion 250 mg  100 mg/m2 (Treatment Plan Recorded) Intravenous Once Truitt Merle, MD       sodium chloride flush (NS) 0.9 % injection 10 mL  10 mL Intracatheter PRN Truitt Merle, MD        PHYSICAL EXAMINATION: ECOG PERFORMANCE STATUS: 1 - Symptomatic but completely ambulatory  Vitals:   08/28/22 0935  BP: (!) 127/93  Pulse: 76  Resp: 15  Temp: 98.1 F (36.7 C)  SpO2: 98%   Wt Readings from Last 3 Encounters:  08/28/22 258 lb 1.6 oz (117.1 kg)  08/07/22 257 lb 6.4 oz (116.8 kg)  07/24/22 261 lb 12 oz (118.7 kg)     GENERAL:alert, no distress and comfortable SKIN: skin color normal, no rashes or significant lesions EYES: normal, Conjunctiva are pink and non-injected, sclera clear  NEURO: alert & oriented x 3 with fluent speech LABORATORY DATA:  I have reviewed the data as listed    Latest Ref Rng & Units 08/28/2022    9:15 AM 08/14/2022   12:19 AM 08/07/2022   12:56 PM  CBC  WBC 4.0 - 10.5 K/uL 8.0  10.4  9.4   Hemoglobin 13.0 - 17.0 g/dL 12.3  10.5  11.9   Hematocrit 39.0 - 52.0 % 35.8  32.0  34.9   Platelets 150 - 400 K/uL 350  341  387         Latest Ref Rng & Units 08/28/2022    9:15 AM 08/14/2022   12:19 AM 08/07/2022   12:56 PM  CMP  Glucose 70 - 99 mg/dL 108  111  140   BUN 6 - 20 mg/dL '15  11  15   '$ Creatinine 0.61 - 1.24 mg/dL 0.88  0.93  1.00   Sodium 135 - 145 mmol/L 137  138  139   Potassium 3.5 -  5.1 mmol/L 3.9  3.9  4.0   Chloride 98 - 111 mmol/L 105  106  106   CO2 22 - 32 mmol/L '26  22  25   '$ Calcium 8.9 - 10.3 mg/dL 9.4  9.0  9.6   Total Protein 6.5 - 8.1 g/dL 6.5   6.6   Total Bilirubin 0.3 - 1.2 mg/dL 0.4   0.4   Alkaline Phos 38 - 126 U/L 73   84   AST 15 - 41 U/L 18   19   ALT 0 - 44 U/L 17   25       RADIOGRAPHIC STUDIES: I have personally reviewed the radiological images as listed and agreed with the findings in the report. No results found.    Orders Placed This Encounter  Procedures   CBC with Differential ([facility] Only)    Standing Status:   Future    Standing Expiration Date:   09/19/2023   CMP ([facility] only)    Standing Status:   Future    Standing Expiration Date:   09/19/2023   CBC with Differential ([facility] Only)    Standing Status:   Future    Standing Expiration Date:   09/26/2023   CMP ([facility] only)    Standing Status:   Future    Standing Expiration Date:   09/26/2023   CBC with Differential ([facility] Only)    Standing Status:   Future    Standing Expiration Date:   10/10/2023   CMP ([facility] only)    Standing Status:   Future    Standing Expiration Date:   10/10/2023   CBC with Differential ([facility] Only)    Standing Status:   Future    Standing Expiration Date:   10/17/2023   CMP ([place] only)    Standing Status:   Future    Standing Expiration Date:   10/17/2023   All questions were answered. The patient knows to call the clinic with any problems, questions or concerns. No barriers to learning was detected. The total time spent in the appointment was 30 minutes.     Truitt Merle, MD 08/28/2022   Felicity Coyer, CMA, am acting as scribe for Truitt Merle, MD.  I have reviewed the above documentation for accuracy and completeness, and I agree with the above.

## 2022-08-27 NOTE — Assessment & Plan Note (Signed)
metastatic to liver, stage IV, KRAS G12R (+), MMR normal  -incidental finding on lung cancer screening chest CT on 01/19/22. 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  -He began first-line palliative FOLFIRINOX on 03/13/22, but unfortunately did not respond well. CA 19-9 also rose on treatment. -he switched to second line gemcitabine/abraxane on 06/05/22. He tolerates moderate well and has continued to work full time. CA 19-9 has dropped on treatment. -restaging CT from 08/24/22 showed partial response to treatment, improved primary tumor, and liver/node mets, I discussed with him -will continue current chemo

## 2022-08-28 ENCOUNTER — Inpatient Hospital Stay: Payer: BC Managed Care – PPO

## 2022-08-28 ENCOUNTER — Inpatient Hospital Stay: Payer: BC Managed Care – PPO | Attending: Hematology

## 2022-08-28 ENCOUNTER — Encounter: Payer: Self-pay | Admitting: Hematology

## 2022-08-28 ENCOUNTER — Inpatient Hospital Stay: Payer: BC Managed Care – PPO | Admitting: Hematology

## 2022-08-28 ENCOUNTER — Other Ambulatory Visit: Payer: Self-pay

## 2022-08-28 VITALS — BP 127/93 | HR 76 | Temp 98.1°F | Resp 15 | Ht 72.0 in | Wt 258.1 lb

## 2022-08-28 DIAGNOSIS — C259 Malignant neoplasm of pancreas, unspecified: Secondary | ICD-10-CM

## 2022-08-28 DIAGNOSIS — C250 Malignant neoplasm of head of pancreas: Secondary | ICD-10-CM | POA: Diagnosis not present

## 2022-08-28 DIAGNOSIS — Z5111 Encounter for antineoplastic chemotherapy: Secondary | ICD-10-CM | POA: Insufficient documentation

## 2022-08-28 DIAGNOSIS — C787 Secondary malignant neoplasm of liver and intrahepatic bile duct: Secondary | ICD-10-CM | POA: Insufficient documentation

## 2022-08-28 DIAGNOSIS — Z95828 Presence of other vascular implants and grafts: Secondary | ICD-10-CM

## 2022-08-28 LAB — CBC WITH DIFFERENTIAL (CANCER CENTER ONLY)
Abs Immature Granulocytes: 0.02 10*3/uL (ref 0.00–0.07)
Basophils Absolute: 0.1 10*3/uL (ref 0.0–0.1)
Basophils Relative: 1 %
Eosinophils Absolute: 0.4 10*3/uL (ref 0.0–0.5)
Eosinophils Relative: 5 %
HCT: 35.8 % — ABNORMAL LOW (ref 39.0–52.0)
Hemoglobin: 12.3 g/dL — ABNORMAL LOW (ref 13.0–17.0)
Immature Granulocytes: 0 %
Lymphocytes Relative: 30 %
Lymphs Abs: 2.4 10*3/uL (ref 0.7–4.0)
MCH: 31.2 pg (ref 26.0–34.0)
MCHC: 34.4 g/dL (ref 30.0–36.0)
MCV: 90.9 fL (ref 80.0–100.0)
Monocytes Absolute: 0.8 10*3/uL (ref 0.1–1.0)
Monocytes Relative: 9 %
Neutro Abs: 4.4 10*3/uL (ref 1.7–7.7)
Neutrophils Relative %: 55 %
Platelet Count: 350 10*3/uL (ref 150–400)
RBC: 3.94 MIL/uL — ABNORMAL LOW (ref 4.22–5.81)
RDW: 14.6 % (ref 11.5–15.5)
WBC Count: 8 10*3/uL (ref 4.0–10.5)
nRBC: 0 % (ref 0.0–0.2)

## 2022-08-28 LAB — CMP (CANCER CENTER ONLY)
ALT: 17 U/L (ref 0–44)
AST: 18 U/L (ref 15–41)
Albumin: 4 g/dL (ref 3.5–5.0)
Alkaline Phosphatase: 73 U/L (ref 38–126)
Anion gap: 6 (ref 5–15)
BUN: 15 mg/dL (ref 6–20)
CO2: 26 mmol/L (ref 22–32)
Calcium: 9.4 mg/dL (ref 8.9–10.3)
Chloride: 105 mmol/L (ref 98–111)
Creatinine: 0.88 mg/dL (ref 0.61–1.24)
GFR, Estimated: >60 mL/min (ref >60)
Glucose, Bld: 108 mg/dL — ABNORMAL HIGH (ref 70–99)
Potassium: 3.9 mmol/L (ref 3.5–5.1)
Sodium: 137 mmol/L (ref 135–145)
Total Bilirubin: 0.4 mg/dL (ref 0.3–1.2)
Total Protein: 6.5 g/dL (ref 6.5–8.1)

## 2022-08-28 MED ORDER — SODIUM CHLORIDE 0.9 % IV SOLN
1000.0000 mg/m2 | Freq: Once | INTRAVENOUS | Status: AC
  Administered 2022-08-28: 2395 mg via INTRAVENOUS
  Filled 2022-08-28: qty 62.99

## 2022-08-28 MED ORDER — SODIUM CHLORIDE 0.9% FLUSH
10.0000 mL | INTRAVENOUS | Status: DC | PRN
  Administered 2022-08-28: 10 mL

## 2022-08-28 MED ORDER — PROCHLORPERAZINE MALEATE 10 MG PO TABS
10.0000 mg | ORAL_TABLET | Freq: Once | ORAL | Status: AC
  Administered 2022-08-28: 10 mg via ORAL
  Filled 2022-08-28: qty 1

## 2022-08-28 MED ORDER — PACLITAXEL PROTEIN-BOUND CHEMO INJECTION 100 MG
100.0000 mg/m2 | Freq: Once | INTRAVENOUS | Status: AC
  Administered 2022-08-28: 250 mg via INTRAVENOUS
  Filled 2022-08-28: qty 50

## 2022-08-28 MED ORDER — HEPARIN SOD (PORK) LOCK FLUSH 100 UNIT/ML IV SOLN
500.0000 [IU] | Freq: Once | INTRAVENOUS | Status: AC | PRN
  Administered 2022-08-28: 500 [IU]

## 2022-08-28 MED ORDER — SODIUM CHLORIDE 0.9 % IV SOLN: Freq: Once | INTRAVENOUS | Status: AC

## 2022-08-28 MED ORDER — SODIUM CHLORIDE 0.9 % IV SOLN: Freq: Once | INTRAVENOUS | Status: DC

## 2022-08-28 MED ORDER — LACTINEX PO CHEW: 1.0000 | CHEWABLE_TABLET | Freq: Three times a day (TID) | ORAL | 0 refills | Status: DC

## 2022-08-28 MED ORDER — SODIUM CHLORIDE 0.9% FLUSH
10.0000 mL | Freq: Once | INTRAVENOUS | Status: AC
  Administered 2022-08-28: 10 mL

## 2022-08-28 MED ORDER — AMOXICILLIN-POT CLAVULANATE 875-125 MG PO TABS: 1.0000 | ORAL_TABLET | Freq: Two times a day (BID) | ORAL | 0 refills | Status: DC

## 2022-08-28 NOTE — Patient Instructions (Signed)
[facility] ONCOLOGY  Discharge Instructions: Thank you for choosing [facility] to provide your oncology and hematology care.   If you have a lab appointment with the [facility], please go directly to the [facility] and check in at the registration area.   Wear comfortable clothing and clothing appropriate for easy access to any Portacath or PICC line.   We strive to give you quality time with your provider. You may need to reschedule your appointment if you arrive late (15 or more minutes).  Arriving late affects you and other patients whose appointments are after yours.  Also, if you miss three or more appointments without notifying the office, you may be dismissed from the clinic at the provider's discretion.      For prescription refill requests, have your pharmacy contact our office and allow 72 hours for refills to be completed.    Today you received the following chemotherapy and/or immunotherapy agents: Abraxane & Gemzar       To help prevent nausea and vomiting after your treatment, we encourage you to take your nausea medication as directed.  BELOW ARE SYMPTOMS THAT SHOULD BE REPORTED IMMEDIATELY: *FEVER GREATER THAN 100.4 F (38 C) OR HIGHER *CHILLS OR SWEATING *NAUSEA AND VOMITING THAT IS NOT CONTROLLED WITH YOUR NAUSEA MEDICATION *UNUSUAL SHORTNESS OF BREATH *UNUSUAL BRUISING OR BLEEDING *URINARY PROBLEMS (pain or burning when urinating, or frequent urination) *BOWEL PROBLEMS (unusual diarrhea, constipation, pain near the anus) TENDERNESS IN MOUTH AND THROAT WITH OR WITHOUT PRESENCE OF ULCERS (sore throat, sores in mouth, or a toothache) UNUSUAL RASH, SWELLING OR PAIN  UNUSUAL VAGINAL DISCHARGE OR ITCHING   Items with * indicate a potential emergency and should be followed up as soon as possible or go to the Emergency Department if any problems should occur.  Please show the CHEMOTHERAPY ALERT CARD or IMMUNOTHERAPY ALERT CARD at  check-in to the Emergency Department and triage nurse.  Should you have questions after your visit or need to cancel or reschedule your appointment, please contact [facility]  Dept: [phone]  and follow the prompts.  Office hours are 8:00 a.m. to 4:30 p.m. Monday - Friday. Please note that voicemails left after 4:00 p.m. may not be returned until the following business day.  We are closed weekends and major holidays. You have access to a nurse at all times for urgent questions. Please call the main number to the clinic Dept: [phone] and follow the prompts.   For any non-urgent questions, you may also contact your provider using MyChart. We now offer e-Visits for anyone 37 and older to request care online for non-urgent symptoms. For details visit [email or web address].   Also download the MyChart app! Go to the app store, search "MyChart", open the app, select [facility], and log in with your MyChart username and password.

## 2022-08-30 ENCOUNTER — Other Ambulatory Visit: Payer: Self-pay | Admitting: Hematology

## 2022-09-02 ENCOUNTER — Other Ambulatory Visit: Payer: Self-pay

## 2022-09-04 ENCOUNTER — Inpatient Hospital Stay: Payer: BC Managed Care – PPO

## 2022-09-04 ENCOUNTER — Other Ambulatory Visit: Payer: Self-pay

## 2022-09-04 VITALS — BP 134/76 | HR 61 | Temp 98.0°F | Resp 17 | Wt 258.0 lb

## 2022-09-04 DIAGNOSIS — C259 Malignant neoplasm of pancreas, unspecified: Secondary | ICD-10-CM

## 2022-09-04 DIAGNOSIS — C250 Malignant neoplasm of head of pancreas: Secondary | ICD-10-CM | POA: Diagnosis not present

## 2022-09-04 DIAGNOSIS — C787 Secondary malignant neoplasm of liver and intrahepatic bile duct: Secondary | ICD-10-CM | POA: Diagnosis not present

## 2022-09-04 DIAGNOSIS — Z95828 Presence of other vascular implants and grafts: Secondary | ICD-10-CM

## 2022-09-04 DIAGNOSIS — Z5111 Encounter for antineoplastic chemotherapy: Secondary | ICD-10-CM | POA: Diagnosis not present

## 2022-09-04 LAB — CMP (CANCER CENTER ONLY)
ALT: 33 U/L (ref 0–44)
AST: 25 U/L (ref 15–41)
Albumin: 3.7 g/dL (ref 3.5–5.0)
Alkaline Phosphatase: 72 U/L (ref 38–126)
Anion gap: 6 (ref 5–15)
BUN: 16 mg/dL (ref 6–20)
CO2: 26 mmol/L (ref 22–32)
Calcium: 9.1 mg/dL (ref 8.9–10.3)
Chloride: 106 mmol/L (ref 98–111)
Creatinine: 0.74 mg/dL (ref 0.61–1.24)
GFR, Estimated: >60 mL/min (ref >60)
Glucose, Bld: 92 mg/dL (ref 70–99)
Potassium: 4.2 mmol/L (ref 3.5–5.1)
Sodium: 138 mmol/L (ref 135–145)
Total Bilirubin: 0.3 mg/dL (ref 0.3–1.2)
Total Protein: 6.7 g/dL (ref 6.5–8.1)

## 2022-09-04 LAB — CBC WITH DIFFERENTIAL (CANCER CENTER ONLY)
Abs Immature Granulocytes: 0.1 10*3/uL — ABNORMAL HIGH (ref 0.00–0.07)
Basophils Absolute: 0.1 10*3/uL (ref 0.0–0.1)
Basophils Relative: 1 %
Eosinophils Absolute: 0.3 10*3/uL (ref 0.0–0.5)
Eosinophils Relative: 4 %
HCT: 30.8 % — ABNORMAL LOW (ref 39.0–52.0)
Hemoglobin: 10.8 g/dL — ABNORMAL LOW (ref 13.0–17.0)
Immature Granulocytes: 2 %
Lymphocytes Relative: 46 %
Lymphs Abs: 3.1 10*3/uL (ref 0.7–4.0)
MCH: 32.1 pg (ref 26.0–34.0)
MCHC: 35.1 g/dL (ref 30.0–36.0)
MCV: 91.7 fL (ref 80.0–100.0)
Monocytes Absolute: 0.7 10*3/uL (ref 0.1–1.0)
Monocytes Relative: 11 %
Neutro Abs: 2.4 10*3/uL (ref 1.7–7.7)
Neutrophils Relative %: 36 %
Platelet Count: 206 10*3/uL (ref 150–400)
RBC: 3.36 MIL/uL — ABNORMAL LOW (ref 4.22–5.81)
RDW: 14.3 % (ref 11.5–15.5)
WBC Count: 6.7 10*3/uL (ref 4.0–10.5)
nRBC: 0.3 % — ABNORMAL HIGH (ref 0.0–0.2)

## 2022-09-04 MED ORDER — SODIUM CHLORIDE 0.9 % IV SOLN: Freq: Once | INTRAVENOUS | Status: AC

## 2022-09-04 MED ORDER — PROCHLORPERAZINE MALEATE 10 MG PO TABS
10.0000 mg | ORAL_TABLET | Freq: Once | ORAL | Status: AC
  Administered 2022-09-04: 10 mg via ORAL
  Filled 2022-09-04: qty 1

## 2022-09-04 MED ORDER — PACLITAXEL PROTEIN-BOUND CHEMO INJECTION 100 MG
100.0000 mg/m2 | Freq: Once | INTRAVENOUS | Status: AC
  Administered 2022-09-04: 250 mg via INTRAVENOUS
  Filled 2022-09-04: qty 50

## 2022-09-04 MED ORDER — HEPARIN SOD (PORK) LOCK FLUSH 100 UNIT/ML IV SOLN
500.0000 [IU] | Freq: Once | INTRAVENOUS | Status: AC | PRN
  Administered 2022-09-04: 500 [IU]

## 2022-09-04 MED ORDER — SODIUM CHLORIDE 0.9 % IV SOLN
1000.0000 mg/m2 | Freq: Once | INTRAVENOUS | Status: AC
  Administered 2022-09-04: 2395 mg via INTRAVENOUS
  Filled 2022-09-04: qty 62.99

## 2022-09-04 MED ORDER — SODIUM CHLORIDE 0.9% FLUSH
10.0000 mL | Freq: Once | INTRAVENOUS | Status: AC
  Administered 2022-09-04: 10 mL

## 2022-09-04 MED ORDER — SODIUM CHLORIDE 0.9% FLUSH
10.0000 mL | INTRAVENOUS | Status: DC | PRN
  Administered 2022-09-04: 10 mL

## 2022-09-05 LAB — CANCER ANTIGEN 19-9: CA 19-9: 33 U/mL (ref 0–35)

## 2022-09-18 ENCOUNTER — Inpatient Hospital Stay: Payer: BC Managed Care – PPO | Admitting: Nurse Practitioner

## 2022-09-18 ENCOUNTER — Telehealth: Payer: Self-pay | Admitting: *Deleted

## 2022-09-18 ENCOUNTER — Inpatient Hospital Stay: Payer: BC Managed Care – PPO

## 2022-09-18 NOTE — Progress Notes (Deleted)
Patient Care Team: [name], MD as PCP - General (Family Medicine) Truitt Merle, MD as Consulting Physician (Oncology)   CHIEF COMPLAINT: Follow-up metastatic pancreatic cancer  Oncology History  Pancreatic cancer metastasized to liver [facility])  02/28/2022 Cancer Staging   Staging form: Exocrine Pancreas, AJCC 8th Edition - Clinical stage from 02/28/2022: Stage IV (cT2, cN1, pM1) - Signed by Truitt Merle, MD on 03/02/2022 Stage prefix: Initial diagnosis   03/02/2022 Initial Diagnosis   Pancreatic cancer metastasized to liver ([place])   03/13/2022 - 04/13/2022 Chemotherapy   Patient is on Treatment Plan : PANCREAS Modified FOLFIRINOX q14d x 4 cycles     03/13/2022 - 05/17/2022 Chemotherapy   Patient is on Treatment Plan : PANCREAS Modified FOLFIRINOX q14d x 4 cycles     03/18/2022 Genetic Testing   Negative genetic testing on the CancerNext-Expanded+RNAinsight panel.  APC c.[id number] VUs identified.  The report date is March 18, 2022.  The CancerNext-Expanded gene panel offered by [facility] and includes sequencing and rearrangement analysis for the following 77 genes: AIP, ALK, APC*, ATM*, AXIN2, BAP1, BARD1, BLM, BMPR1A, BRCA1*, BRCA2*, BRIP1*, CDC73, CDH1*, CDK4, CDKN1B, CDKN2A, CHEK2*, CTNNA1, DICER1, FANCC, FH, FLCN, GALNT12, KIF1B, LZTR1, MAX, MEN1, MET, MLH1*, MSH2*, MSH3, MSH6*, MUTYH*, NBN, NF1*, NF2, NTHL1, PALB2*, PHOX2B, PMS2*, POT1, PRKAR1A, PTCH1, PTEN*, RAD51C*, RAD51D*, RB1, RECQL, RET, SDHA, SDHAF2, SDHB, SDHC, SDHD, SMAD4, SMARCA4, SMARCB1, SMARCE1, STK11, SUFU, TMEM127, TP53*, TSC1, TSC2, VHL and XRCC2 (sequencing and deletion/duplication); EGFR, EGLN1, HOXB13, KIT, MITF, PDGFRA, POLD1, and POLE (sequencing only); EPCAM and GREM1 (deletion/duplication only). DNA and RNA analyses performed for * genes.    05/25/2022 Progression   Rising CA 19-9, CT CAP IMPRESSION: 1. Multiple new and enlarged hypodense, rim enhancing liver metastases. 2. Unchanged, ill-defined hypoenhancing  mass of the pancreatic uncinate, consistent with pancreatic adenocarcinoma 3. Unchanged small prominent portacaval and gastrohepatic ligament nodes, modestly suspicious for nodal metastases. No overtly enlarged lymph nodes in the abdomen or pelvis. 4. Prostatomegaly.    06/05/2022 -  Chemotherapy   Patient is on Treatment Plan : PANCREATIC Abraxane D1,8,15 + Gemcitabine D1,8,15 q28d        CURRENT THERAPY: Second line Gemcitabine/Abraxane, days 1 + 8 q21d, starting 06/05/22   INTERVAL HISTORY history Steve Mills returns for follow-up and treatment as scheduled, last seen by Dr. Burr Medico 08/28/2022 and completed cycle 5 gem/Abraxane.  ROS   Past Medical History:  Diagnosis Date   Family history of adverse reaction to anesthesia    mother allergic to ether    Family history of breast cancer    Family history of colon cancer    Pneumonia    hx of 2014      Past Surgical History:  Procedure Laterality Date   APPENDECTOMY     CHOLECYSTECTOMY N/A 07/12/2018   Procedure: LAPAROSCOPIC CHOLECYSTECTOMY WITH INTRAOPERATIVE CHOLANGIOGRAM;  Surgeon: Johnathan Hausen, MD;  Location: [facility];  Service: General;  Laterality: N/A;   left wrist surgery      has plate in arm    NASAL POLYP SURGERY  2017   PORTACATH PLACEMENT N/A 03/10/2022   Procedure: INSERTION PORT-A-CATH;  Surgeon: Dwan Bolt, MD;  Location: [place];  Service: General;  Laterality: N/A;   VENTRAL HERNIA REPAIR N/A 07/15/2014   Procedure: LAPAROSCOPIC REPAIR INCARCARTED UMBILICAL  VENTRAL HERNIA, ;  Surgeon: Fanny Skates, MD;  Location[facility];  Service: General;  Laterality: N/A;  With MESH     Outpatient Encounter Medications as of 09/18/2022  Medication Sig  ALPRAZolam (XANAX) 0.25 MG tablet Take 1 tablet (0.25 mg total) by mouth 2 (two) times daily as needed for anxiety (and nausea).   amoxicillin-clavulanate (AUGMENTIN) 875-125 MG tablet Take 1 tablet by mouth 2 (two) times daily.   lactobacillus acidophilus & bulgar  (LACTINEX) chewable tablet CHEW 1 TABLET BY MOUTH 3 (THREE) TIMES DAILY WITH MEALS.   lidocaine-prilocaine (EMLA) cream Apply 1 Application topically as needed.   [DISCONTINUED] prochlorperazine (COMPAZINE) 10 MG tablet Take 1 tablet (10 mg total) by mouth every 6 (six) hours as needed (Nausea or vomiting).   No facility-administered encounter medications on file as of 09/18/2022.     There were no vitals filed for this visit. There is no height or weight on file to calculate BMI.   PHYSICAL EXAM GENERAL:alert, no distress and comfortable SKIN: no rash  EYES: sclera clear NECK: without mass LYMPH:  no palpable cervical or supraclavicular lymphadenopathy  LUNGS: clear with normal breathing effort HEART: regular rate & rhythm, no lower extremity edema ABDOMEN: abdomen soft, non-tender and normal bowel sounds NEURO: alert & oriented x 3 with fluent speech, no focal motor/sensory deficits Breast exam:  PAC without erythema    CBC    Component Value Date/Time   WBC 6.7 09/04/2022 1113   WBC 10.4 08/14/2022 0019   RBC 3.36 (L) 09/04/2022 1113   HGB 10.8 (L) 09/04/2022 1113   HCT 30.8 (L) 09/04/2022 1113   PLT 206 09/04/2022 1113   MCV 91.7 09/04/2022 1113   MCH 32.1 09/04/2022 1113   MCHC 35.1 09/04/2022 1113   RDW 14.3 09/04/2022 1113   LYMPHSABS 3.1 09/04/2022 1113   MONOABS 0.7 09/04/2022 1113   EOSABS 0.3 09/04/2022 1113   BASOSABS 0.1 09/04/2022 1113     CMP     Component Value Date/Time   NA 138 09/04/2022 1113   K 4.2 09/04/2022 1113   CL 106 09/04/2022 1113   CO2 26 09/04/2022 1113   GLUCOSE 92 09/04/2022 1113   BUN 16 09/04/2022 1113   CREATININE 0.74 09/04/2022 1113   CALCIUM 9.1 09/04/2022 1113   PROT 6.7 09/04/2022 1113   ALBUMIN 3.7 09/04/2022 1113   AST 25 09/04/2022 1113   ALT 33 09/04/2022 1113   ALKPHOS 72 09/04/2022 1113   BILITOT 0.3 09/04/2022 1113   GFRNONAA >60 09/04/2022 1113   GFRAA >60 07/12/2018 0448     ASSESSMENT &  PLAN:  PLAN:  No orders of the defined types were placed in this encounter.     All questions were answered. The patient knows to call the clinic with any problems, questions or concerns. No barriers to learning were detected. I spent *** counseling the patient face to face. The total time spent in the appointment was *** and more than 50% was on counseling, review of test results, and coordination of care.   Cira Rue, NP-C '@DATE'$ @

## 2022-09-18 NOTE — Telephone Encounter (Signed)
Called patient regarding appts. States he and his family are sick

## 2022-09-18 NOTE — Telephone Encounter (Signed)
Pt is sick

## 2022-09-25 ENCOUNTER — Telehealth: Payer: Self-pay | Admitting: Hematology

## 2022-09-25 ENCOUNTER — Other Ambulatory Visit: Payer: Self-pay

## 2022-09-25 ENCOUNTER — Inpatient Hospital Stay: Payer: BC Managed Care – PPO

## 2022-09-25 ENCOUNTER — Inpatient Hospital Stay: Payer: BC Managed Care – PPO | Attending: Hematology

## 2022-09-25 VITALS — BP 136/92 | HR 67 | Temp 98.3°F | Resp 16 | Wt 259.5 lb

## 2022-09-25 DIAGNOSIS — Z5111 Encounter for antineoplastic chemotherapy: Secondary | ICD-10-CM | POA: Diagnosis not present

## 2022-09-25 DIAGNOSIS — C787 Secondary malignant neoplasm of liver and intrahepatic bile duct: Secondary | ICD-10-CM

## 2022-09-25 DIAGNOSIS — C250 Malignant neoplasm of head of pancreas: Secondary | ICD-10-CM | POA: Diagnosis not present

## 2022-09-25 DIAGNOSIS — G620 Drug-induced polyneuropathy: Secondary | ICD-10-CM | POA: Diagnosis not present

## 2022-09-25 DIAGNOSIS — Z95828 Presence of other vascular implants and grafts: Secondary | ICD-10-CM

## 2022-09-25 LAB — CBC WITH DIFFERENTIAL (CANCER CENTER ONLY)
Abs Immature Granulocytes: 0.04 10*3/uL (ref 0.00–0.07)
Basophils Absolute: 0.1 10*3/uL (ref 0.0–0.1)
Basophils Relative: 1 %
Eosinophils Absolute: 0.7 10*3/uL — ABNORMAL HIGH (ref 0.0–0.5)
Eosinophils Relative: 10 %
HCT: 36.2 % — ABNORMAL LOW (ref 39.0–52.0)
Hemoglobin: 12.2 g/dL — ABNORMAL LOW (ref 13.0–17.0)
Immature Granulocytes: 1 %
Lymphocytes Relative: 40 %
Lymphs Abs: 2.9 10*3/uL (ref 0.7–4.0)
MCH: 30.7 pg (ref 26.0–34.0)
MCHC: 33.7 g/dL (ref 30.0–36.0)
MCV: 91 fL (ref 80.0–100.0)
Monocytes Absolute: 0.7 10*3/uL (ref 0.1–1.0)
Monocytes Relative: 10 %
Neutro Abs: 2.8 10*3/uL (ref 1.7–7.7)
Neutrophils Relative %: 38 %
Platelet Count: 400 10*3/uL (ref 150–400)
RBC: 3.98 MIL/uL — ABNORMAL LOW (ref 4.22–5.81)
RDW: 14.8 % (ref 11.5–15.5)
WBC Count: 7.3 10*3/uL (ref 4.0–10.5)
nRBC: 0 % (ref 0.0–0.2)

## 2022-09-25 LAB — CMP (CANCER CENTER ONLY)
ALT: 26 U/L (ref 0–44)
AST: 21 U/L (ref 15–41)
Albumin: 3.9 g/dL (ref 3.5–5.0)
Alkaline Phosphatase: 78 U/L (ref 38–126)
Anion gap: 7 (ref 5–15)
BUN: 13 mg/dL (ref 6–20)
CO2: 26 mmol/L (ref 22–32)
Calcium: 9.5 mg/dL (ref 8.9–10.3)
Chloride: 105 mmol/L (ref 98–111)
Creatinine: 0.77 mg/dL (ref 0.61–1.24)
GFR, Estimated: >60 mL/min (ref >60)
Glucose, Bld: 126 mg/dL — ABNORMAL HIGH (ref 70–99)
Potassium: 3.7 mmol/L (ref 3.5–5.1)
Sodium: 138 mmol/L (ref 135–145)
Total Bilirubin: 0.4 mg/dL (ref 0.3–1.2)
Total Protein: 6.5 g/dL (ref 6.5–8.1)

## 2022-09-25 MED ORDER — SODIUM CHLORIDE 0.9 % IV SOLN
1000.0000 mg/m2 | Freq: Once | INTRAVENOUS | Status: AC
  Administered 2022-09-25: 2395 mg via INTRAVENOUS
  Filled 2022-09-25: qty 62.99

## 2022-09-25 MED ORDER — SODIUM CHLORIDE 0.9% FLUSH
10.0000 mL | INTRAVENOUS | Status: DC | PRN
  Administered 2022-09-25: 10 mL

## 2022-09-25 MED ORDER — SODIUM CHLORIDE 0.9 % IV SOLN: Freq: Once | INTRAVENOUS | Status: AC

## 2022-09-25 MED ORDER — PROCHLORPERAZINE MALEATE 10 MG PO TABS
10.0000 mg | ORAL_TABLET | Freq: Once | ORAL | Status: AC
  Administered 2022-09-25: 10 mg via ORAL
  Filled 2022-09-25: qty 1

## 2022-09-25 MED ORDER — SODIUM CHLORIDE 0.9% FLUSH
10.0000 mL | Freq: Once | INTRAVENOUS | Status: AC
  Administered 2022-09-25: 10 mL

## 2022-09-25 MED ORDER — PACLITAXEL PROTEIN-BOUND CHEMO INJECTION 100 MG
100.0000 mg/m2 | Freq: Once | INTRAVENOUS | Status: AC
  Administered 2022-09-25: 250 mg via INTRAVENOUS
  Filled 2022-09-25: qty 50

## 2022-09-25 MED ORDER — HEPARIN SOD (PORK) LOCK FLUSH 100 UNIT/ML IV SOLN
500.0000 [IU] | Freq: Once | INTRAVENOUS | Status: AC | PRN
  Administered 2022-09-25: 500 [IU]

## 2022-09-25 NOTE — Telephone Encounter (Signed)
Contacted patient to scheduled appointments. Left message with appointment details and a call back number if patient had any questions or could not accommodate the time we provided.   

## 2022-09-25 NOTE — Patient Instructions (Signed)
[employer], Adult A central line is a long, thin tube (catheter) that is put into a vein so that it goes to a large vein above your heart. It can be used to: Give you medicine or fluids. Give you food and nutrients. Take blood or give you blood for testing or treatments. Types of central lines There are four main types of central lines: Peripherally inserted central catheter (PICC) line. This type is usually put in the upper arm and goes up the arm to the heart. Tunneled central line. This type is placed in a large vein in the neck, chest, or groin. It is tunneled under the skin and brought out through a second incision. Non-tunneled central line. This type is used for a shorter time than other types, usually for 7 days at the most. It is inserted in the neck, chest, or groin. Implanted port. This type can stay in place longer than other types of central lines. It is normally put in the upper chest but can also be placed in the upper arm or the belly. Surgery is needed to put it in and take it out. The type of central line you get will depend on how long you need it and your medical condition. Tell a doctor about: Any allergies you have. All medicines you are taking. These include vitamins, herbs, eye drops, creams, and over-the-counter medicines. Any problems you or family members have had with anesthetic medicines. Any blood disorders you have. Any surgeries you have had. Any medical conditions you have. Whether you are pregnant or may be pregnant. What are the risks? Generally, central lines are safe. However, problems may occur, including: Infection. A blood clot. Bleeding from the place where the central line was inserted. Getting a hole or crack in the central line. If this happens, the central line will need to be replaced. Central line failure. The catheter moving or coming out of place. What happens before the procedure? Medicines Ask your doctor about changing or  stopping: Your normal medicines. Vitamins, herbs, and supplements. Over-the-counter medicines. Do not take aspirin or ibuprofen unless you are told to. General instructions Follow instructions from your doctor about eating or drinking. For your safety, your doctor may: [place] the area of the procedure. Remove hair at the procedure site. Ask you to wash with a soap that kills germs. Plan to have a responsible adult take you home from the hospital or clinic. If you will be going home right after the procedure, plan to have a responsible adult care for you for the time you are told. This is important. What happens during the procedure? An IV tube will be put into one of your veins. You may be given: A sedative. This medicine helps you relax. Anesthetics. These medicines numb certain areas of your body. Your skin will be cleaned with a germ-killing (antiseptic) solution. You may be covered with clean drapes. Your blood pressure, heart rate, breathing rate, and blood oxygen level will be monitored during the procedure. The central line will be put into the vein and moved through it to the correct spot. The doctor may use X-ray equipment to help guide the central line to the right place. A bandage (dressing) will be placed over the insertion area. The procedure may vary among doctors and hospitals. What can I expect after the procedure? You will be monitored until you leave the hospital or clinic. This includes checking your blood pressure, heart rate, breathing rate, and blood oxygen level. Caps may  be placed on the ends of the central line tubing. If you were given a sedative during your procedure, do not drive or use machines until your doctor says that it is safe. Follow these instructions at home: Caring for the tube  Follow instructions from your doctor about: Flushing the tube. Cleaning the tube and the area around it. Only use germ-free (sterile) supplies to flush. The supplies  should be from your doctor, a pharmacy, or another place that your doctor recommends. Before you flush the tube or clean the area around the tube: Wash your hands with soap and water for at least 20 seconds. If you cannot use soap and water, use hand sanitizer. Clean the central line hub with rubbing alcohol. To do this: Scrub it using a twisting motion and rub for 10 to 15 seconds or for 30 twists. Follow the manufacturer's instructions. Be sure you scrub the top of the hub, not just the sides. Never reuse alcohol pads. Let the hub dry before use. Keep it from touching anything while drying. Caring for your skin Check the skin around the central line every day for signs of infection. Check for: Redness, swelling, or pain. Fluid or blood. Warmth. Pus or a bad smell. Keep the area where the tube was put in clean and dry. Change bandages only as told by your doctor. Keep your bandage dry. If a bandage gets wet, have it changed right away. General instructions Keep the tube clamped, unless it is being used. If you or someone else accidentally pulls on the tube, make sure: The bandage is okay. There is no bleeding. The tube has not been pulled out. Do not use scissors or sharp objects near the tube. Do not take baths, swim, or use a hot tub until your doctor says it is okay. Ask your doctor if you may take showers. You may only be allowed to take sponge baths. Ask your doctor what activities are safe for you. Your doctor may tell you not to lift anything or move your arm too much. Take over-the-counter and prescription medicines only as told by your doctor. Keep all follow-up visits. Storing and throwing away supplies Keep your supplies in a clean, dry location. Throw away any used syringes in a container that is only for sharp items (sharps container). You can buy a sharps container from a pharmacy, or you can make one by using an empty hard plastic bottle with a cover. Place any used  bandages or infusion bags into a plastic bag. Throw that bag in the trash. Contact a doctor if: You have any of these signs of infection where the tube was put in: Redness, swelling, or pain. Fluid or blood. Warmth. Pus or a bad smell. Get help right away if: You have: A fever or chills. Shortness of breath. Pain in your chest. A fast heartbeat. Swelling in your neck, face, chest, or arm. You feel dizzy or you faint. There are red lines coming from where the tube was put in. The area where the tube was put in is bleeding and the bleeding will not stop. Your tube is hard to flush. You do not get a blood return from the tube. The tube gets loose or comes out. The tube has a hole or a tear. The tube leaks. Summary A central line is a long, thin tube (catheter) that is put in your vein. It can be used to give you medicine, food, or fluids. Follow instructions from your doctor about flushing  and cleaning the tube. Keep the area where the tube was put in clean and dry. Ask your doctor what activities are safe for you. This information is not intended to replace advice given to you by your health care provider. Make sure you discuss any questions you have with your health care provider. Document Revised: 04/08/2020 Document Reviewed: 04/08/2020 Elsevier Patient Education  [place].

## 2022-09-26 ENCOUNTER — Other Ambulatory Visit: Payer: Self-pay

## 2022-10-01 NOTE — Progress Notes (Unsigned)
Patient Care Team: Steve Cruel, MD as PCP - General (Family Medicine) Steve Merle, MD as Consulting Physician (Oncology)   CHIEF COMPLAINT: Follow up metastatic pancreatic cancer   Oncology History  Pancreatic cancer metastasized to liver [facility])  02/28/2022 Cancer Staging   Staging form: Exocrine Pancreas, AJCC 8th Edition - Clinical stage from 02/28/2022: Stage IV (cT2, cN1, pM1) - Signed by Steve Merle, MD on 03/02/2022 Stage prefix: Initial diagnosis   03/02/2022 Initial Diagnosis   Pancreatic cancer metastasized to liver ([place])   03/13/2022 - 04/13/2022 Chemotherapy   Patient is on Treatment Plan : PANCREAS Modified FOLFIRINOX q14d x 4 cycles     03/13/2022 - 05/17/2022 Chemotherapy   Patient is on Treatment Plan : PANCREAS Modified FOLFIRINOX q14d x 4 cycles     03/18/2022 Genetic Testing   Negative genetic testing on the CancerNext-Expanded+RNAinsight panel.  APC c.4847A>T VUs identified.  The report date is March 18, 2022.  The CancerNext-Expanded gene panel offered by [facility] and includes sequencing and rearrangement analysis for the following 77 genes: AIP, ALK, APC*, ATM*, AXIN2, BAP1, BARD1, BLM, BMPR1A, BRCA1*, BRCA2*, BRIP1*, CDC73, CDH1*, CDK4, CDKN1B, CDKN2A, CHEK2*, CTNNA1, DICER1, FANCC, FH, FLCN, GALNT12, KIF1B, LZTR1, MAX, MEN1, MET, MLH1*, MSH2*, MSH3, MSH6*, MUTYH*, NBN, NF1*, NF2, NTHL1, PALB2*, PHOX2B, PMS2*, POT1, PRKAR1A, PTCH1, PTEN*, RAD51C*, RAD51D*, RB1, RECQL, RET, SDHA, SDHAF2, SDHB, SDHC, SDHD, SMAD4, SMARCA4, SMARCB1, SMARCE1, STK11, SUFU, TMEM127, TP53*, TSC1, TSC2, VHL and XRCC2 (sequencing and deletion/duplication); EGFR, EGLN1, HOXB13, KIT, MITF, PDGFRA, POLD1, and POLE (sequencing only); EPCAM and GREM1 (deletion/duplication only). DNA and RNA analyses performed for * genes.    05/25/2022 Progression   Rising CA 19-9, CT CAP IMPRESSION: 1. Multiple new and enlarged hypodense, rim enhancing liver metastases. 2. Unchanged, ill-defined hypoenhancing  mass of the pancreatic uncinate, consistent with pancreatic adenocarcinoma 3. Unchanged small prominent portacaval and gastrohepatic ligament nodes, modestly suspicious for nodal metastases. No overtly enlarged lymph nodes in the abdomen or pelvis. 4. Prostatomegaly.    06/05/2022 -  Chemotherapy   Patient is on Treatment Plan : PANCREATIC Abraxane D1,8,15 + Gemcitabine D1,8,15 q28d        CURRENT THERAPY: Second line gemcitabine/abraxane days 1 + 8, q21 days, starting 06/05/22  INTERVAL HISTORY Steve Mills returns for follow up and treatment as scheduled. Last seen by Dr. Burr Medico 08/28/22 and completed cycle 5 chemo.   ROS   Past Medical History:  Diagnosis Date   Family history of adverse reaction to anesthesia    mother allergic to ether    Family history of breast cancer    Family history of colon cancer    Pneumonia    hx of 2014      Past Surgical History:  Procedure Laterality Date   APPENDECTOMY     CHOLECYSTECTOMY N/A 07/12/2018   Procedure: LAPAROSCOPIC CHOLECYSTECTOMY WITH INTRAOPERATIVE CHOLANGIOGRAM;  Surgeon: Johnathan Hausen, MD;  Location[facility];  Service: General;  Laterality: N/A;   left wrist surgery      has plate in arm    NASAL POLYP SURGERY  2017   PORTACATH PLACEMENT N/A 03/10/2022   Procedure: INSERTION PORT-A-CATH;  Surgeon: Dwan Bolt, MD;  Location: [place];  Service: General;  Laterality: N/A;   VENTRAL HERNIA REPAIR N/A 07/15/2014   Procedure: LAPAROSCOPIC REPAIR INCARCARTED UMBILICAL  VENTRAL HERNIA, ;  Surgeon: Fanny Skates, MD;  [facility];  Service: General;  Laterality: N/A;  With MESH     Outpatient Encounter Medications as of 10/03/2022  Medication Sig   ALPRAZolam (XANAX) 0.25 MG tablet Take 1 tablet (0.25 mg total) by mouth 2 (two) times daily as needed for anxiety (and nausea).   amoxicillin-clavulanate (AUGMENTIN) 875-125 MG tablet Take 1 tablet by mouth 2 (two) times daily.   lactobacillus acidophilus & bulgar (LACTINEX)  chewable tablet CHEW 1 TABLET BY MOUTH 3 (THREE) TIMES DAILY WITH MEALS.   lidocaine-prilocaine (EMLA) cream Apply 1 Application topically as needed.   [DISCONTINUED] prochlorperazine (COMPAZINE) 10 MG tablet Take 1 tablet (10 mg total) by mouth every 6 (six) hours as needed (Nausea or vomiting).   No facility-administered encounter medications on file as of 10/03/2022.     There were no vitals filed for this visit. There is no height or weight on file to calculate BMI.   PHYSICAL EXAM GENERAL:alert, no distress and comfortable SKIN: no rash  EYES: sclera clear NECK: without mass LYMPH:  no palpable cervical or supraclavicular lymphadenopathy  LUNGS: clear with normal breathing effort HEART: regular rate & rhythm, no lower extremity edema ABDOMEN: abdomen soft, non-tender and normal bowel sounds NEURO: alert & oriented x 3 with fluent speech, no focal motor/sensory deficits Breast exam:  PAC without erythema    CBC    Component Value Date/Time   WBC 7.3 09/25/2022 0930   WBC 10.4 08/14/2022 0019   RBC 3.98 (L) 09/25/2022 0930   HGB 12.2 (L) 09/25/2022 0930   HCT 36.2 (L) 09/25/2022 0930   PLT 400 09/25/2022 0930   MCV 91.0 09/25/2022 0930   MCH 30.7 09/25/2022 0930   MCHC 33.7 09/25/2022 0930   RDW 14.8 09/25/2022 0930   LYMPHSABS 2.9 09/25/2022 0930   MONOABS 0.7 09/25/2022 0930   EOSABS 0.7 (H) 09/25/2022 0930   BASOSABS 0.1 09/25/2022 0930     CMP     Component Value Date/Time   NA 138 09/25/2022 0930   K 3.7 09/25/2022 0930   CL 105 09/25/2022 0930   CO2 26 09/25/2022 0930   GLUCOSE 126 (H) 09/25/2022 0930   BUN 13 09/25/2022 0930   CREATININE 0.77 09/25/2022 0930   CALCIUM 9.5 09/25/2022 0930   PROT 6.5 09/25/2022 0930   ALBUMIN 3.9 09/25/2022 0930   AST 21 09/25/2022 0930   ALT 26 09/25/2022 0930   ALKPHOS 78 09/25/2022 0930   BILITOT 0.4 09/25/2022 0930   GFRNONAA >60 09/25/2022 0930   GFRAA >60 07/12/2018 0448     ASSESSMENT & PLAN:  PLAN:  No  orders of the defined types were placed in this encounter.     All questions were answered. The patient knows to call the clinic with any problems, questions or concerns. No barriers to learning were detected. I spent *** counseling the patient face to face. The total time spent in the appointment was *** and more than 50% was on counseling, review of test results, and coordination of care.   Cira Rue, NP-C @DATE$ @

## 2022-10-02 ENCOUNTER — Other Ambulatory Visit: Payer: Self-pay

## 2022-10-02 DIAGNOSIS — C259 Malignant neoplasm of pancreas, unspecified: Secondary | ICD-10-CM

## 2022-10-03 ENCOUNTER — Inpatient Hospital Stay (HOSPITAL_BASED_OUTPATIENT_CLINIC_OR_DEPARTMENT_OTHER): Payer: BC Managed Care – PPO | Admitting: Nurse Practitioner

## 2022-10-03 ENCOUNTER — Inpatient Hospital Stay: Payer: BC Managed Care – PPO

## 2022-10-03 ENCOUNTER — Other Ambulatory Visit: Payer: Self-pay

## 2022-10-03 ENCOUNTER — Encounter: Payer: Self-pay | Admitting: Nurse Practitioner

## 2022-10-03 VITALS — BP 125/86 | HR 70 | Temp 98.6°F | Resp 16 | Ht 72.0 in | Wt 259.3 lb

## 2022-10-03 DIAGNOSIS — G620 Drug-induced polyneuropathy: Secondary | ICD-10-CM | POA: Diagnosis not present

## 2022-10-03 DIAGNOSIS — C787 Secondary malignant neoplasm of liver and intrahepatic bile duct: Secondary | ICD-10-CM | POA: Diagnosis not present

## 2022-10-03 DIAGNOSIS — C259 Malignant neoplasm of pancreas, unspecified: Secondary | ICD-10-CM | POA: Diagnosis not present

## 2022-10-03 DIAGNOSIS — Z95828 Presence of other vascular implants and grafts: Secondary | ICD-10-CM

## 2022-10-03 DIAGNOSIS — C250 Malignant neoplasm of head of pancreas: Secondary | ICD-10-CM | POA: Diagnosis not present

## 2022-10-03 DIAGNOSIS — Z5111 Encounter for antineoplastic chemotherapy: Secondary | ICD-10-CM | POA: Diagnosis not present

## 2022-10-03 LAB — CMP (CANCER CENTER ONLY)
ALT: 49 U/L — ABNORMAL HIGH (ref 0–44)
AST: 39 U/L (ref 15–41)
Albumin: 3.9 g/dL (ref 3.5–5.0)
Alkaline Phosphatase: 72 U/L (ref 38–126)
Anion gap: 6 (ref 5–15)
BUN: 9 mg/dL (ref 6–20)
CO2: 28 mmol/L (ref 22–32)
Calcium: 9.4 mg/dL (ref 8.9–10.3)
Chloride: 106 mmol/L (ref 98–111)
Creatinine: 0.79 mg/dL (ref 0.61–1.24)
GFR, Estimated: >60 mL/min (ref >60)
Glucose, Bld: 127 mg/dL — ABNORMAL HIGH (ref 70–99)
Potassium: 4 mmol/L (ref 3.5–5.1)
Sodium: 140 mmol/L (ref 135–145)
Total Bilirubin: 0.3 mg/dL (ref 0.3–1.2)
Total Protein: 6.9 g/dL (ref 6.5–8.1)

## 2022-10-03 LAB — CBC WITH DIFFERENTIAL (CANCER CENTER ONLY)
Abs Immature Granulocytes: 0.36 10*3/uL — ABNORMAL HIGH (ref 0.00–0.07)
Basophils Absolute: 0.1 10*3/uL (ref 0.0–0.1)
Basophils Relative: 1 %
Eosinophils Absolute: 0.7 10*3/uL — ABNORMAL HIGH (ref 0.0–0.5)
Eosinophils Relative: 8 %
HCT: 35 % — ABNORMAL LOW (ref 39.0–52.0)
Hemoglobin: 11.8 g/dL — ABNORMAL LOW (ref 13.0–17.0)
Immature Granulocytes: 4 %
Lymphocytes Relative: 31 %
Lymphs Abs: 2.9 10*3/uL (ref 0.7–4.0)
MCH: 30.6 pg (ref 26.0–34.0)
MCHC: 33.7 g/dL (ref 30.0–36.0)
MCV: 90.7 fL (ref 80.0–100.0)
Monocytes Absolute: 0.8 10*3/uL (ref 0.1–1.0)
Monocytes Relative: 8 %
Neutro Abs: 4.6 10*3/uL (ref 1.7–7.7)
Neutrophils Relative %: 48 %
Platelet Count: 223 10*3/uL (ref 150–400)
RBC: 3.86 MIL/uL — ABNORMAL LOW (ref 4.22–5.81)
RDW: 14.4 % (ref 11.5–15.5)
WBC Count: 9.4 10*3/uL (ref 4.0–10.5)
nRBC: 0.2 % (ref 0.0–0.2)

## 2022-10-03 MED ORDER — SODIUM CHLORIDE 0.9 % IV SOLN: Freq: Once | INTRAVENOUS | Status: AC

## 2022-10-03 MED ORDER — PROCHLORPERAZINE MALEATE 10 MG PO TABS
10.0000 mg | ORAL_TABLET | Freq: Once | ORAL | Status: AC
  Administered 2022-10-03: 10 mg via ORAL
  Filled 2022-10-03: qty 1

## 2022-10-03 MED ORDER — PACLITAXEL PROTEIN-BOUND CHEMO INJECTION 100 MG
100.0000 mg/m2 | Freq: Once | INTRAVENOUS | Status: AC
  Administered 2022-10-03: 250 mg via INTRAVENOUS
  Filled 2022-10-03: qty 50

## 2022-10-03 MED ORDER — HEPARIN SOD (PORK) LOCK FLUSH 100 UNIT/ML IV SOLN
500.0000 [IU] | Freq: Once | INTRAVENOUS | Status: AC | PRN
  Administered 2022-10-03: 500 [IU]

## 2022-10-03 MED ORDER — SODIUM CHLORIDE 0.9% FLUSH
10.0000 mL | Freq: Once | INTRAVENOUS | Status: AC
  Administered 2022-10-03: 10 mL

## 2022-10-03 MED ORDER — DEXTROSE 5 % IV SOLN: INTRAVENOUS | Status: DC

## 2022-10-03 MED ORDER — SODIUM CHLORIDE 0.9 % IV SOLN
1000.0000 mg/m2 | Freq: Once | INTRAVENOUS | Status: AC
  Administered 2022-10-03: 2395 mg via INTRAVENOUS
  Filled 2022-10-03: qty 63

## 2022-10-03 NOTE — Progress Notes (Signed)
This RN flushed Pt's PAC with approximately 6 cc of D5W prior to tx and after tx. Pt was encouraged to pinch nose during the flushes. Pt tolerated the D5W flush better than the NS flush, Pt still had one occurrence of dry heaving after the last flush, but occurrence was considerably shorter than previous dry heaving and vomiting periods. Pt stated "I think that helped".

## 2022-10-03 NOTE — Addendum Note (Signed)
Addended by: Tora Kindred on: 10/03/2022 09:53 AM   Modules accepted: Orders

## 2022-10-03 NOTE — Patient Instructions (Signed)
[facility]  Discharge Instructions: Thank you for choosing [facility] to provide your oncology and hematology care.   If you have a lab appointment with the [facility], please go directly to the [facility] and check in at the registration area.   Wear comfortable clothing and clothing appropriate for easy access to any Portacath or PICC line.   We strive to give you quality time with your provider. You may need to reschedule your appointment if you arrive late (15 or more minutes).  Arriving late affects you and other patients whose appointments are after yours.  Also, if you miss three or more appointments without notifying the office, you may be dismissed from the clinic at the provider's discretion.      For prescription refill requests, have your pharmacy contact our office and allow 72 hours for refills to be completed.    Today you received the following chemotherapy and/or immunotherapy agents Abraxane/Gemzar.      To help prevent nausea and vomiting after your treatment, we encourage you to take your nausea medication as directed.  BELOW ARE SYMPTOMS THAT SHOULD BE REPORTED IMMEDIATELY: *FEVER GREATER THAN 100.4 F (38 C) OR HIGHER *CHILLS OR SWEATING *NAUSEA AND VOMITING THAT IS NOT CONTROLLED WITH YOUR NAUSEA MEDICATION *UNUSUAL SHORTNESS OF BREATH *UNUSUAL BRUISING OR BLEEDING *URINARY PROBLEMS (pain or burning when urinating, or frequent urination) *BOWEL PROBLEMS (unusual diarrhea, constipation, pain near the anus) TENDERNESS IN MOUTH AND THROAT WITH OR WITHOUT PRESENCE OF ULCERS (sore throat, sores in mouth, or a toothache) UNUSUAL RASH, SWELLING OR PAIN  UNUSUAL VAGINAL DISCHARGE OR ITCHING   Items with * indicate a potential emergency and should be followed up as soon as possible or go to the Emergency Department if any problems should occur.  Please show the CHEMOTHERAPY ALERT CARD or IMMUNOTHERAPY ALERT CARD at  check-in to the Emergency Department and triage nurse.  Should you have questions after your visit or need to cancel or reschedule your appointment, please contact [facility]  Dept: [phone]  and follow the prompts.  Office hours are 8:00 a.m. to 4:30 p.m. Monday - Friday. Please note that voicemails left after 4:00 p.m. may not be returned until the following business day.  We are closed weekends and major holidays. You have access to a nurse at all times for urgent questions. Please call the main number to the clinic Dept: [phone] and follow the prompts.   For any non-urgent questions, you may also contact your provider using MyChart. We now offer e-Visits for anyone 29 and older to request care online for non-urgent symptoms. For details visit [email or web address].   Also download the MyChart app! Go to the app store, search "MyChart", open the app, select [facility], and log in with your MyChart username and password.

## 2022-10-04 ENCOUNTER — Other Ambulatory Visit: Payer: Self-pay

## 2022-10-04 DIAGNOSIS — C787 Secondary malignant neoplasm of liver and intrahepatic bile duct: Secondary | ICD-10-CM

## 2022-10-04 NOTE — Progress Notes (Signed)
Sent referral order, pathology report, pt demographics, last office note, CT Scans, and had Radiology to push images over in EPIC to [facility] information to New Patient Referral Coordinator for Dr. Cristino Martes [phone]).  Fax confirmation received.

## 2022-10-05 ENCOUNTER — Other Ambulatory Visit: Payer: Self-pay

## 2022-10-05 LAB — CANCER ANTIGEN 19-9: CA 19-9: 40 U/mL — ABNORMAL HIGH (ref 0–35)

## 2022-10-09 ENCOUNTER — Ambulatory Visit: Payer: BC Managed Care – PPO

## 2022-10-09 ENCOUNTER — Other Ambulatory Visit: Payer: BC Managed Care – PPO

## 2022-10-09 ENCOUNTER — Ambulatory Visit: Payer: BC Managed Care – PPO | Admitting: Hematology

## 2022-10-10 DIAGNOSIS — C250 Malignant neoplasm of head of pancreas: Secondary | ICD-10-CM | POA: Diagnosis not present

## 2022-10-13 NOTE — Progress Notes (Unsigned)
Steve Mills   [phone] [phone]   Clinic Follow up Note   Patient Care Team: [name], MD as PCP - General (Family Medicine) Truitt Merle, MD as Consulting Physician (Oncology) Thayer Ohm., MD as Consulting Physician (General Surgery)  Date of Service:  10/16/2022  CHIEF COMPLAINT: f/u of  metastatic pancreatic cancer    CURRENT THERAPY:  Second line gemcitabine/abraxane days 1 + 8, q21 days, starting 06/05/22   ASSESSMENT:  Steve Mills is a 57 y.o. male with   Pancreatic cancer metastasized to liver [facility]) metastatic to liver, stage IV, KRAS G12R (+), MMR normal  -incidental finding on lung cancer screening chest CT on 01/19/22. 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  -He began first-line palliative FOLFIRINOX on 03/13/22, but unfortunately did not respond well. CA 19-9 also rose on treatment. -he switched to second line gemcitabine/abraxane on 06/05/22. He tolerates moderate well and has continued to work full time. CA 19-9 has dropped on treatment. -restaging CT from 08/24/22 showed partial response to treatment, improved primary tumor, and liver/node mets, I discussed with him -will continue current chemo   -Pt was referred to see Dr. Mariah Milling on 10/10/2022 , surgery was discussed but not felt to be a surgical candidate due to his multiple liver metastasis (5), will continue chemotherapy, and repeat CT scan in 2 months.   Peripheral neuropathy secondary to chemotherapy -Started in January 2024, secondary to oxaliplatin and Abraxane -Overall just numbness in fingers and toes, mild tingling, will continue to watch closely -He is taking B12, and use ice packs during chemoinfusion.     PLAN: -lab reviewed -Continue current chemo therapy -Encourage the pt to exercise  -proceed with C7 Abraxane/ Gemcitabine today and  next week  -f/u and C8 on 10/23/2022    SUMMARY OF ONCOLOGIC HISTORY: Oncology History Overview Note   Cancer Staging  Pancreatic cancer metastasized to liver [facility]) Staging form: Exocrine Pancreas, AJCC 8th Edition - Clinical stage from 02/28/2022: Stage IV (cT2, cN1, pM1) - Signed by Truitt Merle, MD on 03/02/2022 Stage prefix: Initial diagnosis     Pancreatic cancer metastasized to liver ([place])  02/28/2022 Cancer Staging   Staging form: Exocrine Pancreas, AJCC 8th Edition - Clinical stage from 02/28/2022: Stage IV (cT2, cN1, pM1) - Signed by Truitt Merle, MD on 03/02/2022 Stage prefix: Initial diagnosis   03/02/2022 Initial Diagnosis   Pancreatic cancer metastasized to liver ([place])   03/13/2022 - 04/13/2022 Chemotherapy   Patient is on Treatment Plan : PANCREAS Modified FOLFIRINOX q14d x 4 cycles     03/13/2022 - 05/17/2022 Chemotherapy   Patient is on Treatment Plan : PANCREAS Modified FOLFIRINOX q14d x 4 cycles     03/18/2022 Genetic Testing   Negative genetic testing on the CancerNext-Expanded+RNAinsight panel.  APC [id number] VUs identified.  The report date is March 18, 2022.  The CancerNext-Expanded gene panel offered by [facility] and includes sequencing and rearrangement analysis for the following 77 genes: AIP, ALK, APC*, ATM*, AXIN2, BAP1, BARD1, BLM, BMPR1A, BRCA1*, BRCA2*, BRIP1*, CDC73, CDH1*, CDK4, CDKN1B, CDKN2A, CHEK2*, CTNNA1, DICER1, FANCC, FH, FLCN, GALNT12, KIF1B, LZTR1, MAX, MEN1, MET, MLH1*, MSH2*, MSH3, MSH6*, MUTYH*, NBN, NF1*, NF2, NTHL1, PALB2*, PHOX2B, PMS2*, POT1, PRKAR1A, PTCH1, PTEN*, RAD51C*, RAD51D*, RB1, RECQL, RET, SDHA, SDHAF2, SDHB, SDHC, SDHD, SMAD4, SMARCA4, SMARCB1, SMARCE1, STK11, SUFU, TMEM127, TP53*, TSC1, TSC2, VHL and XRCC2 (sequencing and deletion/duplication); EGFR,  EGLN1, HOXB13, KIT, MITF, PDGFRA, POLD1, and POLE (sequencing only); EPCAM and GREM1 (deletion/duplication only). DNA and RNA analyses performed for * genes.    05/25/2022 Progression    Rising CA 19-9, CT CAP IMPRESSION: 1. Multiple new and enlarged hypodense, rim enhancing liver metastases. 2. Unchanged, ill-defined hypoenhancing mass of the pancreatic uncinate, consistent with pancreatic adenocarcinoma 3. Unchanged small prominent portacaval and gastrohepatic ligament nodes, modestly suspicious for nodal metastases. No overtly enlarged lymph nodes in the abdomen or pelvis. 4. Prostatomegaly.    06/05/2022 -  Chemotherapy   Patient is on Treatment Plan : PANCREATIC Abraxane D1,8,15 + Gemcitabine D1,8,15 q28d        INTERVAL HISTORY:  Steve Mills is here for a follow up of  metastatic pancreatic cancer   He was last seen by me on 10/03/2022. He presents to the clinic accompanied by wife. Pt state Dr. Mariah Milling will not do surgery if he had more than  three spot and his has 5 spots. Pt state he is still working. Pt states he doesn't work on the day he get Chemo. Pt state after two days of Chemo he feels good. Pt state he still has some numbness and tingling in hands and feet for about a month. Pt denies having any nausea. Pt state every time he gets his port flush with saline he vomits, but its only when he comes to his appointments.      All other systems were reviewed with the patient and are negative.  MEDICAL HISTORY:  Past Medical History:  Diagnosis Date   Family history of adverse reaction to anesthesia    mother allergic to ether    Family history of breast cancer    Family history of colon cancer    Pneumonia    hx of 2014     SURGICAL HISTORY: Past Surgical History:  Procedure Laterality Date   APPENDECTOMY     CHOLECYSTECTOMY N/A 07/12/2018   Procedure: LAPAROSCOPIC CHOLECYSTECTOMY WITH INTRAOPERATIVE CHOLANGIOGRAM;  Surgeon: Johnathan Hausen, MD;  Location: [facility];  Service: General;  Laterality: N/A;   left wrist surgery      has plate in arm    NASAL POLYP SURGERY  2017   PORTACATH PLACEMENT N/A 03/10/2022   Procedure: INSERTION PORT-A-CATH;   Surgeon: Dwan Bolt, MD;  Location: [place];  Service: General;  Laterality: N/A;   VENTRAL HERNIA REPAIR N/A 07/15/2014   Procedure: LAPAROSCOPIC REPAIR INCARCARTED UMBILICAL  VENTRAL HERNIA, ;  Surgeon: Fanny Skates, MD;  Location: [facility];  Service: General;  Laterality: N/A;  With MESH    I have reviewed the social history and family history with the patient and they are unchanged from previous note.  ALLERGIES:  has No Known Allergies.  MEDICATIONS:  Current Outpatient Medications  Medication Sig Dispense Refill   ALPRAZolam (XANAX) 0.25 MG tablet Take 1 tablet (0.25 mg total) by mouth 2 (two) times daily as needed for anxiety (and nausea). (Patient not taking: Reported on 10/03/2022) 20 tablet 0   amoxicillin-clavulanate (AUGMENTIN) 875-125 MG tablet Take 1 tablet by mouth 2 (two) times daily. (Patient not taking: Reported on 10/03/2022) 14 tablet 0   lactobacillus acidophilus & bulgar (LACTINEX) chewable tablet CHEW 1 TABLET BY MOUTH 3 (THREE) TIMES DAILY WITH MEALS. 40 tablet 0   lidocaine-prilocaine (EMLA) cream Apply 1 Application topically as needed. 30 g 2   No current facility-administered medications for this visit.   Facility-Administered Medications Ordered in Other Visits  Medication Dose Route Frequency  Provider Last Rate Last Admin   dextrose 5 % solution   Intravenous Continuous Alla Feeling, NP   Stopped at 10/16/22 1437    PHYSICAL EXAMINATION: ECOG PERFORMANCE STATUS: 1 - Symptomatic but completely ambulatory  Vitals:   10/16/22 1113  BP: 139/87  Pulse: 65  Resp: 15  Temp: 99 F (37.2 C)  SpO2: 98%   Wt Readings from Last 3 Encounters:  10/16/22 261 lb 12.8 oz (118.8 kg)  10/03/22 259 lb 4.8 oz (117.6 kg)  09/25/22 259 lb 8 oz (117.7 kg)     GENERAL:alert, no distress and comfortable SKIN: skin color normal, no rashes or significant lesions EYES: (-) normal, Conjunctiva are pink and non-injected, sclera clear  NEURO: alert & oriented x 3 with  fluent speech   LABORATORY DATA:  I have reviewed the data as listed    Latest Ref Rng & Units 10/16/2022   10:12 AM 10/03/2022    8:44 AM 09/25/2022    9:30 AM  CBC  WBC 4.0 - 10.5 K/uL 7.7  9.4  7.3   Hemoglobin 13.0 - 17.0 g/dL 12.6  11.8  12.2   Hematocrit 39.0 - 52.0 % 36.6  35.0  36.2   Platelets 150 - 400 K/uL 385  223  400         Latest Ref Rng & Units 10/16/2022   10:12 AM 10/03/2022    8:44 AM 09/25/2022    9:30 AM  CMP  Glucose 70 - 99 mg/dL 96  127  126   BUN 6 - 20 mg/dL '13  9  13   '$ Creatinine 0.61 - 1.24 mg/dL 0.88  0.79  0.77   Sodium 135 - 145 mmol/L 139  140  138   Potassium 3.5 - 5.1 mmol/L 4.1  4.0  3.7   Chloride 98 - 111 mmol/L 107  106  105   CO2 22 - 32 mmol/L '26  28  26   '$ Calcium 8.9 - 10.3 mg/dL 8.7  9.4  9.5   Total Protein 6.5 - 8.1 g/dL 6.9  6.9  6.5   Total Bilirubin 0.3 - 1.2 mg/dL 0.3  0.3  0.4   Alkaline Phos 38 - 126 U/L 79  72  78   AST 15 - 41 U/L 23  39  21   ALT 0 - 44 U/L 35  49  26       RADIOGRAPHIC STUDIES: I have personally reviewed the radiological images as listed and agreed with the findings in the report. No results found.    Orders Placed This Encounter  Procedures   CBC with Differential ([place] Only)    Standing Status:   Future    Standing Expiration Date:   11/07/2023   CMP ([place] only)    Standing Status:   Future    Standing Expiration Date:   11/07/2023   CBC with Differential ([place] Only)    Standing Status:   Future    Standing Expiration Date:   11/14/2023   CMP ([place] only)    Standing Status:   Future    Standing Expiration Date:   11/14/2023   CBC with Differential ([place] Only)    Standing Status:   Future    Standing Expiration Date:   11/28/2023   CMP ([place] only)    Standing Status:   Future    Standing Expiration Date:   11/28/2023   CBC with Differential ([place] Only)  Standing Status:   Future    Standing Expiration Date:   12/05/2023   CMP  ([place] only)    Standing Status:   Future    Standing Expiration Date:   12/05/2023   All questions were answered. The patient knows to call the clinic with any problems, questions or concerns. No barriers to learning was detected. The total time spent in the appointment was 30 minutes.     Truitt Merle, MD 10/16/2022   Felicity Coyer, CMA, am acting as scribe for Truitt Merle, MD.   I have reviewed the above documentation for accuracy and completeness, and I agree with the above.

## 2022-10-15 NOTE — Assessment & Plan Note (Signed)
metastatic to liver, stage IV, KRAS G12R (+), MMR normal  -incidental finding on lung cancer screening chest CT on 01/19/22. 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  -He began first-line palliative FOLFIRINOX on 03/13/22, but unfortunately did not respond well. CA 19-9 also rose on treatment. -he switched to second line gemcitabine/abraxane on 06/05/22. He tolerates moderate well and has continued to work full time. CA 19-9 has dropped on treatment. -restaging CT from 08/24/22 showed partial response to treatment, improved primary tumor, and liver/node mets, I discussed with him -will continue current chemo   -Pt was referred to see Dr. Mariah Milling on 10/10/2022

## 2022-10-16 ENCOUNTER — Encounter: Payer: Self-pay | Admitting: Hematology

## 2022-10-16 ENCOUNTER — Inpatient Hospital Stay: Payer: BC Managed Care – PPO

## 2022-10-16 ENCOUNTER — Other Ambulatory Visit: Payer: Self-pay

## 2022-10-16 ENCOUNTER — Inpatient Hospital Stay (HOSPITAL_BASED_OUTPATIENT_CLINIC_OR_DEPARTMENT_OTHER): Payer: BC Managed Care – PPO | Admitting: Hematology

## 2022-10-16 VITALS — BP 139/87 | HR 65 | Temp 99.0°F | Resp 15 | Ht 72.0 in | Wt 261.8 lb

## 2022-10-16 DIAGNOSIS — G620 Drug-induced polyneuropathy: Secondary | ICD-10-CM | POA: Diagnosis not present

## 2022-10-16 DIAGNOSIS — Z95828 Presence of other vascular implants and grafts: Secondary | ICD-10-CM

## 2022-10-16 DIAGNOSIS — C259 Malignant neoplasm of pancreas, unspecified: Secondary | ICD-10-CM

## 2022-10-16 DIAGNOSIS — Z5111 Encounter for antineoplastic chemotherapy: Secondary | ICD-10-CM | POA: Diagnosis not present

## 2022-10-16 DIAGNOSIS — C787 Secondary malignant neoplasm of liver and intrahepatic bile duct: Secondary | ICD-10-CM | POA: Diagnosis not present

## 2022-10-16 DIAGNOSIS — C250 Malignant neoplasm of head of pancreas: Secondary | ICD-10-CM | POA: Diagnosis not present

## 2022-10-16 LAB — CBC WITH DIFFERENTIAL (CANCER CENTER ONLY)
Abs Immature Granulocytes: 0.01 10*3/uL (ref 0.00–0.07)
Basophils Absolute: 0.1 10*3/uL (ref 0.0–0.1)
Basophils Relative: 1 %
Eosinophils Absolute: 0.6 10*3/uL — ABNORMAL HIGH (ref 0.0–0.5)
Eosinophils Relative: 8 %
HCT: 36.6 % — ABNORMAL LOW (ref 39.0–52.0)
Hemoglobin: 12.6 g/dL — ABNORMAL LOW (ref 13.0–17.0)
Immature Granulocytes: 0 %
Lymphocytes Relative: 35 %
Lymphs Abs: 2.7 10*3/uL (ref 0.7–4.0)
MCH: 31.1 pg (ref 26.0–34.0)
MCHC: 34.4 g/dL (ref 30.0–36.0)
MCV: 90.4 fL (ref 80.0–100.0)
Monocytes Absolute: 1.1 10*3/uL — ABNORMAL HIGH (ref 0.1–1.0)
Monocytes Relative: 15 %
Neutro Abs: 3.1 10*3/uL (ref 1.7–7.7)
Neutrophils Relative %: 41 %
Platelet Count: 385 10*3/uL (ref 150–400)
RBC: 4.05 MIL/uL — ABNORMAL LOW (ref 4.22–5.81)
RDW: 15.3 % (ref 11.5–15.5)
WBC Count: 7.7 10*3/uL (ref 4.0–10.5)
nRBC: 0 % (ref 0.0–0.2)

## 2022-10-16 LAB — CMP (CANCER CENTER ONLY)
ALT: 35 U/L (ref 0–44)
AST: 23 U/L (ref 15–41)
Albumin: 4 g/dL (ref 3.5–5.0)
Alkaline Phosphatase: 79 U/L (ref 38–126)
Anion gap: 6 (ref 5–15)
BUN: 13 mg/dL (ref 6–20)
CO2: 26 mmol/L (ref 22–32)
Calcium: 8.7 mg/dL — ABNORMAL LOW (ref 8.9–10.3)
Chloride: 107 mmol/L (ref 98–111)
Creatinine: 0.88 mg/dL (ref 0.61–1.24)
GFR, Estimated: >60 mL/min (ref >60)
Glucose, Bld: 96 mg/dL (ref 70–99)
Potassium: 4.1 mmol/L (ref 3.5–5.1)
Sodium: 139 mmol/L (ref 135–145)
Total Bilirubin: 0.3 mg/dL (ref 0.3–1.2)
Total Protein: 6.9 g/dL (ref 6.5–8.1)

## 2022-10-16 MED ORDER — ALTEPLASE 2 MG IJ SOLR
2.0000 mg | Freq: Once | INTRAMUSCULAR | Status: AC
  Administered 2022-10-16: 2 mg
  Filled 2022-10-16: qty 2

## 2022-10-16 MED ORDER — PROCHLORPERAZINE MALEATE 10 MG PO TABS
10.0000 mg | ORAL_TABLET | Freq: Once | ORAL | Status: AC
  Administered 2022-10-16: 10 mg via ORAL
  Filled 2022-10-16: qty 1

## 2022-10-16 MED ORDER — DEXTROSE 5 % IV SOLN: INTRAVENOUS | Status: DC

## 2022-10-16 MED ORDER — SODIUM CHLORIDE 0.9 % IV SOLN: Freq: Once | INTRAVENOUS | Status: AC

## 2022-10-16 MED ORDER — SODIUM CHLORIDE 0.9% FLUSH
10.0000 mL | Freq: Once | INTRAVENOUS | Status: AC
  Administered 2022-10-16: 10 mL

## 2022-10-16 MED ORDER — HEPARIN SOD (PORK) LOCK FLUSH 100 UNIT/ML IV SOLN
500.0000 [IU] | Freq: Once | INTRAVENOUS | Status: AC | PRN
  Administered 2022-10-16: 500 [IU]

## 2022-10-16 MED ORDER — PACLITAXEL PROTEIN-BOUND CHEMO INJECTION 100 MG
100.0000 mg/m2 | Freq: Once | INTRAVENOUS | Status: AC
  Administered 2022-10-16: 250 mg via INTRAVENOUS
  Filled 2022-10-16: qty 50

## 2022-10-16 MED ORDER — SODIUM CHLORIDE 0.9 % IV SOLN
1000.0000 mg/m2 | Freq: Once | INTRAVENOUS | Status: AC
  Administered 2022-10-16: 2395 mg via INTRAVENOUS
  Filled 2022-10-16: qty 62.99

## 2022-10-16 NOTE — Patient Instructions (Signed)
[facility]  Discharge Instructions: Thank you for choosing [facility] to provide your oncology and hematology care.   If you have a lab appointment with the [facility], please go directly to the [facility] and check in at the registration area.   Wear comfortable clothing and clothing appropriate for easy access to any Portacath or PICC line.   We strive to give you quality time with your provider. You may need to reschedule your appointment if you arrive late (15 or more minutes).  Arriving late affects you and other patients whose appointments are after yours.  Also, if you miss three or more appointments without notifying the office, you may be dismissed from the clinic at the provider's discretion.      For prescription refill requests, have your pharmacy contact our office and allow 72 hours for refills to be completed.    Today you received the following chemotherapy and/or immunotherapy agents: Abraxane/Gemzar      To help prevent nausea and vomiting after your treatment, we encourage you to take your nausea medication as directed.  BELOW ARE SYMPTOMS THAT SHOULD BE REPORTED IMMEDIATELY: *FEVER GREATER THAN 100.4 F (38 C) OR HIGHER *CHILLS OR SWEATING *NAUSEA AND VOMITING THAT IS NOT CONTROLLED WITH YOUR NAUSEA MEDICATION *UNUSUAL SHORTNESS OF BREATH *UNUSUAL BRUISING OR BLEEDING *URINARY PROBLEMS (pain or burning when urinating, or frequent urination) *BOWEL PROBLEMS (unusual diarrhea, constipation, pain near the anus) TENDERNESS IN MOUTH AND THROAT WITH OR WITHOUT PRESENCE OF ULCERS (sore throat, sores in mouth, or a toothache) UNUSUAL RASH, SWELLING OR PAIN  UNUSUAL VAGINAL DISCHARGE OR ITCHING   Items with * indicate a potential emergency and should be followed up as soon as possible or go to the Emergency Department if any problems should occur.  Please show the CHEMOTHERAPY ALERT CARD or IMMUNOTHERAPY ALERT CARD at  check-in to the Emergency Department and triage nurse.  Should you have questions after your visit or need to cancel or reschedule your appointment, please contact [facility]  Dept: [phone]  and follow the prompts.  Office hours are 8:00 a.m. to 4:30 p.m. Monday - Friday. Please note that voicemails left after 4:00 p.m. may not be returned until the following business day.  We are closed weekends and major holidays. You have access to a nurse at all times for urgent questions. Please call the main number to the clinic Dept: [phone] and follow the prompts.   For any non-urgent questions, you may also contact your provider using MyChart. We now offer e-Visits for anyone 69 and older to request care online for non-urgent symptoms. For details visit [email or web address].   Also download the MyChart app! Go to the app store, search "MyChart", open the app, select [place] [facility], and log in with your MyChart username and password.

## 2022-10-18 ENCOUNTER — Telehealth: Payer: Self-pay | Admitting: Hematology

## 2022-10-18 NOTE — Telephone Encounter (Signed)
Contacted patient to scheduled appointments. Patient is aware of appointments that are scheduled.   

## 2022-10-23 ENCOUNTER — Inpatient Hospital Stay: Payer: BC Managed Care – PPO

## 2022-10-23 ENCOUNTER — Inpatient Hospital Stay: Payer: BC Managed Care – PPO | Attending: Hematology

## 2022-10-23 VITALS — BP 137/90 | HR 64 | Temp 98.2°F | Resp 15 | Wt 260.2 lb

## 2022-10-23 DIAGNOSIS — C259 Malignant neoplasm of pancreas, unspecified: Secondary | ICD-10-CM

## 2022-10-23 DIAGNOSIS — G620 Drug-induced polyneuropathy: Secondary | ICD-10-CM | POA: Insufficient documentation

## 2022-10-23 DIAGNOSIS — Z95828 Presence of other vascular implants and grafts: Secondary | ICD-10-CM

## 2022-10-23 DIAGNOSIS — C787 Secondary malignant neoplasm of liver and intrahepatic bile duct: Secondary | ICD-10-CM | POA: Insufficient documentation

## 2022-10-23 DIAGNOSIS — Z5111 Encounter for antineoplastic chemotherapy: Secondary | ICD-10-CM | POA: Insufficient documentation

## 2022-10-23 DIAGNOSIS — C250 Malignant neoplasm of head of pancreas: Secondary | ICD-10-CM | POA: Insufficient documentation

## 2022-10-23 LAB — CBC WITH DIFFERENTIAL (CANCER CENTER ONLY)
Abs Immature Granulocytes: 0.2 10*3/uL — ABNORMAL HIGH (ref 0.00–0.07)
Basophils Absolute: 0.1 10*3/uL (ref 0.0–0.1)
Basophils Relative: 1 %
Eosinophils Absolute: 0.2 10*3/uL (ref 0.0–0.5)
Eosinophils Relative: 3 %
HCT: 33.1 % — ABNORMAL LOW (ref 39.0–52.0)
Hemoglobin: 11.3 g/dL — ABNORMAL LOW (ref 13.0–17.0)
Immature Granulocytes: 4 %
Lymphocytes Relative: 47 %
Lymphs Abs: 2.7 10*3/uL (ref 0.7–4.0)
MCH: 30.6 pg (ref 26.0–34.0)
MCHC: 34.1 g/dL (ref 30.0–36.0)
MCV: 89.7 fL (ref 80.0–100.0)
Monocytes Absolute: 0.9 10*3/uL (ref 0.1–1.0)
Monocytes Relative: 15 %
Neutro Abs: 1.7 10*3/uL (ref 1.7–7.7)
Neutrophils Relative %: 30 %
Platelet Count: 303 10*3/uL (ref 150–400)
RBC: 3.69 MIL/uL — ABNORMAL LOW (ref 4.22–5.81)
RDW: 14.8 % (ref 11.5–15.5)
WBC Count: 5.7 10*3/uL (ref 4.0–10.5)
nRBC: 0.5 % — ABNORMAL HIGH (ref 0.0–0.2)

## 2022-10-23 LAB — CMP (CANCER CENTER ONLY)
ALT: 45 U/L — ABNORMAL HIGH (ref 0–44)
AST: 35 U/L (ref 15–41)
Albumin: 4 g/dL (ref 3.5–5.0)
Alkaline Phosphatase: 76 U/L (ref 38–126)
Anion gap: 6 (ref 5–15)
BUN: 12 mg/dL (ref 6–20)
CO2: 28 mmol/L (ref 22–32)
Calcium: 9.3 mg/dL (ref 8.9–10.3)
Chloride: 105 mmol/L (ref 98–111)
Creatinine: 0.79 mg/dL (ref 0.61–1.24)
GFR, Estimated: >60 mL/min (ref >60)
Glucose, Bld: 114 mg/dL — ABNORMAL HIGH (ref 70–99)
Potassium: 3.8 mmol/L (ref 3.5–5.1)
Sodium: 139 mmol/L (ref 135–145)
Total Bilirubin: 0.3 mg/dL (ref 0.3–1.2)
Total Protein: 6.9 g/dL (ref 6.5–8.1)

## 2022-10-23 MED ORDER — DEXTROSE 5 % IV SOLN: INTRAVENOUS | Status: DC

## 2022-10-23 MED ORDER — SODIUM CHLORIDE 0.9 % IV SOLN: Freq: Once | INTRAVENOUS | Status: DC

## 2022-10-23 MED ORDER — PACLITAXEL PROTEIN-BOUND CHEMO INJECTION 100 MG
100.0000 mg/m2 | Freq: Once | INTRAVENOUS | Status: AC
  Administered 2022-10-23: 250 mg via INTRAVENOUS
  Filled 2022-10-23: qty 50

## 2022-10-23 MED ORDER — SODIUM CHLORIDE 0.9% FLUSH: 10.0000 mL | INTRAVENOUS | Status: DC | PRN

## 2022-10-23 MED ORDER — HEPARIN SOD (PORK) LOCK FLUSH 100 UNIT/ML IV SOLN
500.0000 [IU] | Freq: Once | INTRAVENOUS | Status: AC | PRN
  Administered 2022-10-23: 500 [IU]

## 2022-10-23 MED ORDER — SODIUM CHLORIDE 0.9 % IV SOLN
1000.0000 mg/m2 | Freq: Once | INTRAVENOUS | Status: AC
  Administered 2022-10-23: 2395 mg via INTRAVENOUS
  Filled 2022-10-23: qty 62.99

## 2022-10-23 MED ORDER — SODIUM CHLORIDE 0.9% FLUSH
10.0000 mL | Freq: Once | INTRAVENOUS | Status: AC
  Administered 2022-10-23: 10 mL

## 2022-10-23 MED ORDER — PROCHLORPERAZINE MALEATE 10 MG PO TABS
10.0000 mg | ORAL_TABLET | Freq: Once | ORAL | Status: AC
  Administered 2022-10-23: 10 mg via ORAL
  Filled 2022-10-23: qty 1

## 2022-10-23 NOTE — Patient Instructions (Signed)
[facility]  Discharge Instructions: Thank you for choosing [facility] to provide your oncology and hematology care.   If you have a lab appointment with the [facility], please go directly to the [facility] and check in at the registration area.   Wear comfortable clothing and clothing appropriate for easy access to any Portacath or PICC line.   We strive to give you quality time with your provider. You may need to reschedule your appointment if you arrive late (15 or more minutes).  Arriving late affects you and other patients whose appointments are after yours.  Also, if you miss three or more appointments without notifying the office, you may be dismissed from the clinic at the provider's discretion.      For prescription refill requests, have your pharmacy contact our office and allow 72 hours for refills to be completed.    Today you received the following chemotherapy and/or immunotherapy agents: Paclitaxel - protein bound (Abraxane) and Gemcitabine.   To help prevent nausea and vomiting after your treatment, we encourage you to take your nausea medication as directed.  BELOW ARE SYMPTOMS THAT SHOULD BE REPORTED IMMEDIATELY: *FEVER GREATER THAN 100.4 F (38 C) OR HIGHER *CHILLS OR SWEATING *NAUSEA AND VOMITING THAT IS NOT CONTROLLED WITH YOUR NAUSEA MEDICATION *UNUSUAL SHORTNESS OF BREATH *UNUSUAL BRUISING OR BLEEDING *URINARY PROBLEMS (pain or burning when urinating, or frequent urination) *BOWEL PROBLEMS (unusual diarrhea, constipation, pain near the anus) TENDERNESS IN MOUTH AND THROAT WITH OR WITHOUT PRESENCE OF ULCERS (sore throat, sores in mouth, or a toothache) UNUSUAL RASH, SWELLING OR PAIN  UNUSUAL VAGINAL DISCHARGE OR ITCHING   Items with * indicate a potential emergency and should be followed up as soon as possible or go to the Emergency Department if any problems should occur.  Please show the CHEMOTHERAPY ALERT  CARD or IMMUNOTHERAPY ALERT CARD at check-in to the Emergency Department and triage nurse.  Should you have questions after your visit or need to cancel or reschedule your appointment, please contact [facility]  Dept: [phone]  and follow the prompts.  Office hours are 8:00 a.m. to 4:30 p.m. Monday - Friday. Please note that voicemails left after 4:00 p.m. may not be returned until the following business day.  We are closed weekends and major holidays. You have access to a nurse at all times for urgent questions. Please call the main number to the clinic Dept: [phone] and follow the prompts.   For any non-urgent questions, you may also contact your provider using MyChart. We now offer e-Visits for anyone 70 and older to request care online for non-urgent symptoms. For details visit [email or web address].   Also download the MyChart app! Go to the app store, search "MyChart", open the app, select [facility], and log in with your MyChart username and password.  Paclitaxel Nanoparticle Albumin-Bound Injection What is this medication? NANOPARTICLE ALBUMIN-BOUND PACLITAXEL (Na no PAHR ti kuhl al BYOO muhn-bound PAK li TAX el) treats some types of cancer. It works by slowing down the growth of cancer cells. This medicine may be used for other purposes; ask your health care provider or pharmacist if you have questions. COMMON BRAND NAME(S): Abraxane What should I tell my care team before I take this medication? They need to know if you have any of these conditions: Liver disease Low white blood cell levels An unusual or allergic reaction to paclitaxel, albumin, other medications, foods, dyes,  or preservatives If you or your partner are pregnant or trying to get pregnant Breast-feeding How should I use this medication? This medication is injected into a vein. It is given by your care team in a hospital or clinic setting. Talk to your care team about  the use of this medication in children. Special care may be needed. Overdosage: If you think you have taken too much of this medicine contact a poison control center or emergency room at once. NOTE: This medicine is only for you. Do not share this medicine with others. What if I miss a dose? Keep appointments for follow-up doses. It is important not to miss your dose. Call your care team if you are unable to keep an appointment. What may interact with this medication? Other medications may affect the way this medication works. Talk with your care team about all of the medications you take. They may suggest changes to your treatment plan to lower the risk of side effects and to make sure your medications work as intended. This list may not describe all possible interactions. Give your health care provider a list of all the medicines, herbs, non-prescription drugs, or dietary supplements you use. Also tell them if you smoke, drink alcohol, or use illegal drugs. Some items may interact with your medicine. What should I watch for while using this medication? Your condition will be monitored carefully while you are receiving this medication. You may need blood work while taking this medication. This medication may make you feel generally unwell. This is not uncommon as chemotherapy can affect healthy cells as well as cancer cells. Report any side effects. Continue your course of treatment even though you feel ill unless your care team tells you to stop. This medication can cause serious allergic reactions. To reduce the risk, your care team may give you other medications to take before receiving this one. Be sure to follow the directions from your care team. This medication may increase your risk of getting an infection. Call your care team for advice if you get a fever, chills, sore throat, or other symptoms of a cold or flu. Do not treat yourself. Try to avoid being around people who are sick. This  medication may increase your risk to bruise or bleed. Call your care team if you notice any unusual bleeding. Be careful brushing or flossing your teeth or using a toothpick because you may get an infection or bleed more easily. If you have any dental work done, tell your dentist you are receiving this medication. Talk to your care team if you or your partner may be pregnant. Serious birth defects can occur if you take this medication during pregnancy and for 6 months after the last dose. You will need a negative pregnancy test before starting this medication. Contraception is recommended while taking this medication and for 6 months after the last dose. Your care team can help you find the option that works for you. If your partner can get pregnant, use a condom during sex while taking this medication and for 3 months after the last dose. Do not breastfeed while taking this medication and for 2 weeks after the last dose. This medication may cause infertility. Talk to your care team if you are concerned about your fertility. What side effects may I notice from receiving this medication? Side effects that you should report to your care team as soon as possible: Allergic reactions--skin rash, itching, hives, swelling of the face, lips, tongue, or throat  Dry cough, shortness of breath or trouble breathing Infection--fever, chills, cough, sore throat, wounds that don't heal, pain or trouble when passing urine, general feeling of discomfort or being unwell Low red blood cell level--unusual weakness or fatigue, dizziness, headache, trouble breathing Pain, tingling, or numbness in the hands or feet Stomach pain, unusual weakness or fatigue, nausea, vomiting, diarrhea, or fever that lasts longer than expected Unusual bruising or bleeding Side effects that usually do not require medical attention (report to your care team if they continue or are bothersome): Diarrhea Fatigue Hair loss Loss of  appetite Nausea Vomiting This list may not describe all possible side effects. Call your doctor for medical advice about side effects. You may report side effects to FDA at 1-800-FDA-1088. Where should I keep my medication? This medication is given in a hospital or clinic. It will not be stored at home. NOTE: This sheet is a summary. It may not cover all possible information. If you have questions about this medicine, talk to your doctor, pharmacist, or health care provider.  2023 Elsevier/Gold Standard (2007-09-28 00:00:00)  Gemcitabine Injection What is this medication? GEMCITABINE (jem SYE ta been) treats some types of cancer. It works by slowing down the growth of cancer cells. This medicine may be used for other purposes; ask your health care provider or pharmacist if you have questions. COMMON BRAND NAME(S): Gemzar, Infugem What should I tell my care team before I take this medication? They need to know if you have any of these conditions: Blood disorders Infection Kidney disease Liver disease Lung or breathing disease, such as asthma or COPD Recent or ongoing radiation therapy An unusual or allergic reaction to gemcitabine, other medications, foods, dyes, or preservatives If you or your partner are pregnant or trying to get pregnant Breast-feeding How should I use this medication? This medication is injected into a vein. It is given by your care team in a hospital or clinic setting. Talk to your care team about the use of this medication in children. Special care may be needed. Overdosage: If you think you have taken too much of this medicine contact a poison control center or emergency room at once. NOTE: This medicine is only for you. Do not share this medicine with others. What if I miss a dose? Keep appointments for follow-up doses. It is important not to miss your dose. Call your care team if you are unable to keep an appointment. What may interact with this  medication? Interactions have not been studied. This list may not describe all possible interactions. Give your health care provider a list of all the medicines, herbs, non-prescription drugs, or dietary supplements you use. Also tell them if you smoke, drink alcohol, or use illegal drugs. Some items may interact with your medicine. What should I watch for while using this medication? Your condition will be monitored carefully while you are receiving this medication. This medication may make you feel generally unwell. This is not uncommon, as chemotherapy can affect healthy cells as well as cancer cells. Report any side effects. Continue your course of treatment even though you feel ill unless your care team tells you to stop. In some cases, you may be given additional medications to help with side effects. Follow all directions for their use. This medication may increase your risk of getting an infection. Call your care team for advice if you get a fever, chills, sore throat, or other symptoms of a cold or flu. Do not treat yourself. Try to avoid  being around people who are sick. This medication may increase your risk to bruise or bleed. Call your care team if you notice any unusual bleeding. Be careful brushing or flossing your teeth or using a toothpick because you may get an infection or bleed more easily. If you have any dental work done, tell your dentist you are receiving this medication. Avoid taking medications that contain aspirin, acetaminophen, ibuprofen, naproxen, or ketoprofen unless instructed by your care team. These medications may hide a fever. Talk to your care team if you or your partner wish to become pregnant or think you might be pregnant. This medication can cause serious birth defects if taken during pregnancy and for 6 months after the last dose. A negative pregnancy test is required before starting this medication. A reliable form of contraception is recommended while taking  this medication and for 6 months after the last dose. Talk to your care team about effective forms of contraception. Do not father a child while taking this medication and for 3 months after the last dose. Use a condom while having sex during this time period. Do not breastfeed while taking this medication and for at least 1 week after the last dose. This medication may cause infertility. Talk to your care team if you are concerned about your fertility. What side effects may I notice from receiving this medication? Side effects that you should report to your care team as soon as possible: Allergic reactions--skin rash, itching, hives, swelling of the face, lips, tongue, or throat Capillary leak syndrome--stomach or muscle pain, unusual weakness or fatigue, feeling faint or lightheaded, decrease in the amount of urine, swelling of the ankles, hands, or feet, trouble breathing Infection--fever, chills, cough, sore throat, wounds that don't heal, pain or trouble when passing urine, general feeling of discomfort or being unwell Liver injury--right upper belly pain, loss of appetite, nausea, light-colored stool, dark yellow or brown urine, yellowing skin or eyes, unusual weakness or fatigue Low red blood cell level--unusual weakness or fatigue, dizziness, headache, trouble breathing Lung injury--shortness of breath or trouble breathing, cough, spitting up blood, chest pain, fever Stomach pain, bloody diarrhea, pale skin, unusual weakness or fatigue, decrease in the amount of urine, which may be signs of hemolytic uremic syndrome Sudden and severe headache, confusion, change in vision, seizures, which may be signs of posterior reversible encephalopathy syndrome (PRES) Unusual bruising or bleeding Side effects that usually do not require medical attention (report to your care team if they continue or are bothersome): Diarrhea Drowsiness Hair loss Nausea Pain, redness, or swelling with sores inside the  mouth or throat Vomiting This list may not describe all possible side effects. Call your doctor for medical advice about side effects. You may report side effects to FDA at 1-800-FDA-1088. Where should I keep my medication? This medication is given in a hospital or clinic. It will not be stored at home. NOTE: This sheet is a summary. It may not cover all possible information. If you have questions about this medicine, talk to your doctor, pharmacist, or health care provider.  2023 Elsevier/Gold Standard (2021-12-13 00:00:00)

## 2022-11-05 DIAGNOSIS — G620 Drug-induced polyneuropathy: Secondary | ICD-10-CM | POA: Insufficient documentation

## 2022-11-05 NOTE — Assessment & Plan Note (Signed)
metastatic to liver, stage IV, KRAS G12R (+), MMR normal  -incidental finding on lung cancer screening chest CT on 01/19/22. 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  -He began first-line palliative FOLFIRINOX on 03/13/22, but unfortunately did not respond well. CA 19-9 also rose on treatment. -he switched to second line gemcitabine/abraxane on 06/05/22. He tolerates moderate well and has continued to work full time. CA 19-9 has dropped on treatment. -restaging CT from 08/24/22 showed partial response to treatment, improved primary tumor, and liver/node mets, I discussed with him -will continue current chemo   -Pt was referred to see Dr. Mariah Milling on 10/10/2022 , surgery was discussed but not felt to be a surgical candidate due to his multiple liver metastasis (5), will continue chemotherapy, and repeat CT scan in 3-4 weeks

## 2022-11-05 NOTE — Assessment & Plan Note (Signed)
-  Started in January 2024, secondary to oxaliplatin and Abraxane -Overall just numbness in fingers and toes, mild tingling, will continue to watch closely -He is taking B12, and use ice packs during chemoinfusion.

## 2022-11-06 ENCOUNTER — Other Ambulatory Visit: Payer: Self-pay

## 2022-11-06 ENCOUNTER — Encounter: Payer: Self-pay | Admitting: Hematology

## 2022-11-06 ENCOUNTER — Inpatient Hospital Stay: Payer: BC Managed Care – PPO | Admitting: Hematology

## 2022-11-06 ENCOUNTER — Inpatient Hospital Stay: Payer: BC Managed Care – PPO

## 2022-11-06 VITALS — BP 147/78 | HR 58

## 2022-11-06 VITALS — BP 141/102 | HR 65 | Temp 98.6°F | Resp 16 | Ht 72.0 in | Wt 265.9 lb

## 2022-11-06 DIAGNOSIS — C259 Malignant neoplasm of pancreas, unspecified: Secondary | ICD-10-CM

## 2022-11-06 DIAGNOSIS — Z95828 Presence of other vascular implants and grafts: Secondary | ICD-10-CM

## 2022-11-06 DIAGNOSIS — T451X5A Adverse effect of antineoplastic and immunosuppressive drugs, initial encounter: Secondary | ICD-10-CM

## 2022-11-06 DIAGNOSIS — G620 Drug-induced polyneuropathy: Secondary | ICD-10-CM | POA: Diagnosis not present

## 2022-11-06 DIAGNOSIS — Z5111 Encounter for antineoplastic chemotherapy: Secondary | ICD-10-CM | POA: Diagnosis not present

## 2022-11-06 DIAGNOSIS — C787 Secondary malignant neoplasm of liver and intrahepatic bile duct: Secondary | ICD-10-CM

## 2022-11-06 DIAGNOSIS — C250 Malignant neoplasm of head of pancreas: Secondary | ICD-10-CM | POA: Diagnosis not present

## 2022-11-06 LAB — CMP (CANCER CENTER ONLY)
ALT: 25 U/L (ref 0–44)
AST: 21 U/L (ref 15–41)
Albumin: 3.9 g/dL (ref 3.5–5.0)
Alkaline Phosphatase: 71 U/L (ref 38–126)
Anion gap: 5 (ref 5–15)
BUN: 12 mg/dL (ref 6–20)
CO2: 27 mmol/L (ref 22–32)
Calcium: 9.2 mg/dL (ref 8.9–10.3)
Chloride: 106 mmol/L (ref 98–111)
Creatinine: 0.8 mg/dL (ref 0.61–1.24)
GFR, Estimated: >60 mL/min (ref >60)
Glucose, Bld: 115 mg/dL — ABNORMAL HIGH (ref 70–99)
Potassium: 4.5 mmol/L (ref 3.5–5.1)
Sodium: 138 mmol/L (ref 135–145)
Total Bilirubin: 0.4 mg/dL (ref 0.3–1.2)
Total Protein: 6.8 g/dL (ref 6.5–8.1)

## 2022-11-06 LAB — CBC WITH DIFFERENTIAL (CANCER CENTER ONLY)
Abs Immature Granulocytes: 0.04 10*3/uL (ref 0.00–0.07)
Basophils Absolute: 0.1 10*3/uL (ref 0.0–0.1)
Basophils Relative: 1 %
Eosinophils Absolute: 0.5 10*3/uL (ref 0.0–0.5)
Eosinophils Relative: 7 %
HCT: 35.8 % — ABNORMAL LOW (ref 39.0–52.0)
Hemoglobin: 12.1 g/dL — ABNORMAL LOW (ref 13.0–17.0)
Immature Granulocytes: 1 %
Lymphocytes Relative: 34 %
Lymphs Abs: 2.7 10*3/uL (ref 0.7–4.0)
MCH: 30.8 pg (ref 26.0–34.0)
MCHC: 33.8 g/dL (ref 30.0–36.0)
MCV: 91.1 fL (ref 80.0–100.0)
Monocytes Absolute: 1.1 10*3/uL — ABNORMAL HIGH (ref 0.1–1.0)
Monocytes Relative: 15 %
Neutro Abs: 3.4 10*3/uL (ref 1.7–7.7)
Neutrophils Relative %: 42 %
Platelet Count: 355 10*3/uL (ref 150–400)
RBC: 3.93 MIL/uL — ABNORMAL LOW (ref 4.22–5.81)
RDW: 15.8 % — ABNORMAL HIGH (ref 11.5–15.5)
WBC Count: 7.9 10*3/uL (ref 4.0–10.5)
nRBC: 0 % (ref 0.0–0.2)

## 2022-11-06 MED ORDER — PACLITAXEL PROTEIN-BOUND CHEMO INJECTION 100 MG
100.0000 mg/m2 | Freq: Once | INTRAVENOUS | Status: AC
  Administered 2022-11-06: 250 mg via INTRAVENOUS
  Filled 2022-11-06: qty 50

## 2022-11-06 MED ORDER — HEPARIN SOD (PORK) LOCK FLUSH 100 UNIT/ML IV SOLN
500.0000 [IU] | Freq: Once | INTRAVENOUS | Status: AC | PRN
  Administered 2022-11-06: 500 [IU]

## 2022-11-06 MED ORDER — SODIUM CHLORIDE 0.9% FLUSH
10.0000 mL | Freq: Once | INTRAVENOUS | Status: AC
  Administered 2022-11-06: 10 mL

## 2022-11-06 MED ORDER — SODIUM CHLORIDE 0.9 % IV SOLN
1000.0000 mg/m2 | Freq: Once | INTRAVENOUS | Status: AC
  Administered 2022-11-06: 2394 mg via INTRAVENOUS
  Filled 2022-11-06: qty 62.96

## 2022-11-06 MED ORDER — SODIUM CHLORIDE 0.9% FLUSH
10.0000 mL | INTRAVENOUS | Status: DC | PRN
  Administered 2022-11-06: 10 mL

## 2022-11-06 MED ORDER — SODIUM CHLORIDE 0.9 % IV SOLN: Freq: Once | INTRAVENOUS | Status: AC

## 2022-11-06 MED ORDER — DEXTROSE 5 % IV SOLN: INTRAVENOUS | Status: DC

## 2022-11-06 MED ORDER — PROCHLORPERAZINE MALEATE 10 MG PO TABS
10.0000 mg | ORAL_TABLET | Freq: Once | ORAL | Status: AC
  Administered 2022-11-06: 10 mg via ORAL
  Filled 2022-11-06: qty 1

## 2022-11-06 NOTE — Patient Instructions (Signed)
[facility]  Discharge Instructions: Thank you for choosing [facility] to provide your oncology and hematology care.   If you have a lab appointment with the Cancer Center, please go directly to the Cancer Center and check in at the registration area.   Wear comfortable clothing and clothing appropriate for easy access to any Portacath or PICC line.   We strive to give you quality time with your provider. You may need to reschedule your appointment if you arrive late (15 or more minutes).  Arriving late affects you and other patients whose appointments are after yours.  Also, if you miss three or more appointments without notifying the office, you may be dismissed from the clinic at the provider's discretion.      For prescription refill requests, have your pharmacy contact our office and allow 72 hours for refills to be completed.    Today you received the following chemotherapy and/or immunotherapy agents: Paclitaxel - protein bound (Abraxane) and Gemcitabine.   To help prevent nausea and vomiting after your treatment, we encourage you to take your nausea medication as directed.  BELOW ARE SYMPTOMS THAT SHOULD BE REPORTED IMMEDIATELY: *FEVER GREATER THAN 100.4 F (38 C) OR HIGHER *CHILLS OR SWEATING *NAUSEA AND VOMITING THAT IS NOT CONTROLLED WITH YOUR NAUSEA MEDICATION *UNUSUAL SHORTNESS OF BREATH *UNUSUAL BRUISING OR BLEEDING *URINARY PROBLEMS (pain or burning when urinating, or frequent urination) *BOWEL PROBLEMS (unusual diarrhea, constipation, pain near the anus) TENDERNESS IN MOUTH AND THROAT WITH OR WITHOUT PRESENCE OF ULCERS (sore throat, sores in mouth, or a toothache) UNUSUAL RASH, SWELLING OR PAIN  UNUSUAL VAGINAL DISCHARGE OR ITCHING   Items with * indicate a potential emergency and should be followed up as soon as possible or go to the Emergency Department if any problems should occur.  Please show the CHEMOTHERAPY ALERT  CARD or IMMUNOTHERAPY ALERT CARD at check-in to the Emergency Department and triage nurse.  Should you have questions after your visit or need to cancel or reschedule your appointment, please contact [facility]  Dept: [phone]  and follow the prompts.  Office hours are 8:00 a.m. to 4:30 p.m. Monday - Friday. Please note that voicemails left after 4:00 p.m. may not be returned until the following business day.  We are closed weekends and major holidays. You have access to a nurse at all times for urgent questions. Please call the main number to the clinic Dept: [phone] and follow the prompts.   For any non-urgent questions, you may also contact your provider using MyChart. We now offer e-Visits for anyone 18 and older to request care online for non-urgent symptoms. For details visit [email or web address].   Also download the MyChart app! Go to the app store, search "MyChart", open the app, select [facility], and log in with your MyChart username and password.  Paclitaxel Nanoparticle Albumin-Bound Injection What is this medication? NANOPARTICLE ALBUMIN-BOUND PACLITAXEL (Na no PAHR ti kuhl al BYOO muhn-bound PAK li TAX el) treats some types of cancer. It works by slowing down the growth of cancer cells. This medicine may be used for other purposes; ask your health care provider or pharmacist if you have questions. COMMON BRAND NAME(S): Abraxane What should I tell my care team before I take this medication? They need to know if you have any of these conditions: Liver disease Low white blood cell levels An unusual or allergic reaction to paclitaxel, albumin, other medications, foods, dyes,   or preservatives If you or your partner are pregnant or trying to get pregnant Breast-feeding How should I use this medication? This medication is injected into a vein. It is given by your care team in a hospital or clinic setting. Talk to your care team about  the use of this medication in children. Special care may be needed. Overdosage: If you think you have taken too much of this medicine contact a poison control center or emergency room at once. NOTE: This medicine is only for you. Do not share this medicine with others. What if I miss a dose? Keep appointments for follow-up doses. It is important not to miss your dose. Call your care team if you are unable to keep an appointment. What may interact with this medication? Other medications may affect the way this medication works. Talk with your care team about all of the medications you take. They may suggest changes to your treatment plan to lower the risk of side effects and to make sure your medications work as intended. This list may not describe all possible interactions. Give your health care provider a list of all the medicines, herbs, non-prescription drugs, or dietary supplements you use. Also tell them if you smoke, drink alcohol, or use illegal drugs. Some items may interact with your medicine. What should I watch for while using this medication? Your condition will be monitored carefully while you are receiving this medication. You may need blood work while taking this medication. This medication may make you feel generally unwell. This is not uncommon as chemotherapy can affect healthy cells as well as cancer cells. Report any side effects. Continue your course of treatment even though you feel ill unless your care team tells you to stop. This medication can cause serious allergic reactions. To reduce the risk, your care team may give you other medications to take before receiving this one. Be sure to follow the directions from your care team. This medication may increase your risk of getting an infection. Call your care team for advice if you get a fever, chills, sore throat, or other symptoms of a cold or flu. Do not treat yourself. Try to avoid being around people who are sick. This  medication may increase your risk to bruise or bleed. Call your care team if you notice any unusual bleeding. Be careful brushing or flossing your teeth or using a toothpick because you may get an infection or bleed more easily. If you have any dental work done, tell your dentist you are receiving this medication. Talk to your care team if you or your partner may be pregnant. Serious birth defects can occur if you take this medication during pregnancy and for 6 months after the last dose. You will need a negative pregnancy test before starting this medication. Contraception is recommended while taking this medication and for 6 months after the last dose. Your care team can help you find the option that works for you. If your partner can get pregnant, use a condom during sex while taking this medication and for 3 months after the last dose. Do not breastfeed while taking this medication and for 2 weeks after the last dose. This medication may cause infertility. Talk to your care team if you are concerned about your fertility. What side effects may I notice from receiving this medication? Side effects that you should report to your care team as soon as possible: Allergic reactions--skin rash, itching, hives, swelling of the face, lips, tongue, or throat   Dry cough, shortness of breath or trouble breathing Infection--fever, chills, cough, sore throat, wounds that don't heal, pain or trouble when passing urine, general feeling of discomfort or being unwell Low red blood cell level--unusual weakness or fatigue, dizziness, headache, trouble breathing Pain, tingling, or numbness in the hands or feet Stomach pain, unusual weakness or fatigue, nausea, vomiting, diarrhea, or fever that lasts longer than expected Unusual bruising or bleeding Side effects that usually do not require medical attention (report to your care team if they continue or are bothersome): Diarrhea Fatigue Hair loss Loss of  appetite Nausea Vomiting This list may not describe all possible side effects. Call your doctor for medical advice about side effects. You may report side effects to FDA at 1-800-FDA-1088. Where should I keep my medication? This medication is given in a hospital or clinic. It will not be stored at home. NOTE: This sheet is a summary. It may not cover all possible information. If you have questions about this medicine, talk to your doctor, pharmacist, or health care provider.  2023 Elsevier/Gold Standard (2007-09-28 00:00:00)  Gemcitabine Injection What is this medication? GEMCITABINE (jem SYE ta been) treats some types of cancer. It works by slowing down the growth of cancer cells. This medicine may be used for other purposes; ask your health care provider or pharmacist if you have questions. COMMON BRAND NAME(S): Gemzar, Infugem What should I tell my care team before I take this medication? They need to know if you have any of these conditions: Blood disorders Infection Kidney disease Liver disease Lung or breathing disease, such as asthma or COPD Recent or ongoing radiation therapy An unusual or allergic reaction to gemcitabine, other medications, foods, dyes, or preservatives If you or your partner are pregnant or trying to get pregnant Breast-feeding How should I use this medication? This medication is injected into a vein. It is given by your care team in a hospital or clinic setting. Talk to your care team about the use of this medication in children. Special care may be needed. Overdosage: If you think you have taken too much of this medicine contact a poison control center or emergency room at once. NOTE: This medicine is only for you. Do not share this medicine with others. What if I miss a dose? Keep appointments for follow-up doses. It is important not to miss your dose. Call your care team if you are unable to keep an appointment. What may interact with this  medication? Interactions have not been studied. This list may not describe all possible interactions. Give your health care provider a list of all the medicines, herbs, non-prescription drugs, or dietary supplements you use. Also tell them if you smoke, drink alcohol, or use illegal drugs. Some items may interact with your medicine. What should I watch for while using this medication? Your condition will be monitored carefully while you are receiving this medication. This medication may make you feel generally unwell. This is not uncommon, as chemotherapy can affect healthy cells as well as cancer cells. Report any side effects. Continue your course of treatment even though you feel ill unless your care team tells you to stop. In some cases, you may be given additional medications to help with side effects. Follow all directions for their use. This medication may increase your risk of getting an infection. Call your care team for advice if you get a fever, chills, sore throat, or other symptoms of a cold or flu. Do not treat yourself. Try to avoid   being around people who are sick. This medication may increase your risk to bruise or bleed. Call your care team if you notice any unusual bleeding. Be careful brushing or flossing your teeth or using a toothpick because you may get an infection or bleed more easily. If you have any dental work done, tell your dentist you are receiving this medication. Avoid taking medications that contain aspirin, acetaminophen, ibuprofen, naproxen, or ketoprofen unless instructed by your care team. These medications may hide a fever. Talk to your care team if you or your partner wish to become pregnant or think you might be pregnant. This medication can cause serious birth defects if taken during pregnancy and for 6 months after the last dose. A negative pregnancy test is required before starting this medication. A reliable form of contraception is recommended while taking  this medication and for 6 months after the last dose. Talk to your care team about effective forms of contraception. Do not father a child while taking this medication and for 3 months after the last dose. Use a condom while having sex during this time period. Do not breastfeed while taking this medication and for at least 1 week after the last dose. This medication may cause infertility. Talk to your care team if you are concerned about your fertility. What side effects may I notice from receiving this medication? Side effects that you should report to your care team as soon as possible: Allergic reactions--skin rash, itching, hives, swelling of the face, lips, tongue, or throat Capillary leak syndrome--stomach or muscle pain, unusual weakness or fatigue, feeling faint or lightheaded, decrease in the amount of urine, swelling of the ankles, hands, or feet, trouble breathing Infection--fever, chills, cough, sore throat, wounds that don't heal, pain or trouble when passing urine, general feeling of discomfort or being unwell Liver injury--right upper belly pain, loss of appetite, nausea, light-colored stool, dark yellow or brown urine, yellowing skin or eyes, unusual weakness or fatigue Low red blood cell level--unusual weakness or fatigue, dizziness, headache, trouble breathing Lung injury--shortness of breath or trouble breathing, cough, spitting up blood, chest pain, fever Stomach pain, bloody diarrhea, pale skin, unusual weakness or fatigue, decrease in the amount of urine, which may be signs of hemolytic uremic syndrome Sudden and severe headache, confusion, change in vision, seizures, which may be signs of posterior reversible encephalopathy syndrome (PRES) Unusual bruising or bleeding Side effects that usually do not require medical attention (report to your care team if they continue or are bothersome): Diarrhea Drowsiness Hair loss Nausea Pain, redness, or swelling with sores inside the  mouth or throat Vomiting This list may not describe all possible side effects. Call your doctor for medical advice about side effects. You may report side effects to FDA at 1-800-FDA-1088. Where should I keep my medication? This medication is given in a hospital or clinic. It will not be stored at home. NOTE: This sheet is a summary. It may not cover all possible information. If you have questions about this medicine, talk to your doctor, pharmacist, or health care provider.  2023 Elsevier/Gold Standard (2021-12-13 00:00:00)     

## 2022-11-06 NOTE — Progress Notes (Signed)
[place]   Telephone:(336) [phone] [phone]) [phone]   Clinic Follow up Note   Patient Care Team: [name], MD as PCP - General (Family Medicine) Truitt Merle, MD as Consulting Physician (Oncology) Thayer Ohm., MD as Consulting Physician (General Surgery)  Date of Service:  11/06/2022  CHIEF COMPLAINT: f/u of metastatic pancreatic cancer    CURRENT THERAPY:  Second line gemcitabine/abraxane days 1 + 8, q21 days, starting 06/05/22    ASSESSMENT:  Steve Mills is a 57 y.o. male with   Pancreatic cancer metastasized to liver [facility]) metastatic to liver, stage IV, KRAS G12R (+), MMR normal  -incidental finding on lung cancer screening chest CT on 01/19/22. 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  -He began first-line palliative FOLFIRINOX on 03/13/22, but unfortunately did not respond well. CA 19-9 also rose on treatment. -he switched to second line gemcitabine/abraxane on 06/05/22. He tolerates moderate well and has continued to work full time. CA 19-9 has dropped on treatment. -restaging CT from 08/24/22 showed partial response to treatment, improved primary tumor, and liver/node mets, I discussed with him -will continue current chemo   -Pt was referred to see Dr. Mariah Milling on 10/10/2022 , surgery was discussed but not felt to be a surgical candidate due to his multiple liver metastasis (5), will continue chemotherapy, and repeat CT scan in 3-4 weeks  Peripheral neuropathy due to chemotherapy ([place]) -Started in January 2024, secondary to oxaliplatin and Abraxane -Overall just numbness in fingers and toes, mild tingling, will continue to watch closely -He is taking B12, and use ice packs during chemoinfusion.  -overall mild and stable      PLAN: - lab reviewed -proceed with C8 abraxane/gem today  -repeat CT scan in 11/2022 -lab,and abraxane /gem  11/13/2022 -f/u in 3 weeks before C9 with CT a few days before    SUMMARY OF ONCOLOGIC HISTORY: Oncology History Overview Note   Cancer Staging  Pancreatic cancer metastasized to liver [facility]) Staging form: Exocrine Pancreas, AJCC 8th Edition - Clinical stage from 02/28/2022: Stage IV (cT2, cN1, pM1) - Signed by Truitt Merle, MD on 03/02/2022 Stage prefix: Initial diagnosis     Pancreatic cancer metastasized to liver ([place])  02/28/2022 Cancer Staging   Staging form: Exocrine Pancreas, AJCC 8th Edition - Clinical stage from 02/28/2022: Stage IV (cT2, cN1, pM1) - Signed by Truitt Merle, MD on 03/02/2022 Stage prefix: Initial diagnosis   03/02/2022 Initial Diagnosis   Pancreatic cancer metastasized to liver ([place])   03/13/2022 - 04/13/2022 Chemotherapy   Patient is on Treatment Plan : PANCREAS Modified FOLFIRINOX q14d x 4 cycles     03/13/2022 - 05/17/2022 Chemotherapy   Patient is on Treatment Plan : PANCREAS Modified FOLFIRINOX q14d x 4 cycles     03/18/2022 Genetic Testing   Negative genetic testing on the CancerNext-Expanded+RNAinsight panel.  APC c.[id number] VUs identified.  The report date is March 18, 2022.  The CancerNext-Expanded gene panel offered by [facility] and includes sequencing and rearrangement analysis for the following 77 genes: AIP, ALK, APC*, ATM*, AXIN2, BAP1, BARD1, BLM, BMPR1A, BRCA1*, BRCA2*, BRIP1*, CDC73, CDH1*, CDK4, CDKN1B, CDKN2A, CHEK2*, CTNNA1, DICER1, FANCC, FH, FLCN, GALNT12, KIF1B, LZTR1, MAX, MEN1, MET, MLH1*, MSH2*, MSH3, MSH6*, MUTYH*, NBN, NF1*, NF2, NTHL1, PALB2*, PHOX2B, PMS2*, POT1, PRKAR1A, PTCH1, PTEN*, RAD51C*, RAD51D*, RB1, RECQL, RET, SDHA, SDHAF2, SDHB, SDHC, SDHD, SMAD4, SMARCA4, SMARCB1, SMARCE1, STK11, SUFU, TMEM127, TP53*,  TSC1, TSC2, VHL and XRCC2 (sequencing and deletion/duplication); EGFR, EGLN1, HOXB13, KIT, MITF, PDGFRA, POLD1, and POLE (sequencing only); EPCAM and GREM1 (deletion/duplication only). DNA and RNA analyses performed for * genes.     05/25/2022 Progression   Rising CA 19-9, CT CAP IMPRESSION: 1. Multiple new and enlarged hypodense, rim enhancing liver metastases. 2. Unchanged, ill-defined hypoenhancing mass of the pancreatic uncinate, consistent with pancreatic adenocarcinoma 3. Unchanged small prominent portacaval and gastrohepatic ligament nodes, modestly suspicious for nodal metastases. No overtly enlarged lymph nodes in the abdomen or pelvis. 4. Prostatomegaly.    06/05/2022 -  Chemotherapy   Patient is on Treatment Plan : PANCREATIC Abraxane D1,8,15 + Gemcitabine D1,8,15 q28d        INTERVAL HISTORY:  Steve Mills is here for a follow up of metastatic pancreatic cancer   He was last seen by me on 10/16/2022. He presents to the clinic accompanied by wife. Pt state last treatment went well he ate better and felt better. Pt state he still has neuropathy no change in better or getting worse. Pt state he did get nauseous from the flushing of the port today.  All other systems were reviewed with the patient and are negative.  MEDICAL HISTORY:  Past Medical History:  Diagnosis Date   Family history of adverse reaction to anesthesia    mother allergic to ether    Family history of breast cancer    Family history of colon cancer    Pneumonia    hx of 2014     SURGICAL HISTORY: Past Surgical History:  Procedure Laterality Date   APPENDECTOMY     CHOLECYSTECTOMY N/A 07/12/2018   Procedure: LAPAROSCOPIC CHOLECYSTECTOMY WITH INTRAOPERATIVE CHOLANGIOGRAM;  Surgeon: Johnathan Hausen, MD;  Location: [facility];  Service: General;  Laterality: N/A;   left wrist surgery      has plate in arm    NASAL POLYP SURGERY  2017   PORTACATH PLACEMENT N/A 03/10/2022   Procedure: INSERTION PORT-A-CATH;  Surgeon: Dwan Bolt, MD;  Location: [place];  Service: General;  Laterality: N/A;   VENTRAL HERNIA REPAIR N/A 07/15/2014   Procedure: LAPAROSCOPIC REPAIR INCARCARTED UMBILICAL  VENTRAL HERNIA, ;  Surgeon: Fanny Skates, MD;   Location: [facility];  Service: General;  Laterality: N/A;  With MESH    I have reviewed the social history and family history with the patient and they are unchanged from previous note.  ALLERGIES:  has No Known Allergies.  MEDICATIONS:  Current Outpatient Medications  Medication Sig Dispense Refill   ALPRAZolam (XANAX) 0.25 MG tablet Take 1 tablet (0.25 mg total) by mouth 2 (two) times daily as needed for anxiety (and nausea). (Patient not taking: Reported on 10/03/2022) 20 tablet 0   amoxicillin-clavulanate (AUGMENTIN) 875-125 MG tablet Take 1 tablet by mouth 2 (two) times daily. (Patient not taking: Reported on 10/03/2022) 14 tablet 0   lactobacillus acidophilus & bulgar (LACTINEX) chewable tablet CHEW 1 TABLET BY MOUTH 3 (THREE) TIMES DAILY WITH MEALS. 40 tablet 0   lidocaine-prilocaine (EMLA) cream Apply 1 Application topically as needed. 30 g 2   No current facility-administered medications for this visit.   Facility-Administered Medications Ordered in Other Visits  Medication Dose Route Frequency Provider Last Rate Last Admin   dextrose 5 % solution   Intravenous Continuous Truitt Merle, MD       gemcitabine (GEMZAR) 2,394 mg in sodium chloride 0.9 % 250 mL chemo infusion  1,000 mg/m2 (Treatment Plan Recorded) Intravenous Once Truitt Merle, MD  PACLitaxel-protein bound (ABRAXANE) chemo infusion 250 mg  100 mg/m2 (Treatment Plan Recorded) Intravenous Once Truitt Merle, MD        PHYSICAL EXAMINATION: ECOG PERFORMANCE STATUS: 1 - Symptomatic but completely ambulatory  Vitals:   11/06/22 1119  BP: (!) 141/102  Pulse: 65  Resp: 16  Temp: 98.6 F (37 C)  SpO2: 97%   Wt Readings from Last 3 Encounters:  11/06/22 265 lb 14.4 oz (120.6 kg)  10/23/22 260 lb 3 oz (118 kg)  10/16/22 261 lb 12.8 oz (118.8 kg)     GENERAL:alert, no distress and comfortable SKIN: skin color normal, no rashes or significant lesions EYES: normal, Conjunctiva are pink and non-injected, sclera clear  NEURO:  alert & oriented x 3 with fluent speech   LABORATORY DATA:  I have reviewed the data as listed    Latest Ref Rng & Units 11/06/2022   11:01 AM 10/23/2022   12:46 PM 10/16/2022   10:12 AM  CBC  WBC 4.0 - 10.5 K/uL 7.9  5.7  7.7   Hemoglobin 13.0 - 17.0 g/dL 12.1  11.3  12.6   Hematocrit 39.0 - 52.0 % 35.8  33.1  36.6   Platelets 150 - 400 K/uL 355  303  385         Latest Ref Rng & Units 11/06/2022   11:01 AM 10/23/2022   12:46 PM 10/16/2022   10:12 AM  CMP  Glucose 70 - 99 mg/dL 115  114  96   BUN 6 - 20 mg/dL 12  12  13    Creatinine 0.61 - 1.24 mg/dL 0.80  0.79  0.88   Sodium 135 - 145 mmol/L 138  139  139   Potassium 3.5 - 5.1 mmol/L 4.5  3.8  4.1   Chloride 98 - 111 mmol/L 106  105  107   CO2 22 - 32 mmol/L 27  28  26    Calcium 8.9 - 10.3 mg/dL 9.2  9.3  8.7   Total Protein 6.5 - 8.1 g/dL 6.8  6.9  6.9   Total Bilirubin 0.3 - 1.2 mg/dL 0.4  0.3  0.3   Alkaline Phos 38 - 126 U/L 71  76  79   AST 15 - 41 U/L 21  35  23   ALT 0 - 44 U/L 25  45  35       RADIOGRAPHIC STUDIES: I have personally reviewed the radiological images as listed and agreed with the findings in the report. No results found.    Orders Placed This Encounter  Procedures   CT CHEST ABDOMEN PELVIS W CONTRAST    Standing Status:   Future    Standing Expiration Date:   11/06/2023    Order Specific Question:   Preferred imaging location?    Answer:   [facility]    Order Specific Question:   Release to patient    Answer:   Immediate    Order Specific Question:   Is Oral Contrast requested for this exam?    Answer:   Yes, Per Radiology protocol   All questions were answered. The patient knows to call the clinic with any problems, questions or concerns. No barriers to learning was detected. The total time spent in the appointment was 30 minutes.     Truitt Merle, MD 11/06/2022   Felicity Coyer, CMA, am acting as scribe for Truitt Merle, MD.   I have reviewed the above documentation for  accuracy and completeness, and I  agree with the above.

## 2022-11-07 LAB — CANCER ANTIGEN 19-9: CA 19-9: 48 U/mL — ABNORMAL HIGH (ref 0–35)

## 2022-11-13 ENCOUNTER — Other Ambulatory Visit: Payer: Self-pay

## 2022-11-13 ENCOUNTER — Inpatient Hospital Stay: Payer: BC Managed Care – PPO

## 2022-11-13 VITALS — BP 159/96 | HR 62 | Temp 98.5°F | Resp 16 | Ht 72.0 in | Wt 264.6 lb

## 2022-11-13 DIAGNOSIS — C250 Malignant neoplasm of head of pancreas: Secondary | ICD-10-CM | POA: Diagnosis not present

## 2022-11-13 DIAGNOSIS — G620 Drug-induced polyneuropathy: Secondary | ICD-10-CM | POA: Diagnosis not present

## 2022-11-13 DIAGNOSIS — C259 Malignant neoplasm of pancreas, unspecified: Secondary | ICD-10-CM

## 2022-11-13 DIAGNOSIS — Z5111 Encounter for antineoplastic chemotherapy: Secondary | ICD-10-CM | POA: Diagnosis not present

## 2022-11-13 DIAGNOSIS — C787 Secondary malignant neoplasm of liver and intrahepatic bile duct: Secondary | ICD-10-CM | POA: Diagnosis not present

## 2022-11-13 DIAGNOSIS — Z95828 Presence of other vascular implants and grafts: Secondary | ICD-10-CM

## 2022-11-13 LAB — CMP (CANCER CENTER ONLY)
ALT: 35 U/L (ref 0–44)
AST: 29 U/L (ref 15–41)
Albumin: 3.8 g/dL (ref 3.5–5.0)
Alkaline Phosphatase: 71 U/L (ref 38–126)
Anion gap: 6 (ref 5–15)
BUN: 16 mg/dL (ref 6–20)
CO2: 26 mmol/L (ref 22–32)
Calcium: 9 mg/dL (ref 8.9–10.3)
Chloride: 108 mmol/L (ref 98–111)
Creatinine: 0.82 mg/dL (ref 0.61–1.24)
GFR, Estimated: >60 mL/min (ref >60)
Glucose, Bld: 103 mg/dL — ABNORMAL HIGH (ref 70–99)
Potassium: 4.2 mmol/L (ref 3.5–5.1)
Sodium: 140 mmol/L (ref 135–145)
Total Bilirubin: 0.3 mg/dL (ref 0.3–1.2)
Total Protein: 6.5 g/dL (ref 6.5–8.1)

## 2022-11-13 LAB — CBC WITH DIFFERENTIAL (CANCER CENTER ONLY)
Abs Immature Granulocytes: 0.6 10*3/uL — ABNORMAL HIGH (ref 0.00–0.07)
Basophils Absolute: 0.1 10*3/uL (ref 0.0–0.1)
Basophils Relative: 1 %
Eosinophils Absolute: 0.1 10*3/uL (ref 0.0–0.5)
Eosinophils Relative: 2 %
HCT: 32.4 % — ABNORMAL LOW (ref 39.0–52.0)
Hemoglobin: 11.1 g/dL — ABNORMAL LOW (ref 13.0–17.0)
Immature Granulocytes: 8 %
Lymphocytes Relative: 35 %
Lymphs Abs: 2.6 10*3/uL (ref 0.7–4.0)
MCH: 31.2 pg (ref 26.0–34.0)
MCHC: 34.3 g/dL (ref 30.0–36.0)
MCV: 91 fL (ref 80.0–100.0)
Monocytes Absolute: 1.1 10*3/uL — ABNORMAL HIGH (ref 0.1–1.0)
Monocytes Relative: 14 %
Neutro Abs: 3 10*3/uL (ref 1.7–7.7)
Neutrophils Relative %: 40 %
Platelet Count: 304 10*3/uL (ref 150–400)
RBC: 3.56 MIL/uL — ABNORMAL LOW (ref 4.22–5.81)
RDW: 15.2 % (ref 11.5–15.5)
WBC Count: 7.5 10*3/uL (ref 4.0–10.5)
nRBC: 0.5 % — ABNORMAL HIGH (ref 0.0–0.2)

## 2022-11-13 MED ORDER — PROCHLORPERAZINE MALEATE 10 MG PO TABS
10.0000 mg | ORAL_TABLET | Freq: Once | ORAL | Status: AC
  Administered 2022-11-13: 10 mg via ORAL
  Filled 2022-11-13: qty 1

## 2022-11-13 MED ORDER — HEPARIN SOD (PORK) LOCK FLUSH 100 UNIT/ML IV SOLN
500.0000 [IU] | Freq: Once | INTRAVENOUS | Status: AC | PRN
  Administered 2022-11-13: 500 [IU]

## 2022-11-13 MED ORDER — SODIUM CHLORIDE 0.9% FLUSH
10.0000 mL | INTRAVENOUS | Status: DC | PRN
  Administered 2022-11-13: 10 mL

## 2022-11-13 MED ORDER — SODIUM CHLORIDE 0.9 % IV SOLN
1000.0000 mg/m2 | Freq: Once | INTRAVENOUS | Status: AC
  Administered 2022-11-13: 2394 mg via INTRAVENOUS
  Filled 2022-11-13: qty 62.96

## 2022-11-13 MED ORDER — DEXTROSE 5 % IV SOLN: INTRAVENOUS | Status: DC

## 2022-11-13 MED ORDER — SODIUM CHLORIDE 0.9% FLUSH
10.0000 mL | Freq: Once | INTRAVENOUS | Status: AC
  Administered 2022-11-13: 10 mL

## 2022-11-13 MED ORDER — PACLITAXEL PROTEIN-BOUND CHEMO INJECTION 100 MG
100.0000 mg/m2 | Freq: Once | INTRAVENOUS | Status: AC
  Administered 2022-11-13: 250 mg via INTRAVENOUS
  Filled 2022-11-13: qty 50

## 2022-11-13 MED ORDER — SODIUM CHLORIDE 0.9 % IV SOLN: Freq: Once | INTRAVENOUS | Status: AC

## 2022-11-13 NOTE — Patient Instructions (Signed)
[facility]  Discharge Instructions: Thank you for choosing [facility] to provide your oncology and hematology care.   If you have a lab appointment with the Cancer Center, please go directly to the Cancer Center and check in at the registration area.   Wear comfortable clothing and clothing appropriate for easy access to any Portacath or PICC line.   We strive to give you quality time with your provider. You may need to reschedule your appointment if you arrive late (15 or more minutes).  Arriving late affects you and other patients whose appointments are after yours.  Also, if you miss three or more appointments without notifying the office, you may be dismissed from the clinic at the provider's discretion.      For prescription refill requests, have your pharmacy contact our office and allow 72 hours for refills to be completed.    Today you received the following chemotherapy and/or immunotherapy agents: Paclitaxel - protein bound (Abraxane) and Gemcitabine.   To help prevent nausea and vomiting after your treatment, we encourage you to take your nausea medication as directed.  BELOW ARE SYMPTOMS THAT SHOULD BE REPORTED IMMEDIATELY: *FEVER GREATER THAN 100.4 F (38 C) OR HIGHER *CHILLS OR SWEATING *NAUSEA AND VOMITING THAT IS NOT CONTROLLED WITH YOUR NAUSEA MEDICATION *UNUSUAL SHORTNESS OF BREATH *UNUSUAL BRUISING OR BLEEDING *URINARY PROBLEMS (pain or burning when urinating, or frequent urination) *BOWEL PROBLEMS (unusual diarrhea, constipation, pain near the anus) TENDERNESS IN MOUTH AND THROAT WITH OR WITHOUT PRESENCE OF ULCERS (sore throat, sores in mouth, or a toothache) UNUSUAL RASH, SWELLING OR PAIN  UNUSUAL VAGINAL DISCHARGE OR ITCHING   Items with * indicate a potential emergency and should be followed up as soon as possible or go to the Emergency Department if any problems should occur.  Please show the CHEMOTHERAPY ALERT  CARD or IMMUNOTHERAPY ALERT CARD at check-in to the Emergency Department and triage nurse.  Should you have questions after your visit or need to cancel or reschedule your appointment, please contact [facility]  Dept: [phone]  and follow the prompts.  Office hours are 8:00 a.m. to 4:30 p.m. Monday - Friday. Please note that voicemails left after 4:00 p.m. may not be returned until the following business day.  We are closed weekends and major holidays. You have access to a nurse at all times for urgent questions. Please call the main number to the clinic Dept: [phone] and follow the prompts.   For any non-urgent questions, you may also contact your provider using MyChart. We now offer e-Visits for anyone 18 and older to request care online for non-urgent symptoms. For details visit [email or web address].   Also download the MyChart app! Go to the app store, search "MyChart", open the app, select [facility], and log in with your MyChart username and password.  Paclitaxel Nanoparticle Albumin-Bound Injection What is this medication? NANOPARTICLE ALBUMIN-BOUND PACLITAXEL (Na no PAHR ti kuhl al BYOO muhn-bound PAK li TAX el) treats some types of cancer. It works by slowing down the growth of cancer cells. This medicine may be used for other purposes; ask your health care provider or pharmacist if you have questions. COMMON BRAND NAME(S): Abraxane What should I tell my care team before I take this medication? They need to know if you have any of these conditions: Liver disease Low white blood cell levels An unusual or allergic reaction to paclitaxel, albumin, other medications, foods, dyes,   or preservatives If you or your partner are pregnant or trying to get pregnant Breast-feeding How should I use this medication? This medication is injected into a vein. It is given by your care team in a hospital or clinic setting. Talk to your care team about  the use of this medication in children. Special care may be needed. Overdosage: If you think you have taken too much of this medicine contact a poison control center or emergency room at once. NOTE: This medicine is only for you. Do not share this medicine with others. What if I miss a dose? Keep appointments for follow-up doses. It is important not to miss your dose. Call your care team if you are unable to keep an appointment. What may interact with this medication? Other medications may affect the way this medication works. Talk with your care team about all of the medications you take. They may suggest changes to your treatment plan to lower the risk of side effects and to make sure your medications work as intended. This list may not describe all possible interactions. Give your health care provider a list of all the medicines, herbs, non-prescription drugs, or dietary supplements you use. Also tell them if you smoke, drink alcohol, or use illegal drugs. Some items may interact with your medicine. What should I watch for while using this medication? Your condition will be monitored carefully while you are receiving this medication. You may need blood work while taking this medication. This medication may make you feel generally unwell. This is not uncommon as chemotherapy can affect healthy cells as well as cancer cells. Report any side effects. Continue your course of treatment even though you feel ill unless your care team tells you to stop. This medication can cause serious allergic reactions. To reduce the risk, your care team may give you other medications to take before receiving this one. Be sure to follow the directions from your care team. This medication may increase your risk of getting an infection. Call your care team for advice if you get a fever, chills, sore throat, or other symptoms of a cold or flu. Do not treat yourself. Try to avoid being around people who are sick. This  medication may increase your risk to bruise or bleed. Call your care team if you notice any unusual bleeding. Be careful brushing or flossing your teeth or using a toothpick because you may get an infection or bleed more easily. If you have any dental work done, tell your dentist you are receiving this medication. Talk to your care team if you or your partner may be pregnant. Serious birth defects can occur if you take this medication during pregnancy and for 6 months after the last dose. You will need a negative pregnancy test before starting this medication. Contraception is recommended while taking this medication and for 6 months after the last dose. Your care team can help you find the option that works for you. If your partner can get pregnant, use a condom during sex while taking this medication and for 3 months after the last dose. Do not breastfeed while taking this medication and for 2 weeks after the last dose. This medication may cause infertility. Talk to your care team if you are concerned about your fertility. What side effects may I notice from receiving this medication? Side effects that you should report to your care team as soon as possible: Allergic reactions--skin rash, itching, hives, swelling of the face, lips, tongue, or throat   Dry cough, shortness of breath or trouble breathing Infection--fever, chills, cough, sore throat, wounds that don't heal, pain or trouble when passing urine, general feeling of discomfort or being unwell Low red blood cell level--unusual weakness or fatigue, dizziness, headache, trouble breathing Pain, tingling, or numbness in the hands or feet Stomach pain, unusual weakness or fatigue, nausea, vomiting, diarrhea, or fever that lasts longer than expected Unusual bruising or bleeding Side effects that usually do not require medical attention (report to your care team if they continue or are bothersome): Diarrhea Fatigue Hair loss Loss of  appetite Nausea Vomiting This list may not describe all possible side effects. Call your doctor for medical advice about side effects. You may report side effects to FDA at 1-800-FDA-1088. Where should I keep my medication? This medication is given in a hospital or clinic. It will not be stored at home. NOTE: This sheet is a summary. It may not cover all possible information. If you have questions about this medicine, talk to your doctor, pharmacist, or health care provider.  2023 Elsevier/Gold Standard (2007-09-28 00:00:00)  Gemcitabine Injection What is this medication? GEMCITABINE (jem SYE ta been) treats some types of cancer. It works by slowing down the growth of cancer cells. This medicine may be used for other purposes; ask your health care provider or pharmacist if you have questions. COMMON BRAND NAME(S): Gemzar, Infugem What should I tell my care team before I take this medication? They need to know if you have any of these conditions: Blood disorders Infection Kidney disease Liver disease Lung or breathing disease, such as asthma or COPD Recent or ongoing radiation therapy An unusual or allergic reaction to gemcitabine, other medications, foods, dyes, or preservatives If you or your partner are pregnant or trying to get pregnant Breast-feeding How should I use this medication? This medication is injected into a vein. It is given by your care team in a hospital or clinic setting. Talk to your care team about the use of this medication in children. Special care may be needed. Overdosage: If you think you have taken too much of this medicine contact a poison control center or emergency room at once. NOTE: This medicine is only for you. Do not share this medicine with others. What if I miss a dose? Keep appointments for follow-up doses. It is important not to miss your dose. Call your care team if you are unable to keep an appointment. What may interact with this  medication? Interactions have not been studied. This list may not describe all possible interactions. Give your health care provider a list of all the medicines, herbs, non-prescription drugs, or dietary supplements you use. Also tell them if you smoke, drink alcohol, or use illegal drugs. Some items may interact with your medicine. What should I watch for while using this medication? Your condition will be monitored carefully while you are receiving this medication. This medication may make you feel generally unwell. This is not uncommon, as chemotherapy can affect healthy cells as well as cancer cells. Report any side effects. Continue your course of treatment even though you feel ill unless your care team tells you to stop. In some cases, you may be given additional medications to help with side effects. Follow all directions for their use. This medication may increase your risk of getting an infection. Call your care team for advice if you get a fever, chills, sore throat, or other symptoms of a cold or flu. Do not treat yourself. Try to avoid   being around people who are sick. This medication may increase your risk to bruise or bleed. Call your care team if you notice any unusual bleeding. Be careful brushing or flossing your teeth or using a toothpick because you may get an infection or bleed more easily. If you have any dental work done, tell your dentist you are receiving this medication. Avoid taking medications that contain aspirin, acetaminophen, ibuprofen, naproxen, or ketoprofen unless instructed by your care team. These medications may hide a fever. Talk to your care team if you or your partner wish to become pregnant or think you might be pregnant. This medication can cause serious birth defects if taken during pregnancy and for 6 months after the last dose. A negative pregnancy test is required before starting this medication. A reliable form of contraception is recommended while taking  this medication and for 6 months after the last dose. Talk to your care team about effective forms of contraception. Do not father a child while taking this medication and for 3 months after the last dose. Use a condom while having sex during this time period. Do not breastfeed while taking this medication and for at least 1 week after the last dose. This medication may cause infertility. Talk to your care team if you are concerned about your fertility. What side effects may I notice from receiving this medication? Side effects that you should report to your care team as soon as possible: Allergic reactions--skin rash, itching, hives, swelling of the face, lips, tongue, or throat Capillary leak syndrome--stomach or muscle pain, unusual weakness or fatigue, feeling faint or lightheaded, decrease in the amount of urine, swelling of the ankles, hands, or feet, trouble breathing Infection--fever, chills, cough, sore throat, wounds that don't heal, pain or trouble when passing urine, general feeling of discomfort or being unwell Liver injury--right upper belly pain, loss of appetite, nausea, light-colored stool, dark yellow or brown urine, yellowing skin or eyes, unusual weakness or fatigue Low red blood cell level--unusual weakness or fatigue, dizziness, headache, trouble breathing Lung injury--shortness of breath or trouble breathing, cough, spitting up blood, chest pain, fever Stomach pain, bloody diarrhea, pale skin, unusual weakness or fatigue, decrease in the amount of urine, which may be signs of hemolytic uremic syndrome Sudden and severe headache, confusion, change in vision, seizures, which may be signs of posterior reversible encephalopathy syndrome (PRES) Unusual bruising or bleeding Side effects that usually do not require medical attention (report to your care team if they continue or are bothersome): Diarrhea Drowsiness Hair loss Nausea Pain, redness, or swelling with sores inside the  mouth or throat Vomiting This list may not describe all possible side effects. Call your doctor for medical advice about side effects. You may report side effects to FDA at 1-800-FDA-1088. Where should I keep my medication? This medication is given in a hospital or clinic. It will not be stored at home. NOTE: This sheet is a summary. It may not cover all possible information. If you have questions about this medicine, talk to your doctor, pharmacist, or health care provider.  2023 Elsevier/Gold Standard (2021-12-13 00:00:00)     

## 2022-11-21 ENCOUNTER — Other Ambulatory Visit: Payer: Self-pay

## 2022-11-24 ENCOUNTER — Ambulatory Visit (HOSPITAL_COMMUNITY)
Admission: RE | Admit: 2022-11-24 | Discharge: 2022-11-24 | Disposition: A | Discharge status: Home / Self Care | Payer: BC Managed Care – PPO | Source: Ambulatory Visit | Attending: Hematology | Admitting: Hematology

## 2022-11-24 DIAGNOSIS — K769 Liver disease, unspecified: Secondary | ICD-10-CM | POA: Diagnosis not present

## 2022-11-24 DIAGNOSIS — Z8507 Personal history of malignant neoplasm of pancreas: Secondary | ICD-10-CM | POA: Diagnosis not present

## 2022-11-24 DIAGNOSIS — C787 Secondary malignant neoplasm of liver and intrahepatic bile duct: Secondary | ICD-10-CM | POA: Insufficient documentation

## 2022-11-24 DIAGNOSIS — C259 Malignant neoplasm of pancreas, unspecified: Secondary | ICD-10-CM | POA: Diagnosis not present

## 2022-11-24 MED ORDER — SODIUM CHLORIDE (PF) 0.9 % IJ SOLN
INTRAMUSCULAR | Status: AC
  Filled 2022-11-24: qty 50

## 2022-11-24 MED ORDER — IOHEXOL 350 MG/ML SOLN
100.0000 mL | Freq: Once | INTRAVENOUS | Status: AC | PRN
  Administered 2022-11-24: 100 mL via INTRAVENOUS

## 2022-11-26 NOTE — Progress Notes (Incomplete)
Patient Care Team: Daisy Florooss, Charles Alan, MD as PCP - General (Family Medicine) Malachy MoodFeng, Yan, MD as Consulting Physician (Oncology) Kathrynn DuckingZani, Revonda StandardSabino Jr., MD as Consulting Physician (General Surgery)   CHIEF COMPLAINT: Follow up metastatic pancreatic cancer   Oncology History Overview Note   Cancer Staging  Pancreatic cancer metastasized to liver [facility]HCC) Staging form: Exocrine Pancreas, AJCC 8th Edition - Clinical stage from 02/28/2022: Stage IV (cT2, cN1, pM1) - Signed by Malachy MoodFeng, Yan, MD on 03/02/2022 Stage prefix: Initial diagnosis     Pancreatic cancer metastasized to liver  02/28/2022 Cancer Staging   Staging form: Exocrine Pancreas, AJCC 8th Edition - Clinical stage from 02/28/2022: Stage IV (cT2, cN1, pM1) - Signed by Malachy MoodFeng, Yan, MD on 03/02/2022 Stage prefix: Initial diagnosis   03/02/2022 Initial Diagnosis   Pancreatic cancer metastasized to liver (HCC)   03/13/2022 - 04/13/2022 Chemotherapy   Patient is on Treatment Plan : PANCREAS Modified FOLFIRINOX q14d x 4 cycles     03/13/2022 - 05/17/2022 Chemotherapy   Patient is on Treatment Plan : PANCREAS Modified FOLFIRINOX q14d x 4 cycles     03/18/2022 Genetic Testing   Negative genetic testing on the CancerNext-Expanded+RNAinsight panel.  APC [id number] VUs identified.  The report date is March 18, 2022.  The CancerNext-Expanded gene panel offered by [facility] and includes sequencing and rearrangement analysis for the following 77 genes: AIP, ALK, APC*, ATM*, AXIN2, BAP1, BARD1, BLM, BMPR1A, BRCA1*, BRCA2*, BRIP1*, CDC73, CDH1*, CDK4, CDKN1B, CDKN2A, CHEK2*, CTNNA1, DICER1, FANCC, FH, FLCN, GALNT12, KIF1B, LZTR1, MAX, MEN1, MET, MLH1*, MSH2*, MSH3, MSH6*, MUTYH*, NBN, NF1*, NF2, NTHL1, PALB2*, PHOX2B, PMS2*, POT1, PRKAR1A, PTCH1, PTEN*, RAD51C*, RAD51D*, RB1, RECQL, RET, SDHA, SDHAF2, SDHB, SDHC, SDHD, SMAD4, SMARCA4, SMARCB1, SMARCE1, STK11, SUFU, TMEM127, TP53*, TSC1, TSC2, VHL and XRCC2 (sequencing and deletion/duplication); EGFR, EGLN1,  HOXB13, KIT, MITF, PDGFRA, POLD1, and POLE (sequencing only); EPCAM and GREM1 (deletion/duplication only). DNA and RNA analyses performed for * genes.    05/25/2022 Progression   Rising CA 19-9, CT CAP IMPRESSION: 1. Multiple new and enlarged hypodense, rim enhancing liver metastases. 2. Unchanged, ill-defined hypoenhancing mass of the pancreatic uncinate, consistent with pancreatic adenocarcinoma 3. Unchanged small prominent portacaval and gastrohepatic ligament nodes, modestly suspicious for nodal metastases. No overtly enlarged lymph nodes in the abdomen or pelvis. 4. Prostatomegaly.    06/05/2022 - 11/13/2022 Chemotherapy   Patient is on Treatment Plan : PANCREATIC Abraxane D1,8,15 + Gemcitabine D1,8,15 q28d     11/24/2022 Imaging   IMPRESSION: 1. Today's study demonstrates mild progression of disease as evidenced by slight enlargement of the mass in the uncinate process of the pancreas which demonstrates potential direct invasion into the superior wall of the third portion of the duodenum, but currently demonstrates no definite vascular involvement. Multiple hepatic lesions are generally stable to the prior study, with exception of enlargement of a lesion in segment 8, as detailed above. 2. No new sites of metastatic disease are noted elsewhere in the chest or pelvis. 3. Aortic atherosclerosis.  Rising CA 19-9    12/04/2022 -  Chemotherapy   Patient is on Treatment Plan : PANCREAS Liposomal Irinotecan + Leucovorin + 5-FU IVCI q14d        CURRENT THERAPY: Second line gemcitabine/abraxane days 1 + 8, q21 days, starting 06/05/22   INTERVAL HISTORY Mr. Quintella BatonBuckner returns for follow up and treatment as scheduled. Last seen by Dr. Mosetta PuttFeng 11/06/22 and completed C8 Gem/abraxane.  He is tired for a few days after treatment, but tolerated last cycle very well  and continued working.  Eating and drinking well.  Denies abdominal pain/bloating, nausea/vomiting.  When he has bowel movements it feels urgent,  like he cannot stop it at times but is not incontinent.  He also notes if he is constipated he cannot push very well because its almost like his colon is "numb."  Peripheral neuropathy is stable, remains functional.  ROS  All other systems reviewed and negative  Past Medical History:  Diagnosis Date   Family history of adverse reaction to anesthesia    mother allergic to ether    Family history of breast cancer    Family history of colon cancer    Pneumonia    hx of 2014      Past Surgical History:  Procedure Laterality Date   APPENDECTOMY     CHOLECYSTECTOMY N/A 07/12/2018   Procedure: LAPAROSCOPIC CHOLECYSTECTOMY WITH INTRAOPERATIVE CHOLANGIOGRAM;  Surgeon: Luretha Murphy, MD;  Location: [facility];  Service: General;  Laterality: N/A;   left wrist surgery      has plate in arm    NASAL POLYP SURGERY  2017   PORTACATH PLACEMENT N/A 03/10/2022   Procedure: INSERTION PORT-A-CATH;  Surgeon: Fritzi Mandes, MD;  Location: [facility];  Service: General;  Laterality: N/A;   VENTRAL HERNIA REPAIR N/A 07/15/2014   Procedure: LAPAROSCOPIC REPAIR INCARCARTED UMBILICAL  VENTRAL HERNIA, ;  Surgeon: Claud Kelp, MD;  Location: [facility];  Service: General;  Laterality: N/A;  With MESH     Outpatient Encounter Medications as of 11/27/2022  Medication Sig   ALPRAZolam (XANAX) 0.25 MG tablet Take 1 tablet (0.25 mg total) by mouth 2 (two) times daily as needed for anxiety (and nausea). (Patient not taking: Reported on 10/03/2022)   lactobacillus acidophilus & bulgar (LACTINEX) chewable tablet CHEW 1 TABLET BY MOUTH 3 (THREE) TIMES DAILY WITH MEALS.   lidocaine-prilocaine (EMLA) cream Apply 1 Application topically as needed.   [DISCONTINUED] amoxicillin-clavulanate (AUGMENTIN) 875-125 MG tablet Take 1 tablet by mouth 2 (two) times daily. (Patient not taking: Reported on 10/03/2022)   [DISCONTINUED] prochlorperazine (COMPAZINE) 10 MG tablet Take 1 tablet (10 mg total) by mouth every 6 (six) hours as needed  (Nausea or vomiting).   Facility-Administered Encounter Medications as of 11/27/2022  Medication   [COMPLETED] heparin lock flush 100 unit/mL   sodium chloride flush (NS) 0.9 % injection 10 mL     Today's Vitals   11/27/22 1123  BP: 131/81  Pulse: 67  Resp: 13  Temp: 98.3 F (36.8 C)  TempSrc: Oral  SpO2: 96%  Weight: 260 lb 12.8 oz (118.3 kg)   Body mass index is 35.37 kg/m.   PHYSICAL EXAM GENERAL:alert, no distress and comfortable SKIN: no rash  EYES: sclera clear NECK: without mass LYMPH:  no palpable cervical or supraclavicular lymphadenopathy  LUNGS: clear with normal breathing effort HEART: regular rate & rhythm, no lower extremity edema ABDOMEN: abdomen soft, non-tender and normal bowel sounds NEURO: alert & oriented x 3 with fluent speech, no focal motor/sensory deficits PAC without erythema    CBC    Component Value Date/Time   WBC 6.9 11/27/2022 1048   WBC 10.4 08/14/2022 0019   RBC 3.93 (L) 11/27/2022 1048   HGB 12.2 (L) 11/27/2022 1048   HCT 35.7 (L) 11/27/2022 1048   PLT 399 11/27/2022 1048   MCV 90.8 11/27/2022 1048   MCH 31.0 11/27/2022 1048   MCHC 34.2 11/27/2022 1048   RDW 16.2 (H) 11/27/2022 1048   LYMPHSABS 2.1 11/27/2022 1048   MONOABS  0.9 11/27/2022 1048   EOSABS 0.5 11/27/2022 1048   BASOSABS 0.1 11/27/2022 1048     CMP     Component Value Date/Time   NA 139 11/27/2022 1048   K 4.4 11/27/2022 1048   CL 106 11/27/2022 1048   CO2 26 11/27/2022 1048   GLUCOSE 136 (H) 11/27/2022 1048   BUN 11 11/27/2022 1048   CREATININE 0.92 11/27/2022 1048   CALCIUM 9.6 11/27/2022 1048   PROT 6.8 11/27/2022 1048   ALBUMIN 4.0 11/27/2022 1048   AST 20 11/27/2022 1048   ALT 22 11/27/2022 1048   ALKPHOS 69 11/27/2022 1048   BILITOT 0.5 11/27/2022 1048   GFRNONAA >60 11/27/2022 1048   GFRAA >60 07/12/2018 0448     ASSESSMENT & PLAN:Aceton D Chesnut is a 57 y.o. male with    1. Pancreatic Head Adenocarcinoma, metastatic to liver, stage IV, MMR  normal  -incidental finding on lung cancer screening chest CT on 01/19/22. 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  -liver biopsy on 02/28/22 confirmed poorly differentiated adenocarcinoma, compatible with clinical suspicion of pancreatic adenocarcinoma. MMR normal, not a candidate for immunotherapy.  -FO showed KRAS mutation, and MSS, no immunotherapy or targeted therapy available. BRCA1/2 was negative, not a candidate for PARP inhibitor  -He completed 5 cycles first-line palliative FOLFIRINOX starting 03/13/22, but unfortunately he did not respond and CA 19-9 increased on treatment.  -He switched to second line gemcitabine/abraxane on 06/05/22, tolerating moderately well -Restaging CT 08/24/22 showed partial response, CA 19-9 normalized  -He met with Dr. [name] at [facility], not a surgical candidate due to metastatic disease in the liver (at least 5 lesions) -Mr. Potempa appears stable. He completed 8 cycles of gemcitabine/abraxane, tolerated well with mainly fatigue. Remained able to work and maintain good PS -We reviewed restaging CT from 4/5 which unfortunately showed progression in one of the liver mets, others are stable, and the primary tumor appears stable but with more duodenal invasion. The CA 19-9 has also increased recently. The overall picture is consistent with mild disease progression -Due to association with duodenum, we reviewed signs of obstruction including n/v, no BM, pain after eating. He will monitor -Pt seen with Dr. Mosetta Putt who recommends to stop gem/abraxane and proceed to third line FOLFIRI with liposomal irinotecan q14 days. We reviewed potential SE's including but not limited to fatigue, hair loss, diarrhea, cytopenias. He agrees to proceed. Treatment goal remains palliative -He understands the response rate is likely lower than first/second line therapies.  -We also encouraged  him to explore clinical trials and we will see if there are any at [facility]. I gave him some places to research on his own -Plan to start FOLFIRI/liposomal irinotecan later this week, or next week, once approved.  -F/up with cycle 2   2. Genetics -he has a strong family history of cancer, specifically liver and lung on the paternal side and breast and colon on the maternal side. -testing performed 03/08/22, results are negative.    PLAN: -Recent CT and today's labs reviewed -D/c gem/abraxane due to disease progression -Proceed with third line FOLFIRI with liposomal irinotecan q14 days, starting this week or next (once approved) -F/up with cycle 2 -Pt seen with Dr. Mosetta Putt -Explore clinical trial options  Orders Placed This Encounter  Procedures   CBC with Differential (Cancer Center Only)    Standing Status:   Future    Standing Expiration  Date:   12/04/2023   CMP (Cancer Center only)    Standing Status:   Future    Standing Expiration Date:   12/04/2023   CBC with Differential (Cancer Center Only)    Standing Status:   Future    Standing Expiration Date:   12/18/2023   CMP (Cancer Center only)    Standing Status:   Future    Standing Expiration Date:   12/18/2023   CBC with Differential (Cancer Center Only)    Standing Status:   Future    Standing Expiration Date:   01/01/2024   CMP (Cancer Center only)    Standing Status:   Future    Standing Expiration Date:   01/01/2024   CBC with Differential (Cancer Center Only)    Standing Status:   Future    Standing Expiration Date:   01/15/2024   CMP (Cancer Center only)    Standing Status:   Future    Standing Expiration Date:   01/15/2024      All questions were answered. The patient knows to call the clinic with any problems, questions or concerns. No barriers to learning were detected.   Santiago Glad, NP-C 11/27/2022  Attending addendum I have seen the patient, examined him. I agree with the assessment and and plan and have edited  the notes.   I personally reviewed his relevant staging CT scan imagings and discussed the findings with him and his wife.  Unfortunately he has had cancer progression in the liver, and probable in primary pancreatic cancer, his tumor marker has increased lately also.  He has developed some neuropathy from previous chemo treatment.  I recommended change treatment to 5-FU and liposomal irinotecan, potential benefit and side effect discussed with him in detail, he agrees to proceed.  He has limited treatment options down the road, I would check with Kirstie Mirza and [facility] to see if there are suitable clinical trial options for him.  He is open to clinical trial.  All questions were answered.  Plan to start new chemo next week.  [name] MD 11/27/2022

## 2022-11-27 ENCOUNTER — Inpatient Hospital Stay (HOSPITAL_BASED_OUTPATIENT_CLINIC_OR_DEPARTMENT_OTHER): Payer: BC Managed Care – PPO | Admitting: Nurse Practitioner

## 2022-11-27 ENCOUNTER — Encounter: Payer: Self-pay | Admitting: Hematology

## 2022-11-27 ENCOUNTER — Inpatient Hospital Stay: Payer: BC Managed Care – PPO | Attending: Hematology

## 2022-11-27 ENCOUNTER — Other Ambulatory Visit: Payer: Self-pay

## 2022-11-27 ENCOUNTER — Encounter: Payer: Self-pay | Admitting: Nurse Practitioner

## 2022-11-27 ENCOUNTER — Inpatient Hospital Stay: Payer: BC Managed Care – PPO

## 2022-11-27 VITALS — BP 131/81 | HR 67 | Temp 98.3°F | Resp 13 | Wt 260.8 lb

## 2022-11-27 DIAGNOSIS — I700 Atherosclerosis of aorta: Secondary | ICD-10-CM | POA: Diagnosis not present

## 2022-11-27 DIAGNOSIS — Z803 Family history of malignant neoplasm of breast: Secondary | ICD-10-CM | POA: Insufficient documentation

## 2022-11-27 DIAGNOSIS — Z5111 Encounter for antineoplastic chemotherapy: Secondary | ICD-10-CM | POA: Insufficient documentation

## 2022-11-27 DIAGNOSIS — Z452 Encounter for adjustment and management of vascular access device: Secondary | ICD-10-CM | POA: Insufficient documentation

## 2022-11-27 DIAGNOSIS — C787 Secondary malignant neoplasm of liver and intrahepatic bile duct: Secondary | ICD-10-CM

## 2022-11-27 DIAGNOSIS — C250 Malignant neoplasm of head of pancreas: Secondary | ICD-10-CM | POA: Insufficient documentation

## 2022-11-27 DIAGNOSIS — C259 Malignant neoplasm of pancreas, unspecified: Secondary | ICD-10-CM

## 2022-11-27 DIAGNOSIS — Z800 Family history of malignant neoplasm of digestive organs: Secondary | ICD-10-CM | POA: Insufficient documentation

## 2022-11-27 DIAGNOSIS — G620 Drug-induced polyneuropathy: Secondary | ICD-10-CM | POA: Insufficient documentation

## 2022-11-27 DIAGNOSIS — Z95828 Presence of other vascular implants and grafts: Secondary | ICD-10-CM

## 2022-11-27 LAB — CBC WITH DIFFERENTIAL (CANCER CENTER ONLY)
Abs Immature Granulocytes: 0.02 10*3/uL (ref 0.00–0.07)
Basophils Absolute: 0.1 10*3/uL (ref 0.0–0.1)
Basophils Relative: 1 %
Eosinophils Absolute: 0.5 10*3/uL (ref 0.0–0.5)
Eosinophils Relative: 7 %
HCT: 35.7 % — ABNORMAL LOW (ref 39.0–52.0)
Hemoglobin: 12.2 g/dL — ABNORMAL LOW (ref 13.0–17.0)
Immature Granulocytes: 0 %
Lymphocytes Relative: 31 %
Lymphs Abs: 2.1 10*3/uL (ref 0.7–4.0)
MCH: 31 pg (ref 26.0–34.0)
MCHC: 34.2 g/dL (ref 30.0–36.0)
MCV: 90.8 fL (ref 80.0–100.0)
Monocytes Absolute: 0.9 10*3/uL (ref 0.1–1.0)
Monocytes Relative: 13 %
Neutro Abs: 3.3 10*3/uL (ref 1.7–7.7)
Neutrophils Relative %: 48 %
Platelet Count: 399 10*3/uL (ref 150–400)
RBC: 3.93 MIL/uL — ABNORMAL LOW (ref 4.22–5.81)
RDW: 16.2 % — ABNORMAL HIGH (ref 11.5–15.5)
WBC Count: 6.9 10*3/uL (ref 4.0–10.5)
nRBC: 0 % (ref 0.0–0.2)

## 2022-11-27 LAB — CMP (CANCER CENTER ONLY)
ALT: 22 U/L (ref 0–44)
AST: 20 U/L (ref 15–41)
Albumin: 4 g/dL (ref 3.5–5.0)
Alkaline Phosphatase: 69 U/L (ref 38–126)
Anion gap: 7 (ref 5–15)
BUN: 11 mg/dL (ref 6–20)
CO2: 26 mmol/L (ref 22–32)
Calcium: 9.6 mg/dL (ref 8.9–10.3)
Chloride: 106 mmol/L (ref 98–111)
Creatinine: 0.92 mg/dL (ref 0.61–1.24)
GFR, Estimated: >60 mL/min (ref >60)
Glucose, Bld: 136 mg/dL — ABNORMAL HIGH (ref 70–99)
Potassium: 4.4 mmol/L (ref 3.5–5.1)
Sodium: 139 mmol/L (ref 135–145)
Total Bilirubin: 0.5 mg/dL (ref 0.3–1.2)
Total Protein: 6.8 g/dL (ref 6.5–8.1)

## 2022-11-27 MED ORDER — SODIUM CHLORIDE 0.9% FLUSH
10.0000 mL | INTRAVENOUS | Status: DC | PRN
  Administered 2022-11-27: 10 mL via INTRAVENOUS

## 2022-11-27 MED ORDER — HEPARIN SOD (PORK) LOCK FLUSH 100 UNIT/ML IV SOLN
500.0000 [IU] | Freq: Once | INTRAVENOUS | Status: AC
  Administered 2022-11-27: 500 [IU] via INTRAVENOUS

## 2022-11-27 MED ORDER — SODIUM CHLORIDE 0.9% FLUSH
10.0000 mL | Freq: Once | INTRAVENOUS | Status: AC
  Administered 2022-11-27: 10 mL

## 2022-11-27 NOTE — Progress Notes (Signed)
DISCONTINUE ON PATHWAY REGIMEN - Pancreatic Adenocarcinoma     A cycle is every 28 days:     Nab-paclitaxel (protein bound)      Gemcitabine   **Always confirm dose/schedule in your pharmacy ordering system**  REASON: Disease Progression PRIOR TREATMENT: PANOS51: Nab-Paclitaxel (Abraxane) 125 mg/m2 D1, 8, 15 + Gemcitabine 1,000 mg/m2 D1, 8, 15 q28 Days Until Progression or Toxicity TREATMENT RESPONSE: Partial Response (PR)  START OFF PATHWAY REGIMEN - Pancreatic Adenocarcinoma   [id number]:Fluorouracil 1,200 mg/m2/day CIV D1-2 + Leucovorin 400 mg/m2 IV D1 + Liposomal Irinotecan 70 mg/m2 IV D1 q14 Days:   A cycle is every 14 days:     Liposomal irinotecan      Leucovorin      Fluorouracil   **Always confirm dose/schedule in your pharmacy ordering system**  Patient Characteristics: Metastatic Disease, Third Line and Beyond, MSS/pMMR or MSI Unknown Therapeutic Status: Metastatic Disease Line of Therapy: [employer] manager Status: MSS/pMMR Intent of Therapy: Non-Curative / Palliative Intent, Discussed with Patient

## 2022-11-27 NOTE — Progress Notes (Signed)
Pharmacist Chemotherapy Monitoring - Initial Assessment    Anticipated start date: 12/04/22   The following has been reviewed per standard work regarding the patient's treatment regimen: The patient's diagnosis, treatment plan and drug doses, and organ/hematologic function Lab orders and baseline tests specific to treatment regimen  The treatment plan start date, drug sequencing, and pre-medications Prior authorization status  Patient's documented medication list, including drug-drug interaction screen and prescriptions for anti-emetics and supportive care specific to the treatment regimen The drug concentrations, fluid compatibility, administration routes, and timing of the medications to be used The patient's access for treatment and lifetime cumulative dose history, if applicable  The patient's medication allergies and previous infusion related reactions, if applicable   Changes made to treatment plan:  N/A  Follow up needed:  Pending authorization for treatment    Ebony Hail, Pharm.D., CPP 11/27/2022@3 :57 PM

## 2022-11-28 ENCOUNTER — Other Ambulatory Visit: Payer: Self-pay

## 2022-12-01 MED FILL — Dexamethasone Sodium Phosphate Inj 100 MG/10ML: INTRAMUSCULAR | Qty: 1 | Status: AC

## 2022-12-04 ENCOUNTER — Inpatient Hospital Stay (HOSPITAL_BASED_OUTPATIENT_CLINIC_OR_DEPARTMENT_OTHER): Payer: BC Managed Care – PPO | Admitting: Hematology

## 2022-12-04 ENCOUNTER — Telehealth: Payer: Self-pay

## 2022-12-04 ENCOUNTER — Encounter: Payer: Self-pay | Admitting: Hematology

## 2022-12-04 ENCOUNTER — Inpatient Hospital Stay: Payer: BC Managed Care – PPO

## 2022-12-04 VITALS — BP 142/99 | HR 78 | Temp 98.2°F | Resp 14 | Wt 261.4 lb

## 2022-12-04 DIAGNOSIS — T451X5A Adverse effect of antineoplastic and immunosuppressive drugs, initial encounter: Secondary | ICD-10-CM

## 2022-12-04 DIAGNOSIS — C787 Secondary malignant neoplasm of liver and intrahepatic bile duct: Secondary | ICD-10-CM

## 2022-12-04 DIAGNOSIS — Z95828 Presence of other vascular implants and grafts: Secondary | ICD-10-CM

## 2022-12-04 DIAGNOSIS — Z5111 Encounter for antineoplastic chemotherapy: Secondary | ICD-10-CM | POA: Diagnosis not present

## 2022-12-04 DIAGNOSIS — Z803 Family history of malignant neoplasm of breast: Secondary | ICD-10-CM | POA: Diagnosis not present

## 2022-12-04 DIAGNOSIS — C259 Malignant neoplasm of pancreas, unspecified: Secondary | ICD-10-CM | POA: Diagnosis not present

## 2022-12-04 DIAGNOSIS — G620 Drug-induced polyneuropathy: Secondary | ICD-10-CM | POA: Diagnosis not present

## 2022-12-04 DIAGNOSIS — I700 Atherosclerosis of aorta: Secondary | ICD-10-CM | POA: Diagnosis not present

## 2022-12-04 DIAGNOSIS — Z800 Family history of malignant neoplasm of digestive organs: Secondary | ICD-10-CM | POA: Diagnosis not present

## 2022-12-04 DIAGNOSIS — Z452 Encounter for adjustment and management of vascular access device: Secondary | ICD-10-CM | POA: Diagnosis not present

## 2022-12-04 DIAGNOSIS — C250 Malignant neoplasm of head of pancreas: Secondary | ICD-10-CM | POA: Diagnosis not present

## 2022-12-04 LAB — CBC WITH DIFFERENTIAL (CANCER CENTER ONLY)
Abs Immature Granulocytes: 0.02 10*3/uL (ref 0.00–0.07)
Basophils Absolute: 0.1 10*3/uL (ref 0.0–0.1)
Basophils Relative: 1 %
Eosinophils Absolute: 0.5 10*3/uL (ref 0.0–0.5)
Eosinophils Relative: 7 %
HCT: 37.6 % — ABNORMAL LOW (ref 39.0–52.0)
Hemoglobin: 13 g/dL (ref 13.0–17.0)
Immature Granulocytes: 0 %
Lymphocytes Relative: 33 %
Lymphs Abs: 2.3 10*3/uL (ref 0.7–4.0)
MCH: 31 pg (ref 26.0–34.0)
MCHC: 34.6 g/dL (ref 30.0–36.0)
MCV: 89.7 fL (ref 80.0–100.0)
Monocytes Absolute: 0.9 10*3/uL (ref 0.1–1.0)
Monocytes Relative: 12 %
Neutro Abs: 3.2 10*3/uL (ref 1.7–7.7)
Neutrophils Relative %: 47 %
Platelet Count: 415 10*3/uL — ABNORMAL HIGH (ref 150–400)
RBC: 4.19 MIL/uL — ABNORMAL LOW (ref 4.22–5.81)
RDW: 15.5 % (ref 11.5–15.5)
WBC Count: 7 10*3/uL (ref 4.0–10.5)
nRBC: 0 % (ref 0.0–0.2)

## 2022-12-04 LAB — CMP (CANCER CENTER ONLY)
ALT: 19 U/L (ref 0–44)
AST: 22 U/L (ref 15–41)
Albumin: 4.1 g/dL (ref 3.5–5.0)
Alkaline Phosphatase: 74 U/L (ref 38–126)
Anion gap: 6 (ref 5–15)
BUN: 13 mg/dL (ref 6–20)
CO2: 26 mmol/L (ref 22–32)
Calcium: 9.6 mg/dL (ref 8.9–10.3)
Chloride: 106 mmol/L (ref 98–111)
Creatinine: 0.9 mg/dL (ref 0.61–1.24)
GFR, Estimated: >60 mL/min (ref >60)
Glucose, Bld: 139 mg/dL — ABNORMAL HIGH (ref 70–99)
Potassium: 4 mmol/L (ref 3.5–5.1)
Sodium: 138 mmol/L (ref 135–145)
Total Bilirubin: 0.6 mg/dL (ref 0.3–1.2)
Total Protein: 7.1 g/dL (ref 6.5–8.1)

## 2022-12-04 MED ORDER — DEXAMETHASONE 4 MG PO TABS
8.0000 mg | ORAL_TABLET | Freq: Every day | ORAL | 5 refills | Status: DC
Start: 2022-12-04 — End: 2023-03-12

## 2022-12-04 MED ORDER — SODIUM CHLORIDE 0.9 % IV SOLN
2400.0000 mg/m2 | INTRAVENOUS | Status: DC
  Administered 2022-12-04: 5900 mg via INTRAVENOUS
  Filled 2022-12-04: qty 118

## 2022-12-04 MED ORDER — PALONOSETRON HCL INJECTION 0.25 MG/5ML
0.2500 mg | Freq: Once | INTRAVENOUS | Status: AC
  Administered 2022-12-04: 0.25 mg via INTRAVENOUS
  Filled 2022-12-04: qty 5

## 2022-12-04 MED ORDER — LIDOCAINE-PRILOCAINE 2.5-2.5 % EX CREA
TOPICAL_CREAM | CUTANEOUS | 3 refills | Status: DC
Start: 2022-12-04 — End: 2023-01-08

## 2022-12-04 MED ORDER — SODIUM CHLORIDE 0.9 % IV SOLN
10.0000 mg | Freq: Once | INTRAVENOUS | Status: AC
  Administered 2022-12-04: 10 mg via INTRAVENOUS
  Filled 2022-12-04: qty 10

## 2022-12-04 MED ORDER — PROCHLORPERAZINE MALEATE 10 MG PO TABS
10.0000 mg | ORAL_TABLET | Freq: Four times a day (QID) | ORAL | 1 refills | Status: DC | PRN
Start: 2022-12-04 — End: 2023-09-25

## 2022-12-04 MED ORDER — SODIUM CHLORIDE 0.9% FLUSH
10.0000 mL | Freq: Once | INTRAVENOUS | Status: AC
  Administered 2022-12-04: 10 mL

## 2022-12-04 MED ORDER — SODIUM CHLORIDE 0.9 % IV SOLN
70.0000 mg/m2 | Freq: Once | INTRAVENOUS | Status: AC
  Administered 2022-12-04: 172 mg via INTRAVENOUS
  Filled 2022-12-04: qty 40

## 2022-12-04 MED ORDER — ONDANSETRON HCL 8 MG PO TABS
8.0000 mg | ORAL_TABLET | Freq: Three times a day (TID) | ORAL | 1 refills | Status: DC | PRN
Start: 2022-12-04 — End: 2023-09-25

## 2022-12-04 MED ORDER — SODIUM CHLORIDE 0.9 % IV SOLN
400.0000 mg/m2 | Freq: Once | INTRAVENOUS | Status: AC
  Administered 2022-12-04: 980 mg via INTRAVENOUS
  Filled 2022-12-04: qty 49

## 2022-12-04 MED ORDER — ATROPINE SULFATE 1 MG/ML IV SOLN
0.5000 mg | Freq: Once | INTRAVENOUS | Status: DC | PRN
  Filled 2022-12-04: qty 1

## 2022-12-04 MED ORDER — SODIUM CHLORIDE 0.9 % IV SOLN: Freq: Once | INTRAVENOUS | Status: AC

## 2022-12-04 NOTE — Progress Notes (Signed)
[facility]   [phone] [phone]   Clinic Follow up Note   Patient Care Team: [name], MD as PCP - General (Family Medicine) [name], MD as Consulting Physician (Oncology) Tito Dine., MD as Consulting Physician (General Surgery)  Date of Service:  12/04/2022  CHIEF COMPLAINT: f/u of  metastatic pancreatic cancer   CURRENT THERAPY: third line FOLFIRI with liposomal irinotecan q14 days, starting this week or next (once approved)    ASSESSMENT:  Steve Mills is a 57 y.o. male with   Pancreatic cancer metastasized to liver [facility]) metastatic to liver, stage IV, KRAS G12R (+), MMR normal  -incidental finding on lung cancer screening chest CT on 01/19/22. 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  -He began first-line palliative FOLFIRINOX on 03/13/22, but unfortunately did not respond well. CA 19-9 also rose on treatment. -he switched to second line gemcitabine/abraxane on 06/05/22. He tolerates moderate well and has continued to work full time. CA 19-9 has dropped on treatment. -restaging CT from 08/24/22 showed partial response to treatment, improved primary tumor, and liver/node mets, I discussed with him -will continue current chemo   -Pt was referred to see Dr. [name] on 10/10/2022 , surgery was discussed but not felt to be a surgical candidate due to his multiple liver metastasis (5) -Restaging CT scan on November 24, 2022 showed moderate disease progression in liver and the pancreas, his tumor marker also increased.  Plan to change his chemotherapy to 5-FU and liposomal irinotecan, start today.  Peripheral neuropathy due to chemotherapy (HCC) -Started in January 2024, secondary to oxaliplatin and Abraxane -Overall just numbness in fingers and toes, mild tingling, will continue to watch closely -He is taking B12, and use ice  packs during chemoinfusion.  -overall mild and stable     Goal of care discussion  -We again discussed the incurable nature of his cancer, and the overall poor prognosis, especially if he does not have good response to chemotherapy or progress on chemo -The patient understands the goal of care is palliative. -He is full code now    PLAN: -lab reviewed - discuss chemo side effects with pt. -proceed with C1  5-FU and liposomal irinotecan today. -discuss Living Will, he will think about it  -lab/flush and f/u 12/18/2022 -will start clinical trial search in [place] for him      SUMMARY OF ONCOLOGIC HISTORY: Oncology History Overview Note   Cancer Staging  Pancreatic cancer metastasized to liver [facility]) Staging form: Exocrine Pancreas, AJCC 8th Edition - Clinical stage from 02/28/2022: Stage IV (cT2, cN1, pM1) - Signed by [name], MD on 03/02/2022 Stage prefix: Initial diagnosis     Pancreatic cancer metastasized to liver  02/28/2022 Cancer Staging   Staging form: Exocrine Pancreas, AJCC 8th Edition - Clinical stage from 02/28/2022: Stage IV (cT2, cN1, pM1) - Signed by [name], MD on 03/02/2022 Stage prefix: Initial diagnosis   03/02/2022 Initial Diagnosis   Pancreatic cancer metastasized to liver (HCC)   03/13/2022 - 04/13/2022 Chemotherapy   Patient is on Treatment Plan : PANCREAS Modified FOLFIRINOX q14d x 4 cycles     03/13/2022 - 05/17/2022 Chemotherapy   Patient is on Treatment Plan : PANCREAS Modified FOLFIRINOX q14d x 4 cycles     03/18/2022 Genetic Testing   Negative genetic testing on the CancerNext-Expanded+RNAinsight panel.  APC [id number] VUs identified.  The report date  is March 18, 2022.  The CancerNext-Expanded gene panel offered by [facility] and includes sequencing and rearrangement analysis for the following 77 genes: AIP, ALK, APC*, ATM*, AXIN2, BAP1, BARD1, BLM, BMPR1A, BRCA1*, BRCA2*, BRIP1*, CDC73, CDH1*, CDK4, CDKN1B, CDKN2A, CHEK2*, CTNNA1, DICER1, FANCC, FH,  FLCN, GALNT12, KIF1B, LZTR1, MAX, MEN1, MET, MLH1*, MSH2*, MSH3, MSH6*, MUTYH*, NBN, NF1*, NF2, NTHL1, PALB2*, PHOX2B, PMS2*, POT1, PRKAR1A, PTCH1, PTEN*, RAD51C*, RAD51D*, RB1, RECQL, RET, SDHA, SDHAF2, SDHB, SDHC, SDHD, SMAD4, SMARCA4, SMARCB1, SMARCE1, STK11, SUFU, TMEM127, TP53*, TSC1, TSC2, VHL and XRCC2 (sequencing and deletion/duplication); EGFR, EGLN1, HOXB13, KIT, MITF, PDGFRA, POLD1, and POLE (sequencing only); EPCAM and GREM1 (deletion/duplication only). DNA and RNA analyses performed for * genes.    05/25/2022 Progression   Rising CA 19-9, CT CAP IMPRESSION: 1. Multiple new and enlarged hypodense, rim enhancing liver metastases. 2. Unchanged, ill-defined hypoenhancing mass of the pancreatic uncinate, consistent with pancreatic adenocarcinoma 3. Unchanged small prominent portacaval and gastrohepatic ligament nodes, modestly suspicious for nodal metastases. No overtly enlarged lymph nodes in the abdomen or pelvis. 4. Prostatomegaly.    06/05/2022 - 11/13/2022 Chemotherapy   Patient is on Treatment Plan : PANCREATIC Abraxane D1,8,15 + Gemcitabine D1,8,15 q28d     11/24/2022 Imaging   IMPRESSION: 1. Today's study demonstrates mild progression of disease as evidenced by slight enlargement of the mass in the uncinate process of the pancreas which demonstrates potential direct invasion into the superior wall of the third portion of the duodenum, but currently demonstrates no definite vascular involvement. Multiple hepatic lesions are generally stable to the prior study, with exception of enlargement of a lesion in segment 8, as detailed above. 2. No new sites of metastatic disease are noted elsewhere in the chest or pelvis. 3. Aortic atherosclerosis.  Rising CA 19-9    12/04/2022 -  Chemotherapy   Patient is on Treatment Plan : PANCREAS Liposomal Irinotecan + Leucovorin + 5-FU IVCI q14d        INTERVAL HISTORY:  Steve Mills is here for a follow up of  metastatic pancreatic cancer .  He was last seen by NP Lacie on 4/8/20224. He presents to the clinic accompanied by wife. Pt state that he doesn't not feel weak or fatigue.     All other systems were reviewed with the patient and are negative.  MEDICAL HISTORY:  Past Medical History:  Diagnosis Date   Family history of adverse reaction to anesthesia    mother allergic to ether    Family history of breast cancer    Family history of colon cancer    Pneumonia    hx of 2014     SURGICAL HISTORY: Past Surgical History:  Procedure Laterality Date   APPENDECTOMY     CHOLECYSTECTOMY N/A 07/12/2018   Procedure: LAPAROSCOPIC CHOLECYSTECTOMY WITH INTRAOPERATIVE CHOLANGIOGRAM;  Surgeon: Luretha Murphy, MD;  Location: [facility];  Service: General;  Laterality: N/A;   left wrist surgery      has plate in arm    NASAL POLYP SURGERY  2017   PORTACATH PLACEMENT N/A 03/10/2022   Procedure: INSERTION PORT-A-CATH;  Surgeon: Fritzi Mandes, MD;  Location: [facility];  Service: General;  Laterality: N/A;   VENTRAL HERNIA REPAIR N/A 07/15/2014   Procedure: LAPAROSCOPIC REPAIR INCARCARTED UMBILICAL  VENTRAL HERNIA, ;  Surgeon: Claud Kelp, MD;  Location: [facility];  Service: General;  Laterality: N/A;  With MESH    I have reviewed the social history and family history with the patient and they are unchanged from previous  note.  ALLERGIES:  has No Known Allergies.  MEDICATIONS:  Current Outpatient Medications  Medication Sig Dispense Refill   ALPRAZolam (XANAX) 0.25 MG tablet Take 1 tablet (0.25 mg total) by mouth 2 (two) times daily as needed for anxiety (and nausea). (Patient not taking: Reported on 10/03/2022) 20 tablet 0   dexamethasone (DECADRON) 4 MG tablet Take 2 tablets (8 mg total) by mouth daily. Start the day after irinotecan chemotherapy for 2 days. Take with food. 8 tablet 5   lactobacillus acidophilus & bulgar (LACTINEX) chewable tablet CHEW 1 TABLET BY MOUTH 3 (THREE) TIMES DAILY WITH MEALS. 40 tablet 0    lidocaine-prilocaine (EMLA) cream Apply 1 Application topically as needed. 30 g 2   lidocaine-prilocaine (EMLA) cream Apply to affected area once 30 g 3   ondansetron (ZOFRAN) 8 MG tablet Take 1 tablet (8 mg total) by mouth every 8 (eight) hours as needed for nausea or vomiting. Start on the third day after irinotecan 30 tablet 1   prochlorperazine (COMPAZINE) 10 MG tablet Take 1 tablet (10 mg total) by mouth every 6 (six) hours as needed for nausea or vomiting. 30 tablet 1   No current facility-administered medications for this visit.   Facility-Administered Medications Ordered in Other Visits  Medication Dose Route Frequency Provider Last Rate Last Admin   dexamethasone (DECADRON) 10 mg in sodium chloride 0.9 % 50 mL IVPB  10 mg Intravenous Once [name], MD 204 mL/hr at 12/04/22 1150 10 mg at 12/04/22 1150   fluorouracil (ADRUCIL) 5,900 mg in sodium chloride 0.9 % 132 mL chemo infusion  2,400 mg/m2 (Treatment Plan Recorded) Intravenous 1 day or 1 dose [name], MD       irinotecan LIPOSOME (ONIVYDE) 172 mg in sodium chloride 0.9 % 500 mL chemo infusion  70 mg/m2 (Treatment Plan Recorded) Intravenous Once [name], MD       leucovorin 980 mg in sodium chloride 0.9 % 250 mL infusion  400 mg/m2 (Treatment Plan Recorded) Intravenous Once [name], MD        PHYSICAL EXAMINATION: ECOG PERFORMANCE STATUS: 1 - Symptomatic but completely ambulatory  Vitals:   12/04/22 1049  BP: (!) 142/99  Pulse: 78  Resp: 14  Temp: 98.2 F (36.8 C)  SpO2: 95%   Wt Readings from Last 3 Encounters:  12/04/22 261 lb 6.4 oz (118.6 kg)  11/27/22 260 lb 12.8 oz (118.3 kg)  11/13/22 264 lb 8.8 oz (120 kg)     GENERAL:alert, no distress and comfortable SKIN: skin color normal, no rashes or significant lesions EYES: normal, Conjunctiva are pink and non-injected, sclera clear  NEURO: alert & oriented x 3 with fluent speech  LABORATORY DATA:  I have reviewed the data as listed    Latest Ref Rng & Units  12/04/2022   10:20 AM 11/27/2022   10:48 AM 11/13/2022   11:01 AM  CBC  WBC 4.0 - 10.5 K/uL 7.0  6.9  7.5   Hemoglobin 13.0 - 17.0 g/dL 16.1  09.6  04.5   Hematocrit 39.0 - 52.0 % 37.6  35.7  32.4   Platelets 150 - 400 K/uL 415  399  304         Latest Ref Rng & Units 12/04/2022   10:20 AM 11/27/2022   10:48 AM 11/13/2022   11:01 AM  CMP  Glucose 70 - 99 mg/dL 409  811  914   BUN 6 - 20 mg/dL Creatinine 0.61 -  1.24 mg/dL 1.66  0.60  0.45   Sodium 135 - 145 mmol/L 138  139  140   Potassium 3.5 - 5.1 mmol/L 4.0  4.4  4.2   Chloride 98 - 111 mmol/L 106  106  108   CO2 22 - 32 mmol/L 26  26  26    Calcium 8.9 - 10.3 mg/dL 9.6  9.6  9.0   Total Protein 6.5 - 8.1 g/dL 7.1  6.8  6.5   Total Bilirubin 0.3 - 1.2 mg/dL 0.6  0.5  0.3   Alkaline Phos 38 - 126 U/L 74  69  71   AST 15 - 41 U/L 22  20  29    ALT 0 - 44 U/L 19  22  35       RADIOGRAPHIC STUDIES: I have personally reviewed the radiological images as listed and agreed with the findings in the report. No results found.    Orders Placed This Encounter  Procedures   Cancer antigen 19-9    Standing Status:   Standing    Number of Occurrences:   20    Standing Expiration Date:   12/04/2023   All questions were answered. The patient knows to call the clinic with any problems, questions or concerns. No barriers to learning was detected. The total time spent in the appointment was 25 minutes.     [name], MD 12/04/2022   Carolin Coy, CMA, am acting as scribe for [name], MD.   I have reviewed the above documentation for accuracy and completeness, and I agree with the above.

## 2022-12-04 NOTE — Telephone Encounter (Signed)
Notified Patient of prior authorization approval for Lidocaine-Prilocaine 2.5% Cream. Medication is approved through 03/04/2023. No other needs or concerns voiced at this time.

## 2022-12-04 NOTE — Patient Instructions (Addendum)
[facility]  Discharge Instructions: Thank you for choosing [facility] to provide your oncology and hematology care.   If you have a lab appointment with the Cancer Center, please go directly to the Cancer Center and check in at the registration area.   Wear comfortable clothing and clothing appropriate for easy access to any Portacath or PICC line.   We strive to give you quality time with your provider. You may need to reschedule your appointment if you arrive late (15 or more minutes).  Arriving late affects you and other patients whose appointments are after yours.  Also, if you miss three or more appointments without notifying the office, you may be dismissed from the clinic at the provider's discretion.      For prescription refill requests, have your pharmacy contact our office and allow 72 hours for refills to be completed.    Today you received the following chemotherapy and/or immunotherapy agents: Onivyde and Fluorouracil     To help prevent nausea and vomiting after your treatment, we encourage you to take your nausea medication as directed.  BELOW ARE SYMPTOMS THAT SHOULD BE REPORTED IMMEDIATELY: *FEVER GREATER THAN 100.4 F (38 C) OR HIGHER *CHILLS OR SWEATING *NAUSEA AND VOMITING THAT IS NOT CONTROLLED WITH YOUR NAUSEA MEDICATION *UNUSUAL SHORTNESS OF BREATH *UNUSUAL BRUISING OR BLEEDING *URINARY PROBLEMS (pain or burning when urinating, or frequent urination) *BOWEL PROBLEMS (unusual diarrhea, constipation, pain near the anus) TENDERNESS IN MOUTH AND THROAT WITH OR WITHOUT PRESENCE OF ULCERS (sore throat, sores in mouth, or a toothache) UNUSUAL RASH, SWELLING OR PAIN  UNUSUAL VAGINAL DISCHARGE OR ITCHING   Items with * indicate a potential emergency and should be followed up as soon as possible or go to the Emergency Department if any problems should occur.  Please show the CHEMOTHERAPY ALERT CARD or IMMUNOTHERAPY ALERT  CARD at check-in to the Emergency Department and triage nurse.  Should you have questions after your visit or need to cancel or reschedule your appointment, please contact [facility]  Dept: [phone]  and follow the prompts.  Office hours are 8:00 a.m. to 4:30 p.m. Monday - Friday. Please note that voicemails left after 4:00 p.m. may not be returned until the following business day.  We are closed weekends and major holidays. You have access to a nurse at all times for urgent questions. Please call the main number to the clinic Dept: [phone] and follow the prompts.   For any non-urgent questions, you may also contact your provider using MyChart. We now offer e-Visits for anyone 40 and older to request care online for non-urgent symptoms. For details visit [email or web address].   Also download the MyChart app! Go to the app store, search "MyChart", open the app, select [facility], and log in with your MyChart username and password.  Irinotecan Liposome Injection What is this medication? IRINOTECAN LIPOSOME (eye ri noe TEE kan LIP oh som) treats pancreatic cancer. It works by slowing down the growth of cancer cells. This medicine may be used for other purposes; ask your health care provider or pharmacist if you have questions. COMMON BRAND NAME(S): ONIVYDE What should I tell my care team before I take this medication? They need to know if you have any of these conditions: Blockage in your bowels Dehydration Infection Low white blood cell levels Lung disease An unusual or allergic reaction to irinotecan liposome, irinotecan, other medications, foods, dyes, or preservatives Pregnant  or trying to get pregnant Breast-feeding How should I use this medication? This medication is injected into a vein. It is given by your care team in a hospital or clinic setting. Talk to your care team about the use of this medication in children. Special care may be  needed. Overdosage: If you think you have taken too much of this medicine contact a poison control center or emergency room at once. NOTE: This medicine is only for you. Do not share this medicine with others. What if I miss a dose? Keep appointments for follow-up doses. It is important not to miss your dose. Call your care team if you are unable to keep an appointment. What may interact with this medication? Do not take this medication with any of the following: Itraconazole This medication may also interact with the following: Certain antivirals for HIV or AIDS Certain medications for seizures, such as carbamazepine, fosphenytoin, phenytoin, phenobarbital Clarithromycin Gemfibrozil Nefazodone Rifabutin Rifampin Rifapentine St. John's Wort Voriconazole This list may not describe all possible interactions. Give your health care provider a list of all the medicines, herbs, non-prescription drugs, or dietary supplements you use. Also tell them if you smoke, drink alcohol, or use illegal drugs. Some items may interact with your medicine. What should I watch for while using this medication? This medication may make you feel generally unwell. This is not uncommon as chemotherapy can affect healthy cells as well as cancer cells. Report any side effects. Continue your course of treatment even though you feel ill unless your care team tells you to stop. You may need blood work while you are taking this medication. This medication can cause serious side effects and allergic reactions. To reduce your risk, your care team may give you other medications to take before receiving this one. Be sure to follow the directions from your care team. Check with your care team if you get an attack of diarrhea, nausea and vomiting, or if you sweat a lot. The loss of too much body fluid can make it dangerous for you to take this medication. This medication may cause infertility. Talk to your care team if you are  concerned about your fertility. Talk to your care team if you wish to become pregnant or if you think you might be pregnant. This medication can cause serious birth defects if taken during pregnancy or if you get pregnant within 7 months after stopping therapy. A negative pregnancy test is required before starting this medication. A reliable form of contraception is recommended while taking this medication and for 7 months after stopping it. Talk to your care team about reliable forms of contraception. Use a condom during sex and for 4 months after stopping therapy. Tell your care team right away if you think your partner might be pregnant. This medication can cause serious birth defects. Do not breast-feed while taking this medication and for 1 month after stopping therapy. This medication may increase your risk of getting an infection. Call your care team for advice if you get a fever, chills, sore throat, or other symptoms of a cold or flu. Do not treat yourself. Try to avoid being around people who are sick. Avoid taking medications that contain aspirin, acetaminophen, ibuprofen, naproxen, or ketoprofen unless instructed by your care team. These medications may hide a fever. Be careful brushing or flossing your teeth or using a toothpick because you may get an infection or bleed more easily. If you have any dental work done, tell your dentist you  are receiving this medication. What side effects may I notice from receiving this medication? Side effects that you should report to your care team as soon as possible: Allergic reactions or angioedema--skin rash, itching or hives, swelling of the face, eyes, lips, tongue, arms, or legs, trouble swallowing or breathing Dry cough, shortness of breath or trouble breathing Diarrhea Infection--fever, chills, cough, or sore throat Side effects that usually do not require medical attention (report to your care team if they continue or are  bothersome): Fatigue Loss of appetite Nausea Pain, redness, or swelling with sores inside the mouth or throat Vomiting This list may not describe all possible side effects. Call your doctor for medical advice about side effects. You may report side effects to FDA at 1-800-FDA-1088. Where should I keep my medication? This medication is given in a hospital or clinic. It will not be stored at home. NOTE: This sheet is a summary. It may not cover all possible information. If you have questions about this medicine, talk to your doctor, pharmacist, or health care provider.  2023 Elsevier/Gold Standard (2021-10-03 00:00:00) Fluorouracil Injection What is this medication? FLUOROURACIL (flure oh YOOR a sil) treats some types of cancer. It works by slowing down the growth of cancer cells. This medicine may be used for other purposes; ask your health care provider or pharmacist if you have questions. COMMON BRAND NAME(S): Adrucil What should I tell my care team before I take this medication? They need to know if you have any of these conditions: Blood disorders Dihydropyrimidine dehydrogenase (DPD) deficiency Infection, such as chickenpox, cold sores, herpes Kidney disease Liver disease Poor nutrition Recent or ongoing radiation therapy An unusual or allergic reaction to fluorouracil, other medications, foods, dyes, or preservatives If you or your partner are pregnant or trying to get pregnant Breast-feeding How should I use this medication? This medication is injected into a vein. It is administered by your care team in a hospital or clinic setting. Talk to your care team about the use of this medication in children. Special care may be needed. Overdosage: If you think you have taken too much of this medicine contact a poison control center or emergency room at once. NOTE: This medicine is only for you. Do not share this medicine with others. What if I miss a dose? Keep appointments for  follow-up doses. It is important not to miss your dose. Call your care team if you are unable to keep an appointment. What may interact with this medication? Do not take this medication with any of the following: Live virus vaccines This medication may also interact with the following: Medications that treat or prevent blood clots, such as warfarin, enoxaparin, dalteparin This list may not describe all possible interactions. Give your health care provider a list of all the medicines, herbs, non-prescription drugs, or dietary supplements you use. Also tell them if you smoke, drink alcohol, or use illegal drugs. Some items may interact with your medicine. What should I watch for while using this medication? Your condition will be monitored carefully while you are receiving this medication. This medication may make you feel generally unwell. This is not uncommon as chemotherapy can affect healthy cells as well as cancer cells. Report any side effects. Continue your course of treatment even though you feel ill unless your care team tells you to stop. In some cases, you may be given additional medications to help with side effects. Follow all directions for their use. This medication may increase your risk of  getting an infection. Call your care team for advice if you get a fever, chills, sore throat, or other symptoms of a cold or flu. Do not treat yourself. Try to avoid being around people who are sick. This medication may increase your risk to bruise or bleed. Call your care team if you notice any unusual bleeding. Be careful brushing or flossing your teeth or using a toothpick because you may get an infection or bleed more easily. If you have any dental work done, tell your dentist you are receiving this medication. Avoid taking medications that contain aspirin, acetaminophen, ibuprofen, naproxen, or ketoprofen unless instructed by your care team. These medications may hide a fever. Do not treat  diarrhea with over the counter products. Contact your care team if you have diarrhea that lasts more than 2 days or if it is severe and watery. This medication can make you more sensitive to the sun. Keep out of the sun. If you cannot avoid being in the sun, wear protective clothing and sunscreen. Do not use sun lamps, tanning beds, or tanning booths. Talk to your care team if you or your partner wish to become pregnant or think you might be pregnant. This medication can cause serious birth defects if taken during pregnancy and for 3 months after the last dose. A reliable form of contraception is recommended while taking this medication and for 3 months after the last dose. Talk to your care team about effective forms of contraception. Do not father a child while taking this medication and for 3 months after the last dose. Use a condom while having sex during this time period. Do not breastfeed while taking this medication. This medication may cause infertility. Talk to your care team if you are concerned about your fertility. What side effects may I notice from receiving this medication? Side effects that you should report to your care team as soon as possible: Allergic reactions--skin rash, itching, hives, swelling of the face, lips, tongue, or throat Heart attack--pain or tightness in the chest, shoulders, arms, or jaw, nausea, shortness of breath, cold or clammy skin, feeling faint or lightheaded Heart failure--shortness of breath, swelling of the ankles, feet, or hands, sudden weight gain, unusual weakness or fatigue Heart rhythm changes--fast or irregular heartbeat, dizziness, feeling faint or lightheaded, chest pain, trouble breathing High ammonia level--unusual weakness or fatigue, confusion, loss of appetite, nausea, vomiting, seizures Infection--fever, chills, cough, sore throat, wounds that don't heal, pain or trouble when passing urine, general feeling of discomfort or being unwell Low red  blood cell level--unusual weakness or fatigue, dizziness, headache, trouble breathing Pain, tingling, or numbness in the hands or feet, muscle weakness, change in vision, confusion or trouble speaking, loss of balance or coordination, trouble walking, seizures Redness, swelling, and blistering of the skin over hands and feet Severe or prolonged diarrhea Unusual bruising or bleeding Side effects that usually do not require medical attention (report to your care team if they continue or are bothersome): Dry skin Headache Increased tears Nausea Pain, redness, or swelling with sores inside the mouth or throat Sensitivity to light Vomiting This list may not describe all possible side effects. Call your doctor for medical advice about side effects. You may report side effects to FDA at 1-800-FDA-1088. Where should I keep my medication? This medication is given in a hospital or clinic. It will not be stored at home. NOTE: This sheet is a summary. It may not cover all possible information. If you have questions about this  medicine, talk to your doctor, pharmacist, or health care provider.  2023 Elsevier/Gold Standard (2021-12-06 00:00:00) Leucovorin Injection What is this medication? LEUCOVORIN (loo koe VOR in) prevents side effects from certain medications, such as methotrexate. It works by increasing folate levels. This helps protect healthy cells in your body. It may also be used to treat anemia caused by low levels of folate. It can also be used with fluorouracil, a type of chemotherapy, to treat colorectal cancer. It works by increasing the effects of fluorouracil in the body. This medicine may be used for other purposes; ask your health care provider or pharmacist if you have questions. What should I tell my care team before I take this medication? They need to know if you have any of these conditions: Anemia from low levels of vitamin B12 in the blood An unusual or allergic reaction to  leucovorin, folic acid, other medications, foods, dyes, or preservatives Pregnant or trying to get pregnant Breastfeeding How should I use this medication? This medication is injected into a vein or a muscle. It is given by your care team in a hospital or clinic setting. Talk to your care team about the use of this medication in children. Special care may be needed. Overdosage: If you think you have taken too much of this medicine contact a poison control center or emergency room at once. NOTE: This medicine is only for you. Do not share this medicine with others. What if I miss a dose? Keep appointments for follow-up doses. It is important not to miss your dose. Call your care team if you are unable to keep an appointment. What may interact with this medication? Capecitabine Fluorouracil Phenobarbital Phenytoin Primidone Trimethoprim;sulfamethoxazole This list may not describe all possible interactions. Give your health care provider a list of all the medicines, herbs, non-prescription drugs, or dietary supplements you use. Also tell them if you smoke, drink alcohol, or use illegal drugs. Some items may interact with your medicine. What should I watch for while using this medication? Your condition will be monitored carefully while you are receiving this medication. This medication may increase the side effects of 5-fluorouracil. Tell your care team if you have diarrhea or mouth sores that do not get better or that get worse. What side effects may I notice from receiving this medication? Side effects that you should report to your care team as soon as possible: Allergic reactions--skin rash, itching, hives, swelling of the face, lips, tongue, or throat This list may not describe all possible side effects. Call your doctor for medical advice about side effects. You may report side effects to FDA at 1-800-FDA-1088. Where should I keep my medication? This medication is given in a hospital or  clinic. It will not be stored at home. NOTE: This sheet is a summary. It may not cover all possible information. If you have questions about this medicine, talk to your doctor, pharmacist, or health care provider.  2023 Elsevier/Gold Standard (2021-12-16 00:00:00)

## 2022-12-04 NOTE — Assessment & Plan Note (Signed)
-  Started in January 2024, secondary to oxaliplatin and Abraxane -Overall just numbness in fingers and toes, mild tingling, will continue to watch closely -He is taking B12, and use ice packs during chemoinfusion.  -overall mild and stable

## 2022-12-04 NOTE — Assessment & Plan Note (Signed)
metastatic to liver, stage IV, KRAS G12R (+), MMR normal  -incidental finding on lung cancer screening chest CT on 01/19/22. 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  -He began first-line palliative FOLFIRINOX on 03/13/22, but unfortunately did not respond well. CA 19-9 also rose on treatment. -he switched to second line gemcitabine/abraxane on 06/05/22. He tolerates moderate well and has continued to work full time. CA 19-9 has dropped on treatment. -restaging CT from 08/24/22 showed partial response to treatment, improved primary tumor, and liver/node mets, I discussed with him -will continue current chemo   -Pt was referred to see Dr. [name] on 10/10/2022 , surgery was discussed but not felt to be a surgical candidate due to his multiple liver metastasis (5) -Restaging CT scan on November 24, 2022 showed moderate disease progression in liver and the pancreas, his tumor marker also increased.  Plan to change his chemotherapy to 5-FU and liposomal irinotecan, start today.

## 2022-12-05 ENCOUNTER — Telehealth: Payer: Self-pay | Admitting: *Deleted

## 2022-12-05 LAB — CANCER ANTIGEN 19-9: CA 19-9: 47 U/mL — ABNORMAL HIGH (ref 0–35)

## 2022-12-05 NOTE — Telephone Encounter (Signed)
-----   Message from Julieanne Manson, RN sent at 12/04/2022  3:25 PM EDT ----- Regarding: First time/ Irinotecan liposome/ Dr Mosetta Putt pt Pt had first time Irinotecan liposome today, tolerated well. Thanks!

## 2022-12-05 NOTE — Telephone Encounter (Signed)
Called pt to see how he was doing since treatment yesterday.  He reports doing well & denies any concerns.  He knows his next appt & how to reach [place] if needed.

## 2022-12-06 ENCOUNTER — Inpatient Hospital Stay: Payer: BC Managed Care – PPO

## 2022-12-06 ENCOUNTER — Other Ambulatory Visit: Payer: Self-pay

## 2022-12-06 VITALS — BP 128/89 | HR 63 | Temp 98.0°F | Resp 16

## 2022-12-06 DIAGNOSIS — C787 Secondary malignant neoplasm of liver and intrahepatic bile duct: Secondary | ICD-10-CM | POA: Diagnosis not present

## 2022-12-06 DIAGNOSIS — I700 Atherosclerosis of aorta: Secondary | ICD-10-CM | POA: Diagnosis not present

## 2022-12-06 DIAGNOSIS — Z800 Family history of malignant neoplasm of digestive organs: Secondary | ICD-10-CM | POA: Diagnosis not present

## 2022-12-06 DIAGNOSIS — C250 Malignant neoplasm of head of pancreas: Secondary | ICD-10-CM | POA: Diagnosis not present

## 2022-12-06 DIAGNOSIS — Z5111 Encounter for antineoplastic chemotherapy: Secondary | ICD-10-CM | POA: Diagnosis not present

## 2022-12-06 DIAGNOSIS — Z803 Family history of malignant neoplasm of breast: Secondary | ICD-10-CM | POA: Diagnosis not present

## 2022-12-06 DIAGNOSIS — G620 Drug-induced polyneuropathy: Secondary | ICD-10-CM | POA: Diagnosis not present

## 2022-12-06 DIAGNOSIS — Z452 Encounter for adjustment and management of vascular access device: Secondary | ICD-10-CM | POA: Diagnosis not present

## 2022-12-06 MED ORDER — HEPARIN SOD (PORK) LOCK FLUSH 100 UNIT/ML IV SOLN
500.0000 [IU] | Freq: Once | INTRAVENOUS | Status: AC | PRN
  Administered 2022-12-06: 500 [IU]

## 2022-12-06 MED ORDER — SODIUM CHLORIDE 0.9% FLUSH
10.0000 mL | INTRAVENOUS | Status: DC | PRN
  Administered 2022-12-06: 10 mL

## 2022-12-15 MED FILL — Dexamethasone Sodium Phosphate Inj 100 MG/10ML: INTRAMUSCULAR | Qty: 1 | Status: AC

## 2022-12-17 NOTE — Progress Notes (Unsigned)
Patient Care Team: [name], MD as PCP - General (Family Medicine) [name], MD as Consulting Physician (Oncology) [name], Revonda Standard., MD as Consulting Physician (General Surgery)   CHIEF COMPLAINT: Follow-up pancreatic cancer  Oncology History Overview Note   Cancer Staging  Pancreatic cancer metastasized to liver [facility]) Staging form: Exocrine Pancreas, AJCC 8th Edition - Clinical stage from 02/28/2022: Stage IV (cT2, cN1, pM1) - Signed by [name], MD on 03/02/2022 Stage prefix: Initial diagnosis     Pancreatic cancer metastasized to liver (HCC)  02/28/2022 Cancer Staging   Staging form: Exocrine Pancreas, AJCC 8th Edition - Clinical stage from 02/28/2022: Stage IV (cT2, cN1, pM1) - Signed by [name], MD on 03/02/2022 Stage prefix: Initial diagnosis   03/02/2022 Initial Diagnosis   Pancreatic cancer metastasized to liver (HCC)   03/13/2022 - 04/13/2022 Chemotherapy   Patient is on Treatment Plan : PANCREAS Modified FOLFIRINOX q14d x 4 cycles     03/13/2022 - 05/17/2022 Chemotherapy   Patient is on Treatment Plan : PANCREAS Modified FOLFIRINOX q14d x 4 cycles     03/18/2022 Genetic Testing   Negative genetic testing on the CancerNext-Expanded+RNAinsight panel.  APC [id number] VUs identified.  The report date is March 18, 2022.  The CancerNext-Expanded gene panel offered by [facility] and includes sequencing and rearrangement analysis for the following 77 genes: AIP, ALK, APC*, ATM*, AXIN2, BAP1, BARD1, BLM, BMPR1A, BRCA1*, BRCA2*, BRIP1*, CDC73, CDH1*, CDK4, CDKN1B, CDKN2A, CHEK2*, CTNNA1, DICER1, FANCC, FH, FLCN, GALNT12, KIF1B, LZTR1, MAX, MEN1, MET, MLH1*, MSH2*, MSH3, MSH6*, MUTYH*, NBN, NF1*, NF2, NTHL1, PALB2*, PHOX2B, PMS2*, POT1, PRKAR1A, PTCH1, PTEN*, RAD51C*, RAD51D*, RB1, RECQL, RET, SDHA, SDHAF2, SDHB, SDHC, SDHD, SMAD4, SMARCA4, SMARCB1, SMARCE1, STK11, SUFU, TMEM127, TP53*, TSC1, TSC2, VHL and XRCC2 (sequencing and deletion/duplication); EGFR, EGLN1, HOXB13,  KIT, MITF, PDGFRA, POLD1, and POLE (sequencing only); EPCAM and GREM1 (deletion/duplication only). DNA and RNA analyses performed for * genes.    05/25/2022 Progression   Rising CA 19-9, CT CAP IMPRESSION: 1. Multiple new and enlarged hypodense, rim enhancing liver metastases. 2. Unchanged, ill-defined hypoenhancing mass of the pancreatic uncinate, consistent with pancreatic adenocarcinoma 3. Unchanged small prominent portacaval and gastrohepatic ligament nodes, modestly suspicious for nodal metastases. No overtly enlarged lymph nodes in the abdomen or pelvis. 4. Prostatomegaly.    06/05/2022 - 11/13/2022 Chemotherapy   Patient is on Treatment Plan : PANCREATIC Abraxane D1,8,15 + Gemcitabine D1,8,15 q28d     11/24/2022 Imaging   IMPRESSION: 1. Today's study demonstrates mild progression of disease as evidenced by slight enlargement of the mass in the uncinate process of the pancreas which demonstrates potential direct invasion into the superior wall of the third portion of the duodenum, but currently demonstrates no definite vascular involvement. Multiple hepatic lesions are generally stable to the prior study, with exception of enlargement of a lesion in segment 8, as detailed above. 2. No new sites of metastatic disease are noted elsewhere in the chest or pelvis. 3. Aortic atherosclerosis.  Rising CA 19-9    12/04/2022 -  Chemotherapy   Patient is on Treatment Plan : PANCREAS Liposomal Irinotecan + Leucovorin + 5-FU IVCI q14d        CURRENT THERAPY: third line FOLFIRI with liposomal irinotecan q14 days, starting 12/04/22  INTERVAL HISTORY Steve Mills returns for follow-up and treatment, last seen by Dr. Mosetta Putt 4/15 to begin cycle 1 FOLFIRI with liposomal irinotecan.  He felt well with pump infusing. On day 4 he had a "jittery" feeling in his stomach and  vomited, and then just felt "off" on day 5. He did not take decadron or zofran/compazine. He recovered after that, ate and drank well, worked  a lot. He is eating less sugar. Notes his usual weight is around 250-255. He was heavier on previous regimen. He gets winded if he walks up from basement to 2nd floor. Denies cough, chest pain, fever, diarrhea, pain, or other new/specific complaints.  ROS  All other systems reviewed and negative   Past Medical History:  Diagnosis Date   Family history of adverse reaction to anesthesia    mother allergic to ether    Family history of breast cancer    Family history of colon cancer    Pneumonia    hx of 2014      Past Surgical History:  Procedure Laterality Date   APPENDECTOMY     CHOLECYSTECTOMY N/A 07/12/2018   Procedure: LAPAROSCOPIC CHOLECYSTECTOMY WITH INTRAOPERATIVE CHOLANGIOGRAM;  Surgeon: Luretha Murphy, MD;  Location: [facility];  Service: General;  Laterality: N/A;   left wrist surgery      has plate in arm    NASAL POLYP SURGERY  2017   PORTACATH PLACEMENT N/A 03/10/2022   Procedure: INSERTION PORT-A-CATH;  Surgeon: Fritzi Mandes, MD;  Location: [facility] OR;  Service: General;  Laterality: N/A;   VENTRAL HERNIA REPAIR N/A 07/15/2014   Procedure: LAPAROSCOPIC REPAIR INCARCARTED UMBILICAL  VENTRAL HERNIA, ;  Surgeon: Claud Kelp, MD;  Location: [facility];  Service: General;  Laterality: N/A;  With MESH     Outpatient Encounter Medications as of 12/18/2022  Medication Sig   lidocaine-prilocaine (EMLA) cream Apply to affected area once   ondansetron (ZOFRAN) 8 MG tablet Take 1 tablet (8 mg total) by mouth every 8 (eight) hours as needed for nausea or vomiting. Start on the third day after irinotecan   prochlorperazine (COMPAZINE) 10 MG tablet Take 1 tablet (10 mg total) by mouth every 6 (six) hours as needed for nausea or vomiting.   [DISCONTINUED] lidocaine-prilocaine (EMLA) cream Apply 1 Application topically as needed.   dexamethasone (DECADRON) 4 MG tablet Take 2 tablets (8 mg total) by mouth daily. Start the day after irinotecan chemotherapy for 2 days. Take with food. (Patient  not taking: Reported on 12/18/2022)   lactobacillus acidophilus & bulgar (LACTINEX) chewable tablet CHEW 1 TABLET BY MOUTH 3 (THREE) TIMES DAILY WITH MEALS. (Patient not taking: Reported on 12/18/2022)   [DISCONTINUED] ALPRAZolam (XANAX) 0.25 MG tablet Take 1 tablet (0.25 mg total) by mouth 2 (two) times daily as needed for anxiety (and nausea). (Patient not taking: Reported on 10/03/2022)   No facility-administered encounter medications on file as of 12/18/2022.     Today's Vitals   12/18/22 1116  BP: 133/79  Pulse: 63  Resp: 18  Temp: 98.6 F (37 C)  TempSrc: Oral  SpO2: 95%  Weight: 254 lb 14.4 oz (115.6 kg)   Body mass index is 34.57 kg/m.   PHYSICAL EXAM GENERAL:alert, no distress and comfortable SKIN: no rash  EYES: sclera clear LUNGS: normal breathing effort HEART: no lower extremity edema NEURO: alert & oriented x 3 with fluent speech, no focal motor/sensory deficits   CBC    Component Value Date/Time   WBC 7.1 12/18/2022 1031   WBC 10.4 08/14/2022 0019   RBC 4.13 (L) 12/18/2022 1031   HGB 12.7 (L) 12/18/2022 1031   HCT 37.5 (L) 12/18/2022 1031   PLT 209 12/18/2022 1031   MCV 90.8 12/18/2022 1031   MCH 30.8 12/18/2022 1031  MCHC 33.9 12/18/2022 1031   RDW 15.4 12/18/2022 1031   LYMPHSABS 2.1 12/18/2022 1031   MONOABS 0.5 12/18/2022 1031   EOSABS 0.7 (H) 12/18/2022 1031   BASOSABS 0.1 12/18/2022 1031     CMP     Component Value Date/Time   NA 142 12/18/2022 1031   K 3.8 12/18/2022 1031   CL 108 12/18/2022 1031   CO2 26 12/18/2022 1031   GLUCOSE 120 (H) 12/18/2022 1031   BUN 14 12/18/2022 1031   CREATININE 0.94 12/18/2022 1031   CALCIUM 9.3 12/18/2022 1031   PROT 6.5 12/18/2022 1031   ALBUMIN 3.9 12/18/2022 1031   AST 19 12/18/2022 1031   ALT 21 12/18/2022 1031   ALKPHOS 70 12/18/2022 1031   BILITOT 0.6 12/18/2022 1031   GFRNONAA >60 12/18/2022 1031   GFRAA >60 07/12/2018 0448     ASSESSMENT & PLAN:Steve Mills is a 57 y.o. male with    1.  Pancreatic Head Adenocarcinoma, metastatic to liver, stage IV, MMR normal  -incidental finding on lung cancer screening chest CT on 01/19/22. 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  -liver biopsy on 02/28/22 confirmed poorly differentiated adenocarcinoma, compatible with clinical suspicion of pancreatic adenocarcinoma. MMR normal, not a candidate for immunotherapy.  -FO showed KRAS mutation, and MSS, no immunotherapy or targeted therapy available. BRCA1/2 was negative, not a candidate for PARP inhibitor  -He progressed after 5 cycles first-line palliative FOLFIRINOX 03/13/22 - 05/2022, and after 8 cycles second line gem/abraxane 05/2022 - 11/27/2022  -He met with Dr. [name] [facility], not a surgical candidate due to metastatic disease in the liver (at least 5 lesions) -Began third line FOLFIRI with liposomal irinotecan q14 days starting 12/04/22 -Steve Mills appears stable.  S/p cycle 1 FOLFIRI with liposomal irinotecan, he tolerated moderately well with nausea and vomiting (x1) on days 4 and 5 with weight loss.  He knows to begin Decadron on day 2, and will use Zofran/Compazine as needed.   -He was able to recover and function well.  There is no clinical evidence of disease progression -Labs reviewed, adequate to proceed with cycle 2 today as planned, same dose -Follow-up and cycle 3 in 2 weeks   2. Genetics -he has a strong family history of cancer, specifically liver and lung on the paternal side and breast and colon on the maternal side. -testing performed 03/08/22, results are negative.      PLAN: -Labs reviewed -Proceed with cycle 2 liposomal irinotecan/leuc and 5FU today, same dose -F/up and C3 in 2 weeks     All questions were answered. The patient knows to call the clinic with any problems, questions or concerns. No barriers to learning were detected.    Santiago Glad,  NP-C 12/18/2022

## 2022-12-18 ENCOUNTER — Inpatient Hospital Stay: Payer: BC Managed Care – PPO

## 2022-12-18 ENCOUNTER — Other Ambulatory Visit: Payer: Self-pay

## 2022-12-18 ENCOUNTER — Encounter: Payer: Self-pay | Admitting: Nurse Practitioner

## 2022-12-18 ENCOUNTER — Inpatient Hospital Stay (HOSPITAL_BASED_OUTPATIENT_CLINIC_OR_DEPARTMENT_OTHER): Payer: BC Managed Care – PPO | Admitting: Nurse Practitioner

## 2022-12-18 DIAGNOSIS — C787 Secondary malignant neoplasm of liver and intrahepatic bile duct: Secondary | ICD-10-CM

## 2022-12-18 DIAGNOSIS — Z5111 Encounter for antineoplastic chemotherapy: Secondary | ICD-10-CM | POA: Diagnosis not present

## 2022-12-18 DIAGNOSIS — Z95828 Presence of other vascular implants and grafts: Secondary | ICD-10-CM

## 2022-12-18 DIAGNOSIS — C259 Malignant neoplasm of pancreas, unspecified: Secondary | ICD-10-CM

## 2022-12-18 DIAGNOSIS — C250 Malignant neoplasm of head of pancreas: Secondary | ICD-10-CM | POA: Diagnosis not present

## 2022-12-18 DIAGNOSIS — I700 Atherosclerosis of aorta: Secondary | ICD-10-CM | POA: Diagnosis not present

## 2022-12-18 DIAGNOSIS — Z452 Encounter for adjustment and management of vascular access device: Secondary | ICD-10-CM | POA: Diagnosis not present

## 2022-12-18 DIAGNOSIS — Z800 Family history of malignant neoplasm of digestive organs: Secondary | ICD-10-CM | POA: Diagnosis not present

## 2022-12-18 DIAGNOSIS — G620 Drug-induced polyneuropathy: Secondary | ICD-10-CM | POA: Diagnosis not present

## 2022-12-18 DIAGNOSIS — Z803 Family history of malignant neoplasm of breast: Secondary | ICD-10-CM | POA: Diagnosis not present

## 2022-12-18 LAB — CMP (CANCER CENTER ONLY)
ALT: 21 U/L (ref 0–44)
AST: 19 U/L (ref 15–41)
Albumin: 3.9 g/dL (ref 3.5–5.0)
Alkaline Phosphatase: 70 U/L (ref 38–126)
Anion gap: 8 (ref 5–15)
BUN: 14 mg/dL (ref 6–20)
CO2: 26 mmol/L (ref 22–32)
Calcium: 9.3 mg/dL (ref 8.9–10.3)
Chloride: 108 mmol/L (ref 98–111)
Creatinine: 0.94 mg/dL (ref 0.61–1.24)
GFR, Estimated: >60 mL/min (ref >60)
Glucose, Bld: 120 mg/dL — ABNORMAL HIGH (ref 70–99)
Potassium: 3.8 mmol/L (ref 3.5–5.1)
Sodium: 142 mmol/L (ref 135–145)
Total Bilirubin: 0.6 mg/dL (ref 0.3–1.2)
Total Protein: 6.5 g/dL (ref 6.5–8.1)

## 2022-12-18 LAB — CBC WITH DIFFERENTIAL (CANCER CENTER ONLY)
Abs Immature Granulocytes: 0.02 10*3/uL (ref 0.00–0.07)
Basophils Absolute: 0.1 10*3/uL (ref 0.0–0.1)
Basophils Relative: 1 %
Eosinophils Absolute: 0.7 10*3/uL — ABNORMAL HIGH (ref 0.0–0.5)
Eosinophils Relative: 10 %
HCT: 37.5 % — ABNORMAL LOW (ref 39.0–52.0)
Hemoglobin: 12.7 g/dL — ABNORMAL LOW (ref 13.0–17.0)
Immature Granulocytes: 0 %
Lymphocytes Relative: 29 %
Lymphs Abs: 2.1 10*3/uL (ref 0.7–4.0)
MCH: 30.8 pg (ref 26.0–34.0)
MCHC: 33.9 g/dL (ref 30.0–36.0)
MCV: 90.8 fL (ref 80.0–100.0)
Monocytes Absolute: 0.5 10*3/uL (ref 0.1–1.0)
Monocytes Relative: 6 %
Neutro Abs: 3.8 10*3/uL (ref 1.7–7.7)
Neutrophils Relative %: 54 %
Platelet Count: 209 10*3/uL (ref 150–400)
RBC: 4.13 MIL/uL — ABNORMAL LOW (ref 4.22–5.81)
RDW: 15.4 % (ref 11.5–15.5)
WBC Count: 7.1 10*3/uL (ref 4.0–10.5)
nRBC: 0 % (ref 0.0–0.2)

## 2022-12-18 MED ORDER — SODIUM CHLORIDE 0.9 % IV SOLN
2400.0000 mg/m2 | INTRAVENOUS | Status: DC
  Administered 2022-12-18: 5900 mg via INTRAVENOUS
  Filled 2022-12-18: qty 118

## 2022-12-18 MED ORDER — SODIUM CHLORIDE 0.9% FLUSH
10.0000 mL | Freq: Once | INTRAVENOUS | Status: AC
  Administered 2022-12-18: 10 mL

## 2022-12-18 MED ORDER — SODIUM CHLORIDE 0.9% FLUSH
10.0000 mL | INTRAVENOUS | Status: DC | PRN
  Administered 2022-12-18: 10 mL

## 2022-12-18 MED ORDER — SODIUM CHLORIDE 0.9 % IV SOLN
70.0000 mg/m2 | Freq: Once | INTRAVENOUS | Status: AC
  Administered 2022-12-18: 172 mg via INTRAVENOUS
  Filled 2022-12-18: qty 40

## 2022-12-18 MED ORDER — HEPARIN SOD (PORK) LOCK FLUSH 100 UNIT/ML IV SOLN: 500.0000 [IU] | Freq: Once | INTRAVENOUS | Status: DC | PRN

## 2022-12-18 MED ORDER — SODIUM CHLORIDE 0.9 % IV SOLN: Freq: Once | INTRAVENOUS | Status: AC

## 2022-12-18 MED ORDER — SODIUM CHLORIDE 0.9 % IV SOLN
10.0000 mg | Freq: Once | INTRAVENOUS | Status: AC
  Administered 2022-12-18: 10 mg via INTRAVENOUS
  Filled 2022-12-18: qty 10

## 2022-12-18 MED ORDER — PALONOSETRON HCL INJECTION 0.25 MG/5ML
0.2500 mg | Freq: Once | INTRAVENOUS | Status: AC
  Administered 2022-12-18: 0.25 mg via INTRAVENOUS
  Filled 2022-12-18: qty 5

## 2022-12-18 MED ORDER — SODIUM CHLORIDE 0.9 % IV SOLN
400.0000 mg/m2 | Freq: Once | INTRAVENOUS | Status: AC
  Administered 2022-12-18: 980 mg via INTRAVENOUS
  Filled 2022-12-18: qty 49

## 2022-12-18 NOTE — Patient Instructions (Signed)
[facility]  Discharge Instructions: Thank you for choosing [facility] to provide your oncology and hematology care.   If you have a lab appointment with the Cancer Center, please go directly to the Cancer Center and check in at the registration area.   Wear comfortable clothing and clothing appropriate for easy access to any Portacath or PICC line.   We strive to give you quality time with your provider. You may need to reschedule your appointment if you arrive late (15 or more minutes).  Arriving late affects you and other patients whose appointments are after yours.  Also, if you miss three or more appointments without notifying the office, you may be dismissed from the clinic at the provider's discretion.      For prescription refill requests, have your pharmacy contact our office and allow 72 hours for refills to be completed.    Today you received the following chemotherapy and/or immunotherapy agents: Liposomal Irinotecan/Leucovorin/Fluorouracil    To help prevent nausea and vomiting after your treatment, we encourage you to take your nausea medication as directed.  BELOW ARE SYMPTOMS THAT SHOULD BE REPORTED IMMEDIATELY: *FEVER GREATER THAN 100.4 F (38 C) OR HIGHER *CHILLS OR SWEATING *NAUSEA AND VOMITING THAT IS NOT CONTROLLED WITH YOUR NAUSEA MEDICATION *UNUSUAL SHORTNESS OF BREATH *UNUSUAL BRUISING OR BLEEDING *URINARY PROBLEMS (pain or burning when urinating, or frequent urination) *BOWEL PROBLEMS (unusual diarrhea, constipation, pain near the anus) TENDERNESS IN MOUTH AND THROAT WITH OR WITHOUT PRESENCE OF ULCERS (sore throat, sores in mouth, or a toothache) UNUSUAL RASH, SWELLING OR PAIN  UNUSUAL VAGINAL DISCHARGE OR ITCHING   Items with * indicate a potential emergency and should be followed up as soon as possible or go to the Emergency Department if any problems should occur.  Please show the CHEMOTHERAPY ALERT CARD or  IMMUNOTHERAPY ALERT CARD at check-in to the Emergency Department and triage nurse.  Should you have questions after your visit or need to cancel or reschedule your appointment, please contact [facility]  Dept: [phone]  and follow the prompts.  Office hours are 8:00 a.m. to 4:30 p.m. Monday - Friday. Please note that voicemails left after 4:00 p.m. may not be returned until the following business day.  We are closed weekends and major holidays. You have access to a nurse at all times for urgent questions. Please call the main number to the clinic Dept: [phone] and follow the prompts.   For any non-urgent questions, you may also contact your provider using MyChart. We now offer e-Visits for anyone 71 and older to request care online for non-urgent symptoms. For details visit [email or web address].   Also download the MyChart app! Go to the app store, search "MyChart", open the app, select [facility], and log in with your MyChart username and password.

## 2022-12-19 LAB — CANCER ANTIGEN 19-9: CA 19-9: 39 U/mL — ABNORMAL HIGH (ref 0–35)

## 2022-12-20 ENCOUNTER — Other Ambulatory Visit: Payer: Self-pay

## 2022-12-20 ENCOUNTER — Inpatient Hospital Stay: Payer: BC Managed Care – PPO | Attending: Hematology

## 2022-12-20 VITALS — BP 138/91 | HR 58 | Temp 98.3°F | Resp 16

## 2022-12-20 DIAGNOSIS — C787 Secondary malignant neoplasm of liver and intrahepatic bile duct: Secondary | ICD-10-CM | POA: Diagnosis not present

## 2022-12-20 DIAGNOSIS — Z5111 Encounter for antineoplastic chemotherapy: Secondary | ICD-10-CM | POA: Diagnosis not present

## 2022-12-20 DIAGNOSIS — Z452 Encounter for adjustment and management of vascular access device: Secondary | ICD-10-CM | POA: Insufficient documentation

## 2022-12-20 DIAGNOSIS — R197 Diarrhea, unspecified: Secondary | ICD-10-CM | POA: Diagnosis not present

## 2022-12-20 DIAGNOSIS — C250 Malignant neoplasm of head of pancreas: Secondary | ICD-10-CM | POA: Insufficient documentation

## 2022-12-20 DIAGNOSIS — G620 Drug-induced polyneuropathy: Secondary | ICD-10-CM | POA: Diagnosis not present

## 2022-12-20 DIAGNOSIS — E876 Hypokalemia: Secondary | ICD-10-CM | POA: Diagnosis not present

## 2022-12-20 DIAGNOSIS — C259 Malignant neoplasm of pancreas, unspecified: Secondary | ICD-10-CM

## 2022-12-20 MED ORDER — SODIUM CHLORIDE 0.9% FLUSH
10.0000 mL | INTRAVENOUS | Status: DC | PRN
  Administered 2022-12-20: 10 mL

## 2022-12-20 MED ORDER — HEPARIN SOD (PORK) LOCK FLUSH 100 UNIT/ML IV SOLN
500.0000 [IU] | Freq: Once | INTRAVENOUS | Status: AC | PRN
  Administered 2022-12-20: 500 [IU]

## 2022-12-27 ENCOUNTER — Telehealth: Payer: Self-pay | Admitting: *Deleted

## 2022-12-27 MED ORDER — DIPHENOXYLATE-ATROPINE 2.5-0.025 MG PO TABS: 1.0000 | ORAL_TABLET | Freq: Four times a day (QID) | ORAL | 0 refills | Status: DC | PRN

## 2022-12-27 NOTE — Telephone Encounter (Signed)
Patient called with concerns of diarrhea since Friday morning. He reports diarrhea 4-5 times daily. He has been taking Imodium daily. It seems to slow the bowel movements but it has not resolved. Patient has increased fluid intake with Gatorade and water. His appetite has not been normal. Whenever he eats, he feels he has to go to the rest room right away. No change to urine color, frequency or consistency. He does not want to come to the lab or [facility]. He reports a 14lb weightloss per his home scale since Friday.   Discussed with Dr. Mosetta Putt- [place] called into [facility] per patient preference. Patient to increase fluid intake, monitor urine/hydration and call this office with any signs of dehydration.   Patient verbalized an understanding of the medication instructions and return to clinic instructions. Patient returns to clinic on Monday 5/13 for follow up labs and MD visit.

## 2022-12-29 MED FILL — Dexamethasone Sodium Phosphate Inj 100 MG/10ML: INTRAMUSCULAR | Qty: 1 | Status: AC

## 2022-12-31 NOTE — Assessment & Plan Note (Signed)
metastatic to liver, stage IV, KRAS G12R (+), MMR normal  -incidental finding on lung cancer screening chest CT on 01/19/22. 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  -He began first-line palliative FOLFIRINOX on 03/13/22, but unfortunately did not respond well. CA 19-9 also rose on treatment. -he switched to second line gemcitabine/abraxane on 06/05/22. He tolerates moderate well and has continued to work full time. CA 19-9 has dropped on treatment. -restaging CT from 08/24/22 showed partial response to treatment, improved primary tumor, and liver/node mets, I discussed with him -will continue current chemo   -Pt was referred to see Dr. Zani on 10/10/2022 , surgery was discussed but not felt to be a surgical candidate due to his multiple liver metastasis (5) -Restaging CT scan on November 24, 2022 showed moderate disease progression in liver and the pancreas, his tumor marker also increased.  Plan to change his chemotherapy to 5-FU and liposomal irinotecan, start today. 

## 2023-01-01 ENCOUNTER — Other Ambulatory Visit: Payer: Self-pay

## 2023-01-01 ENCOUNTER — Inpatient Hospital Stay: Payer: BC Managed Care – PPO

## 2023-01-01 ENCOUNTER — Inpatient Hospital Stay: Payer: BC Managed Care – PPO | Admitting: Hematology

## 2023-01-01 ENCOUNTER — Encounter: Payer: Self-pay | Admitting: Hematology

## 2023-01-01 VITALS — BP 118/83 | HR 77 | Temp 98.9°F | Resp 18 | Ht 72.0 in | Wt 249.0 lb

## 2023-01-01 DIAGNOSIS — Z95828 Presence of other vascular implants and grafts: Secondary | ICD-10-CM

## 2023-01-01 DIAGNOSIS — E876 Hypokalemia: Secondary | ICD-10-CM | POA: Diagnosis not present

## 2023-01-01 DIAGNOSIS — C259 Malignant neoplasm of pancreas, unspecified: Secondary | ICD-10-CM | POA: Diagnosis not present

## 2023-01-01 DIAGNOSIS — C250 Malignant neoplasm of head of pancreas: Secondary | ICD-10-CM | POA: Diagnosis not present

## 2023-01-01 DIAGNOSIS — G620 Drug-induced polyneuropathy: Secondary | ICD-10-CM | POA: Diagnosis not present

## 2023-01-01 DIAGNOSIS — Z452 Encounter for adjustment and management of vascular access device: Secondary | ICD-10-CM | POA: Diagnosis not present

## 2023-01-01 DIAGNOSIS — R197 Diarrhea, unspecified: Secondary | ICD-10-CM | POA: Diagnosis not present

## 2023-01-01 DIAGNOSIS — T451X5A Adverse effect of antineoplastic and immunosuppressive drugs, initial encounter: Secondary | ICD-10-CM

## 2023-01-01 DIAGNOSIS — C787 Secondary malignant neoplasm of liver and intrahepatic bile duct: Secondary | ICD-10-CM | POA: Diagnosis not present

## 2023-01-01 DIAGNOSIS — Z5111 Encounter for antineoplastic chemotherapy: Secondary | ICD-10-CM | POA: Diagnosis not present

## 2023-01-01 LAB — CBC WITH DIFFERENTIAL (CANCER CENTER ONLY)
Abs Immature Granulocytes: 0.05 10*3/uL (ref 0.00–0.07)
Basophils Absolute: 0.1 10*3/uL (ref 0.0–0.1)
Basophils Relative: 1 %
Eosinophils Absolute: 0.3 10*3/uL (ref 0.0–0.5)
Eosinophils Relative: 4 %
HCT: 34.7 % — ABNORMAL LOW (ref 39.0–52.0)
Hemoglobin: 12.3 g/dL — ABNORMAL LOW (ref 13.0–17.0)
Immature Granulocytes: 1 %
Lymphocytes Relative: 24 %
Lymphs Abs: 1.5 10*3/uL (ref 0.7–4.0)
MCH: 31 pg (ref 26.0–34.0)
MCHC: 35.4 g/dL (ref 30.0–36.0)
MCV: 87.4 fL (ref 80.0–100.0)
Monocytes Absolute: 1.2 10*3/uL — ABNORMAL HIGH (ref 0.1–1.0)
Monocytes Relative: 20 %
Neutro Abs: 3.1 10*3/uL (ref 1.7–7.7)
Neutrophils Relative %: 50 %
Platelet Count: 289 10*3/uL (ref 150–400)
RBC: 3.97 MIL/uL — ABNORMAL LOW (ref 4.22–5.81)
RDW: 15.2 % (ref 11.5–15.5)
WBC Count: 6.2 10*3/uL (ref 4.0–10.5)
nRBC: 0 % (ref 0.0–0.2)

## 2023-01-01 LAB — CMP (CANCER CENTER ONLY)
ALT: 28 U/L (ref 0–44)
AST: 16 U/L (ref 15–41)
Albumin: 3.5 g/dL (ref 3.5–5.0)
Alkaline Phosphatase: 74 U/L (ref 38–126)
Anion gap: 9 (ref 5–15)
BUN: 6 mg/dL (ref 6–20)
CO2: 25 mmol/L (ref 22–32)
Calcium: 8.2 mg/dL — ABNORMAL LOW (ref 8.9–10.3)
Chloride: 103 mmol/L (ref 98–111)
Creatinine: 0.84 mg/dL (ref 0.61–1.24)
GFR, Estimated: >60 mL/min (ref >60)
Glucose, Bld: 121 mg/dL — ABNORMAL HIGH (ref 70–99)
Potassium: 2.8 mmol/L — ABNORMAL LOW (ref 3.5–5.1)
Sodium: 137 mmol/L (ref 135–145)
Total Bilirubin: 0.5 mg/dL (ref 0.3–1.2)
Total Protein: 5.8 g/dL — ABNORMAL LOW (ref 6.5–8.1)

## 2023-01-01 MED ORDER — HEPARIN SOD (PORK) LOCK FLUSH 100 UNIT/ML IV SOLN
500.0000 [IU] | Freq: Once | INTRAVENOUS | Status: AC
  Administered 2023-01-01: 500 [IU]

## 2023-01-01 MED ORDER — SODIUM CHLORIDE 0.9% FLUSH
10.0000 mL | Freq: Once | INTRAVENOUS | Status: AC
  Administered 2023-01-01: 10 mL

## 2023-01-01 NOTE — Progress Notes (Addendum)
[facility]   Telephone:([phone] Fax:([phone]   Clinic Follow up Note   Patient Care Team: [name], MD as PCP - General (Family Medicine) [name], MD as Consulting Physician (Oncology) Tito Dine., MD as Consulting Physician (General Surgery)  Date of Service:  01/01/2023  CHIEF COMPLAINT: f/u of pancreatic cancer   CURRENT THERAPY:third line FOLFIRI with liposomal irinotecan q14 days, starting 12/04/22    ASSESSMENT:  Steve Mills is a 57 y.o. male with   Pancreatic cancer metastasized to liver [facility]) metastatic to liver, stage IV, KRAS G12R (+), MMR normal  -incidental finding on lung cancer screening chest CT on 01/19/22. 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  -He began first-line palliative FOLFIRINOX on 03/13/22, but unfortunately did not respond well. CA 19-9 also rose on treatment. -he switched to second line gemcitabine/abraxane on 06/05/22. He tolerates moderate well and has continued to work full time. CA 19-9 has dropped on treatment. -restaging CT from 08/24/22 showed partial response to treatment, improved primary tumor, and liver/node mets, I discussed with him -will continue current chemo   -Pt was referred to see Dr. [name] on 10/10/2022 , surgery was discussed but not felt to be a surgical candidate due to his multiple liver metastasis (5) -Restaging CT scan on November 24, 2022 showed moderate disease progression in liver and the pancreas, his tumor marker also increased.  I have changed his chemotherapy to 5-FU and liposomal irinotecan, started on 12/04/22 -He tolerated first cycle chemotherapy well, but developed significant diarrhea after cycle 2, still not resolved, will postpone his chemo for a week, and reduce his chemo dose next time.  Peripheral neuropathy due to chemotherapy (HCC) -Started in January 2024,  secondary to oxaliplatin and Abraxane -Overall just numbness in fingers and toes, mild tingling, will continue to watch closely -He is taking B12, and use ice packs during chemoinfusion.  -overall mild and stable     PLAN: -lab pending -deferred treatment today due to diarrhea  - Order stool testing to rule out c-diff  -Postpone cycle 3 to next week    SUMMARY OF ONCOLOGIC HISTORY: Oncology History Overview Note   Cancer Staging  Pancreatic cancer metastasized to liver [facility]) Staging form: Exocrine Pancreas, AJCC 8th Edition - Clinical stage from 02/28/2022: Stage IV (cT2, cN1, pM1) - Signed by [name], MD on 03/02/2022 Stage prefix: Initial diagnosis     Pancreatic cancer metastasized to liver (HCC)  02/28/2022 Cancer Staging   Staging form: Exocrine Pancreas, AJCC 8th Edition - Clinical stage from 02/28/2022: Stage IV (cT2, cN1, pM1) - Signed by [name], MD on 03/02/2022 Stage prefix: Initial diagnosis   03/02/2022 Initial Diagnosis   Pancreatic cancer metastasized to liver (HCC)   03/13/2022 - 04/13/2022 Chemotherapy   Patient is on Treatment Plan : PANCREAS Modified FOLFIRINOX q14d x 4 cycles     03/13/2022 - 05/17/2022 Chemotherapy   Patient is on Treatment Plan : PANCREAS Modified FOLFIRINOX q14d x 4 cycles     03/18/2022 Genetic Testing   Negative genetic testing on the CancerNext-Expanded+RNAinsight panel.  APC c.4847A>T VUs identified.  The report date is March 18, 2022.  The CancerNext-Expanded gene panel offered by [facility] and includes sequencing and rearrangement analysis for the following 77 genes: AIP, ALK, APC*, ATM*, AXIN2, BAP1, BARD1, BLM, BMPR1A, BRCA1*, BRCA2*, BRIP1*, CDC73, CDH1*, CDK4, CDKN1B, CDKN2A, CHEK2*, CTNNA1, DICER1,  FANCC, FH, FLCN, GALNT12, KIF1B, LZTR1, MAX, MEN1, MET, MLH1*, MSH2*, MSH3, MSH6*, MUTYH*, NBN, NF1*, NF2, NTHL1, PALB2*, PHOX2B, PMS2*, POT1, PRKAR1A, PTCH1, PTEN*, RAD51C*, RAD51D*, RB1, RECQL, RET, SDHA, SDHAF2, SDHB, SDHC, SDHD,  SMAD4, SMARCA4, SMARCB1, SMARCE1, STK11, SUFU, TMEM127, TP53*, TSC1, TSC2, VHL and XRCC2 (sequencing and deletion/duplication); EGFR, EGLN1, HOXB13, KIT, MITF, PDGFRA, POLD1, and POLE (sequencing only); EPCAM and GREM1 (deletion/duplication only). DNA and RNA analyses performed for * genes.    05/25/2022 Progression   Rising CA 19-9, CT CAP IMPRESSION: 1. Multiple new and enlarged hypodense, rim enhancing liver metastases. 2. Unchanged, ill-defined hypoenhancing mass of the pancreatic uncinate, consistent with pancreatic adenocarcinoma 3. Unchanged small prominent portacaval and gastrohepatic ligament nodes, modestly suspicious for nodal metastases. No overtly enlarged lymph nodes in the abdomen or pelvis. 4. Prostatomegaly.    06/05/2022 - 11/13/2022 Chemotherapy   Patient is on Treatment Plan : PANCREATIC Abraxane D1,8,15 + Gemcitabine D1,8,15 q28d     11/24/2022 Imaging   IMPRESSION: 1. Today's study demonstrates mild progression of disease as evidenced by slight enlargement of the mass in the uncinate process of the pancreas which demonstrates potential direct invasion into the superior wall of the third portion of the duodenum, but currently demonstrates no definite vascular involvement. Multiple hepatic lesions are generally stable to the prior study, with exception of enlargement of a lesion in segment 8, as detailed above. 2. No new sites of metastatic disease are noted elsewhere in the chest or pelvis. 3. Aortic atherosclerosis.  Rising CA 19-9    12/04/2022 -  Chemotherapy   Patient is on Treatment Plan : PANCREAS Liposomal Irinotecan + Leucovorin + 5-FU IVCI q14d        INTERVAL HISTORY:  Steve Mills is here for a follow up of pancreatic cancer. He was last seen by me on 12/18/2022. He presents to the clinic accompanied by wife. Pt state that he has some appetite, but he has some diarrhea since last cycle.He has went about 10 times.Pt stated that he took the steroid when he got  nauseous. Pt report since starting chemo he has not felt bad except the day after chemo. Pt stated that the diarrhea started a week ago.Pt never had a problem with diarrhea since starting Chemo. Pt  is taking Imodium for the Diarrhea. Pt state that he has some heart burn  right before having diarrhea. Pt denies having dizziness.      All other systems were reviewed with the patient and are negative.  MEDICAL HISTORY:  Past Medical History:  Diagnosis Date   Family history of adverse reaction to anesthesia    mother allergic to ether    Family history of breast cancer    Family history of colon cancer    Pneumonia    hx of 2014     SURGICAL HISTORY: Past Surgical History:  Procedure Laterality Date   APPENDECTOMY     CHOLECYSTECTOMY N/A 07/12/2018   Procedure: LAPAROSCOPIC CHOLECYSTECTOMY WITH INTRAOPERATIVE CHOLANGIOGRAM;  Surgeon: Luretha Murphy, MD;  Location: [facility]S;  Service: General;  Laterality: N/A;   left wrist surgery      has plate in arm    NASAL POLYP SURGERY  2017   PORTACATH PLACEMENT N/A 03/10/2022   Procedure: INSERTION PORT-A-CATH;  Surgeon: Fritzi Mandes, MD;  Location: [facility] OR;  Service: General;  Laterality: N/A;   VENTRAL HERNIA REPAIR N/A 07/15/2014   Procedure: LAPAROSCOPIC REPAIR INCARCARTED UMBILICAL  VENTRAL HERNIA, ;  Surgeon: Claud Kelp, MD;  Location: [facility]  ORS;  Service: General;  Laterality: N/A;  With MESH    I have reviewed the social history and family history with the patient and they are unchanged from previous note.  ALLERGIES:  has No Known Allergies.  MEDICATIONS:  Current Outpatient Medications  Medication Sig Dispense Refill   dexamethasone (DECADRON) 4 MG tablet Take 2 tablets (8 mg total) by mouth daily. Start the day after irinotecan chemotherapy for 2 days. Take with food. (Patient not taking: Reported on 12/18/2022) 8 tablet 5   diphenoxylate-atropine (LOMOTIL) 2.5-0.025 MG tablet Take 1 tablet by mouth 4 (four) times daily as  needed for diarrhea or loose stools. 30 tablet 0   lactobacillus acidophilus & bulgar (LACTINEX) chewable tablet CHEW 1 TABLET BY MOUTH 3 (THREE) TIMES DAILY WITH MEALS. (Patient not taking: Reported on 12/18/2022) 40 tablet 0   lidocaine-prilocaine (EMLA) cream Apply to affected area once 30 g 3   ondansetron (ZOFRAN) 8 MG tablet Take 1 tablet (8 mg total) by mouth every 8 (eight) hours as needed for nausea or vomiting. Start on the third day after irinotecan 30 tablet 1   prochlorperazine (COMPAZINE) 10 MG tablet Take 1 tablet (10 mg total) by mouth every 6 (six) hours as needed for nausea or vomiting. 30 tablet 1   No current facility-administered medications for this visit.    PHYSICAL EXAMINATION: ECOG PERFORMANCE STATUS: 1 - Symptomatic but completely ambulatory  Vitals:   01/01/23 1119  BP: 118/83  Pulse: 77  Resp: 18  Temp: 98.9 F (37.2 C)  SpO2: 95%   Wt Readings from Last 3 Encounters:  01/01/23 249 lb (112.9 kg)  12/18/22 254 lb 14.4 oz (115.6 kg)  12/04/22 261 lb 6.4 oz (118.6 kg)     GENERAL:alert, no distress and comfortable SKIN: skin color normal, no rashes or significant lesions EYES: normal, Conjunctiva are pink and non-injected, sclera clear  NEURO: alert & oriented x 3 with fluent speech LABORATORY DATA:  I have reviewed the data as listed    Latest Ref Rng & Units 01/01/2023   10:54 AM 12/18/2022   10:31 AM 12/04/2022   10:20 AM  CBC  WBC 4.0 - 10.5 K/uL 6.2  7.1  7.0   Hemoglobin 13.0 - 17.0 g/dL 16.1  09.6  04.5   Hematocrit 39.0 - 52.0 % 34.7  37.5  37.6   Platelets 150 - 400 K/uL 289  209  415         Latest Ref Rng & Units 01/01/2023   10:54 AM 12/18/2022   10:31 AM 12/04/2022   10:20 AM  CMP  Glucose 70 - 99 mg/dL 409  811  914   BUN 6 - 20 mg/dL 6  14  13    Creatinine 0.61 - 1.24 mg/dL 7.82  9.56  2.13   Sodium 135 - 145 mmol/L 137  142  138   Potassium 3.5 - 5.1 mmol/L 2.8  3.8  4.0   Chloride 98 - 111 mmol/L 103  108  106   CO2 22 - 32  mmol/L 25  26  26    Calcium 8.9 - 10.3 mg/dL 8.2  9.3  9.6   Total Protein 6.5 - 8.1 g/dL 5.8  6.5  7.1   Total Bilirubin 0.3 - 1.2 mg/dL 0.5  0.6  0.6   Alkaline Phos 38 - 126 U/L 74  70  74   AST 15 - 41 U/L 16  19  22    ALT 0 - 44 U/L 28  21  19       RADIOGRAPHIC STUDIES: I have personally reviewed the radiological images as listed and agreed with the findings in the report. No results found.    Orders Placed This Encounter  Procedures   C difficile quick screen w PCR reflex    Standing Status:   Future    Standing Expiration Date:   01/01/2024   CBC with Differential (Cancer Center Only)    Standing Status:   Future    Standing Expiration Date:   01/08/2024   CMP (Cancer Center only)    Standing Status:   Future    Standing Expiration Date:   01/08/2024   CBC with Differential (Cancer Center Only)    Standing Status:   Future    Standing Expiration Date:   02/05/2024   CMP (Cancer Center only)    Standing Status:   Future    Standing Expiration Date:   02/05/2024   All questions were answered. The patient knows to call the clinic with any problems, questions or concerns. No barriers to learning was detected. The total time spent in the appointment was 25 minutes.     [name], MD 01/01/2023   Carolin Coy, CMA, am acting as scribe for [name], MD.   I have reviewed the above documentation for accuracy and completeness, and I agree with the above.

## 2023-01-02 ENCOUNTER — Other Ambulatory Visit: Payer: Self-pay

## 2023-01-02 ENCOUNTER — Telehealth: Payer: Self-pay

## 2023-01-02 DIAGNOSIS — G620 Drug-induced polyneuropathy: Secondary | ICD-10-CM | POA: Diagnosis not present

## 2023-01-02 DIAGNOSIS — R197 Diarrhea, unspecified: Secondary | ICD-10-CM | POA: Diagnosis not present

## 2023-01-02 DIAGNOSIS — Z5111 Encounter for antineoplastic chemotherapy: Secondary | ICD-10-CM | POA: Diagnosis not present

## 2023-01-02 DIAGNOSIS — C250 Malignant neoplasm of head of pancreas: Secondary | ICD-10-CM | POA: Diagnosis not present

## 2023-01-02 DIAGNOSIS — Z452 Encounter for adjustment and management of vascular access device: Secondary | ICD-10-CM | POA: Diagnosis not present

## 2023-01-02 DIAGNOSIS — C787 Secondary malignant neoplasm of liver and intrahepatic bile duct: Secondary | ICD-10-CM | POA: Diagnosis not present

## 2023-01-02 DIAGNOSIS — E876 Hypokalemia: Secondary | ICD-10-CM | POA: Diagnosis not present

## 2023-01-02 LAB — CANCER ANTIGEN 19-9: CA 19-9: 25 U/mL (ref 0–35)

## 2023-01-02 LAB — C DIFFICILE QUICK SCREEN W PCR REFLEX
C Diff antigen: NEGATIVE
C Diff interpretation: NOT DETECTED
C Diff toxin: NEGATIVE

## 2023-01-02 MED ORDER — POTASSIUM CHLORIDE CRYS ER 20 MEQ PO TBCR: 20.0000 meq | EXTENDED_RELEASE_TABLET | Freq: Two times a day (BID) | ORAL | 0 refills | Status: DC

## 2023-01-02 NOTE — Telephone Encounter (Addendum)
Called patient and relayed message below as per Dr. Mosetta Putt, patient voiced understanding, sent in order for potassium to pharmacy.    ----- Message from [name], MD sent at 01/02/2023 11:27 AM EDT ----- Please let pt know his K is low, probably related to his diarrhea, please call in potassium 20 mEq twice daily for 5 days, then once daily.  [name]  01/02/2023

## 2023-01-03 ENCOUNTER — Inpatient Hospital Stay: Payer: BC Managed Care – PPO

## 2023-01-03 DIAGNOSIS — C259 Malignant neoplasm of pancreas, unspecified: Secondary | ICD-10-CM | POA: Diagnosis not present

## 2023-01-03 DIAGNOSIS — C787 Secondary malignant neoplasm of liver and intrahepatic bile duct: Secondary | ICD-10-CM | POA: Diagnosis not present

## 2023-01-04 ENCOUNTER — Other Ambulatory Visit: Payer: Self-pay

## 2023-01-05 MED FILL — Dexamethasone Sodium Phosphate Inj 100 MG/10ML: INTRAMUSCULAR | Qty: 1 | Status: AC

## 2023-01-07 NOTE — Progress Notes (Unsigned)
Patient Care Team: [name], MD as PCP - General (Family Medicine) [name], MD as Consulting Physician (Oncology) [name], Revonda Standard., MD as Consulting Physician (General Surgery)   CHIEF COMPLAINT: Follow up pancreatic cancer   Oncology History Overview Note   Cancer Staging  Pancreatic cancer metastasized to liver [facility]) Staging form: Exocrine Pancreas, AJCC 8th Edition - Clinical stage from 02/28/2022: Stage IV (cT2, cN1, pM1) - Signed by [name], MD on 03/02/2022 Stage prefix: Initial diagnosis     Pancreatic cancer metastasized to liver (HCC)  02/28/2022 Cancer Staging   Staging form: Exocrine Pancreas, AJCC 8th Edition - Clinical stage from 02/28/2022: Stage IV (cT2, cN1, pM1) - Signed by [name], MD on 03/02/2022 Stage prefix: Initial diagnosis   03/02/2022 Initial Diagnosis   Pancreatic cancer metastasized to liver (HCC)   03/13/2022 - 04/13/2022 Chemotherapy   Patient is on Treatment Plan : PANCREAS Modified FOLFIRINOX q14d x 4 cycles     03/13/2022 - 05/17/2022 Chemotherapy   Patient is on Treatment Plan : PANCREAS Modified FOLFIRINOX q14d x 4 cycles     03/18/2022 Genetic Testing   Negative genetic testing on the CancerNext-Expanded+RNAinsight panel.  APC c.4847A>T VUs identified.  The report date is March 18, 2022.  The CancerNext-Expanded gene panel offered by [facility] and includes sequencing and rearrangement analysis for the following 77 genes: AIP, ALK, APC*, ATM*, AXIN2, BAP1, BARD1, BLM, BMPR1A, BRCA1*, BRCA2*, BRIP1*, CDC73, CDH1*, CDK4, CDKN1B, CDKN2A, CHEK2*, CTNNA1, DICER1, FANCC, FH, FLCN, GALNT12, KIF1B, LZTR1, MAX, MEN1, MET, MLH1*, MSH2*, MSH3, MSH6*, MUTYH*, NBN, NF1*, NF2, NTHL1, PALB2*, PHOX2B, PMS2*, POT1, PRKAR1A, PTCH1, PTEN*, RAD51C*, RAD51D*, RB1, RECQL, RET, SDHA, SDHAF2, SDHB, SDHC, SDHD, SMAD4, SMARCA4, SMARCB1, SMARCE1, STK11, SUFU, TMEM127, TP53*, TSC1, TSC2, VHL and XRCC2 (sequencing and deletion/duplication); EGFR, EGLN1, HOXB13,  KIT, MITF, PDGFRA, POLD1, and POLE (sequencing only); EPCAM and GREM1 (deletion/duplication only). DNA and RNA analyses performed for * genes.    05/25/2022 Progression   Rising CA 19-9, CT CAP IMPRESSION: 1. Multiple new and enlarged hypodense, rim enhancing liver metastases. 2. Unchanged, ill-defined hypoenhancing mass of the pancreatic uncinate, consistent with pancreatic adenocarcinoma 3. Unchanged small prominent portacaval and gastrohepatic ligament nodes, modestly suspicious for nodal metastases. No overtly enlarged lymph nodes in the abdomen or pelvis. 4. Prostatomegaly.    06/05/2022 - 11/13/2022 Chemotherapy   Patient is on Treatment Plan : PANCREATIC Abraxane D1,8,15 + Gemcitabine D1,8,15 q28d     11/24/2022 Imaging   IMPRESSION: 1. Today's study demonstrates mild progression of disease as evidenced by slight enlargement of the mass in the uncinate process of the pancreas which demonstrates potential direct invasion into the superior wall of the third portion of the duodenum, but currently demonstrates no definite vascular involvement. Multiple hepatic lesions are generally stable to the prior study, with exception of enlargement of a lesion in segment 8, as detailed above. 2. No new sites of metastatic disease are noted elsewhere in the chest or pelvis. 3. Aortic atherosclerosis.  Rising CA 19-9    12/04/2022 -  Chemotherapy   Patient is on Treatment Plan : PANCREAS Liposomal Irinotecan + Leucovorin + 5-FU IVCI q14d        CURRENT THERAPY: third line FOLFIRI with liposomal irinotecan q14 days, starting 12/04/22   INTERVAL HISTORY Mr. Grannan returns for follow up as scheduled, last seen by Dr. Mosetta Putt 01/01/23. C3 was postponed due to unresolved diarrhea, and will be dose reduced.   ROS   Past Medical History:  Diagnosis Date  Family history of adverse reaction to anesthesia    mother allergic to ether    Family history of breast cancer    Family history of colon cancer     Pneumonia    hx of 2014      Past Surgical History:  Procedure Laterality Date   APPENDECTOMY     CHOLECYSTECTOMY N/A 07/12/2018   Procedure: LAPAROSCOPIC CHOLECYSTECTOMY WITH INTRAOPERATIVE CHOLANGIOGRAM;  Surgeon: Luretha Murphy, MD;  Location: [facility];  Service: General;  Laterality: N/A;   left wrist surgery      has plate in arm    NASAL POLYP SURGERY  2017   PORTACATH PLACEMENT N/A 03/10/2022   Procedure: INSERTION PORT-A-CATH;  Surgeon: Fritzi Mandes, MD;  Location: [facility] OR;  Service: General;  Laterality: N/A;   VENTRAL HERNIA REPAIR N/A 07/15/2014   Procedure: LAPAROSCOPIC REPAIR INCARCARTED UMBILICAL  VENTRAL HERNIA, ;  Surgeon: Claud Kelp, MD;  Location: [facility];  Service: General;  Laterality: N/A;  With MESH     Outpatient Encounter Medications as of 01/08/2023  Medication Sig   dexamethasone (DECADRON) 4 MG tablet Take 2 tablets (8 mg total) by mouth daily. Start the day after irinotecan chemotherapy for 2 days. Take with food. (Patient not taking: Reported on 12/18/2022)   diphenoxylate-atropine (LOMOTIL) 2.5-0.025 MG tablet Take 1 tablet by mouth 4 (four) times daily as needed for diarrhea or loose stools.   lactobacillus acidophilus & bulgar (LACTINEX) chewable tablet CHEW 1 TABLET BY MOUTH 3 (THREE) TIMES DAILY WITH MEALS. (Patient not taking: Reported on 12/18/2022)   lidocaine-prilocaine (EMLA) cream Apply to affected area once   ondansetron (ZOFRAN) 8 MG tablet Take 1 tablet (8 mg total) by mouth every 8 (eight) hours as needed for nausea or vomiting. Start on the third day after irinotecan   potassium chloride SA (KLOR-CON M) 20 MEQ tablet Take 1 tablet (20 mEq total) by mouth 2 (two) times daily. Take 1 tablet by mouth 2 times a day for 5 days then take 1 tablet daily   prochlorperazine (COMPAZINE) 10 MG tablet Take 1 tablet (10 mg total) by mouth every 6 (six) hours as needed for nausea or vomiting.   No facility-administered encounter medications on file as of  01/08/2023.     There were no vitals filed for this visit. There is no height or weight on file to calculate BMI.   PHYSICAL EXAM GENERAL:alert, no distress and comfortable SKIN: no rash  EYES: sclera clear NECK: without mass LYMPH:  no palpable cervical or supraclavicular lymphadenopathy  LUNGS: clear with normal breathing effort HEART: regular rate & rhythm, no lower extremity edema ABDOMEN: abdomen soft, non-tender and normal bowel sounds NEURO: alert & oriented x 3 with fluent speech, no focal motor/sensory deficits Breast exam:  PAC without erythema    CBC    Component Value Date/Time   WBC 6.2 01/01/2023 1054   WBC 10.4 08/14/2022 0019   RBC 3.97 (L) 01/01/2023 1054   HGB 12.3 (L) 01/01/2023 1054   HCT 34.7 (L) 01/01/2023 1054   PLT 289 01/01/2023 1054   MCV 87.4 01/01/2023 1054   MCH 31.0 01/01/2023 1054   MCHC 35.4 01/01/2023 1054   RDW 15.2 01/01/2023 1054   LYMPHSABS 1.5 01/01/2023 1054   MONOABS 1.2 (H) 01/01/2023 1054   EOSABS 0.3 01/01/2023 1054   BASOSABS 0.1 01/01/2023 1054     CMP     Component Value Date/Time   NA 137 01/01/2023 1054   K 2.8 (L) 01/01/2023  1054   CL 103 01/01/2023 1054   CO2 25 01/01/2023 1054   GLUCOSE 121 (H) 01/01/2023 1054   BUN 6 01/01/2023 1054   CREATININE 0.84 01/01/2023 1054   CALCIUM 8.2 (L) 01/01/2023 1054   PROT 5.8 (L) 01/01/2023 1054   ALBUMIN 3.5 01/01/2023 1054   AST 16 01/01/2023 1054   ALT 28 01/01/2023 1054   ALKPHOS 74 01/01/2023 1054   BILITOT 0.5 01/01/2023 1054   GFRNONAA >60 01/01/2023 1054   GFRAA >60 07/12/2018 0448     ASSESSMENT & PLAN:Jane D Tunks is a 57 y.o. male with    1. Pancreatic Head Adenocarcinoma, metastatic to liver, stage IV, MMR normal  -incidental finding on lung cancer screening chest CT on 01/19/22. 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  -liver biopsy on 02/28/22  confirmed poorly differentiated adenocarcinoma, compatible with clinical suspicion of pancreatic adenocarcinoma. MMR normal, not a candidate for immunotherapy.  -FO showed KRAS mutation, and MSS, no immunotherapy or targeted therapy available. BRCA1/2 was negative, not a candidate for PARP inhibitor  -He progressed after 5 cycles first-line palliative FOLFIRINOX 03/13/22 - 05/2022, and after 8 cycles second line gem/abraxane 05/2022 - 11/27/2022  -He met with Dr. [name] at [facility], not a surgical candidate due to metastatic disease in the liver (at least 5 lesions) -Began third line FOLFIRI with liposomal irinotecan q14 days starting 12/04/22 -He had prolonged diarrhea with C2 which was postponed to today, will also dose reduce   2. Genetics -he has a strong family history of cancer, specifically liver and lung on the paternal side and breast and colon on the maternal side. -testing performed 03/08/22, results are negative.    PLAN:  No orders of the defined types were placed in this encounter.     All questions were answered. The patient knows to call the clinic with any problems, questions or concerns. No barriers to learning were detected. I spent *** counseling the patient face to face. The total time spent in the appointment was *** and more than 50% was on counseling, review of test results, and coordination of care.   Santiago Glad, NP-C @DATE @

## 2023-01-08 ENCOUNTER — Encounter: Payer: Self-pay | Admitting: Nurse Practitioner

## 2023-01-08 ENCOUNTER — Ambulatory Visit: Payer: BC Managed Care – PPO

## 2023-01-08 ENCOUNTER — Other Ambulatory Visit: Payer: Self-pay

## 2023-01-08 ENCOUNTER — Inpatient Hospital Stay: Payer: BC Managed Care – PPO

## 2023-01-08 ENCOUNTER — Inpatient Hospital Stay: Payer: BC Managed Care – PPO | Admitting: Nurse Practitioner

## 2023-01-08 DIAGNOSIS — G620 Drug-induced polyneuropathy: Secondary | ICD-10-CM | POA: Diagnosis not present

## 2023-01-08 DIAGNOSIS — C787 Secondary malignant neoplasm of liver and intrahepatic bile duct: Secondary | ICD-10-CM

## 2023-01-08 DIAGNOSIS — C250 Malignant neoplasm of head of pancreas: Secondary | ICD-10-CM | POA: Diagnosis not present

## 2023-01-08 DIAGNOSIS — C259 Malignant neoplasm of pancreas, unspecified: Secondary | ICD-10-CM | POA: Diagnosis not present

## 2023-01-08 DIAGNOSIS — Z5111 Encounter for antineoplastic chemotherapy: Secondary | ICD-10-CM | POA: Diagnosis not present

## 2023-01-08 DIAGNOSIS — E876 Hypokalemia: Secondary | ICD-10-CM | POA: Diagnosis not present

## 2023-01-08 DIAGNOSIS — Z95828 Presence of other vascular implants and grafts: Secondary | ICD-10-CM

## 2023-01-08 DIAGNOSIS — Z452 Encounter for adjustment and management of vascular access device: Secondary | ICD-10-CM | POA: Diagnosis not present

## 2023-01-08 DIAGNOSIS — R197 Diarrhea, unspecified: Secondary | ICD-10-CM | POA: Diagnosis not present

## 2023-01-08 LAB — CMP (CANCER CENTER ONLY)
ALT: 16 U/L (ref 0–44)
AST: 15 U/L (ref 15–41)
Albumin: 3.5 g/dL (ref 3.5–5.0)
Alkaline Phosphatase: 80 U/L (ref 38–126)
Anion gap: 6 (ref 5–15)
BUN: 8 mg/dL (ref 6–20)
CO2: 29 mmol/L (ref 22–32)
Calcium: 8.8 mg/dL — ABNORMAL LOW (ref 8.9–10.3)
Chloride: 103 mmol/L (ref 98–111)
Creatinine: 0.82 mg/dL (ref 0.61–1.24)
GFR, Estimated: >60 mL/min (ref >60)
Glucose, Bld: 102 mg/dL — ABNORMAL HIGH (ref 70–99)
Potassium: 4.4 mmol/L (ref 3.5–5.1)
Sodium: 138 mmol/L (ref 135–145)
Total Bilirubin: 0.3 mg/dL (ref 0.3–1.2)
Total Protein: 5.8 g/dL — ABNORMAL LOW (ref 6.5–8.1)

## 2023-01-08 LAB — CBC WITH DIFFERENTIAL (CANCER CENTER ONLY)
Abs Immature Granulocytes: 0.12 10*3/uL — ABNORMAL HIGH (ref 0.00–0.07)
Basophils Absolute: 0.1 10*3/uL (ref 0.0–0.1)
Basophils Relative: 1 %
Eosinophils Absolute: 0.6 10*3/uL — ABNORMAL HIGH (ref 0.0–0.5)
Eosinophils Relative: 7 %
HCT: 36.7 % — ABNORMAL LOW (ref 39.0–52.0)
Hemoglobin: 12.4 g/dL — ABNORMAL LOW (ref 13.0–17.0)
Immature Granulocytes: 1 %
Lymphocytes Relative: 30 %
Lymphs Abs: 2.7 10*3/uL (ref 0.7–4.0)
MCH: 30.7 pg (ref 26.0–34.0)
MCHC: 33.8 g/dL (ref 30.0–36.0)
MCV: 90.8 fL (ref 80.0–100.0)
Monocytes Absolute: 1 10*3/uL (ref 0.1–1.0)
Monocytes Relative: 12 %
Neutro Abs: 4.2 10*3/uL (ref 1.7–7.7)
Neutrophils Relative %: 49 %
Platelet Count: 334 10*3/uL (ref 150–400)
RBC: 4.04 MIL/uL — ABNORMAL LOW (ref 4.22–5.81)
RDW: 15.2 % (ref 11.5–15.5)
WBC Count: 8.8 10*3/uL (ref 4.0–10.5)
nRBC: 0 % (ref 0.0–0.2)

## 2023-01-08 MED ORDER — SODIUM CHLORIDE 0.9 % IV SOLN
10.0000 mg | Freq: Once | INTRAVENOUS | Status: AC
  Administered 2023-01-08: 10 mg via INTRAVENOUS
  Filled 2023-01-08: qty 10
  Filled 2023-01-08: qty 1

## 2023-01-08 MED ORDER — ATROPINE SULFATE 1 MG/ML IV SOLN
0.5000 mg | Freq: Once | INTRAVENOUS | Status: DC | PRN
  Filled 2023-01-08: qty 1

## 2023-01-08 MED ORDER — SODIUM CHLORIDE 0.9 % IV SOLN
60.0000 mg/m2 | Freq: Once | INTRAVENOUS | Status: AC
  Administered 2023-01-08: 146.2 mg via INTRAVENOUS
  Filled 2023-01-08: qty 34

## 2023-01-08 MED ORDER — SODIUM CHLORIDE 0.9 % IV SOLN: Freq: Once | INTRAVENOUS | Status: AC

## 2023-01-08 MED ORDER — SODIUM CHLORIDE 0.9% FLUSH: 10.0000 mL | INTRAVENOUS | Status: DC | PRN

## 2023-01-08 MED ORDER — SODIUM CHLORIDE 0.9% FLUSH
10.0000 mL | Freq: Once | INTRAVENOUS | Status: AC
  Administered 2023-01-08: 10 mL

## 2023-01-08 MED ORDER — LIDOCAINE-PRILOCAINE 2.5-2.5 % EX CREA
TOPICAL_CREAM | CUTANEOUS | 3 refills | Status: DC
Start: 2023-01-08 — End: 2023-06-04

## 2023-01-08 MED ORDER — HEPARIN SOD (PORK) LOCK FLUSH 100 UNIT/ML IV SOLN: 500.0000 [IU] | Freq: Once | INTRAVENOUS | Status: DC | PRN

## 2023-01-08 MED ORDER — SODIUM CHLORIDE 0.9 % IV SOLN
2400.0000 mg/m2 | INTRAVENOUS | Status: DC
  Administered 2023-01-08: 5900 mg via INTRAVENOUS
  Filled 2023-01-08: qty 118

## 2023-01-08 MED ORDER — SODIUM CHLORIDE 0.9 % IV SOLN
400.0000 mg/m2 | Freq: Once | INTRAVENOUS | Status: AC
  Administered 2023-01-08: 980 mg via INTRAVENOUS
  Filled 2023-01-08: qty 49

## 2023-01-08 MED ORDER — PALONOSETRON HCL INJECTION 0.25 MG/5ML
0.2500 mg | Freq: Once | INTRAVENOUS | Status: AC
  Administered 2023-01-08: 0.25 mg via INTRAVENOUS
  Filled 2023-01-08: qty 5

## 2023-01-08 NOTE — Progress Notes (Signed)
Per Santiago Glad, NP, pt instructed to stop taking oral potassium and to resume taking it once daily if diarrhea returns.  Patient verbalized understanding.

## 2023-01-08 NOTE — Patient Instructions (Signed)
[place] [facility]  Discharge Instructions: Thank you for choosing [facility] to provide your oncology and hematology care.   If you have a lab appointment with the Cancer Center, please go directly to the Cancer Center and check in at the registration area.   Wear comfortable clothing and clothing appropriate for easy access to any Portacath or PICC line.   We strive to give you quality time with your provider. You may need to reschedule your appointment if you arrive late (15 or more minutes).  Arriving late affects you and other patients whose appointments are after yours.  Also, if you miss three or more appointments without notifying the office, you may be dismissed from the clinic at the provider's discretion.      For prescription refill requests, have your pharmacy contact our office and allow 72 hours for refills to be completed.    Today you received the following chemotherapy and/or immunotherapy agents: Onivyde, 5FU      To help prevent nausea and vomiting after your treatment, we encourage you to take your nausea medication as directed.  BELOW ARE SYMPTOMS THAT SHOULD BE REPORTED IMMEDIATELY: *FEVER GREATER THAN 100.4 F (38 C) OR HIGHER *CHILLS OR SWEATING *NAUSEA AND VOMITING THAT IS NOT CONTROLLED WITH YOUR NAUSEA MEDICATION *UNUSUAL SHORTNESS OF BREATH *UNUSUAL BRUISING OR BLEEDING *URINARY PROBLEMS (pain or burning when urinating, or frequent urination) *BOWEL PROBLEMS (unusual diarrhea, constipation, pain near the anus) TENDERNESS IN MOUTH AND THROAT WITH OR WITHOUT PRESENCE OF ULCERS (sore throat, sores in mouth, or a toothache) UNUSUAL RASH, SWELLING OR PAIN  UNUSUAL VAGINAL DISCHARGE OR ITCHING   Items with * indicate a potential emergency and should be followed up as soon as possible or go to the Emergency Department if any problems should occur.  Please show the CHEMOTHERAPY ALERT CARD or IMMUNOTHERAPY ALERT CARD at  check-in to the Emergency Department and triage nurse.  Should you have questions after your visit or need to cancel or reschedule your appointment, please contact [place] [facility]  Dept: [phone]  and follow the prompts.  Office hours are 8:00 a.m. to 4:30 p.m. Monday - Friday. Please note that voicemails left after 4:00 p.m. may not be returned until the following business day.  We are closed weekends and major holidays. You have access to a nurse at all times for urgent questions. Please call the main number to the clinic Dept: [phone] and follow the prompts.   For any non-urgent questions, you may also contact your provider using MyChart. We now offer e-Visits for anyone 80 and older to request care online for non-urgent symptoms. For details visit [email or web address].   Also download the MyChart app! Go to the app store, search "MyChart", open the app, select , and log in with your MyChart username and password.

## 2023-01-10 ENCOUNTER — Inpatient Hospital Stay: Payer: BC Managed Care – PPO

## 2023-01-10 VITALS — BP 114/83 | HR 63 | Temp 98.6°F | Resp 16

## 2023-01-10 DIAGNOSIS — C250 Malignant neoplasm of head of pancreas: Secondary | ICD-10-CM | POA: Diagnosis not present

## 2023-01-10 DIAGNOSIS — C259 Malignant neoplasm of pancreas, unspecified: Secondary | ICD-10-CM

## 2023-01-10 DIAGNOSIS — C787 Secondary malignant neoplasm of liver and intrahepatic bile duct: Secondary | ICD-10-CM | POA: Diagnosis not present

## 2023-01-10 DIAGNOSIS — R197 Diarrhea, unspecified: Secondary | ICD-10-CM | POA: Diagnosis not present

## 2023-01-10 DIAGNOSIS — G620 Drug-induced polyneuropathy: Secondary | ICD-10-CM | POA: Diagnosis not present

## 2023-01-10 DIAGNOSIS — E876 Hypokalemia: Secondary | ICD-10-CM | POA: Diagnosis not present

## 2023-01-10 DIAGNOSIS — Z95828 Presence of other vascular implants and grafts: Secondary | ICD-10-CM

## 2023-01-10 DIAGNOSIS — Z5111 Encounter for antineoplastic chemotherapy: Secondary | ICD-10-CM | POA: Diagnosis not present

## 2023-01-10 DIAGNOSIS — Z452 Encounter for adjustment and management of vascular access device: Secondary | ICD-10-CM | POA: Diagnosis not present

## 2023-01-10 MED ORDER — HEPARIN SOD (PORK) LOCK FLUSH 100 UNIT/ML IV SOLN
500.0000 [IU] | Freq: Once | INTRAVENOUS | Status: AC
  Administered 2023-01-10: 500 [IU]

## 2023-01-10 MED ORDER — SODIUM CHLORIDE 0.9% FLUSH
10.0000 mL | Freq: Once | INTRAVENOUS | Status: AC
  Administered 2023-01-10: 10 mL

## 2023-01-12 ENCOUNTER — Other Ambulatory Visit: Payer: Self-pay

## 2023-01-16 ENCOUNTER — Other Ambulatory Visit: Payer: BC Managed Care – PPO

## 2023-01-16 ENCOUNTER — Ambulatory Visit: Payer: BC Managed Care – PPO | Admitting: Nurse Practitioner

## 2023-01-16 ENCOUNTER — Ambulatory Visit: Payer: BC Managed Care – PPO

## 2023-01-19 MED FILL — Dexamethasone Sodium Phosphate Inj 100 MG/10ML: INTRAMUSCULAR | Qty: 1 | Status: AC

## 2023-01-21 NOTE — Assessment & Plan Note (Signed)
metastatic to liver, stage IV, KRAS G12R (+), MMR normal  -incidental finding on lung cancer screening chest CT on 01/19/22. 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  -He began first-line palliative FOLFIRINOX on 03/13/22, but unfortunately did not respond well. CA 19-9 also rose on treatment. -he switched to second line gemcitabine/abraxane on 06/05/22. He tolerates moderate well and has continued to work full time. CA 19-9 has dropped on treatment. -restaging CT from 08/24/22 showed partial response to treatment, improved primary tumor, and liver/node mets, I discussed with him -will continue current chemo   -Pt was referred to see Dr. [name] on 10/10/2022 , surgery was discussed but not felt to be a surgical candidate due to his multiple liver metastasis (5) -Restaging CT scan on November 24, 2022 showed moderate disease progression in liver and the pancreas, his tumor marker also increased.  I have changed his chemotherapy to 5-FU and liposomal irinotecan, started on 12/04/22 -He tolerated first cycle chemotherapy well, but developed significant diarrhea after cycle 2, cycle 2 chemo was postponed for a week -

## 2023-01-21 NOTE — Assessment & Plan Note (Signed)
-  Started in January 2024, secondary to oxaliplatin and Abraxane -Overall just numbness in fingers and toes, mild tingling, will continue to watch closely -He is taking B12, and use ice packs during chemoinfusion.  -overall mild and stable  

## 2023-01-22 ENCOUNTER — Inpatient Hospital Stay: Payer: BC Managed Care – PPO | Attending: Hematology | Admitting: Hematology

## 2023-01-22 ENCOUNTER — Encounter: Payer: Self-pay | Admitting: Hematology

## 2023-01-22 ENCOUNTER — Inpatient Hospital Stay: Payer: BC Managed Care – PPO

## 2023-01-22 ENCOUNTER — Ambulatory Visit: Payer: BC Managed Care – PPO

## 2023-01-22 VITALS — BP 122/75 | HR 76 | Temp 98.9°F | Ht 72.0 in | Wt 251.8 lb

## 2023-01-22 DIAGNOSIS — Z5111 Encounter for antineoplastic chemotherapy: Secondary | ICD-10-CM | POA: Insufficient documentation

## 2023-01-22 DIAGNOSIS — G620 Drug-induced polyneuropathy: Secondary | ICD-10-CM | POA: Diagnosis not present

## 2023-01-22 DIAGNOSIS — Z95828 Presence of other vascular implants and grafts: Secondary | ICD-10-CM

## 2023-01-22 DIAGNOSIS — C787 Secondary malignant neoplasm of liver and intrahepatic bile duct: Secondary | ICD-10-CM | POA: Insufficient documentation

## 2023-01-22 DIAGNOSIS — C259 Malignant neoplasm of pancreas, unspecified: Secondary | ICD-10-CM

## 2023-01-22 DIAGNOSIS — Z452 Encounter for adjustment and management of vascular access device: Secondary | ICD-10-CM | POA: Diagnosis not present

## 2023-01-22 DIAGNOSIS — S20369A Insect bite (nonvenomous) of unspecified front wall of thorax, initial encounter: Secondary | ICD-10-CM | POA: Insufficient documentation

## 2023-01-22 DIAGNOSIS — R197 Diarrhea, unspecified: Secondary | ICD-10-CM | POA: Diagnosis not present

## 2023-01-22 DIAGNOSIS — T451X5A Adverse effect of antineoplastic and immunosuppressive drugs, initial encounter: Secondary | ICD-10-CM | POA: Diagnosis not present

## 2023-01-22 DIAGNOSIS — C250 Malignant neoplasm of head of pancreas: Secondary | ICD-10-CM | POA: Diagnosis not present

## 2023-01-22 LAB — CBC WITH DIFFERENTIAL (CANCER CENTER ONLY)
Abs Immature Granulocytes: 0.02 10*3/uL (ref 0.00–0.07)
Basophils Absolute: 0.1 10*3/uL (ref 0.0–0.1)
Basophils Relative: 1 %
Eosinophils Absolute: 0.7 10*3/uL — ABNORMAL HIGH (ref 0.0–0.5)
Eosinophils Relative: 9 %
HCT: 34.8 % — ABNORMAL LOW (ref 39.0–52.0)
Hemoglobin: 11.9 g/dL — ABNORMAL LOW (ref 13.0–17.0)
Immature Granulocytes: 0 %
Lymphocytes Relative: 31 %
Lymphs Abs: 2.2 10*3/uL (ref 0.7–4.0)
MCH: 30.7 pg (ref 26.0–34.0)
MCHC: 34.2 g/dL (ref 30.0–36.0)
MCV: 89.9 fL (ref 80.0–100.0)
Monocytes Absolute: 0.5 10*3/uL (ref 0.1–1.0)
Monocytes Relative: 8 %
Neutro Abs: 3.7 10*3/uL (ref 1.7–7.7)
Neutrophils Relative %: 51 %
Platelet Count: 286 10*3/uL (ref 150–400)
RBC: 3.87 MIL/uL — ABNORMAL LOW (ref 4.22–5.81)
RDW: 15.3 % (ref 11.5–15.5)
WBC Count: 7.2 10*3/uL (ref 4.0–10.5)
nRBC: 0 % (ref 0.0–0.2)

## 2023-01-22 LAB — CMP (CANCER CENTER ONLY)
ALT: 18 U/L (ref 0–44)
AST: 18 U/L (ref 15–41)
Albumin: 3.5 g/dL (ref 3.5–5.0)
Alkaline Phosphatase: 67 U/L (ref 38–126)
Anion gap: 7 (ref 5–15)
BUN: 8 mg/dL (ref 6–20)
CO2: 26 mmol/L (ref 22–32)
Calcium: 9 mg/dL (ref 8.9–10.3)
Chloride: 106 mmol/L (ref 98–111)
Creatinine: 0.95 mg/dL (ref 0.61–1.24)
GFR, Estimated: >60 mL/min (ref >60)
Glucose, Bld: 129 mg/dL — ABNORMAL HIGH (ref 70–99)
Potassium: 3.5 mmol/L (ref 3.5–5.1)
Sodium: 139 mmol/L (ref 135–145)
Total Bilirubin: 0.5 mg/dL (ref 0.3–1.2)
Total Protein: 6.1 g/dL — ABNORMAL LOW (ref 6.5–8.1)

## 2023-01-22 MED ORDER — SODIUM CHLORIDE 0.9 % IV SOLN
2400.0000 mg/m2 | INTRAVENOUS | Status: DC
  Administered 2023-01-22: 5900 mg via INTRAVENOUS
  Filled 2023-01-22: qty 118

## 2023-01-22 MED ORDER — SODIUM CHLORIDE 0.9% FLUSH: 10.0000 mL | INTRAVENOUS | Status: DC | PRN

## 2023-01-22 MED ORDER — DEXTROSE 5 % IV SOLN: INTRAVENOUS | Status: DC

## 2023-01-22 MED ORDER — HEPARIN SOD (PORK) LOCK FLUSH 100 UNIT/ML IV SOLN: 500.0000 [IU] | Freq: Once | INTRAVENOUS | Status: DC | PRN

## 2023-01-22 MED ORDER — SODIUM CHLORIDE 0.9 % IV SOLN
400.0000 mg/m2 | Freq: Once | INTRAVENOUS | Status: AC
  Administered 2023-01-22: 980 mg via INTRAVENOUS
  Filled 2023-01-22: qty 49

## 2023-01-22 MED ORDER — ATROPINE SULFATE 1 MG/ML IV SOLN
0.5000 mg | Freq: Once | INTRAVENOUS | Status: AC | PRN
  Administered 2023-01-22: 0.5 mg via INTRAVENOUS
  Filled 2023-01-22: qty 1

## 2023-01-22 MED ORDER — SODIUM CHLORIDE 0.9% FLUSH
10.0000 mL | Freq: Once | INTRAVENOUS | Status: AC
  Administered 2023-01-22: 10 mL

## 2023-01-22 MED ORDER — SODIUM CHLORIDE 0.9 % IV SOLN
60.0000 mg/m2 | Freq: Once | INTRAVENOUS | Status: AC
  Administered 2023-01-22: 146.2 mg via INTRAVENOUS
  Filled 2023-01-22: qty 34

## 2023-01-22 MED ORDER — PALONOSETRON HCL INJECTION 0.25 MG/5ML
0.2500 mg | Freq: Once | INTRAVENOUS | Status: AC
  Administered 2023-01-22: 0.25 mg via INTRAVENOUS
  Filled 2023-01-22: qty 5

## 2023-01-22 MED ORDER — SODIUM CHLORIDE 0.9 % IV SOLN: Freq: Once | INTRAVENOUS | Status: DC

## 2023-01-22 MED ORDER — SODIUM CHLORIDE 0.9 % IV SOLN
10.0000 mg | Freq: Once | INTRAVENOUS | Status: AC
  Administered 2023-01-22: 10 mg via INTRAVENOUS
  Filled 2023-01-22: qty 10

## 2023-01-22 NOTE — Progress Notes (Signed)
[facility]   Telephone:([phone] Fax:(336) [phone]   Clinic Follow up Note   Patient Care Team: [name], MD as PCP - General (Family Medicine) [name], MD as Consulting Physician (Oncology) Tito Dine., MD as Consulting Physician (General Surgery)  Date of Service:  01/22/2023  CHIEF COMPLAINT: f/u of pancreatic cancer    CURRENT THERAPY:third line FOLFIRI with liposomal irinotecan q14 days, starting 12/04/22      ASSESSMENT:  Steve Mills is a 57 y.o. male with    Pancreatic cancer metastasized to liver [facility]) metastatic to liver, stage IV, KRAS G12R (+), MMR normal  -incidental finding on lung cancer screening chest CT on 01/19/22. 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  -He began first-line palliative FOLFIRINOX on 03/13/22, but unfortunately did not respond well. CA 19-9 also rose on treatment. -he switched to second line gemcitabine/abraxane on 06/05/22. He tolerates moderate well and has continued to work full time. CA 19-9 has dropped on treatment. -restaging CT from 08/24/22 showed partial response to treatment, improved primary tumor, and liver/node mets, I discussed with him -will continue current chemo   -Pt was referred to see Dr. [name] on 10/10/2022 , surgery was discussed but not felt to be a surgical candidate due to his multiple liver metastasis (5) -Restaging CT scan on November 24, 2022 showed moderate disease progression in liver and the pancreas, his tumor marker also increased.  I have changed his chemotherapy to 5-FU and liposomal irinotecan, started on 12/04/22 -He tolerated first cycle chemotherapy well, but developed significant diarrhea after cycle 2, cycle 2 chemo was postponed for a week -he tolerated so cycle chemo well with mild diarrhea, which is controlled with Imodium.  He did not tolerated Lomotil.  Peripheral  neuropathy due to chemotherapy (HCC) -Started in January 2024, secondary to oxaliplatin and Abraxane -Overall just numbness in fingers and toes, mild tingling, will continue to watch closely -He is taking B12, and use ice packs during chemoinfusion.  -overall mild and stable    PLAN: - reviewed labs - reviewed medications -Will proceed chemotherapy cycle 4 today - scan after next cycle - increase oral fluids - f/u appointment June 17     SUMMARY OF ONCOLOGIC HISTORY: Oncology History Overview Note   Cancer Staging  Pancreatic cancer metastasized to liver [facility]) Staging form: Exocrine Pancreas, AJCC 8th Edition - Clinical stage from 02/28/2022: Stage IV (cT2, cN1, pM1) - Signed by [name], MD on 03/02/2022 Stage prefix: Initial diagnosis     Pancreatic cancer metastasized to liver (HCC)  02/28/2022 Cancer Staging   Staging form: Exocrine Pancreas, AJCC 8th Edition - Clinical stage from 02/28/2022: Stage IV (cT2, cN1, pM1) - Signed by [name], MD on 03/02/2022 Stage prefix: Initial diagnosis   03/02/2022 Initial Diagnosis   Pancreatic cancer metastasized to liver (HCC)   03/13/2022 - 04/13/2022 Chemotherapy   Patient is on Treatment Plan : PANCREAS Modified FOLFIRINOX q14d x 4 cycles     03/13/2022 - 05/17/2022 Chemotherapy   Patient is on Treatment Plan : PANCREAS Modified FOLFIRINOX q14d x 4 cycles     03/18/2022 Genetic Testing   Negative genetic testing on the CancerNext-Expanded+RNAinsight panel.  APC [id number] VUs identified.  The report date is March 18, 2022.  The CancerNext-Expanded gene panel offered by [facility] and includes sequencing and rearrangement analysis for the following 77 genes: AIP, ALK, APC*,  ATM*, AXIN2, BAP1, BARD1, BLM, BMPR1A, BRCA1*, BRCA2*, BRIP1*, CDC73, CDH1*, CDK4, CDKN1B, CDKN2A, CHEK2*, CTNNA1, DICER1, FANCC, FH, FLCN, GALNT12, KIF1B, LZTR1, MAX, MEN1, MET, MLH1*, MSH2*, MSH3, MSH6*, MUTYH*, NBN, NF1*, NF2, NTHL1, PALB2*, PHOX2B, PMS2*, POT1,  PRKAR1A, PTCH1, PTEN*, RAD51C*, RAD51D*, RB1, RECQL, RET, SDHA, SDHAF2, SDHB, SDHC, SDHD, SMAD4, SMARCA4, SMARCB1, SMARCE1, STK11, SUFU, TMEM127, TP53*, TSC1, TSC2, VHL and XRCC2 (sequencing and deletion/duplication); EGFR, EGLN1, HOXB13, KIT, MITF, PDGFRA, POLD1, and POLE (sequencing only); EPCAM and GREM1 (deletion/duplication only). DNA and RNA analyses performed for * genes.    05/25/2022 Progression   Rising CA 19-9, CT CAP IMPRESSION: 1. Multiple new and enlarged hypodense, rim enhancing liver metastases. 2. Unchanged, ill-defined hypoenhancing mass of the pancreatic uncinate, consistent with pancreatic adenocarcinoma 3. Unchanged small prominent portacaval and gastrohepatic ligament nodes, modestly suspicious for nodal metastases. No overtly enlarged lymph nodes in the abdomen or pelvis. 4. Prostatomegaly.    06/05/2022 - 11/13/2022 Chemotherapy   Patient is on Treatment Plan : PANCREATIC Abraxane D1,8,15 + Gemcitabine D1,8,15 q28d     11/24/2022 Imaging   IMPRESSION: 1. Today's study demonstrates mild progression of disease as evidenced by slight enlargement of the mass in the uncinate process of the pancreas which demonstrates potential direct invasion into the superior wall of the third portion of the duodenum, but currently demonstrates no definite vascular involvement. Multiple hepatic lesions are generally stable to the prior study, with exception of enlargement of a lesion in segment 8, as detailed above. 2. No new sites of metastatic disease are noted elsewhere in the chest or pelvis. 3. Aortic atherosclerosis.  Rising CA 19-9    12/04/2022 -  Chemotherapy   Patient is on Treatment Plan : PANCREAS Liposomal Irinotecan + Leucovorin + 5-FU IVCI q14d        INTERVAL HISTORY:  Steve Mills is here for a follow up of pancreatic cancer. He was last seen by me on 01/01/2023. He presents to the clinic accompanied by wife.  Patient states he is doing well since his last treatment.  Has had some diarrhea but not as bad as last treatment. He is using imodium to control with no issues.        All other systems were reviewed with the patient and are negative.   MEDICAL HISTORY:  Past Medical History:  Diagnosis Date   Family history of adverse reaction to anesthesia    mother allergic to ether    Family history of breast cancer    Family history of colon cancer    Pneumonia    hx of 2014     SURGICAL HISTORY: Past Surgical History:  Procedure Laterality Date   APPENDECTOMY     CHOLECYSTECTOMY N/A 07/12/2018   Procedure: LAPAROSCOPIC CHOLECYSTECTOMY WITH INTRAOPERATIVE CHOLANGIOGRAM;  Surgeon: Luretha Murphy, MD;  Location: [facility]S;  Service: General;  Laterality: N/A;   left wrist surgery      has plate in arm    NASAL POLYP SURGERY  2017   PORTACATH PLACEMENT N/A 03/10/2022   Procedure: INSERTION PORT-A-CATH;  Surgeon: Fritzi Mandes, MD;  Location: [facility] OR;  Service: General;  Laterality: N/A;   VENTRAL HERNIA REPAIR N/A 07/15/2014   Procedure: LAPAROSCOPIC REPAIR INCARCARTED UMBILICAL  VENTRAL HERNIA, ;  Surgeon: Claud Kelp, MD;  Location: [facility];  Service: General;  Laterality: N/A;  With MESH    I have reviewed the social history and family history with the patient and they are unchanged from previous note.  ALLERGIES:  has No  Known Allergies.  MEDICATIONS:  Current Outpatient Medications  Medication Sig Dispense Refill   dexamethasone (DECADRON) 4 MG tablet Take 2 tablets (8 mg total) by mouth daily. Start the day after irinotecan chemotherapy for 2 days. Take with food. (Patient not taking: Reported on 12/18/2022) 8 tablet 5   lactobacillus acidophilus & bulgar (LACTINEX) chewable tablet CHEW 1 TABLET BY MOUTH 3 (THREE) TIMES DAILY WITH MEALS. (Patient not taking: Reported on 12/18/2022) 40 tablet 0   lidocaine-prilocaine (EMLA) cream Apply to affected area once 30 g 3   ondansetron (ZOFRAN) 8 MG tablet Take 1 tablet (8 mg total) by mouth every 8  (eight) hours as needed for nausea or vomiting. Start on the third day after irinotecan 30 tablet 1   potassium chloride SA (KLOR-CON M) 20 MEQ tablet Take 1 tablet (20 mEq total) by mouth 2 (two) times daily. Take 1 tablet by mouth 2 times a day for 5 days then take 1 tablet daily 35 tablet 0   prochlorperazine (COMPAZINE) 10 MG tablet Take 1 tablet (10 mg total) by mouth every 6 (six) hours as needed for nausea or vomiting. 30 tablet 1   No current facility-administered medications for this visit.   Facility-Administered Medications Ordered in Other Visits  Medication Dose Route Frequency Provider Last Rate Last Admin   0.9 %  sodium chloride infusion   Intravenous Once [name], MD       dextrose 5 % solution   Intravenous Continuous [name], MD   Stopped at 01/22/23 1441   fluorouracil (ADRUCIL) 5,900 mg in sodium chloride 0.9 % 132 mL chemo infusion  2,400 mg/m2 (Treatment Plan Recorded) Intravenous 1 day or 1 dose [name], MD   Infusion Verify at 01/22/23 1448   heparin lock flush 100 unit/mL  500 Units Intracatheter Once PRN [name], MD       sodium chloride flush (NS) 0.9 % injection 10 mL  10 mL Intracatheter PRN [name], MD        PHYSICAL EXAMINATION: ECOG PERFORMANCE STATUS: 1 - Symptomatic but completely ambulatory  Vitals:   01/22/23 1052  BP: 122/75  Pulse: 76  Temp: 98.9 F (37.2 C)  SpO2: 95%   Wt Readings from Last 3 Encounters:  01/22/23 251 lb 12.8 oz (114.2 kg)  01/08/23 254 lb 3.2 oz (115.3 kg)  01/01/23 249 lb (112.9 kg)     GENERAL:alert, no distress and comfortable SKIN: skin color, texture, turgor are normal, no rashes or significant lesions EYES: normal, Conjunctiva are pink and non-injected, sclera clear NECK: supple, thyroid normal size, non-tender, without nodularity LYMPH:  no palpable lymphadenopathy in the cervical, axillary  LUNGS: clear to auscultation and percussion with normal breathing effort HEART: regular rate & rhythm and no  murmurs and no lower extremity edema ABDOMEN:abdomen soft, non-tender and normal bowel sounds Musculoskeletal:no cyanosis of digits and no clubbing  NEURO: alert & oriented x 3 with fluent speech, no focal motor/sensory deficits  LABORATORY DATA:  I have reviewed the data as listed    Latest Ref Rng & Units 01/22/2023   10:17 AM 01/08/2023    7:42 AM 01/01/2023   10:54 AM  CBC  WBC 4.0 - 10.5 K/uL 7.2  8.8  6.2   Hemoglobin 13.0 - 17.0 g/dL 16.1  09.6  04.5   Hematocrit 39.0 - 52.0 % 34.8  36.7  34.7   Platelets 150 - 400 K/uL 286  334  289  Latest Ref Rng & Units 01/22/2023   10:17 AM 01/08/2023    7:42 AM 01/01/2023   10:54 AM  CMP  Glucose 70 - 99 mg/dL 161  096  045   BUN 6 - 20 mg/dL 8  8  6    Creatinine 0.61 - 1.24 mg/dL 4.09  8.11  9.14   Sodium 135 - 145 mmol/L 139  138  137   Potassium 3.5 - 5.1 mmol/L 3.5  4.4  2.8   Chloride 98 - 111 mmol/L 106  103  103   CO2 22 - 32 mmol/L 26  29  25    Calcium 8.9 - 10.3 mg/dL 9.0  8.8  8.2   Total Protein 6.5 - 8.1 g/dL 6.1  5.8  5.8   Total Bilirubin 0.3 - 1.2 mg/dL 0.5  0.3  0.5   Alkaline Phos 38 - 126 U/L 67  80  74   AST 15 - 41 U/L 18  15  16    ALT 0 - 44 U/L 18  16  28        RADIOGRAPHIC STUDIES: I have personally reviewed the radiological images as listed and agreed with the findings in the report. No results found.    No orders of the defined types were placed in this encounter.  All questions were answered. The patient knows to call the clinic with any problems, questions or concerns. No barriers to learning was detected. The total time spent in the appointment was 25 minutes.     [name], MD 01/22/2023   I, Sharlette Dense, CMA, am acting as scribe for [name], MD.   I have reviewed the above documentation for accuracy and completeness, and I agree with the above.

## 2023-01-22 NOTE — Patient Instructions (Signed)
[place] [facility]  Discharge Instructions: Thank you for choosing [facility] to provide your oncology and hematology care.   If you have a lab appointment with the Cancer Center, please go directly to the Cancer Center and check in at the registration area.   Wear comfortable clothing and clothing appropriate for easy access to any Portacath or PICC line.   We strive to give you quality time with your provider. You may need to reschedule your appointment if you arrive late (15 or more minutes).  Arriving late affects you and other patients whose appointments are after yours.  Also, if you miss three or more appointments without notifying the office, you may be dismissed from the clinic at the provider's discretion.      For prescription refill requests, have your pharmacy contact our office and allow 72 hours for refills to be completed.    Today you received the following chemotherapy and/or immunotherapy agents: Onivyde, Leucovorin, Fluorouracil.       To help prevent nausea and vomiting after your treatment, we encourage you to take your nausea medication as directed.  BELOW ARE SYMPTOMS THAT SHOULD BE REPORTED IMMEDIATELY: *FEVER GREATER THAN 100.4 F (38 C) OR HIGHER *CHILLS OR SWEATING *NAUSEA AND VOMITING THAT IS NOT CONTROLLED WITH YOUR NAUSEA MEDICATION *UNUSUAL SHORTNESS OF BREATH *UNUSUAL BRUISING OR BLEEDING *URINARY PROBLEMS (pain or burning when urinating, or frequent urination) *BOWEL PROBLEMS (unusual diarrhea, constipation, pain near the anus) TENDERNESS IN MOUTH AND THROAT WITH OR WITHOUT PRESENCE OF ULCERS (sore throat, sores in mouth, or a toothache) UNUSUAL RASH, SWELLING OR PAIN  UNUSUAL VAGINAL DISCHARGE OR ITCHING   Items with * indicate a potential emergency and should be followed up as soon as possible or go to the Emergency Department if any problems should occur.  Please show the CHEMOTHERAPY ALERT CARD or  IMMUNOTHERAPY ALERT CARD at check-in to the Emergency Department and triage nurse.  Should you have questions after your visit or need to cancel or reschedule your appointment, please contact  CANC[facility]ENTER AT [facility]  Dept: [phone]  and follow the prompts.  Office hours are 8:00 a.m. to 4:30 p.m. Monday - Friday. Please note that voicemails left after 4:00 p.m. may not be returned until the following business day.  We are closed weekends and major holidays. You have access to a nurse at all times for urgent questions. Please call the main number to the clinic Dept: [phone] and follow the prompts.   For any non-urgent questions, you may also contact your provider using MyChart. We now offer e-Visits for anyone 42 and older to request care online for non-urgent symptoms. For details visit [email or web address].   Also download the MyChart app! Go to the app store, search "MyChart", open the app, select , and log in with your MyChart username and password.

## 2023-01-23 ENCOUNTER — Ambulatory Visit: Payer: BC Managed Care – PPO | Admitting: Nurse Practitioner

## 2023-01-23 LAB — CANCER ANTIGEN 19-9: CA 19-9: 34 U/mL (ref 0–35)

## 2023-01-24 ENCOUNTER — Other Ambulatory Visit: Payer: Self-pay

## 2023-01-24 ENCOUNTER — Inpatient Hospital Stay: Payer: BC Managed Care – PPO

## 2023-01-24 VITALS — BP 122/82 | HR 62 | Temp 98.6°F | Resp 16

## 2023-01-24 DIAGNOSIS — Z452 Encounter for adjustment and management of vascular access device: Secondary | ICD-10-CM | POA: Diagnosis not present

## 2023-01-24 DIAGNOSIS — C250 Malignant neoplasm of head of pancreas: Secondary | ICD-10-CM | POA: Diagnosis not present

## 2023-01-24 DIAGNOSIS — Z5111 Encounter for antineoplastic chemotherapy: Secondary | ICD-10-CM | POA: Diagnosis not present

## 2023-01-24 DIAGNOSIS — S20369A Insect bite (nonvenomous) of unspecified front wall of thorax, initial encounter: Secondary | ICD-10-CM | POA: Diagnosis not present

## 2023-01-24 DIAGNOSIS — C787 Secondary malignant neoplasm of liver and intrahepatic bile duct: Secondary | ICD-10-CM | POA: Diagnosis not present

## 2023-01-24 DIAGNOSIS — G620 Drug-induced polyneuropathy: Secondary | ICD-10-CM | POA: Diagnosis not present

## 2023-01-24 DIAGNOSIS — R197 Diarrhea, unspecified: Secondary | ICD-10-CM | POA: Diagnosis not present

## 2023-01-24 DIAGNOSIS — C259 Malignant neoplasm of pancreas, unspecified: Secondary | ICD-10-CM

## 2023-01-24 MED ORDER — HEPARIN SOD (PORK) LOCK FLUSH 100 UNIT/ML IV SOLN
500.0000 [IU] | Freq: Once | INTRAVENOUS | Status: AC | PRN
  Administered 2023-01-24: 500 [IU]

## 2023-01-24 MED ORDER — SODIUM CHLORIDE 0.9% FLUSH
10.0000 mL | INTRAVENOUS | Status: DC | PRN
  Administered 2023-01-24: 10 mL

## 2023-01-29 ENCOUNTER — Other Ambulatory Visit: Payer: Self-pay | Admitting: Hematology

## 2023-02-02 MED FILL — Dexamethasone Sodium Phosphate Inj 100 MG/10ML: INTRAMUSCULAR | Qty: 1 | Status: AC

## 2023-02-03 DIAGNOSIS — C787 Secondary malignant neoplasm of liver and intrahepatic bile duct: Secondary | ICD-10-CM | POA: Diagnosis not present

## 2023-02-03 DIAGNOSIS — C259 Malignant neoplasm of pancreas, unspecified: Secondary | ICD-10-CM | POA: Diagnosis not present

## 2023-02-04 NOTE — Assessment & Plan Note (Signed)
-  Started in January 2024, secondary to oxaliplatin and Abraxane -Overall just numbness in fingers and toes, mild tingling, will continue to watch closely -He is taking B12, and use ice packs during chemoinfusion.  -overall mild and stable  

## 2023-02-04 NOTE — Assessment & Plan Note (Signed)
metastatic to liver, stage IV, KRAS G12R (+), MMR normal  -incidental finding on lung cancer screening chest CT on 01/19/22. 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  -He began first-line palliative FOLFIRINOX on 03/13/22, but unfortunately did not respond well. CA 19-9 also rose on treatment. -he switched to second line gemcitabine/abraxane on 06/05/22. He tolerates moderate well and has continued to work full time. CA 19-9 has dropped on treatment. -restaging CT from 08/24/22 showed partial response to treatment, improved primary tumor, and liver/node mets, I discussed with him -will continue current chemo   -Pt was referred to see Dr. [name] on 10/10/2022 , surgery was discussed but not felt to be a surgical candidate due to his multiple liver metastasis (5) -Restaging CT scan on November 24, 2022 showed moderate disease progression in liver and the pancreas, his tumor marker also increased.  I have changed his chemotherapy to 5-FU and liposomal irinotecan, started on 12/04/22 -He tolerated first cycle chemotherapy well, but developed significant diarrhea after cycle 2, cycle 2 chemo was postponed for a week -he tolerated cycle 3 and 4 chemo well with mild diarrhea, which is controlled with Imodium.  He did not tolerated Lomotil. -He is not to 2 tumor marker were normal, which is a good sign. -Plan to repeat CT scan after cycle 6

## 2023-02-05 ENCOUNTER — Inpatient Hospital Stay: Payer: BC Managed Care – PPO

## 2023-02-05 ENCOUNTER — Inpatient Hospital Stay: Payer: BC Managed Care – PPO | Admitting: Hematology

## 2023-02-05 ENCOUNTER — Encounter: Payer: Self-pay | Admitting: Hematology

## 2023-02-05 ENCOUNTER — Other Ambulatory Visit: Payer: Self-pay

## 2023-02-05 VITALS — BP 119/69 | HR 74 | Temp 98.2°F | Wt 250.9 lb

## 2023-02-05 DIAGNOSIS — C259 Malignant neoplasm of pancreas, unspecified: Secondary | ICD-10-CM

## 2023-02-05 DIAGNOSIS — C250 Malignant neoplasm of head of pancreas: Secondary | ICD-10-CM | POA: Diagnosis not present

## 2023-02-05 DIAGNOSIS — R197 Diarrhea, unspecified: Secondary | ICD-10-CM | POA: Diagnosis not present

## 2023-02-05 DIAGNOSIS — C787 Secondary malignant neoplasm of liver and intrahepatic bile duct: Secondary | ICD-10-CM | POA: Diagnosis not present

## 2023-02-05 DIAGNOSIS — Z95828 Presence of other vascular implants and grafts: Secondary | ICD-10-CM

## 2023-02-05 DIAGNOSIS — S20369A Insect bite (nonvenomous) of unspecified front wall of thorax, initial encounter: Secondary | ICD-10-CM | POA: Diagnosis not present

## 2023-02-05 DIAGNOSIS — G620 Drug-induced polyneuropathy: Secondary | ICD-10-CM | POA: Diagnosis not present

## 2023-02-05 DIAGNOSIS — T451X5A Adverse effect of antineoplastic and immunosuppressive drugs, initial encounter: Secondary | ICD-10-CM | POA: Diagnosis not present

## 2023-02-05 DIAGNOSIS — Z5111 Encounter for antineoplastic chemotherapy: Secondary | ICD-10-CM | POA: Diagnosis not present

## 2023-02-05 DIAGNOSIS — Z452 Encounter for adjustment and management of vascular access device: Secondary | ICD-10-CM | POA: Diagnosis not present

## 2023-02-05 LAB — CMP (CANCER CENTER ONLY)
ALT: 17 U/L (ref 0–44)
AST: 15 U/L (ref 15–41)
Albumin: 3.4 g/dL — ABNORMAL LOW (ref 3.5–5.0)
Alkaline Phosphatase: 74 U/L (ref 38–126)
Anion gap: 7 (ref 5–15)
BUN: 8 mg/dL (ref 6–20)
CO2: 28 mmol/L (ref 22–32)
Calcium: 8.7 mg/dL — ABNORMAL LOW (ref 8.9–10.3)
Chloride: 105 mmol/L (ref 98–111)
Creatinine: 0.69 mg/dL (ref 0.61–1.24)
GFR, Estimated: >60 mL/min (ref >60)
Glucose, Bld: 117 mg/dL — ABNORMAL HIGH (ref 70–99)
Potassium: 3.2 mmol/L — ABNORMAL LOW (ref 3.5–5.1)
Sodium: 140 mmol/L (ref 135–145)
Total Bilirubin: 0.5 mg/dL (ref 0.3–1.2)
Total Protein: 5.6 g/dL — ABNORMAL LOW (ref 6.5–8.1)

## 2023-02-05 LAB — CBC WITH DIFFERENTIAL (CANCER CENTER ONLY)
Abs Immature Granulocytes: 0.03 10*3/uL (ref 0.00–0.07)
Basophils Absolute: 0.1 10*3/uL (ref 0.0–0.1)
Basophils Relative: 1 %
Eosinophils Absolute: 0.5 10*3/uL (ref 0.0–0.5)
Eosinophils Relative: 8 %
HCT: 34.7 % — ABNORMAL LOW (ref 39.0–52.0)
Hemoglobin: 11.6 g/dL — ABNORMAL LOW (ref 13.0–17.0)
Immature Granulocytes: 0 %
Lymphocytes Relative: 30 %
Lymphs Abs: 2 10*3/uL (ref 0.7–4.0)
MCH: 30.4 pg (ref 26.0–34.0)
MCHC: 33.4 g/dL (ref 30.0–36.0)
MCV: 91.1 fL (ref 80.0–100.0)
Monocytes Absolute: 0.6 10*3/uL (ref 0.1–1.0)
Monocytes Relative: 8 %
Neutro Abs: 3.6 10*3/uL (ref 1.7–7.7)
Neutrophils Relative %: 53 %
Platelet Count: 262 10*3/uL (ref 150–400)
RBC: 3.81 MIL/uL — ABNORMAL LOW (ref 4.22–5.81)
RDW: 15.3 % (ref 11.5–15.5)
WBC Count: 6.9 10*3/uL (ref 4.0–10.5)
nRBC: 0 % (ref 0.0–0.2)

## 2023-02-05 MED ORDER — SODIUM CHLORIDE 0.9% FLUSH
10.0000 mL | Freq: Once | INTRAVENOUS | Status: AC
  Administered 2023-02-05: 10 mL

## 2023-02-05 MED ORDER — SODIUM CHLORIDE 0.9 % IV SOLN
2400.0000 mg/m2 | INTRAVENOUS | Status: DC
  Administered 2023-02-05: 5900 mg via INTRAVENOUS
  Filled 2023-02-05: qty 118

## 2023-02-05 MED ORDER — SODIUM CHLORIDE 0.9 % IV SOLN: Freq: Once | INTRAVENOUS | Status: AC

## 2023-02-05 MED ORDER — ATROPINE SULFATE 1 MG/ML IV SOLN
0.5000 mg | Freq: Once | INTRAVENOUS | Status: AC | PRN
  Administered 2023-02-05: 0.5 mg via INTRAVENOUS
  Filled 2023-02-05: qty 1

## 2023-02-05 MED ORDER — SODIUM CHLORIDE 0.9 % IV SOLN
60.0000 mg/m2 | Freq: Once | INTRAVENOUS | Status: AC
  Administered 2023-02-05: 146.2 mg via INTRAVENOUS
  Filled 2023-02-05: qty 34

## 2023-02-05 MED ORDER — SODIUM CHLORIDE 0.9 % IV SOLN
10.0000 mg | Freq: Once | INTRAVENOUS | Status: AC
  Administered 2023-02-05: 10 mg via INTRAVENOUS
  Filled 2023-02-05: qty 10

## 2023-02-05 MED ORDER — DOXYCYCLINE HYCLATE 100 MG PO TABS: 100.0000 mg | ORAL_TABLET | Freq: Two times a day (BID) | ORAL | 0 refills | Status: DC

## 2023-02-05 MED ORDER — PALONOSETRON HCL INJECTION 0.25 MG/5ML
0.2500 mg | Freq: Once | INTRAVENOUS | Status: AC
  Administered 2023-02-05: 0.25 mg via INTRAVENOUS
  Filled 2023-02-05: qty 5

## 2023-02-05 MED ORDER — SODIUM CHLORIDE 0.9 % IV SOLN
400.0000 mg/m2 | Freq: Once | INTRAVENOUS | Status: AC
  Administered 2023-02-05: 980 mg via INTRAVENOUS
  Filled 2023-02-05: qty 49

## 2023-02-05 NOTE — Patient Instructions (Signed)
[place] [facility]  Discharge Instructions: Thank you for choosing [facility] to provide your oncology and hematology care.   If you have a lab appointment with the Cancer Center, please go directly to the Cancer Center and check in at the registration area.   Wear comfortable clothing and clothing appropriate for easy access to any Portacath or PICC line.   We strive to give you quality time with your provider. You may need to reschedule your appointment if you arrive late (15 or more minutes).  Arriving late affects you and other patients whose appointments are after yours.  Also, if you miss three or more appointments without notifying the office, you may be dismissed from the clinic at the provider's discretion.      For prescription refill requests, have your pharmacy contact our office and allow 72 hours for refills to be completed.    Today you received the following chemotherapy and/or immunotherapy agents: Irinotecan Liposomal, Leucovorin, Fluorouracil       To help prevent nausea and vomiting after your treatment, we encourage you to take your nausea medication as directed.  BELOW ARE SYMPTOMS THAT SHOULD BE REPORTED IMMEDIATELY: *FEVER GREATER THAN 100.4 F (38 C) OR HIGHER *CHILLS OR SWEATING *NAUSEA AND VOMITING THAT IS NOT CONTROLLED WITH YOUR NAUSEA MEDICATION *UNUSUAL SHORTNESS OF BREATH *UNUSUAL BRUISING OR BLEEDING *URINARY PROBLEMS (pain or burning when urinating, or frequent urination) *BOWEL PROBLEMS (unusual diarrhea, constipation, pain near the anus) TENDERNESS IN MOUTH AND THROAT WITH OR WITHOUT PRESENCE OF ULCERS (sore throat, sores in mouth, or a toothache) UNUSUAL RASH, SWELLING OR PAIN  UNUSUAL VAGINAL DISCHARGE OR ITCHING   Items with * indicate a potential emergency and should be followed up as soon as possible or go to the Emergency Department if any problems should occur.  Please show the CHEMOTHERAPY ALERT  CARD or IMMUNOTHERAPY ALERT CARD at check-in to the Emergency Department and triage nurse.  Should you have questions after your visit or need to cancel or reschedule your appointment, please contact [place] [facility]  Dept: [phone]  and follow the prompts.  Office hours are 8:00 a.m. to 4:30 p.m. Monday - Friday. Please note that voicemails left after 4:00 p.m. may not be returned until the following business day.  We are closed weekends and major holidays. You have access to a nurse at all times for urgent questions. Please call the main number to the clinic Dept: [phone] and follow the prompts.   For any non-urgent questions, you may also contact your provider using MyChart. We now offer e-Visits for anyone 69 and older to request care online for non-urgent symptoms. For details visit [email or web address].   Also download the MyChart app! Go to the app store, search "MyChart", open the app, select [place], and log in with your MyChart username and password.

## 2023-02-05 NOTE — Progress Notes (Signed)
[facility]   Telephone:([phone] Fax:([phone]   Clinic Follow up Note   Patient Care Team: [name], MD as PCP - General (Family Medicine) [name], MD as Consulting Physician (Oncology) Tito Dine., MD as Consulting Physician (General Surgery)  Date of Service:  02/05/2023  CHIEF COMPLAINT: f/u of pancreatic cancer   CURRENT THERAPY: third line FOLFIRI with liposomal irinotecan q14 days, starting 12/04/22     ASSESSMENT:  Steve Mills is a 57 y.o. male with   Pancreatic cancer metastasized to liver [facility]) metastatic to liver, stage IV, KRAS G12R (+), MMR normal  -incidental finding on lung cancer screening chest CT on 01/19/22. 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  -He began first-line palliative FOLFIRINOX on 03/13/22, but unfortunately did not respond well. CA 19-9 also rose on treatment. -he switched to second line gemcitabine/abraxane on 06/05/22. He tolerates moderate well and has continued to work full time. CA 19-9 has dropped on treatment. -restaging CT from 08/24/22 showed partial response to treatment, improved primary tumor, and liver/node mets, I discussed with him -will continue current chemo   -Pt was referred to see Dr. [name] on 10/10/2022 , surgery was discussed but not felt to be a surgical candidate due to his multiple liver metastasis (5) -Restaging CT scan on November 24, 2022 showed moderate disease progression in liver and the pancreas, his tumor marker also increased.  I have changed his chemotherapy to 5-FU and liposomal irinotecan, started on 12/04/22 -He tolerated first cycle chemotherapy well, but developed significant diarrhea after cycle 2, cycle 2 chemo was postponed for a week -he tolerated cycle 3 and 4 chemo well with mild diarrhea, which is controlled with Imodium.  He did not tolerated Lomotil. -He is  not to 2 tumor marker were normal, which is a good sign. -Plan to repeat CT scan after cycle 6  Peripheral neuropathy due to chemotherapy (HCC) -Started in January 2024, secondary to oxaliplatin and Abraxane -Overall just numbness in fingers and toes, mild tingling, will continue to watch closely -He is taking B12, and use ice packs during chemoinfusion.  -overall mild and stable      Tick bit -he had tick bite 2 days ago in front chest, it was a [employer] tick, he has developed significant skin erythema in the tick bite site -I recommend doxycycline 100 mg twice daily for 10 days, to treat tick bite related infections, especially Lyme disease, he agrees.   PLAN: Lab reviewed -I order CT CAP 7/15 -proceed with Adrucil/56fu/  today -lab/flush and treatment 7/3         SUMMARY OF ONCOLOGIC HISTORY: Oncology History Overview Note   Cancer Staging  Pancreatic cancer metastasized to liver [facility]) Staging form: Exocrine Pancreas, AJCC 8th Edition - Clinical stage from 02/28/2022: Stage IV (cT2, cN1, pM1) - Signed by [name], MD on 03/02/2022 Stage prefix: Initial diagnosis     Pancreatic cancer metastasized to liver (HCC)  02/28/2022 Cancer Staging   Staging form: Exocrine Pancreas, AJCC 8th Edition - Clinical stage from 02/28/2022: Stage IV (cT2, cN1, pM1) - Signed by [name], MD on 03/02/2022 Stage prefix: Initial diagnosis   03/02/2022 Initial Diagnosis   Pancreatic cancer metastasized to liver (HCC)   03/13/2022 - 04/13/2022 Chemotherapy   Patient is on Treatment Plan : PANCREAS Modified FOLFIRINOX q14d x 4 cycles     03/13/2022 -  05/17/2022 Chemotherapy   Patient is on Treatment Plan : PANCREAS Modified FOLFIRINOX q14d x 4 cycles     03/18/2022 Genetic Testing   Negative genetic testing on the CancerNext-Expanded+RNAinsight panel.  APC [id number] VUs identified.  The report date is March 18, 2022.  The CancerNext-Expanded gene panel offered by [facility] and includes  sequencing and rearrangement analysis for the following 77 genes: AIP, ALK, APC*, ATM*, AXIN2, BAP1, BARD1, BLM, BMPR1A, BRCA1*, BRCA2*, BRIP1*, CDC73, CDH1*, CDK4, CDKN1B, CDKN2A, CHEK2*, CTNNA1, DICER1, FANCC, FH, FLCN, GALNT12, KIF1B, LZTR1, MAX, MEN1, MET, MLH1*, MSH2*, MSH3, MSH6*, MUTYH*, NBN, NF1*, NF2, NTHL1, PALB2*, PHOX2B, PMS2*, POT1, PRKAR1A, PTCH1, PTEN*, RAD51C*, RAD51D*, RB1, RECQL, RET, SDHA, SDHAF2, SDHB, SDHC, SDHD, SMAD4, SMARCA4, SMARCB1, SMARCE1, STK11, SUFU, TMEM127, TP53*, TSC1, TSC2, VHL and XRCC2 (sequencing and deletion/duplication); EGFR, EGLN1, HOXB13, KIT, MITF, PDGFRA, POLD1, and POLE (sequencing only); EPCAM and GREM1 (deletion/duplication only). DNA and RNA analyses performed for * genes.    05/25/2022 Progression   Rising CA 19-9, CT CAP IMPRESSION: 1. Multiple new and enlarged hypodense, rim enhancing liver metastases. 2. Unchanged, ill-defined hypoenhancing mass of the pancreatic uncinate, consistent with pancreatic adenocarcinoma 3. Unchanged small prominent portacaval and gastrohepatic ligament nodes, modestly suspicious for nodal metastases. No overtly enlarged lymph nodes in the abdomen or pelvis. 4. Prostatomegaly.    06/05/2022 - 11/13/2022 Chemotherapy   Patient is on Treatment Plan : PANCREATIC Abraxane D1,8,15 + Gemcitabine D1,8,15 q28d     11/24/2022 Imaging   IMPRESSION: 1. Today's study demonstrates mild progression of disease as evidenced by slight enlargement of the mass in the uncinate process of the pancreas which demonstrates potential direct invasion into the superior wall of the third portion of the duodenum, but currently demonstrates no definite vascular involvement. Multiple hepatic lesions are generally stable to the prior study, with exception of enlargement of a lesion in segment 8, as detailed above. 2. No new sites of metastatic disease are noted elsewhere in the chest or pelvis. 3. Aortic atherosclerosis.  Rising CA 19-9    12/04/2022 -   Chemotherapy   Patient is on Treatment Plan : PANCREAS Liposomal Irinotecan + Leucovorin + 5-FU IVCI q14d        INTERVAL HISTORY:  Steve Mills is here for a follow up of pancreatic cancer. He was last seen by me on 01/22/2023. He presents to the clinic accompanied by wife. Pt  report of having a tick bite in his chest wall two days ago Pt state that  the type of tick was a [employer] tick.. Pt state that he has a lump on the back of his left shoulder. Pt state that  he had little dirrhea from last cycle if treatment. Pt state that his neuropathy is about the same.     All other systems were reviewed with the patient and are negative.  MEDICAL HISTORY:  Past Medical History:  Diagnosis Date   Family history of adverse reaction to anesthesia    mother allergic to ether    Family history of breast cancer    Family history of colon cancer    Pneumonia    hx of 2014     SURGICAL HISTORY: Past Surgical History:  Procedure Laterality Date   APPENDECTOMY     CHOLECYSTECTOMY N/A 07/12/2018   Procedure: LAPAROSCOPIC CHOLECYSTECTOMY WITH INTRAOPERATIVE CHOLANGIOGRAM;  Surgeon: Luretha Murphy, MD;  Location: [facility];  Service: General;  Laterality: N/A;   left wrist surgery      has plate in arm  NASAL POLYP SURGERY  2017   PORTACATH PLACEMENT N/A 03/10/2022   Procedure: INSERTION PORT-A-CATH;  Surgeon: Fritzi Mandes, MD;  Location: [facility] OR;  Service: General;  Laterality: N/A;   VENTRAL HERNIA REPAIR N/A 07/15/2014   Procedure: LAPAROSCOPIC REPAIR INCARCARTED UMBILICAL  VENTRAL HERNIA, ;  Surgeon: Claud Kelp, MD;  Location: [facility];  Service: General;  Laterality: N/A;  With MESH    I have reviewed the social history and family history with the patient and they are unchanged from previous note.  ALLERGIES:  has No Known Allergies.  MEDICATIONS:  Current Outpatient Medications  Medication Sig Dispense Refill   doxycycline (VIBRA-TABS) 100 MG tablet Take 1 tablet (100 mg  total) by mouth 2 (two) times daily. 20 tablet 0   dexamethasone (DECADRON) 4 MG tablet Take 2 tablets (8 mg total) by mouth daily. Start the day after irinotecan chemotherapy for 2 days. Take with food. (Patient not taking: Reported on 12/18/2022) 8 tablet 5   KLOR-CON M20 20 MEQ tablet TAKE 1 TABLET BY MOUTH 2 TIMES A DAY FOR 5 DAYS THEN TAKE 1 TABLET DAILY 35 tablet 0   lactobacillus acidophilus & bulgar (LACTINEX) chewable tablet CHEW 1 TABLET BY MOUTH 3 (THREE) TIMES DAILY WITH MEALS. (Patient not taking: Reported on 12/18/2022) 40 tablet 0   lidocaine-prilocaine (EMLA) cream Apply to affected area once 30 g 3   ondansetron (ZOFRAN) 8 MG tablet Take 1 tablet (8 mg total) by mouth every 8 (eight) hours as needed for nausea or vomiting. Start on the third day after irinotecan 30 tablet 1   prochlorperazine (COMPAZINE) 10 MG tablet Take 1 tablet (10 mg total) by mouth every 6 (six) hours as needed for nausea or vomiting. 30 tablet 1   No current facility-administered medications for this visit.   Facility-Administered Medications Ordered in Other Visits  Medication Dose Route Frequency Provider Last Rate Last Admin   fluorouracil (ADRUCIL) 5,900 mg in sodium chloride 0.9 % 132 mL chemo infusion  2,400 mg/m2 (Treatment Plan Recorded) Intravenous 1 day or 1 dose [name], MD   Infusion Verify at 02/05/23 1522    PHYSICAL EXAMINATION: ECOG PERFORMANCE STATUS:   Vitals:   02/05/23 1133  BP: 119/69  Pulse: 74  Temp: 98.2 F (36.8 C)  SpO2: 96%   Wt Readings from Last 3 Encounters:  02/05/23 250 lb 14.4 oz (113.8 kg)  01/22/23 251 lb 12.8 oz (114.2 kg)  01/08/23 254 lb 3.2 oz (115.3 kg)     GENERAL:alert, no distress and comfortable SKIN: skin color normal, no rashes or significant lesions EYES: normal, Conjunctiva are pink and non-injected, sclera clear  NEURO: alert & oriented x 3 with fluent speech   LABORATORY DATA:  I have reviewed the data as listed    Latest Ref Rng & Units  02/05/2023   10:59 AM 01/22/2023   10:17 AM 01/08/2023    7:42 AM  CBC  WBC 4.0 - 10.5 K/uL 6.9  7.2  8.8   Hemoglobin 13.0 - 17.0 g/dL 09.8  11.9  14.7   Hematocrit 39.0 - 52.0 % 34.7  34.8  36.7   Platelets 150 - 400 K/uL 262  286  334         Latest Ref Rng & Units 02/05/2023   10:59 AM 01/22/2023   10:17 AM 01/08/2023    7:42 AM  CMP  Glucose 70 - 99 mg/dL 829  562  130   BUN 6 - 20 mg/dL 8  8  8   Creatinine 0.61 - 1.24 mg/dL 1.61  0.96  0.45   Sodium 135 - 145 mmol/L 140  139  138   Potassium 3.5 - 5.1 mmol/L 3.2  3.5  4.4   Chloride 98 - 111 mmol/L 105  106  103   CO2 22 - 32 mmol/L 28  26  29    Calcium 8.9 - 10.3 mg/dL 8.7  9.0  8.8   Total Protein 6.5 - 8.1 g/dL 5.6  6.1  5.8   Total Bilirubin 0.3 - 1.2 mg/dL 0.5  0.5  0.3   Alkaline Phos 38 - 126 U/L 74  67  80   AST 15 - 41 U/L 15  18  15    ALT 0 - 44 U/L 17  18  16        RADIOGRAPHIC STUDIES: I have personally reviewed the radiological images as listed and agreed with the findings in the report. No results found.    Orders Placed This Encounter  Procedures   CT CHEST ABDOMEN PELVIS W CONTRAST    Standing Status:   Future    Standing Expiration Date:   02/05/2024    Order Specific Question:   If indicated for the ordered procedure, I authorize the administration of contrast media per Radiology protocol    Answer:   Yes    Order Specific Question:   Does the patient have a contrast media/X-ray dye allergy?    Answer:   No    Order Specific Question:   Preferred imaging location?    Answer:   [facility]    Order Specific Question:   Release to patient    Answer:   Immediate    Order Specific Question:   If indicated for the ordered procedure, I authorize the administration of oral contrast media per Radiology protocol    Answer:   Yes   All questions were answered. The patient knows to call the clinic with any problems, questions or concerns. No barriers to learning was detected. The total time spent  in the appointment was 25 minutes.     [name], MD 02/05/2023   Carolin Coy, CMA, am acting as scribe for [name], MD.   I have reviewed the above documentation for accuracy and completeness, and I agree with the above.

## 2023-02-07 ENCOUNTER — Inpatient Hospital Stay: Payer: BC Managed Care – PPO

## 2023-02-07 ENCOUNTER — Other Ambulatory Visit: Payer: Self-pay

## 2023-02-07 VITALS — BP 118/81 | HR 68 | Temp 98.6°F | Resp 20

## 2023-02-07 DIAGNOSIS — C787 Secondary malignant neoplasm of liver and intrahepatic bile duct: Secondary | ICD-10-CM | POA: Diagnosis not present

## 2023-02-07 DIAGNOSIS — G620 Drug-induced polyneuropathy: Secondary | ICD-10-CM | POA: Diagnosis not present

## 2023-02-07 DIAGNOSIS — S20369A Insect bite (nonvenomous) of unspecified front wall of thorax, initial encounter: Secondary | ICD-10-CM | POA: Diagnosis not present

## 2023-02-07 DIAGNOSIS — R197 Diarrhea, unspecified: Secondary | ICD-10-CM | POA: Diagnosis not present

## 2023-02-07 DIAGNOSIS — C250 Malignant neoplasm of head of pancreas: Secondary | ICD-10-CM | POA: Diagnosis not present

## 2023-02-07 DIAGNOSIS — Z5111 Encounter for antineoplastic chemotherapy: Secondary | ICD-10-CM | POA: Diagnosis not present

## 2023-02-07 DIAGNOSIS — Z452 Encounter for adjustment and management of vascular access device: Secondary | ICD-10-CM | POA: Diagnosis not present

## 2023-02-07 LAB — CANCER ANTIGEN 19-9: CA 19-9: 32 U/mL (ref 0–35)

## 2023-02-07 MED ORDER — HEPARIN SOD (PORK) LOCK FLUSH 100 UNIT/ML IV SOLN
500.0000 [IU] | Freq: Once | INTRAVENOUS | Status: AC | PRN
  Administered 2023-02-07: 500 [IU]

## 2023-02-07 MED ORDER — SODIUM CHLORIDE 0.9% FLUSH
10.0000 mL | INTRAVENOUS | Status: DC | PRN
  Administered 2023-02-07: 10 mL

## 2023-02-19 ENCOUNTER — Ambulatory Visit: Payer: BC Managed Care – PPO

## 2023-02-19 ENCOUNTER — Ambulatory Visit: Payer: BC Managed Care – PPO | Admitting: Physician Assistant

## 2023-02-19 ENCOUNTER — Other Ambulatory Visit: Payer: BC Managed Care – PPO

## 2023-02-20 MED FILL — Dexamethasone Sodium Phosphate Inj 100 MG/10ML: INTRAMUSCULAR | Qty: 1 | Status: AC

## 2023-02-21 ENCOUNTER — Other Ambulatory Visit: Payer: Self-pay

## 2023-02-21 ENCOUNTER — Inpatient Hospital Stay: Payer: BC Managed Care – PPO | Admitting: Adult Health

## 2023-02-21 ENCOUNTER — Inpatient Hospital Stay: Payer: BC Managed Care – PPO

## 2023-02-21 ENCOUNTER — Encounter: Payer: Self-pay | Admitting: Adult Health

## 2023-02-21 ENCOUNTER — Inpatient Hospital Stay: Payer: BC Managed Care – PPO | Attending: Hematology

## 2023-02-21 VITALS — BP 134/94 | HR 73 | Temp 97.9°F | Resp 18 | Ht 72.0 in | Wt 246.6 lb

## 2023-02-21 DIAGNOSIS — Z5111 Encounter for antineoplastic chemotherapy: Secondary | ICD-10-CM | POA: Diagnosis not present

## 2023-02-21 DIAGNOSIS — Z452 Encounter for adjustment and management of vascular access device: Secondary | ICD-10-CM | POA: Insufficient documentation

## 2023-02-21 DIAGNOSIS — C250 Malignant neoplasm of head of pancreas: Secondary | ICD-10-CM | POA: Insufficient documentation

## 2023-02-21 DIAGNOSIS — G629 Polyneuropathy, unspecified: Secondary | ICD-10-CM | POA: Insufficient documentation

## 2023-02-21 DIAGNOSIS — B001 Herpesviral vesicular dermatitis: Secondary | ICD-10-CM | POA: Insufficient documentation

## 2023-02-21 DIAGNOSIS — C259 Malignant neoplasm of pancreas, unspecified: Secondary | ICD-10-CM | POA: Diagnosis not present

## 2023-02-21 DIAGNOSIS — C787 Secondary malignant neoplasm of liver and intrahepatic bile duct: Secondary | ICD-10-CM | POA: Diagnosis not present

## 2023-02-21 DIAGNOSIS — R197 Diarrhea, unspecified: Secondary | ICD-10-CM | POA: Diagnosis not present

## 2023-02-21 DIAGNOSIS — Z95828 Presence of other vascular implants and grafts: Secondary | ICD-10-CM

## 2023-02-21 LAB — CBC WITH DIFFERENTIAL (CANCER CENTER ONLY)
Abs Immature Granulocytes: 0.02 10*3/uL (ref 0.00–0.07)
Basophils Absolute: 0.1 10*3/uL (ref 0.0–0.1)
Basophils Relative: 1 %
Eosinophils Absolute: 0.6 10*3/uL — ABNORMAL HIGH (ref 0.0–0.5)
Eosinophils Relative: 10 %
HCT: 35.8 % — ABNORMAL LOW (ref 39.0–52.0)
Hemoglobin: 12.2 g/dL — ABNORMAL LOW (ref 13.0–17.0)
Immature Granulocytes: 0 %
Lymphocytes Relative: 35 %
Lymphs Abs: 2 10*3/uL (ref 0.7–4.0)
MCH: 30.7 pg (ref 26.0–34.0)
MCHC: 34.1 g/dL (ref 30.0–36.0)
MCV: 90.2 fL (ref 80.0–100.0)
Monocytes Absolute: 0.6 10*3/uL (ref 0.1–1.0)
Monocytes Relative: 11 %
Neutro Abs: 2.5 10*3/uL (ref 1.7–7.7)
Neutrophils Relative %: 43 %
Platelet Count: 290 10*3/uL (ref 150–400)
RBC: 3.97 MIL/uL — ABNORMAL LOW (ref 4.22–5.81)
RDW: 15.3 % (ref 11.5–15.5)
WBC Count: 5.8 10*3/uL (ref 4.0–10.5)
nRBC: 0 % (ref 0.0–0.2)

## 2023-02-21 LAB — CMP (CANCER CENTER ONLY)
ALT: 19 U/L (ref 0–44)
AST: 16 U/L (ref 15–41)
Albumin: 3.5 g/dL (ref 3.5–5.0)
Alkaline Phosphatase: 65 U/L (ref 38–126)
Anion gap: 9 (ref 5–15)
BUN: 7 mg/dL (ref 6–20)
CO2: 26 mmol/L (ref 22–32)
Calcium: 9.1 mg/dL (ref 8.9–10.3)
Chloride: 106 mmol/L (ref 98–111)
Creatinine: 0.73 mg/dL (ref 0.61–1.24)
GFR, Estimated: >60 mL/min (ref >60)
Glucose, Bld: 107 mg/dL — ABNORMAL HIGH (ref 70–99)
Potassium: 3.2 mmol/L — ABNORMAL LOW (ref 3.5–5.1)
Sodium: 141 mmol/L (ref 135–145)
Total Bilirubin: 0.6 mg/dL (ref 0.3–1.2)
Total Protein: 5.5 g/dL — ABNORMAL LOW (ref 6.5–8.1)

## 2023-02-21 MED ORDER — ATROPINE SULFATE 1 MG/ML IV SOLN
0.5000 mg | Freq: Once | INTRAVENOUS | Status: AC | PRN
  Administered 2023-02-21: 0.5 mg via INTRAVENOUS
  Filled 2023-02-21: qty 1

## 2023-02-21 MED ORDER — SODIUM CHLORIDE 0.9 % IV SOLN
60.0000 mg/m2 | Freq: Once | INTRAVENOUS | Status: AC
  Administered 2023-02-21: 146.2 mg via INTRAVENOUS
  Filled 2023-02-21: qty 34

## 2023-02-21 MED ORDER — HEPARIN SOD (PORK) LOCK FLUSH 100 UNIT/ML IV SOLN: 500.0000 [IU] | Freq: Once | INTRAVENOUS | Status: DC | PRN

## 2023-02-21 MED ORDER — PALONOSETRON HCL INJECTION 0.25 MG/5ML
0.2500 mg | Freq: Once | INTRAVENOUS | Status: AC
  Administered 2023-02-21: 0.25 mg via INTRAVENOUS
  Filled 2023-02-21: qty 5

## 2023-02-21 MED ORDER — SODIUM CHLORIDE 0.9 % IV SOLN
400.0000 mg/m2 | Freq: Once | INTRAVENOUS | Status: AC
  Administered 2023-02-21: 980 mg via INTRAVENOUS
  Filled 2023-02-21: qty 49

## 2023-02-21 MED ORDER — SODIUM CHLORIDE 0.9% FLUSH
10.0000 mL | Freq: Once | INTRAVENOUS | Status: AC
  Administered 2023-02-21: 10 mL

## 2023-02-21 MED ORDER — SODIUM CHLORIDE 0.9 % IV SOLN
10.0000 mg | Freq: Once | INTRAVENOUS | Status: AC
  Administered 2023-02-21: 10 mg via INTRAVENOUS
  Filled 2023-02-21: qty 10

## 2023-02-21 MED ORDER — VALACYCLOVIR HCL 500 MG PO TABS: 500.0000 mg | ORAL_TABLET | Freq: Two times a day (BID) | ORAL | 3 refills | Status: DC

## 2023-02-21 MED ORDER — SODIUM CHLORIDE 0.9% FLUSH: 10.0000 mL | INTRAVENOUS | Status: DC | PRN

## 2023-02-21 MED ORDER — SODIUM CHLORIDE 0.9 % IV SOLN: Freq: Once | INTRAVENOUS | Status: AC

## 2023-02-21 MED ORDER — SODIUM CHLORIDE 0.9 % IV SOLN
2400.0000 mg/m2 | INTRAVENOUS | Status: DC
  Administered 2023-02-21: 5900 mg via INTRAVENOUS
  Filled 2023-02-21: qty 118

## 2023-02-21 NOTE — Progress Notes (Signed)
[facility] Cancer Follow up:    Steve Floro, MD [address] [place] [address]   DIAGNOSIS:  Cancer Staging  Pancreatic cancer metastasized to liver [facility]) Staging form: Exocrine Pancreas, AJCC 8th Edition - Clinical stage from 02/28/2022: Stage IV (cT2, cN1, pM1) - Signed by [name], MD on 03/02/2022 Stage prefix: Initial diagnosis   SUMMARY OF ONCOLOGIC HISTORY: Oncology History Overview Note   Cancer Staging  Pancreatic cancer metastasized to liver [facility]) Staging form: Exocrine Pancreas, AJCC 8th Edition - Clinical stage from 02/28/2022: Stage IV (cT2, cN1, pM1) - Signed by [name], MD on 03/02/2022 Stage prefix: Initial diagnosis     Pancreatic cancer metastasized to liver (HCC)  02/28/2022 Cancer Staging   Staging form: Exocrine Pancreas, AJCC 8th Edition - Clinical stage from 02/28/2022: Stage IV (cT2, cN1, pM1) - Signed by [name], MD on 03/02/2022 Stage prefix: Initial diagnosis   03/02/2022 Initial Diagnosis   Pancreatic cancer metastasized to liver (HCC)   03/13/2022 - 04/13/2022 Chemotherapy   Patient is on Treatment Plan : PANCREAS Modified FOLFIRINOX q14d x 4 cycles     03/13/2022 - 05/17/2022 Chemotherapy   Patient is on Treatment Plan : PANCREAS Modified FOLFIRINOX q14d x 4 cycles     03/18/2022 Genetic Testing   Negative genetic testing on the CancerNext-Expanded+RNAinsight panel.  APC [id number] VUs identified.  The report date is March 18, 2022.  The CancerNext-Expanded gene panel offered by [facility] and includes sequencing and rearrangement analysis for the following 77 genes: AIP, ALK, APC*, ATM*, AXIN2, BAP1, BARD1, BLM, BMPR1A, BRCA1*, BRCA2*, BRIP1*, CDC73, CDH1*, CDK4, CDKN1B, CDKN2A, CHEK2*, CTNNA1, DICER1, FANCC, FH, FLCN, GALNT12, KIF1B, LZTR1, MAX, MEN1, MET, MLH1*, MSH2*, MSH3, MSH6*, MUTYH*, NBN, NF1*, NF2, NTHL1, PALB2*, PHOX2B, PMS2*, POT1, PRKAR1A, PTCH1, PTEN*, RAD51C*, RAD51D*, RB1, RECQL, RET, SDHA, SDHAF2,  SDHB, SDHC, SDHD, SMAD4, SMARCA4, SMARCB1, SMARCE1, STK11, SUFU, TMEM127, TP53*, TSC1, TSC2, VHL and XRCC2 (sequencing and deletion/duplication); EGFR, EGLN1, HOXB13, KIT, MITF, PDGFRA, POLD1, and POLE (sequencing only); EPCAM and GREM1 (deletion/duplication only). DNA and RNA analyses performed for * genes.    05/25/2022 Progression   Rising CA 19-9, CT CAP IMPRESSION: 1. Multiple new and enlarged hypodense, rim enhancing liver metastases. 2. Unchanged, ill-defined hypoenhancing mass of the pancreatic uncinate, consistent with pancreatic adenocarcinoma 3. Unchanged small prominent portacaval and gastrohepatic ligament nodes, modestly suspicious for nodal metastases. No overtly enlarged lymph nodes in the abdomen or pelvis. 4. Prostatomegaly.    06/05/2022 - 11/13/2022 Chemotherapy   Patient is on Treatment Plan : PANCREATIC Abraxane D1,8,15 + Gemcitabine D1,8,15 q28d     11/24/2022 Imaging   IMPRESSION: 1. Today's study demonstrates mild progression of disease as evidenced by slight enlargement of the mass in the uncinate process of the pancreas which demonstrates potential direct invasion into the superior wall of the third portion of the duodenum, but currently demonstrates no definite vascular involvement. Multiple hepatic lesions are generally stable to the prior study, with exception of enlargement of a lesion in segment 8, as detailed above. 2. No new sites of metastatic disease are noted elsewhere in the chest or pelvis. 3. Aortic atherosclerosis.  Rising CA 19-9    12/04/2022 -  Chemotherapy   Patient is on Treatment Plan : PANCREAS Liposomal Irinotecan + Leucovorin + 5-FU IVCI q14d       CURRENT THERAPY: Liposomal irinotecan, Leucovorin, 5-FU every 14 days  INTERVAL HISTORY: Steve Mills 57 y.o. male returns for follow-up and evaluation prior to receiving  treatment for his pancreatic cancer. Diarrhea--stable managed with imodium.   Neuropathy--stable, not worsening  He  reports having had a fever blister that developed and is healing.  He wants to know if he can have valtrex that he has taken before when he gets a fever blister.   We are following his CA 19-9 which has been as follows:   Component     Latest Ref Rng 11/06/2022 12/04/2022 12/18/2022 01/01/2023 01/22/2023 02/05/2023  CA 19-9     0 - 35 U/mL 48 (H)  47 (H)  39 (H)  25  34  32     Legend: (H) High Patient Active Problem List   Diagnosis Date Noted   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   Port-A-Cath in place 04/11/2022   Genetic testing 03/27/2022   Family history of breast cancer 03/08/2022   Family history of colon cancer 03/08/2022   Pancreatic cancer metastasized to liver (HCC) 03/02/2022   Pancreatic mass 02/27/2022   S/P laparoscopic cholecystectomy Nov 2019 07/12/2018   Cholecystitis with cholelithiasis 07/11/2018   Pain in right hip 10/13/2016   Incarcerated ventral hernia 07/15/2014   Ventral hernia with obstruction 03/24/2014   Obesity, unspecified 03/24/2014    has No Known Allergies.  MEDICAL HISTORY: Past Medical History:  Diagnosis Date   Family history of adverse reaction to anesthesia    mother allergic to ether    Family history of breast cancer    Family history of colon cancer    Pneumonia    hx of 2014     SURGICAL HISTORY: Past Surgical History:  Procedure Laterality Date   APPENDECTOMY     CHOLECYSTECTOMY N/A 07/12/2018   Procedure: LAPAROSCOPIC CHOLECYSTECTOMY WITH INTRAOPERATIVE CHOLANGIOGRAM;  Surgeon: Luretha Murphy, MD;  Location: [facility] ORS;  Service: General;  Laterality: N/A;   left wrist surgery      has plate in arm    NASAL POLYP SURGERY  2017   PORTACATH PLACEMENT N/A 03/10/2022   Procedure: INSERTION PORT-A-CATH;  Surgeon: Fritzi Mandes, MD;  Location: [facility] OR;  Service: General;  Laterality: N/A;   VENTRAL HERNIA REPAIR N/A 07/15/2014   Procedure: LAPAROSCOPIC REPAIR INCARCARTED UMBILICAL  VENTRAL HERNIA, ;  Surgeon:  Claud Kelp, MD;  Location: [facility] ORS;  Service: General;  Laterality: N/A;  With MESH    SOCIAL HISTORY: Social History   Socioeconomic History   Marital status: Married    Spouse name: Not on file   Number of children: 3   Years of education: Not on file   Highest education level: Not on file  Occupational History   Not on file  Tobacco Use   Smoking status: Former    Packs/day: 2.50    Years: 20.00    Additional pack years: 0.00    Total pack years: 50.00    Types: Cigarettes    Quit date: 11/04/2013    Years since quitting: 9.3   Smokeless tobacco: Never  Vaping Use   Vaping Use: Never used  Substance and Sexual Activity   Alcohol use: No    Comment: he used to drink alcohol for 5 years when he was young   Drug use: No   Sexual activity: Not on file  Other Topics Concern   Not on file  Social History Narrative   Not on file   Social Determinants of Health   Financial Resource Strain: Not on file  Food Insecurity: Not on file  Transportation Needs: Not on file  Physical  Activity: Not on file  Stress: Not on file  Social Connections: Not on file  Intimate Partner Violence: Not on file    FAMILY HISTORY: Family History  Problem Relation Age of Onset   Lung cancer Mother 60   Breast cancer Mother        dx in her 24s or possibly 45s   Lung cancer Father 77   Breast cancer Maternal Aunt    Brain cancer Maternal Uncle    Lung cancer Paternal Aunt    Liver cancer Paternal Uncle    Colon cancer Maternal Grandfather    Cervical cancer Niece 43       cervical v uterine    Review of Systems  Constitutional:  Positive for fatigue. Negative for appetite change, chills, fever and unexpected weight change.  HENT:   Negative for hearing loss, lump/mass and trouble swallowing.   Eyes:  Negative for eye problems and icterus.  Respiratory:  Negative for chest tightness, cough and shortness of breath.   Cardiovascular:  Negative for chest pain, leg swelling and  palpitations.  Gastrointestinal:  Positive for diarrhea. Negative for abdominal distention, abdominal pain, constipation, nausea and vomiting.  Endocrine: Negative for hot flashes.  Genitourinary:  Negative for difficulty urinating.   Musculoskeletal:  Negative for arthralgias.  Skin:  Negative for itching and rash.  Neurological:  Negative for dizziness, extremity weakness, headaches and numbness.  Hematological:  Negative for adenopathy. Does not bruise/bleed easily.  Psychiatric/Behavioral:  Negative for depression. The patient is not nervous/anxious.       PHYSICAL EXAMINATION   Onc Performance Status - 02/21/23 1041       ECOG Perf Status   ECOG Perf Status Restricted in physically strenuous activity but ambulatory and able to carry out work of a light or sedentary nature, e.g., light house work, office work      KPS SCALE   KPS % SCORE Able to carry on normal activity, minor s/s of disease             Vitals:   02/21/23 1034  BP: (!) 134/94  Pulse: 73  Resp: 18  Temp: 97.9 F (36.6 C)  SpO2: 96%    Physical Exam Constitutional:      General: He is not in acute distress.    Appearance: Normal appearance. He is not toxic-appearing.  HENT:     Head: Normocephalic and atraumatic.     Mouth/Throat:     Mouth: Mucous membranes are moist.     Pharynx: Oropharynx is clear. No oropharyngeal exudate or posterior oropharyngeal erythema.  Eyes:     General: No scleral icterus. Cardiovascular:     Rate and Rhythm: Normal rate and regular rhythm.     Pulses: Normal pulses.     Heart sounds: Normal heart sounds.  Pulmonary:     Effort: Pulmonary effort is normal.     Breath sounds: Normal breath sounds.  Abdominal:     General: Abdomen is flat. Bowel sounds are normal. There is no distension.     Palpations: Abdomen is soft.     Tenderness: There is no abdominal tenderness.  Musculoskeletal:        General: No swelling.     Cervical back: Neck supple.   Lymphadenopathy:     Cervical: No cervical adenopathy.  Skin:    General: Skin is warm and dry.     Findings: No rash.  Neurological:     General: No focal deficit present.  Mental Status: He is alert.  Psychiatric:        Mood and Affect: Mood normal.        Behavior: Behavior normal.     LABORATORY DATA:  CBC    Component Value Date/Time   WBC 5.8 02/21/2023 1009   WBC 10.4 08/14/2022 0019   RBC 3.97 (L) 02/21/2023 1009   HGB 12.2 (L) 02/21/2023 1009   HCT 35.8 (L) 02/21/2023 1009   PLT 290 02/21/2023 1009   MCV 90.2 02/21/2023 1009   MCH 30.7 02/21/2023 1009   MCHC 34.1 02/21/2023 1009   RDW 15.3 02/21/2023 1009   LYMPHSABS 2.0 02/21/2023 1009   MONOABS 0.6 02/21/2023 1009   EOSABS 0.6 (H) 02/21/2023 1009   BASOSABS 0.1 02/21/2023 1009    CMP     Component Value Date/Time   NA 140 02/05/2023 1059   K 3.2 (L) 02/05/2023 1059   CL 105 02/05/2023 1059   CO2 28 02/05/2023 1059   GLUCOSE 117 (H) 02/05/2023 1059   BUN 8 02/05/2023 1059   CREATININE 0.69 02/05/2023 1059   CALCIUM 8.7 (L) 02/05/2023 1059   PROT 5.6 (L) 02/05/2023 1059   ALBUMIN 3.4 (L) 02/05/2023 1059   AST 15 02/05/2023 1059   ALT 17 02/05/2023 1059   ALKPHOS 74 02/05/2023 1059   BILITOT 0.5 02/05/2023 1059   GFRNONAA >60 02/05/2023 1059   GFRAA >60 07/12/2018 0448       ASSESSMENT and THERAPY PLAN:   Pancreatic cancer metastasized to liver [facility]) Steve Mills is a 57 year old man with metastatic pancreatic cancer currently on treatment with liposomal irinotecan, leucovorin, and 5-FU given every 14 days.  Metastatic pancreatic cancer: He has no signs of clinical progression today.  His labs are stable and he will proceed with treatment.  His next restaging scans are scheduled for March 06, 2023.  I went ahead and signed orders for him to get scheduled for his next chemotherapy which is due on March 07, 2023. Diarrhea: Managed with Imodium.  C-Met is pending today. Peripheral neuropathy: This  is stable and unchanged.  Will continue to monitor. Fever blister: I sent in Valtrex 500mg  p.o. twice daily for him to take.  My nurse Vernona Rieger will call [facility] to understand if this is his usual dosage.  Steve Mills will return to clinic on July 17 for labs, follow-up, and his next treatment.  His next restaging scans will occur on March 06, 2023.  All questions were answered. The patient knows to call the clinic with any problems, questions or concerns. We can certainly see the patient much sooner if necessary.  Total encounter time:20 minutes*in face-to-face visit time, chart review, lab review, care coordination, order entry, and documentation of the encounter time.  Lillard Anes, NP 02/21/23 11:06 AM Medical Oncology and Hematology [facility] [address] [place], [place] [address] Tel. [phone]    Fax. [phone]  *Total Encounter Time as defined by the Centers for Medicare and Medicaid Services includes, in addition to the face-to-face time of a patient visit (documented in the note above) non-face-to-face time: obtaining and reviewing outside history, ordering and reviewing medications, tests or procedures, care coordination (communications with other health care professionals or caregivers) and documentation in the medical record.

## 2023-02-21 NOTE — Patient Instructions (Addendum)
[facility]  Discharge Instructions: Thank you for choosing [facility] to provide your oncology and hematology care.   If you have a lab appointment with the Cancer Center, please go directly to the Cancer Center and check in at the registration area.   Wear comfortable clothing and clothing appropriate for easy access to any Portacath or PICC line.   We strive to give you quality time with your provider. You may need to reschedule your appointment if you arrive late (15 or more minutes).  Arriving late affects you and other patients whose appointments are after yours.  Also, if you miss three or more appointments without notifying the office, you may be dismissed from the clinic at the provider's discretion.      For prescription refill requests, have your pharmacy contact our office and allow 72 hours for refills to be completed.    Today you received the following chemotherapy and/or immunotherapy agents: Liposimal Irinotecan, Leucovorin, Fluorouracil.       To help prevent nausea and vomiting after your treatment, we encourage you to take your nausea medication as directed.  BELOW ARE SYMPTOMS THAT SHOULD BE REPORTED IMMEDIATELY: *FEVER GREATER THAN 100.4 F (38 C) OR HIGHER *CHILLS OR SWEATING *NAUSEA AND VOMITING THAT IS NOT CONTROLLED WITH YOUR NAUSEA MEDICATION *UNUSUAL SHORTNESS OF BREATH *UNUSUAL BRUISING OR BLEEDING *URINARY PROBLEMS (pain or burning when urinating, or frequent urination) *BOWEL PROBLEMS (unusual diarrhea, constipation, pain near the anus) TENDERNESS IN MOUTH AND THROAT WITH OR WITHOUT PRESENCE OF ULCERS (sore throat, sores in mouth, or a toothache) UNUSUAL RASH, SWELLING OR PAIN  UNUSUAL VAGINAL DISCHARGE OR ITCHING   Items with * indicate a potential emergency and should be followed up as soon as possible or go to the Emergency Department if any problems should occur.  Please show the CHEMOTHERAPY ALERT  CARD or IMMUNOTHERAPY ALERT CARD at check-in to the Emergency Department and triage nurse.  Should you have questions after your visit or need to cancel or reschedule your appointment, please contact [facility]  Dept: [phone]  and follow the prompts.  Office hours are 8:00 a.m. to 4:30 p.m. Monday - Friday. Please note that voicemails left after 4:00 p.m. may not be returned until the following business day.  We are closed weekends and major holidays. You have access to a nurse at all times for urgent questions. Please call the main number to the clinic Dept: [phone] and follow the prompts.   For any non-urgent questions, you may also contact your provider using MyChart. We now offer e-Visits for anyone 67 and older to request care online for non-urgent symptoms. For details visit [email or web address].   Also download the MyChart app! Go to the app store, search "MyChart", open the app, select [facility], and log in with your MyChart username and password.

## 2023-02-21 NOTE — Assessment & Plan Note (Addendum)
Steve Mills is a 57 year old man with metastatic pancreatic cancer currently on treatment with liposomal irinotecan, leucovorin, and 5-FU given every 14 days.  Metastatic pancreatic cancer: He has no signs of clinical progression today.  His labs are stable and he will proceed with treatment.  His next restaging scans are scheduled for March 06, 2023.  I went ahead and signed orders for him to get scheduled for his next chemotherapy which is due on March 07, 2023. Diarrhea: Managed with Imodium.  C-Met is pending today. Peripheral neuropathy: This is stable and unchanged.  Will continue to monitor. Fever blister: I sent in Valtrex 500mg  p.o. twice daily for him to take.  My nurse Vernona Rieger will [facility] to understand if this is his usual dosage.  [name] will return to clinic on July 17 for labs, follow-up, and his next treatment.  His next restaging scans will occur on March 06, 2023.

## 2023-02-22 LAB — CANCER ANTIGEN 19-9: CA 19-9: 42 U/mL — ABNORMAL HIGH (ref 0–35)

## 2023-02-23 ENCOUNTER — Inpatient Hospital Stay: Payer: BC Managed Care – PPO

## 2023-02-23 ENCOUNTER — Other Ambulatory Visit: Payer: Self-pay

## 2023-02-23 VITALS — BP 118/82 | HR 67 | Temp 98.7°F | Resp 18

## 2023-02-23 DIAGNOSIS — Z5111 Encounter for antineoplastic chemotherapy: Secondary | ICD-10-CM | POA: Diagnosis not present

## 2023-02-23 DIAGNOSIS — C787 Secondary malignant neoplasm of liver and intrahepatic bile duct: Secondary | ICD-10-CM | POA: Diagnosis not present

## 2023-02-23 DIAGNOSIS — Z452 Encounter for adjustment and management of vascular access device: Secondary | ICD-10-CM | POA: Diagnosis not present

## 2023-02-23 DIAGNOSIS — R197 Diarrhea, unspecified: Secondary | ICD-10-CM | POA: Diagnosis not present

## 2023-02-23 DIAGNOSIS — B001 Herpesviral vesicular dermatitis: Secondary | ICD-10-CM | POA: Diagnosis not present

## 2023-02-23 DIAGNOSIS — G629 Polyneuropathy, unspecified: Secondary | ICD-10-CM | POA: Diagnosis not present

## 2023-02-23 DIAGNOSIS — C259 Malignant neoplasm of pancreas, unspecified: Secondary | ICD-10-CM

## 2023-02-23 DIAGNOSIS — C250 Malignant neoplasm of head of pancreas: Secondary | ICD-10-CM | POA: Diagnosis not present

## 2023-02-23 MED ORDER — SODIUM CHLORIDE 0.9% FLUSH
10.0000 mL | INTRAVENOUS | Status: DC | PRN
  Administered 2023-02-23: 10 mL

## 2023-02-23 MED ORDER — HEPARIN SOD (PORK) LOCK FLUSH 100 UNIT/ML IV SOLN
500.0000 [IU] | Freq: Once | INTRAVENOUS | Status: AC | PRN
  Administered 2023-02-23: 500 [IU]

## 2023-02-26 ENCOUNTER — Telehealth: Payer: Self-pay | Admitting: Adult Health

## 2023-02-26 NOTE — Telephone Encounter (Signed)
Scheduled appointments per WQ. Patient is aware of the made appointments.  

## 2023-02-27 ENCOUNTER — Other Ambulatory Visit: Payer: Self-pay

## 2023-03-05 DIAGNOSIS — C787 Secondary malignant neoplasm of liver and intrahepatic bile duct: Secondary | ICD-10-CM | POA: Diagnosis not present

## 2023-03-05 DIAGNOSIS — C259 Malignant neoplasm of pancreas, unspecified: Secondary | ICD-10-CM | POA: Diagnosis not present

## 2023-03-06 ENCOUNTER — Ambulatory Visit (HOSPITAL_COMMUNITY)
Admission: RE | Admit: 2023-03-06 | Discharge: 2023-03-06 | Disposition: A | Discharge status: Home / Self Care | Payer: BC Managed Care – PPO | Source: Ambulatory Visit | Attending: Hematology | Admitting: Hematology

## 2023-03-06 DIAGNOSIS — C787 Secondary malignant neoplasm of liver and intrahepatic bile duct: Secondary | ICD-10-CM | POA: Insufficient documentation

## 2023-03-06 DIAGNOSIS — C259 Malignant neoplasm of pancreas, unspecified: Secondary | ICD-10-CM | POA: Diagnosis not present

## 2023-03-06 DIAGNOSIS — K8689 Other specified diseases of pancreas: Secondary | ICD-10-CM | POA: Diagnosis not present

## 2023-03-06 DIAGNOSIS — R935 Abnormal findings on diagnostic imaging of other abdominal regions, including retroperitoneum: Secondary | ICD-10-CM | POA: Diagnosis not present

## 2023-03-06 MED ORDER — IOHEXOL 300 MG/ML  SOLN
100.0000 mL | Freq: Once | INTRAMUSCULAR | Status: AC | PRN
  Administered 2023-03-06: 100 mL via INTRAVENOUS

## 2023-03-09 ENCOUNTER — Inpatient Hospital Stay (HOSPITAL_BASED_OUTPATIENT_CLINIC_OR_DEPARTMENT_OTHER): Payer: BC Managed Care – PPO | Admitting: Hematology

## 2023-03-09 DIAGNOSIS — C259 Malignant neoplasm of pancreas, unspecified: Secondary | ICD-10-CM | POA: Diagnosis not present

## 2023-03-09 DIAGNOSIS — C787 Secondary malignant neoplasm of liver and intrahepatic bile duct: Secondary | ICD-10-CM | POA: Diagnosis not present

## 2023-03-09 NOTE — Progress Notes (Signed)
[facility]   [phone] [phone]   Clinic Follow up Note   Patient Care Team: [name], MD as PCP - General (Family Medicine) [name], MD as Consulting Physician (Oncology) Tito Dine., MD as Consulting Physician (General Surgery)  Date of Service:  03/09/2023  I connected with Steve Mills on 03/09/2023 at  1:40 PM EDT by telephone visit and verified that I am speaking with the correct person using two identifiers.  I discussed the limitations, risks, security and privacy concerns of performing an evaluation and management service by telephone and the availability of in person appointments. I also discussed with the patient that there may be a patient responsible charge related to this service. The patient expressed understanding and agreed to proceed.   Other persons participating in the visit and their role in the encounter:  none   Patient's location:  home  Provider's location:  office   CHIEF COMPLAINT: f/u of Pancreatic cancer metastasized to liver (HCC)   CURRENT THERAPY:  Liposomal irinotecan, Leucovorin, 5-FU every 14 days    ASSESSMENT & PLAN:  Steve Mills is a 57 y.o. male with   Pancreatic cancer metastasized to liver [facility]) metastatic to liver, stage IV, KRAS G12R (+), MMR normal  -incidental finding on lung cancer screening chest CT on 01/19/22. 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  -He began first-line palliative FOLFIRINOX on 03/13/22, but unfortunately did not respond well. CA 19-9 also rose on treatment. -he switched to second line gemcitabine/abraxane on 06/05/22. He tolerates moderate well and has continued to work full time. CA 19-9 has dropped on treatment. -restaging CT from 08/24/22 showed partial response to treatment, improved primary tumor, and liver/node mets, I discussed with  him -will continue current chemo   -Pt was referred to see Dr. [name] on 10/10/2022 , surgery was discussed but not felt to be a surgical candidate due to his multiple liver metastasis (5) -Restaging CT scan on November 24, 2022 showed moderate disease progression in liver and the pancreas, his tumor marker also increased.  I have changed his chemotherapy to 5-FU and liposomal irinotecan, started on 12/04/22 -He tolerated first cycle chemotherapy well, but developed significant diarrhea after cycle 2, cycle 2 chemo was postponed for a week -he tolerated cycle 3 and 4 chemo well with mild diarrhea, which is controlled with Imodium.  He did not tolerated Lomotil. -I personally reviewed his restaging CT scan from March 05, 2023, which showed slightly improved pancreatitis, and slightly increased hepatic metastasis.  The change of his hepatic metastasis is mild to me, his tumor marker has been normal lately, but is slightly increased 2 weeks ago.  No other evidence of new metastasis.  Given the very limited treatment options, he is clinically doing well, I will continue current therapy and repeat a CT scan in 2 months.  He agrees with the plan.   Peripheral neuropathy due to chemotherapy (HCC) -Started in January 2024, secondary to oxaliplatin and Abraxane -Overall just numbness in fingers and toes, mild tingling, will continue to watch closely -He is taking B12, and use ice packs during chemoinfusion.  -overall mild and stable    Plan -CT scan reviewed, overall stable disease/mild progression  -will continue current therapy, next treatment on 7/25.   SUMMARY OF ONCOLOGIC HISTORY: Oncology History Overview Note   Cancer Staging  Pancreatic cancer metastasized  to liver [facility]) Staging form: Exocrine Pancreas, AJCC 8th Edition - Clinical stage from 02/28/2022: Stage IV (cT2, cN1, pM1) - Signed by [name], MD on 03/02/2022 Stage prefix: Initial diagnosis     Pancreatic cancer metastasized to liver [facility])   02/28/2022 Cancer Staging   Staging form: Exocrine Pancreas, AJCC 8th Edition - Clinical stage from 02/28/2022: Stage IV (cT2, cN1, pM1) - Signed by [name], MD on 03/02/2022 Stage prefix: Initial diagnosis   03/02/2022 Initial Diagnosis   Pancreatic cancer metastasized to liver (HCC)   03/13/2022 - 04/13/2022 Chemotherapy   Patient is on Treatment Plan : PANCREAS Modified FOLFIRINOX q14d x 4 cycles     03/13/2022 - 05/17/2022 Chemotherapy   Patient is on Treatment Plan : PANCREAS Modified FOLFIRINOX q14d x 4 cycles     03/18/2022 Genetic Testing   Negative genetic testing on the CancerNext-Expanded+RNAinsight panel.  APC [id number] VUs identified.  The report date is March 18, 2022.  The CancerNext-Expanded gene panel offered by [facility] and includes sequencing and rearrangement analysis for the following 77 genes: AIP, ALK, APC*, ATM*, AXIN2, BAP1, BARD1, BLM, BMPR1A, BRCA1*, BRCA2*, BRIP1*, CDC73, CDH1*, CDK4, CDKN1B, CDKN2A, CHEK2*, CTNNA1, DICER1, FANCC, FH, FLCN, GALNT12, KIF1B, LZTR1, MAX, MEN1, MET, MLH1*, MSH2*, MSH3, MSH6*, MUTYH*, NBN, NF1*, NF2, NTHL1, PALB2*, PHOX2B, PMS2*, POT1, PRKAR1A, PTCH1, PTEN*, RAD51C*, RAD51D*, RB1, RECQL, RET, SDHA, SDHAF2, SDHB, SDHC, SDHD, SMAD4, SMARCA4, SMARCB1, SMARCE1, STK11, SUFU, TMEM127, TP53*, TSC1, TSC2, VHL and XRCC2 (sequencing and deletion/duplication); EGFR, EGLN1, HOXB13, KIT, MITF, PDGFRA, POLD1, and POLE (sequencing only); EPCAM and GREM1 (deletion/duplication only). DNA and RNA analyses performed for * genes.    05/25/2022 Progression   Rising CA 19-9, CT CAP IMPRESSION: 1. Multiple new and enlarged hypodense, rim enhancing liver metastases. 2. Unchanged, ill-defined hypoenhancing mass of the pancreatic uncinate, consistent with pancreatic adenocarcinoma 3. Unchanged small prominent portacaval and gastrohepatic ligament nodes, modestly suspicious for nodal metastases. No overtly enlarged lymph nodes in the abdomen or pelvis. 4.  Prostatomegaly.    06/05/2022 - 11/13/2022 Chemotherapy   Patient is on Treatment Plan : PANCREATIC Abraxane D1,8,15 + Gemcitabine D1,8,15 q28d     11/24/2022 Imaging   IMPRESSION: 1. Today's study demonstrates mild progression of disease as evidenced by slight enlargement of the mass in the uncinate process of the pancreas which demonstrates potential direct invasion into the superior wall of the third portion of the duodenum, but currently demonstrates no definite vascular involvement. Multiple hepatic lesions are generally stable to the prior study, with exception of enlargement of a lesion in segment 8, as detailed above. 2. No new sites of metastatic disease are noted elsewhere in the chest or pelvis. 3. Aortic atherosclerosis.  Rising CA 19-9    12/04/2022 -  Chemotherapy   Patient is on Treatment Plan : PANCREAS Liposomal Irinotecan + Leucovorin + 5-FU IVCI q14d     03/06/2023 Imaging    IMPRESSION: 1. Mass in the pancreatic head demonstrates decreased size since prior study. 2. Multiple liver metastasis are mildly increased in size. No new lesions. 3. 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 Metastatic disease would be less likely. 4. Mild aortic atherosclerosis. 5. Nonspecific sclerotic foci in the pelvis, unchanged, likely benign bone islands.        INTERVAL HISTORY:  AKIM WATKINSON was contacted for a follow up of Pancreatic cancer metastasized to liver [facility])  He was last seen  by NP Mardella Layman on 02/21/23.  He tolerated the last cycle of chemotherapy very well, with mild diarrhea furious, no other side effects.  His appetite and energy are near normal at this week, doing well overall.    All other systems were reviewed with the patient and are negative.  MEDICAL HISTORY:  Past Medical History:  Diagnosis Date   Family history of  adverse reaction to anesthesia    mother allergic to ether    Family history of breast cancer    Family history of colon cancer    Pneumonia    hx of 2014     SURGICAL HISTORY: Past Surgical History:  Procedure Laterality Date   APPENDECTOMY     CHOLECYSTECTOMY N/A 07/12/2018   Procedure: LAPAROSCOPIC CHOLECYSTECTOMY WITH INTRAOPERATIVE CHOLANGIOGRAM;  Surgeon: Luretha Murphy, MD;  Location: [facility] ORS;  Service: General;  Laterality: N/A;   left wrist surgery      has plate in arm    NASAL POLYP SURGERY  2017   PORTACATH PLACEMENT N/A 03/10/2022   Procedure: INSERTION PORT-A-CATH;  Surgeon: Fritzi Mandes, MD;  Location: [facility] OR;  Service: General;  Laterality: N/A;   VENTRAL HERNIA REPAIR N/A 07/15/2014   Procedure: LAPAROSCOPIC REPAIR INCARCARTED UMBILICAL  VENTRAL HERNIA, ;  Surgeon: Claud Kelp, MD;  Location: [facility] ORS;  Service: General;  Laterality: N/A;  With MESH    I have reviewed the social history and family history with the patient and they are unchanged from previous note.  ALLERGIES:  has No Known Allergies.  MEDICATIONS:  Current Outpatient Medications  Medication Sig Dispense Refill   dexamethasone (DECADRON) 4 MG tablet Take 2 tablets (8 mg total) by mouth daily. Start the day after irinotecan chemotherapy for 2 days. Take with food. (Patient not taking: Reported on 12/18/2022) 8 tablet 5   KLOR-CON M20 20 MEQ tablet TAKE 1 TABLET BY MOUTH 2 TIMES A DAY FOR 5 DAYS THEN TAKE 1 TABLET DAILY (Patient not taking: Reported on 02/21/2023) 35 tablet 0   lactobacillus acidophilus & bulgar (LACTINEX) chewable tablet CHEW 1 TABLET BY MOUTH 3 (THREE) TIMES DAILY WITH MEALS. (Patient not taking: Reported on 12/18/2022) 40 tablet 0   lidocaine-prilocaine (EMLA) cream Apply to affected area once 30 g 3   loperamide (IMODIUM A-D) 2 MG tablet Take 2 mg by mouth 4 (four) times daily as needed for diarrhea or loose stools.     ondansetron (ZOFRAN) 8 MG tablet Take 1 tablet (8 mg total) by  mouth every 8 (eight) hours as needed for nausea or vomiting. Start on the third day after irinotecan (Patient not taking: Reported on 02/21/2023) 30 tablet 1   prochlorperazine (COMPAZINE) 10 MG tablet Take 1 tablet (10 mg total) by mouth every 6 (six) hours as needed for nausea or vomiting. (Patient not taking: Reported on 02/21/2023) 30 tablet 1   valACYclovir (VALTREX) 500 MG tablet Take 1 tablet (500 mg total) by mouth 2 (two) times daily. 60 tablet 3   No current facility-administered medications for this visit.    PHYSICAL EXAMINATION: ECOG PERFORMANCE STATUS: 0 - Asymptomatic  There were no vitals filed for this visit. Wt Readings from Last 3 Encounters:  02/21/23 246 lb 9.6 oz (111.9 kg)  02/05/23 250 lb 14.4 oz (113.8 kg)  01/22/23 251 lb 12.8 oz (114.2 kg)     No vitals taken today, Exam not performed today  LABORATORY DATA:  I have reviewed the data as listed    Latest Ref Rng &  Units 02/21/2023   10:09 AM 02/05/2023   10:59 AM 01/22/2023   10:17 AM  CBC  WBC 4.0 - 10.5 K/uL 5.8  6.9  7.2   Hemoglobin 13.0 - 17.0 g/dL 96.0  45.4  09.8   Hematocrit 39.0 - 52.0 % 35.8  34.7  34.8   Platelets 150 - 400 K/uL 290  262  286         Latest Ref Rng & Units 02/21/2023   10:09 AM 02/05/2023   10:59 AM 01/22/2023   10:17 AM  CMP  Glucose 70 - 99 mg/dL 119  147  829   BUN 6 - 20 mg/dL 7  8  8    Creatinine 0.61 - 1.24 mg/dL 5.62  1.30  8.65   Sodium 135 - 145 mmol/L 141  140  139   Potassium 3.5 - 5.1 mmol/L 3.2  3.2  3.5   Chloride 98 - 111 mmol/L 106  105  106   CO2 22 - 32 mmol/L 26  28  26    Calcium 8.9 - 10.3 mg/dL 9.1  8.7  9.0   Total Protein 6.5 - 8.1 g/dL 5.5  5.6  6.1   Total Bilirubin 0.3 - 1.2 mg/dL 0.6  0.5  0.5   Alkaline Phos 38 - 126 U/L 65  74  67   AST 15 - 41 U/L 16  15  18    ALT 0 - 44 U/L 19  17  18        RADIOGRAPHIC STUDIES: I have personally reviewed the radiological images as listed and agreed with the findings in the report. No results found.     No orders of the defined types were placed in this encounter.  All questions were answered. The patient knows to call the clinic with any problems, questions or concerns. No barriers to learning was detected. The total time spent in the appointment was 22 minutes.     [name], MD 03/09/2023   Carolin Coy am acting as scribe for [name], MD.   I have reviewed the above documentation for accuracy and completeness, and I agree with the above.

## 2023-03-12 ENCOUNTER — Encounter: Payer: Self-pay | Admitting: Adult Health

## 2023-03-12 ENCOUNTER — Other Ambulatory Visit: Payer: Self-pay

## 2023-03-12 ENCOUNTER — Inpatient Hospital Stay: Payer: BC Managed Care – PPO | Admitting: Adult Health

## 2023-03-12 ENCOUNTER — Inpatient Hospital Stay: Payer: BC Managed Care – PPO

## 2023-03-12 DIAGNOSIS — C787 Secondary malignant neoplasm of liver and intrahepatic bile duct: Secondary | ICD-10-CM | POA: Diagnosis not present

## 2023-03-12 DIAGNOSIS — G629 Polyneuropathy, unspecified: Secondary | ICD-10-CM | POA: Diagnosis not present

## 2023-03-12 DIAGNOSIS — Z5111 Encounter for antineoplastic chemotherapy: Secondary | ICD-10-CM | POA: Diagnosis not present

## 2023-03-12 DIAGNOSIS — C259 Malignant neoplasm of pancreas, unspecified: Secondary | ICD-10-CM | POA: Diagnosis not present

## 2023-03-12 DIAGNOSIS — Z452 Encounter for adjustment and management of vascular access device: Secondary | ICD-10-CM | POA: Diagnosis not present

## 2023-03-12 DIAGNOSIS — B001 Herpesviral vesicular dermatitis: Secondary | ICD-10-CM | POA: Diagnosis not present

## 2023-03-12 DIAGNOSIS — R197 Diarrhea, unspecified: Secondary | ICD-10-CM | POA: Diagnosis not present

## 2023-03-12 DIAGNOSIS — C250 Malignant neoplasm of head of pancreas: Secondary | ICD-10-CM | POA: Diagnosis not present

## 2023-03-12 DIAGNOSIS — Z95828 Presence of other vascular implants and grafts: Secondary | ICD-10-CM

## 2023-03-12 LAB — CMP (CANCER CENTER ONLY)
ALT: 22 U/L (ref 0–44)
AST: 20 U/L (ref 15–41)
Albumin: 3.6 g/dL (ref 3.5–5.0)
Alkaline Phosphatase: 75 U/L (ref 38–126)
Anion gap: 8 (ref 5–15)
BUN: 8 mg/dL (ref 6–20)
CO2: 27 mmol/L (ref 22–32)
Calcium: 8.9 mg/dL (ref 8.9–10.3)
Chloride: 106 mmol/L (ref 98–111)
Creatinine: 0.69 mg/dL (ref 0.61–1.24)
GFR, Estimated: >60 mL/min (ref >60)
Glucose, Bld: 129 mg/dL — ABNORMAL HIGH (ref 70–99)
Potassium: 3.4 mmol/L — ABNORMAL LOW (ref 3.5–5.1)
Sodium: 141 mmol/L (ref 135–145)
Total Bilirubin: 0.5 mg/dL (ref 0.3–1.2)
Total Protein: 5.9 g/dL — ABNORMAL LOW (ref 6.5–8.1)

## 2023-03-12 LAB — CBC WITH DIFFERENTIAL (CANCER CENTER ONLY)
Abs Immature Granulocytes: 0.1 10*3/uL — ABNORMAL HIGH (ref 0.00–0.07)
Basophils Absolute: 0.1 10*3/uL (ref 0.0–0.1)
Basophils Relative: 1 %
Eosinophils Absolute: 0.7 10*3/uL — ABNORMAL HIGH (ref 0.0–0.5)
Eosinophils Relative: 11 %
HCT: 35.1 % — ABNORMAL LOW (ref 39.0–52.0)
Hemoglobin: 12 g/dL — ABNORMAL LOW (ref 13.0–17.0)
Immature Granulocytes: 2 %
Lymphocytes Relative: 37 %
Lymphs Abs: 2.5 10*3/uL (ref 0.7–4.0)
MCH: 30.6 pg (ref 26.0–34.0)
MCHC: 34.2 g/dL (ref 30.0–36.0)
MCV: 89.5 fL (ref 80.0–100.0)
Monocytes Absolute: 0.8 10*3/uL (ref 0.1–1.0)
Monocytes Relative: 11 %
Neutro Abs: 2.6 10*3/uL (ref 1.7–7.7)
Neutrophils Relative %: 38 %
Platelet Count: 289 10*3/uL (ref 150–400)
RBC: 3.92 MIL/uL — ABNORMAL LOW (ref 4.22–5.81)
RDW: 15.4 % (ref 11.5–15.5)
WBC Count: 6.8 10*3/uL (ref 4.0–10.5)
nRBC: 0 % (ref 0.0–0.2)

## 2023-03-12 MED ORDER — PALONOSETRON HCL INJECTION 0.25 MG/5ML
0.2500 mg | Freq: Once | INTRAVENOUS | Status: AC
  Administered 2023-03-12: 0.25 mg via INTRAVENOUS
  Filled 2023-03-12: qty 5

## 2023-03-12 MED ORDER — SODIUM CHLORIDE 0.9 % IV SOLN
2400.0000 mg/m2 | INTRAVENOUS | Status: DC
  Administered 2023-03-12: 5900 mg via INTRAVENOUS
  Filled 2023-03-12: qty 118

## 2023-03-12 MED ORDER — SODIUM CHLORIDE 0.9 % IV SOLN
60.0000 mg/m2 | Freq: Once | INTRAVENOUS | Status: AC
  Administered 2023-03-12: 146.2 mg via INTRAVENOUS
  Filled 2023-03-12: qty 34

## 2023-03-12 MED ORDER — ATROPINE SULFATE 1 MG/ML IV SOLN
0.5000 mg | Freq: Once | INTRAVENOUS | Status: DC | PRN
  Filled 2023-03-12: qty 1

## 2023-03-12 MED ORDER — SODIUM CHLORIDE 0.9% FLUSH
10.0000 mL | Freq: Once | INTRAVENOUS | Status: AC
  Administered 2023-03-12: 10 mL

## 2023-03-12 MED ORDER — SODIUM CHLORIDE 0.9 % IV SOLN: Freq: Once | INTRAVENOUS | Status: AC

## 2023-03-12 MED ORDER — SODIUM CHLORIDE 0.9 % IV SOLN
10.0000 mg | Freq: Once | INTRAVENOUS | Status: AC
  Administered 2023-03-12: 10 mg via INTRAVENOUS
  Filled 2023-03-12: qty 10

## 2023-03-12 MED ORDER — SODIUM CHLORIDE 0.9% FLUSH
10.0000 mL | INTRAVENOUS | Status: DC | PRN
  Administered 2023-03-12: 10 mL

## 2023-03-12 MED ORDER — SODIUM CHLORIDE 0.9 % IV SOLN
400.0000 mg/m2 | Freq: Once | INTRAVENOUS | Status: AC
  Administered 2023-03-12: 980 mg via INTRAVENOUS
  Filled 2023-03-12: qty 49

## 2023-03-12 MED ORDER — HEPARIN SOD (PORK) LOCK FLUSH 100 UNIT/ML IV SOLN: 500.0000 [IU] | Freq: Once | INTRAVENOUS | Status: DC | PRN

## 2023-03-12 NOTE — Progress Notes (Signed)
[facility] Cancer Follow up:    [name], MD [address] [place] [address]   DIAGNOSIS:  Cancer Staging  Pancreatic cancer metastasized to liver [facility]) Staging form: Exocrine Pancreas, AJCC 8th Edition - Clinical stage from 02/28/2022: Stage IV (cT2, cN1, pM1) - Signed by [name], MD on 03/02/2022 Stage prefix: Initial diagnosis   SUMMARY OF ONCOLOGIC HISTORY: Oncology History Overview Note   Cancer Staging  Pancreatic cancer metastasized to liver [facility]) Staging form: Exocrine Pancreas, AJCC 8th Edition - Clinical stage from 02/28/2022: Stage IV (cT2, cN1, pM1) - Signed by [name], MD on 03/02/2022 Stage prefix: Initial diagnosis     Pancreatic cancer metastasized to liver (HCC)  02/28/2022 Cancer Staging   Staging form: Exocrine Pancreas, AJCC 8th Edition - Clinical stage from 02/28/2022: Stage IV (cT2, cN1, pM1) - Signed by [name], MD on 03/02/2022 Stage prefix: Initial diagnosis   03/02/2022 Initial Diagnosis   Pancreatic cancer metastasized to liver (HCC)   03/13/2022 - 04/13/2022 Chemotherapy   Patient is on Treatment Plan : PANCREAS Modified FOLFIRINOX q14d x 4 cycles     03/13/2022 - 05/17/2022 Chemotherapy   Patient is on Treatment Plan : PANCREAS Modified FOLFIRINOX q14d x 4 cycles     03/18/2022 Genetic Testing   Negative genetic testing on the CancerNext-Expanded+RNAinsight panel.  APC [id number] VUs identified.  The report date is March 18, 2022.  The CancerNext-Expanded gene panel offered by [facility] and includes sequencing and rearrangement analysis for the following 77 genes: AIP, ALK, APC*, ATM*, AXIN2, BAP1, BARD1, BLM, BMPR1A, BRCA1*, BRCA2*, BRIP1*, CDC73, CDH1*, CDK4, CDKN1B, CDKN2A, CHEK2*, CTNNA1, DICER1, FANCC, FH, FLCN, GALNT12, KIF1B, LZTR1, MAX, MEN1, MET, MLH1*, MSH2*, MSH3, MSH6*, MUTYH*, NBN, NF1*, NF2, NTHL1, PALB2*, PHOX2B, PMS2*, POT1, PRKAR1A, PTCH1, PTEN*, RAD51C*, RAD51D*, RB1, RECQL, RET, SDHA, SDHAF2,  SDHB, SDHC, SDHD, SMAD4, SMARCA4, SMARCB1, SMARCE1, STK11, SUFU, TMEM127, TP53*, TSC1, TSC2, VHL and XRCC2 (sequencing and deletion/duplication); EGFR, EGLN1, HOXB13, KIT, MITF, PDGFRA, POLD1, and POLE (sequencing only); EPCAM and GREM1 (deletion/duplication only). DNA and RNA analyses performed for * genes.    05/25/2022 Progression   Rising CA 19-9, CT CAP IMPRESSION: 1. Multiple new and enlarged hypodense, rim enhancing liver metastases. 2. Unchanged, ill-defined hypoenhancing mass of the pancreatic uncinate, consistent with pancreatic adenocarcinoma 3. Unchanged small prominent portacaval and gastrohepatic ligament nodes, modestly suspicious for nodal metastases. No overtly enlarged lymph nodes in the abdomen or pelvis. 4. Prostatomegaly.    06/05/2022 - 11/13/2022 Chemotherapy   Patient is on Treatment Plan : PANCREATIC Abraxane D1,8,15 + Gemcitabine D1,8,15 q28d     11/24/2022 Imaging   IMPRESSION: 1. Today's study demonstrates mild progression of disease as evidenced by slight enlargement of the mass in the uncinate process of the pancreas which demonstrates potential direct invasion into the superior wall of the third portion of the duodenum, but currently demonstrates no definite vascular involvement. Multiple hepatic lesions are generally stable to the prior study, with exception of enlargement of a lesion in segment 8, as detailed above. 2. No new sites of metastatic disease are noted elsewhere in the chest or pelvis. 3. Aortic atherosclerosis.  Rising CA 19-9    12/04/2022 -  Chemotherapy   Patient is on Treatment Plan : PANCREAS Liposomal Irinotecan + Leucovorin + 5-FU IVCI q14d     03/06/2023 Imaging    IMPRESSION: 1. Mass in the pancreatic head demonstrates decreased size since prior study. 2. Multiple liver metastasis are mildly increased in size. No  new lesions. 3. 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 Metastatic disease would be less likely. 4. Mild aortic atherosclerosis. 5. Nonspecific sclerotic foci in the pelvis, unchanged, likely benign bone islands.       CURRENT THERAPY:Liposomal irinotecan, Leucovorin, 5-FU every 14 days   INTERVAL HISTORY: Steve Mills 57 y.o. male returns for follow-up prior to receiving chemotherapy.  He is tolerating this treatment moderately well.  He has longstanding peripheral neuropathy that is stable and unchanged.  He continues to have diarrhea that occurs for a few days following chemotherapy, however he manages this with Imodium and hydration.  He struggles with fatigue however he does continue to work and tells me he plans to work on small amounts of walking to see if that will help with his tiredness.  He denies any recent fevers or chills.  He does note some indigestion that he takes Prilosec for.  Otherwise he is feeling well and has no questions or concerns today.   Patient Active Problem List   Diagnosis Date Noted   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   Port-A-Cath in place 04/11/2022   Genetic testing 03/27/2022   Family history of breast cancer 03/08/2022   Family history of colon cancer 03/08/2022   Pancreatic cancer metastasized to liver (HCC) 03/02/2022   Pancreatic mass 02/27/2022   S/P laparoscopic cholecystectomy Nov 2019 07/12/2018   Cholecystitis with cholelithiasis 07/11/2018   Pain in right hip 10/13/2016   Incarcerated ventral hernia 07/15/2014   Ventral hernia with obstruction 03/24/2014   Obesity, unspecified 03/24/2014    has No Known Allergies.  MEDICAL HISTORY: Past Medical History:  Diagnosis Date   Family history of adverse reaction to anesthesia    mother allergic to ether    Family history of breast cancer    Family history of colon cancer    Pneumonia    hx of 2014     SURGICAL HISTORY: Past Surgical  History:  Procedure Laterality Date   APPENDECTOMY     CHOLECYSTECTOMY N/A 07/12/2018   Procedure: LAPAROSCOPIC CHOLECYSTECTOMY WITH INTRAOPERATIVE CHOLANGIOGRAM;  Surgeon: Luretha Murphy, MD;  Location: [facility] ORS;  Service: General;  Laterality: N/A;   left wrist surgery      has plate in arm    NASAL POLYP SURGERY  2017   PORTACATH PLACEMENT N/A 03/10/2022   Procedure: INSERTION PORT-A-CATH;  Surgeon: Fritzi Mandes, MD;  Location: [facility] OR;  Service: General;  Laterality: N/A;   VENTRAL HERNIA REPAIR N/A 07/15/2014   Procedure: LAPAROSCOPIC REPAIR INCARCARTED UMBILICAL  VENTRAL HERNIA, ;  Surgeon: Claud Kelp, MD;  Location: [facility] ORS;  Service: General;  Laterality: N/A;  With MESH    SOCIAL HISTORY: Social History   Socioeconomic History   Marital status: Married    Spouse name: Not on file   Number of children: 3   Years of education: Not on file   Highest education level: Not on file  Occupational History   Not on file  Tobacco Use   Smoking status: Former    Current packs/day: 0.00    Average packs/day: 2.5 packs/day for 20.0 years (50.0 ttl pk-yrs)    Types: Cigarettes    Start date: 11/04/1993    Quit date: 11/04/2013    Years since quitting: 9.3   Smokeless tobacco: Never  Vaping Use   Vaping status: Never Used  Substance and Sexual Activity  Alcohol use: No    Comment: he used to drink alcohol for 5 years when he was young   Drug use: No   Sexual activity: Not on file  Other Topics Concern   Not on file  Social History Narrative   Not on file   Social Determinants of Health   Financial Resource Strain: Not on file  Food Insecurity: Not on file  Transportation Needs: Not on file  Physical Activity: Not on file  Stress: Not on file  Social Connections: Not on file  Intimate Partner Violence: Not on file    FAMILY HISTORY: Family History  Problem Relation Age of Onset   Lung cancer Mother 60   Breast cancer Mother        dx in her 56s or possibly 54s    Lung cancer Father 1   Breast cancer Maternal Aunt    Brain cancer Maternal Uncle    Lung cancer Paternal Aunt    Liver cancer Paternal Uncle    Colon cancer Maternal Grandfather    Cervical cancer Niece 59       cervical v uterine    Review of Systems  Constitutional:  Positive for fatigue. Negative for appetite change, chills, fever and unexpected weight change.  HENT:   Negative for hearing loss, lump/mass, mouth sores and trouble swallowing.   Eyes:  Negative for eye problems and icterus.  Respiratory:  Negative for chest tightness, cough and shortness of breath.   Cardiovascular:  Negative for chest pain, leg swelling and palpitations.  Gastrointestinal:  Positive for diarrhea. Negative for abdominal distention, abdominal pain, constipation, nausea and vomiting.  Endocrine: Negative for hot flashes.  Genitourinary:  Negative for difficulty urinating.   Musculoskeletal:  Negative for arthralgias.  Skin:  Negative for itching and rash.  Neurological:  Negative for dizziness, extremity weakness, headaches and numbness.  Hematological:  Negative for adenopathy. Does not bruise/bleed easily.  Psychiatric/Behavioral:  Negative for depression. The patient is not nervous/anxious.       PHYSICAL EXAMINATION   Onc Performance Status - 03/12/23 1046       KPS SCALE   KPS % SCORE Normal, no compliants, no evidence of disease             Vitals:   03/12/23 1048  BP: 123/86  Pulse: 78  Resp: 18  Temp: (!) 97.5 F (36.4 C)  SpO2: 96%    Physical Exam Constitutional:      General: He is not in acute distress.    Appearance: Normal appearance. He is not toxic-appearing.  HENT:     Head: Normocephalic and atraumatic.     Mouth/Throat:     Mouth: Mucous membranes are moist.     Pharynx: Oropharynx is clear. No oropharyngeal exudate or posterior oropharyngeal erythema.  Eyes:     General: No scleral icterus. Cardiovascular:     Rate and Rhythm: Normal rate and  regular rhythm.     Pulses: Normal pulses.     Heart sounds: Normal heart sounds.  Pulmonary:     Effort: Pulmonary effort is normal.     Breath sounds: Normal breath sounds.  Abdominal:     General: Abdomen is flat. Bowel sounds are normal. There is no distension.     Palpations: Abdomen is soft.     Tenderness: There is no abdominal tenderness.  Musculoskeletal:        General: No swelling.     Cervical back: Neck supple.  Lymphadenopathy:  Cervical: No cervical adenopathy.  Skin:    General: Skin is warm and dry.     Findings: No rash.  Neurological:     General: No focal deficit present.     Mental Status: He is alert.  Psychiatric:        Mood and Affect: Mood normal.        Behavior: Behavior normal.     LABORATORY DATA:  CBC    Component Value Date/Time   WBC 6.8 03/12/2023 1017   WBC 10.4 08/14/2022 0019   RBC 3.92 (L) 03/12/2023 1017   HGB 12.0 (L) 03/12/2023 1017   HCT 35.1 (L) 03/12/2023 1017   PLT 289 03/12/2023 1017   MCV 89.5 03/12/2023 1017   MCH 30.6 03/12/2023 1017   MCHC 34.2 03/12/2023 1017   RDW 15.4 03/12/2023 1017   LYMPHSABS 2.5 03/12/2023 1017   MONOABS 0.8 03/12/2023 1017   EOSABS 0.7 (H) 03/12/2023 1017   BASOSABS 0.1 03/12/2023 1017    CMP     Component Value Date/Time   NA 141 02/21/2023 1009   K 3.2 (L) 02/21/2023 1009   CL 106 02/21/2023 1009   CO2 26 02/21/2023 1009   GLUCOSE 107 (H) 02/21/2023 1009   BUN 7 02/21/2023 1009   CREATININE 0.73 02/21/2023 1009   CALCIUM 9.1 02/21/2023 1009   PROT 5.5 (L) 02/21/2023 1009   ALBUMIN 3.5 02/21/2023 1009   AST 16 02/21/2023 1009   ALT 19 02/21/2023 1009   ALKPHOS 65 02/21/2023 1009   BILITOT 0.6 02/21/2023 1009   GFRNONAA >60 02/21/2023 1009   GFRAA >60 07/12/2018 0448     ASSESSMENT and THERAPY PLAN:   Pancreatic cancer metastasized to liver [facility]) Blaize is a 57 year old man with metastatic pancreatic cancer currently on treatment with liposomal irinotecan, leucovorin,  and 5-FU given every 14 days.  Metastatic pancreatic cancer: He has no signs of clinical progression today.  His labs are stable and he will proceed with treatment.  His most recent imaging occurred on July 16 and he reviewed the results with Dr. Blake Divine.  The recommendation is to repeat restaging in 2 months time.  I sent a message to scheduling for him to have more appointments scheduled for his subsequent treatments. Diarrhea: Managed with Imodium.  His kidney function and electrolytes are pending today. Peripheral neuropathy: This is stable and unchanged.  Will continue to monitor. Fever blister: Resolved. Cancer related fatigue: We discussed energy conservation in addition to small amounts of exercise to help combat the fatigue. Indigestion: Continue Prilosec and he will receive Famotidine pre-treatment today.   Ghassan will return to clinic on Wednesday for pump d/c and in 2 weeks for labs, f/u and his next treatment.    All questions were answered. The patient knows to call the clinic with any problems, questions or concerns. We can certainly see the patient much sooner if necessary.  Total encounter time:30 minutes*in face-to-face visit time, chart review, lab review, care coordination, order entry, and documentation of the encounter time.  Lillard Anes, NP 03/12/23 11:14 AM Medical Oncology and Hematology [facility] [address] [place], [place] [address] Tel. [phone]    Fax. [phone]  *Total Encounter Time as defined by the Centers for Medicare and Medicaid Services includes, in addition to the face-to-face time of a patient visit (documented in the note above) non-face-to-face time: obtaining and reviewing outside history, ordering and reviewing medications, tests or procedures, care coordination (communications with other health care professionals or  caregivers) and documentation in the medical record.

## 2023-03-12 NOTE — Assessment & Plan Note (Addendum)
Steve Mills is a 57 year old man with metastatic pancreatic cancer currently on treatment with liposomal irinotecan, leucovorin, and 5-FU given every 14 days.  Metastatic pancreatic cancer: He has no signs of clinical progression today.  His labs are stable and he will proceed with treatment.  His most recent imaging occurred on July 16 and he reviewed the results with Dr. Blake Divine.  The recommendation is to repeat restaging in 2 months time.  I sent a message to scheduling for him to have more appointments scheduled for his subsequent treatments. Diarrhea: Managed with Imodium.  His kidney function and electrolytes are pending today. Peripheral neuropathy: This is stable and unchanged.  Will continue to monitor. Fever blister: Resolved. Cancer related fatigue: We discussed energy conservation in addition to small amounts of exercise to help combat the fatigue. Indigestion: Continue Prilosec and he will receive Famotidine pre-treatment today.   Jalan will return to clinic on Wednesday for pump d/c and in 2 weeks for labs, f/u and his next treatment.

## 2023-03-12 NOTE — Patient Instructions (Signed)
[facility]  Discharge Instructions: Thank you for choosing [facility] to provide your oncology and hematology care.   If you have a lab appointment with the Cancer Center, please go directly to the Cancer Center and check in at the registration area.   Wear comfortable clothing and clothing appropriate for easy access to any Portacath or PICC line.   We strive to give you quality time with your provider. You may need to reschedule your appointment if you arrive late (15 or more minutes).  Arriving late affects you and other patients whose appointments are after yours.  Also, if you miss three or more appointments without notifying the office, you may be dismissed from the clinic at the provider's discretion.      For prescription refill requests, have your pharmacy contact our office and allow 72 hours for refills to be completed.    Today you received the following chemotherapy and/or immunotherapy agents: Irinotecan Liposomal/Leucovorin/Fluorouracil      To help prevent nausea and vomiting after your treatment, we encourage you to take your nausea medication as directed.  BELOW ARE SYMPTOMS THAT SHOULD BE REPORTED IMMEDIATELY: *FEVER GREATER THAN 100.4 F (38 C) OR HIGHER *CHILLS OR SWEATING *NAUSEA AND VOMITING THAT IS NOT CONTROLLED WITH YOUR NAUSEA MEDICATION *UNUSUAL SHORTNESS OF BREATH *UNUSUAL BRUISING OR BLEEDING *URINARY PROBLEMS (pain or burning when urinating, or frequent urination) *BOWEL PROBLEMS (unusual diarrhea, constipation, pain near the anus) TENDERNESS IN MOUTH AND THROAT WITH OR WITHOUT PRESENCE OF ULCERS (sore throat, sores in mouth, or a toothache) UNUSUAL RASH, SWELLING OR PAIN  UNUSUAL VAGINAL DISCHARGE OR ITCHING   Items with * indicate a potential emergency and should be followed up as soon as possible or go to the Emergency Department if any problems should occur.  Please show the CHEMOTHERAPY ALERT CARD  or IMMUNOTHERAPY ALERT CARD at check-in to the Emergency Department and triage nurse.  Should you have questions after your visit or need to cancel or reschedule your appointment, please contact [place]  Dept: [phone]  and follow the prompts.  Office hours are 8:00 a.m. to 4:30 p.m. Monday - Friday. Please note that voicemails left after 4:00 p.m. may not be returned until the following business day.  We are closed weekends and major holidays. You have access to a nurse at all times for urgent questions. Please call the main number to the clinic Dept: [phone] and follow the prompts.   For any non-urgent questions, you may also contact your provider using MyChart. We now offer e-Visits for anyone 55 and older to request care online for non-urgent symptoms. For details visit [email or web address].   Also download the MyChart app! Go to the app store, search "MyChart", open the app, select [place], and log in with your MyChart username and password.

## 2023-03-13 LAB — CANCER ANTIGEN 19-9: CA 19-9: 45 U/mL — ABNORMAL HIGH (ref 0–35)

## 2023-03-14 ENCOUNTER — Other Ambulatory Visit: Payer: Self-pay

## 2023-03-14 ENCOUNTER — Inpatient Hospital Stay: Payer: BC Managed Care – PPO

## 2023-03-14 VITALS — BP 130/85 | HR 66 | Temp 98.5°F | Resp 18

## 2023-03-14 DIAGNOSIS — C250 Malignant neoplasm of head of pancreas: Secondary | ICD-10-CM | POA: Diagnosis not present

## 2023-03-14 DIAGNOSIS — C259 Malignant neoplasm of pancreas, unspecified: Secondary | ICD-10-CM

## 2023-03-14 DIAGNOSIS — Z452 Encounter for adjustment and management of vascular access device: Secondary | ICD-10-CM | POA: Diagnosis not present

## 2023-03-14 DIAGNOSIS — R197 Diarrhea, unspecified: Secondary | ICD-10-CM | POA: Diagnosis not present

## 2023-03-14 DIAGNOSIS — B001 Herpesviral vesicular dermatitis: Secondary | ICD-10-CM | POA: Diagnosis not present

## 2023-03-14 DIAGNOSIS — C787 Secondary malignant neoplasm of liver and intrahepatic bile duct: Secondary | ICD-10-CM | POA: Diagnosis not present

## 2023-03-14 DIAGNOSIS — G629 Polyneuropathy, unspecified: Secondary | ICD-10-CM | POA: Diagnosis not present

## 2023-03-14 DIAGNOSIS — Z5111 Encounter for antineoplastic chemotherapy: Secondary | ICD-10-CM | POA: Diagnosis not present

## 2023-03-14 MED ORDER — SODIUM CHLORIDE 0.9% FLUSH
10.0000 mL | INTRAVENOUS | Status: DC | PRN
  Administered 2023-03-14: 10 mL

## 2023-03-14 MED ORDER — HEPARIN SOD (PORK) LOCK FLUSH 100 UNIT/ML IV SOLN
500.0000 [IU] | Freq: Once | INTRAVENOUS | Status: AC | PRN
  Administered 2023-03-14: 500 [IU]

## 2023-03-26 MED FILL — Dexamethasone Sodium Phosphate Inj 100 MG/10ML: INTRAMUSCULAR | Qty: 1 | Status: AC

## 2023-03-27 ENCOUNTER — Other Ambulatory Visit: Payer: Self-pay

## 2023-03-27 ENCOUNTER — Inpatient Hospital Stay: Payer: BC Managed Care – PPO | Attending: Hematology

## 2023-03-27 ENCOUNTER — Inpatient Hospital Stay: Payer: BC Managed Care – PPO | Admitting: Hematology

## 2023-03-27 ENCOUNTER — Inpatient Hospital Stay: Payer: BC Managed Care – PPO

## 2023-03-27 VITALS — BP 113/77 | HR 76 | Temp 98.7°F | Resp 18 | Ht 72.0 in | Wt 243.3 lb

## 2023-03-27 DIAGNOSIS — C250 Malignant neoplasm of head of pancreas: Secondary | ICD-10-CM | POA: Diagnosis not present

## 2023-03-27 DIAGNOSIS — G629 Polyneuropathy, unspecified: Secondary | ICD-10-CM | POA: Insufficient documentation

## 2023-03-27 DIAGNOSIS — C787 Secondary malignant neoplasm of liver and intrahepatic bile duct: Secondary | ICD-10-CM

## 2023-03-27 DIAGNOSIS — R112 Nausea with vomiting, unspecified: Secondary | ICD-10-CM | POA: Diagnosis not present

## 2023-03-27 DIAGNOSIS — Z95828 Presence of other vascular implants and grafts: Secondary | ICD-10-CM

## 2023-03-27 DIAGNOSIS — R5383 Other fatigue: Secondary | ICD-10-CM | POA: Insufficient documentation

## 2023-03-27 DIAGNOSIS — Z452 Encounter for adjustment and management of vascular access device: Secondary | ICD-10-CM | POA: Insufficient documentation

## 2023-03-27 DIAGNOSIS — R197 Diarrhea, unspecified: Secondary | ICD-10-CM | POA: Diagnosis not present

## 2023-03-27 DIAGNOSIS — C259 Malignant neoplasm of pancreas, unspecified: Secondary | ICD-10-CM | POA: Diagnosis not present

## 2023-03-27 DIAGNOSIS — Z5111 Encounter for antineoplastic chemotherapy: Secondary | ICD-10-CM | POA: Insufficient documentation

## 2023-03-27 LAB — CMP (CANCER CENTER ONLY)
ALT: 20 U/L (ref 0–44)
AST: 16 U/L (ref 15–41)
Albumin: 3.6 g/dL (ref 3.5–5.0)
Alkaline Phosphatase: 71 U/L (ref 38–126)
Anion gap: 6 (ref 5–15)
BUN: 8 mg/dL (ref 6–20)
CO2: 27 mmol/L (ref 22–32)
Calcium: 8.6 mg/dL — ABNORMAL LOW (ref 8.9–10.3)
Chloride: 106 mmol/L (ref 98–111)
Creatinine: 0.86 mg/dL (ref 0.61–1.24)
GFR, Estimated: >60 mL/min (ref >60)
Glucose, Bld: 126 mg/dL — ABNORMAL HIGH (ref 70–99)
Potassium: 3.2 mmol/L — ABNORMAL LOW (ref 3.5–5.1)
Sodium: 139 mmol/L (ref 135–145)
Total Bilirubin: 0.5 mg/dL (ref 0.3–1.2)
Total Protein: 6.1 g/dL — ABNORMAL LOW (ref 6.5–8.1)

## 2023-03-27 LAB — CBC WITH DIFFERENTIAL (CANCER CENTER ONLY)
Abs Immature Granulocytes: 0.02 10*3/uL (ref 0.00–0.07)
Basophils Absolute: 0.1 10*3/uL (ref 0.0–0.1)
Basophils Relative: 1 %
Eosinophils Absolute: 0.6 10*3/uL — ABNORMAL HIGH (ref 0.0–0.5)
Eosinophils Relative: 8 %
HCT: 33.5 % — ABNORMAL LOW (ref 39.0–52.0)
Hemoglobin: 11.6 g/dL — ABNORMAL LOW (ref 13.0–17.0)
Immature Granulocytes: 0 %
Lymphocytes Relative: 34 %
Lymphs Abs: 2.4 10*3/uL (ref 0.7–4.0)
MCH: 30.5 pg (ref 26.0–34.0)
MCHC: 34.6 g/dL (ref 30.0–36.0)
MCV: 88.2 fL (ref 80.0–100.0)
Monocytes Absolute: 0.7 10*3/uL (ref 0.1–1.0)
Monocytes Relative: 9 %
Neutro Abs: 3.4 10*3/uL (ref 1.7–7.7)
Neutrophils Relative %: 48 %
Platelet Count: 297 10*3/uL (ref 150–400)
RBC: 3.8 MIL/uL — ABNORMAL LOW (ref 4.22–5.81)
RDW: 15.4 % (ref 11.5–15.5)
WBC Count: 7.2 10*3/uL (ref 4.0–10.5)
nRBC: 0 % (ref 0.0–0.2)

## 2023-03-27 MED ORDER — SODIUM CHLORIDE 0.9% FLUSH
10.0000 mL | Freq: Once | INTRAVENOUS | Status: AC
  Administered 2023-03-27: 10 mL

## 2023-03-27 MED ORDER — ATROPINE SULFATE 1 MG/ML IV SOLN
0.5000 mg | Freq: Once | INTRAVENOUS | Status: AC | PRN
  Administered 2023-03-27: 0.5 mg via INTRAVENOUS
  Filled 2023-03-27: qty 1

## 2023-03-27 MED ORDER — SODIUM CHLORIDE 0.9% FLUSH: 10.0000 mL | INTRAVENOUS | Status: DC | PRN

## 2023-03-27 MED ORDER — SODIUM CHLORIDE 0.9 % IV SOLN
400.0000 mg/m2 | Freq: Once | INTRAVENOUS | Status: AC
  Administered 2023-03-27: 980 mg via INTRAVENOUS
  Filled 2023-03-27: qty 49

## 2023-03-27 MED ORDER — PALONOSETRON HCL INJECTION 0.25 MG/5ML
0.2500 mg | Freq: Once | INTRAVENOUS | Status: AC
  Administered 2023-03-27: 0.25 mg via INTRAVENOUS
  Filled 2023-03-27: qty 5

## 2023-03-27 MED ORDER — SODIUM CHLORIDE 0.9 % IV SOLN
10.0000 mg | Freq: Once | INTRAVENOUS | Status: AC
  Administered 2023-03-27: 10 mg via INTRAVENOUS
  Filled 2023-03-27: qty 10

## 2023-03-27 MED ORDER — SODIUM CHLORIDE 0.9 % IV SOLN
60.0000 mg/m2 | Freq: Once | INTRAVENOUS | Status: AC
  Administered 2023-03-27: 146.2 mg via INTRAVENOUS
  Filled 2023-03-27: qty 34

## 2023-03-27 MED ORDER — SODIUM CHLORIDE 0.9 % IV SOLN
2400.0000 mg/m2 | INTRAVENOUS | Status: DC
  Administered 2023-03-27: 5900 mg via INTRAVENOUS
  Filled 2023-03-27: qty 118

## 2023-03-27 MED ORDER — SODIUM CHLORIDE 0.9 % IV SOLN: Freq: Once | INTRAVENOUS | Status: AC

## 2023-03-27 MED ORDER — HEPARIN SOD (PORK) LOCK FLUSH 100 UNIT/ML IV SOLN: 500.0000 [IU] | Freq: Once | INTRAVENOUS | Status: DC | PRN

## 2023-03-27 NOTE — Progress Notes (Signed)
[facility]   [phone] [phone]) [phone]   Clinic Follow up Note   Patient Care Team: [name], MD as PCP - General (Family Medicine) [name], MD as Consulting Physician (Oncology) Tito Dine., MD as Consulting Physician (General Surgery)  Date of Service:  03/27/2023  CHIEF COMPLAINT: f/u of Metastatic Pancreatic Cancer  CURRENT THERAPY:  :Liposomal irinotecan, Leucovorin, 5-FU every 14 days   ASSESSMENT:  Steve Mills is a 57 y.o. male with   Pancreatic cancer metastasized to liver [facility]) metastatic to liver, stage IV, KRAS G12R (+), MMR normal  -incidental finding on lung cancer screening chest CT on 01/19/22. 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  -He began first-line palliative FOLFIRINOX on 03/13/22, but unfortunately did not respond well. CA 19-9 also rose on treatment. -he switched to second line gemcitabine/abraxane on 06/05/22. He tolerates moderate well and has continued to work full time. CA 19-9 has dropped on treatment. -restaging CT from 08/24/22 showed partial response to treatment, improved primary tumor, and liver/node mets, I discussed with him  -Pt was referred to see Dr. [name] on 10/10/2022 , surgery was discussed but not felt to be a surgical candidate due to his multiple liver metastasis (5) -Restaging CT scan on November 24, 2022 showed moderate disease progression in liver and the pancreas, his tumor marker also increased.  I have changed his chemotherapy to 5-FU and liposomal irinotecan, started on 12/04/22, he is tolerating well  -Restaging scan from March 06, 2023 showed slightly decreased pancreatic mass, but he has slight progression in liver metastasis.  Has been trending up lately, also indicating a mild progression. -We discussed there is very limited treatment option at this point, I would recommend him  to try docetaxel, gemcitabine and capecitabine as next line therapy. -pt is clinically doing well, given the limited treatment options, we decided to continue current therapy until he has further disease progression.     PLAN: -reviewed CT scan w/ pt. - Tumor Marker- trending up -discuss 3rd line -Gemcitabine , Docetaxel and Xeloda -Guardant 360 next lab drawn -proceeded with treatment today -lab/flush and treatment 8/19  SUMMARY OF ONCOLOGIC HISTORY: Oncology History Overview Note   Cancer Staging  Pancreatic cancer metastasized to liver [facility]) Staging form: Exocrine Pancreas, AJCC 8th Edition - Clinical stage from 02/28/2022: Stage IV (cT2, cN1, pM1) - Signed by [name], MD on 03/02/2022 Stage prefix: Initial diagnosis     Pancreatic cancer metastasized to liver (HCC)  02/28/2022 Cancer Staging   Staging form: Exocrine Pancreas, AJCC 8th Edition - Clinical stage from 02/28/2022: Stage IV (cT2, cN1, pM1) - Signed by [name], MD on 03/02/2022 Stage prefix: Initial diagnosis   03/02/2022 Initial Diagnosis   Pancreatic cancer metastasized to liver (HCC)   03/13/2022 - 04/13/2022 Chemotherapy   Patient is on Treatment Plan : PANCREAS Modified FOLFIRINOX q14d x 4 cycles     03/13/2022 - 05/17/2022 Chemotherapy   Patient is on Treatment Plan : PANCREAS Modified FOLFIRINOX q14d x 4 cycles     03/18/2022 Genetic Testing   Negative genetic testing on the CancerNext-Expanded+RNAinsight panel.  APC [id number] VUs identified.  The report date is March 18, 2022.  The CancerNext-Expanded gene panel offered by [facility] and includes sequencing and rearrangement analysis for the following 77 genes: AIP, ALK, APC*, ATM*, AXIN2, BAP1, BARD1, BLM, BMPR1A, BRCA1*, BRCA2*, BRIP1*, CDC73, CDH1*, CDK4,  CDKN1B, CDKN2A, CHEK2*, CTNNA1, DICER1, FANCC, FH, FLCN, GALNT12, KIF1B, LZTR1, MAX, MEN1, MET, MLH1*, MSH2*, MSH3, MSH6*, MUTYH*, NBN, NF1*, NF2, NTHL1, PALB2*, PHOX2B, PMS2*, POT1, PRKAR1A, PTCH1,  PTEN*, RAD51C*, RAD51D*, RB1, RECQL, RET, SDHA, SDHAF2, SDHB, SDHC, SDHD, SMAD4, SMARCA4, SMARCB1, SMARCE1, STK11, SUFU, TMEM127, TP53*, TSC1, TSC2, VHL and XRCC2 (sequencing and deletion/duplication); EGFR, EGLN1, HOXB13, KIT, MITF, PDGFRA, POLD1, and POLE (sequencing only); EPCAM and GREM1 (deletion/duplication only). DNA and RNA analyses performed for * genes.    05/25/2022 Progression   Rising CA 19-9, CT CAP IMPRESSION: 1. Multiple new and enlarged hypodense, rim enhancing liver metastases. 2. Unchanged, ill-defined hypoenhancing mass of the pancreatic uncinate, consistent with pancreatic adenocarcinoma 3. Unchanged small prominent portacaval and gastrohepatic ligament nodes, modestly suspicious for nodal metastases. No overtly enlarged lymph nodes in the abdomen or pelvis. 4. Prostatomegaly.    06/05/2022 - 11/13/2022 Chemotherapy   Patient is on Treatment Plan : PANCREATIC Abraxane D1,8,15 + Gemcitabine D1,8,15 q28d     11/24/2022 Imaging   IMPRESSION: 1. Today's study demonstrates mild progression of disease as evidenced by slight enlargement of the mass in the uncinate process of the pancreas which demonstrates potential direct invasion into the superior wall of the third portion of the duodenum, but currently demonstrates no definite vascular involvement. Multiple hepatic lesions are generally stable to the prior study, with exception of enlargement of a lesion in segment 8, as detailed above. 2. No new sites of metastatic disease are noted elsewhere in the chest or pelvis. 3. Aortic atherosclerosis.  Rising CA 19-9    12/04/2022 -  Chemotherapy   Patient is on Treatment Plan : PANCREAS Liposomal Irinotecan + Leucovorin + 5-FU IVCI q14d     03/06/2023 Imaging    IMPRESSION: 1. Mass in the pancreatic head demonstrates decreased size since prior study. 2. Multiple liver metastasis are mildly increased in size. No new lesions. 3. 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 Metastatic disease would be less likely. 4. Mild aortic atherosclerosis. 5. Nonspecific sclerotic foci in the pelvis, unchanged, likely benign bone islands.        INTERVAL HISTORY:  CHEVEZ RIVEST is here for a follow up of  Metastatic Pancreatic Cancer. He was last seen by NP Mardella Layman on 03/12/2023. He presents to the clinic accompanied by wife. Pt state that he had nausea and vomiting. After treatment, but he recovers. He also report of having Diarrhea and he take imodium and lomotil. Pt state that his appetite is fine , he just cant eat spicy food and some tomato base foods.     All other systems were reviewed with the patient and are negative.  MEDICAL HISTORY:  Past Medical History:  Diagnosis Date   Family history of adverse reaction to anesthesia    mother allergic to ether    Family history of breast cancer    Family history of colon cancer    Pneumonia    hx of 2014     SURGICAL HISTORY: Past Surgical History:  Procedure Laterality Date   APPENDECTOMY     CHOLECYSTECTOMY N/A 07/12/2018   Procedure: LAPAROSCOPIC CHOLECYSTECTOMY WITH INTRAOPERATIVE CHOLANGIOGRAM;  Surgeon: Luretha Murphy, MD;  Location: [facility];  Service: General;  Laterality: N/A;   left wrist surgery      has plate in arm    NASAL POLYP SURGERY  2017   PORTACATH PLACEMENT N/A 03/10/2022   Procedure:  INSERTION PORT-A-CATH;  Surgeon: Fritzi Mandes, MD;  Location: [facility] OR;  Service: General;  Laterality: N/A;   VENTRAL HERNIA REPAIR N/A 07/15/2014   Procedure: LAPAROSCOPIC REPAIR INCARCARTED UMBILICAL  VENTRAL HERNIA, ;  Surgeon: Claud Kelp, MD;  Location: [facility];  Service: General;  Laterality: N/A;  With MESH    I have reviewed the social history and family history with the patient and they are unchanged from previous note.  ALLERGIES:  has No Known  Allergies.  MEDICATIONS:  Current Outpatient Medications  Medication Sig Dispense Refill   KLOR-CON M20 20 MEQ tablet TAKE 1 TABLET BY MOUTH 2 TIMES A DAY FOR 5 DAYS THEN TAKE 1 TABLET DAILY (Patient not taking: Reported on 02/21/2023) 35 tablet 0   lidocaine-prilocaine (EMLA) cream Apply to affected area once 30 g 3   loperamide (IMODIUM A-D) 2 MG tablet Take 2 mg by mouth 4 (four) times daily as needed for diarrhea or loose stools.     ondansetron (ZOFRAN) 8 MG tablet Take 1 tablet (8 mg total) by mouth every 8 (eight) hours as needed for nausea or vomiting. Start on the third day after irinotecan (Patient not taking: Reported on 02/21/2023) 30 tablet 1   prochlorperazine (COMPAZINE) 10 MG tablet Take 1 tablet (10 mg total) by mouth every 6 (six) hours as needed for nausea or vomiting. (Patient not taking: Reported on 02/21/2023) 30 tablet 1   valACYclovir (VALTREX) 500 MG tablet Take 1 tablet (500 mg total) by mouth 2 (two) times daily. 60 tablet 3   No current facility-administered medications for this visit.   Facility-Administered Medications Ordered in Other Visits  Medication Dose Route Frequency Provider Last Rate Last Admin   fluorouracil (ADRUCIL) 5,900 mg in sodium chloride 0.9 % 132 mL chemo infusion  2,400 mg/m2 (Treatment Plan Recorded) Intravenous 1 day or 1 dose [name], MD   Infusion Verify at 03/27/23 1752   heparin lock flush 100 unit/mL  500 Units Intracatheter Once PRN [name], MD       sodium chloride flush (NS) 0.9 % injection 10 mL  10 mL Intracatheter PRN [name], MD        PHYSICAL EXAMINATION: ECOG PERFORMANCE STATUS: 1 - Symptomatic but completely ambulatory  Vitals:   03/27/23 1200  BP: 113/77  Pulse: 76  Resp: 18  Temp: 98.7 F (37.1 C)  SpO2: 97%   Wt Readings from Last 3 Encounters:  03/27/23 243 lb 4.8 oz (110.4 kg)  03/12/23 245 lb 8 oz (111.4 kg)  02/21/23 246 lb 9.6 oz (111.9 kg)     GENERAL:alert, no distress and comfortable SKIN: skin  color normal, no rashes or significant lesions EYES: normal, Conjunctiva are pink and non-injected, sclera clear  NEURO: alert & oriented x 3 with fluent speech  LABORATORY DATA:  I have reviewed the data as listed    Latest Ref Rng & Units 03/27/2023   11:38 AM 03/12/2023   10:17 AM 02/21/2023   10:09 AM  CBC  WBC 4.0 - 10.5 K/uL 7.2  6.8  5.8   Hemoglobin 13.0 - 17.0 g/dL 82.9  56.2  13.0   Hematocrit 39.0 - 52.0 % 33.5  35.1  35.8   Platelets 150 - 400 K/uL 297  289  290         Latest Ref Rng & Units 03/27/2023   11:38 AM 03/12/2023   10:17 AM 02/21/2023   10:09 AM  CMP  Glucose 70 - 99 mg/dL 865  129  107   BUN 6 - 20 mg/dL 8  8  7    Creatinine 0.61 - 1.24 mg/dL 6.60  6.30  1.60   Sodium 135 - 145 mmol/L 139  141  141   Potassium 3.5 - 5.1 mmol/L 3.2  3.4  3.2   Chloride 98 - 111 mmol/L 106  106  106   CO2 22 - 32 mmol/L 27  27  26    Calcium 8.9 - 10.3 mg/dL 8.6  8.9  9.1   Total Protein 6.5 - 8.1 g/dL 6.1  5.9  5.5   Total Bilirubin 0.3 - 1.2 mg/dL 0.5  0.5  0.6   Alkaline Phos 38 - 126 U/L 71  75  65   AST 15 - 41 U/L 16  20  16    ALT 0 - 44 U/L 20  22  19        RADIOGRAPHIC STUDIES: I have personally reviewed the radiological images as listed and agreed with the findings in the report. No results found.    Orders Placed This Encounter  Procedures   CBC with Differential (Cancer Center Only)    Standing Status:   Future    Standing Expiration Date:   04/23/2024   CMP (Cancer Center only)    Standing Status:   Future    Standing Expiration Date:   04/23/2024   CBC with Differential (Cancer Center Only)    Standing Status:   Future    Standing Expiration Date:   05/07/2024   CMP (Cancer Center only)    Standing Status:   Future    Standing Expiration Date:   05/07/2024   Guardant 360    Standing Status:   Future    Standing Expiration Date:   03/26/2024   All questions were answered. The patient knows to call the clinic with any problems, questions or concerns. No  barriers to learning was detected. The total time spent in the appointment was 30 minutes.     [name], MD 03/27/2023   Carolin Coy, CMA, am acting as scribe for [name], MD.   I have reviewed the above documentation for accuracy and completeness, and I agree with the above.

## 2023-03-27 NOTE — Assessment & Plan Note (Addendum)
metastatic to liver, stage IV, KRAS G12R (+), MMR normal  -incidental finding on lung cancer screening chest CT on 01/19/22. 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  -He began first-line palliative FOLFIRINOX on 03/13/22, but unfortunately did not respond well. CA 19-9 also rose on treatment. -he switched to second line gemcitabine/abraxane on 06/05/22. He tolerates moderate well and has continued to work full time. CA 19-9 has dropped on treatment. -restaging CT from 08/24/22 showed partial response to treatment, improved primary tumor, and liver/node mets, I discussed with him  -Pt was referred to see Dr. [name] on 10/10/2022 , surgery was discussed but not felt to be a surgical candidate due to his multiple liver metastasis (5) -Restaging CT scan on November 24, 2022 showed moderate disease progression in liver and the pancreas, his tumor marker also increased.  I have changed his chemotherapy to 5-FU and liposomal irinotecan, started on 12/04/22, he is tolerating well  -Restaging scan from March 06, 2023 showed slightly decreased pancreatic mass, but he has slight progression in liver metastasis.  Has been trending up lately, also indicating a mild progression. -We discussed there is very limited treatment option at this point, I would recommend him to try docetaxel, gemcitabine and capecitabine as next line therapy. -pt is clinically doing well, given the limited treatment options, we decided to continue current therapy until he has further disease progression.

## 2023-03-29 ENCOUNTER — Other Ambulatory Visit: Payer: Self-pay

## 2023-03-29 ENCOUNTER — Inpatient Hospital Stay: Payer: BC Managed Care – PPO

## 2023-03-29 DIAGNOSIS — R112 Nausea with vomiting, unspecified: Secondary | ICD-10-CM | POA: Diagnosis not present

## 2023-03-29 DIAGNOSIS — G629 Polyneuropathy, unspecified: Secondary | ICD-10-CM | POA: Diagnosis not present

## 2023-03-29 DIAGNOSIS — C259 Malignant neoplasm of pancreas, unspecified: Secondary | ICD-10-CM

## 2023-03-29 DIAGNOSIS — Z5111 Encounter for antineoplastic chemotherapy: Secondary | ICD-10-CM | POA: Diagnosis not present

## 2023-03-29 DIAGNOSIS — Z452 Encounter for adjustment and management of vascular access device: Secondary | ICD-10-CM | POA: Diagnosis not present

## 2023-03-29 DIAGNOSIS — R5383 Other fatigue: Secondary | ICD-10-CM | POA: Diagnosis not present

## 2023-03-29 DIAGNOSIS — C787 Secondary malignant neoplasm of liver and intrahepatic bile duct: Secondary | ICD-10-CM | POA: Diagnosis not present

## 2023-03-29 DIAGNOSIS — C250 Malignant neoplasm of head of pancreas: Secondary | ICD-10-CM | POA: Diagnosis not present

## 2023-03-29 DIAGNOSIS — R197 Diarrhea, unspecified: Secondary | ICD-10-CM | POA: Diagnosis not present

## 2023-03-29 MED ORDER — HEPARIN SOD (PORK) LOCK FLUSH 100 UNIT/ML IV SOLN
500.0000 [IU] | Freq: Once | INTRAVENOUS | Status: AC | PRN
  Administered 2023-03-29: 500 [IU]

## 2023-03-29 MED ORDER — SODIUM CHLORIDE 0.9% FLUSH
10.0000 mL | INTRAVENOUS | Status: DC | PRN
  Administered 2023-03-29: 10 mL

## 2023-03-30 ENCOUNTER — Telehealth: Payer: Self-pay | Admitting: Hematology

## 2023-03-31 ENCOUNTER — Other Ambulatory Visit: Payer: Self-pay

## 2023-04-05 ENCOUNTER — Other Ambulatory Visit: Payer: Self-pay

## 2023-04-05 DIAGNOSIS — C787 Secondary malignant neoplasm of liver and intrahepatic bile duct: Secondary | ICD-10-CM | POA: Diagnosis not present

## 2023-04-05 DIAGNOSIS — C259 Malignant neoplasm of pancreas, unspecified: Secondary | ICD-10-CM | POA: Diagnosis not present

## 2023-04-09 ENCOUNTER — Ambulatory Visit: Payer: BC Managed Care – PPO

## 2023-04-09 ENCOUNTER — Other Ambulatory Visit: Payer: BC Managed Care – PPO

## 2023-04-09 ENCOUNTER — Ambulatory Visit: Payer: BC Managed Care – PPO | Admitting: Nurse Practitioner

## 2023-04-09 MED FILL — Dexamethasone Sodium Phosphate Inj 100 MG/10ML: INTRAMUSCULAR | Qty: 1 | Status: AC

## 2023-04-09 NOTE — Progress Notes (Unsigned)
Patient Care Team: Steve Floro, MD as PCP - General (Family Medicine) Steve Mood, MD as Consulting Physician (Oncology) Steve Mills, Steve Standard., MD as Consulting Physician (General Surgery)   CHIEF COMPLAINT: Follow up metastatic pancreatic cancer   Oncology History Overview Note   Cancer Staging  Pancreatic cancer metastasized to liver [facility]) Staging form: Exocrine Pancreas, AJCC 8th Edition - Clinical stage from 02/28/2022: Stage IV (cT2, cN1, pM1) - Signed by Steve Mood, MD on 03/02/2022 Stage prefix: Initial diagnosis     Pancreatic cancer metastasized to liver (HCC)  02/28/2022 Cancer Staging   Staging form: Exocrine Pancreas, AJCC 8th Edition - Clinical stage from 02/28/2022: Stage IV (cT2, cN1, pM1) - Signed by Steve Mood, MD on 03/02/2022 Stage prefix: Initial diagnosis   03/02/2022 Initial Diagnosis   Pancreatic cancer metastasized to liver (HCC)   03/13/2022 - 04/13/2022 Chemotherapy   Patient is on Treatment Plan : PANCREAS Modified FOLFIRINOX q14d x 4 cycles     03/13/2022 - 05/17/2022 Chemotherapy   Patient is on Treatment Plan : PANCREAS Modified FOLFIRINOX q14d x 4 cycles     03/18/2022 Genetic Testing   Negative genetic testing on the CancerNext-Expanded+RNAinsight panel.  APC [id number] VUs identified.  The report date is March 18, 2022.  The CancerNext-Expanded gene panel offered by [facility] and includes sequencing and rearrangement analysis for the following 77 genes: AIP, ALK, APC*, ATM*, AXIN2, BAP1, BARD1, BLM, BMPR1A, BRCA1*, BRCA2*, BRIP1*, CDC73, CDH1*, CDK4, CDKN1B, CDKN2A, CHEK2*, CTNNA1, DICER1, FANCC, FH, FLCN, GALNT12, KIF1B, LZTR1, MAX, MEN1, MET, MLH1*, MSH2*, MSH3, MSH6*, MUTYH*, NBN, NF1*, NF2, NTHL1, PALB2*, PHOX2B, PMS2*, POT1, PRKAR1A, PTCH1, PTEN*, RAD51C*, RAD51D*, RB1, RECQL, RET, SDHA, SDHAF2, SDHB, SDHC, SDHD, SMAD4, SMARCA4, SMARCB1, SMARCE1, STK11, SUFU, TMEM127, TP53*, TSC1, TSC2, VHL and XRCC2 (sequencing and deletion/duplication); EGFR,  EGLN1, HOXB13, KIT, MITF, PDGFRA, POLD1, and POLE (sequencing only); EPCAM and GREM1 (deletion/duplication only). DNA and RNA analyses performed for * genes.    05/25/2022 Progression   Rising CA 19-9, CT CAP IMPRESSION: 1. Multiple new and enlarged hypodense, rim enhancing liver metastases. 2. Unchanged, ill-defined hypoenhancing mass of the pancreatic uncinate, consistent with pancreatic adenocarcinoma 3. Unchanged small prominent portacaval and gastrohepatic ligament nodes, modestly suspicious for nodal metastases. No overtly enlarged lymph nodes in the abdomen or pelvis. 4. Prostatomegaly.    06/05/2022 - 11/13/2022 Chemotherapy   Patient is on Treatment Plan : PANCREATIC Abraxane D1,8,15 + Gemcitabine D1,8,15 q28d     11/24/2022 Imaging   IMPRESSION: 1. Today's study demonstrates mild progression of disease as evidenced by slight enlargement of the mass in the uncinate process of the pancreas which demonstrates potential direct invasion into the superior wall of the third portion of the duodenum, but currently demonstrates no definite vascular involvement. Multiple hepatic lesions are generally stable to the prior study, with exception of enlargement of a lesion in segment 8, as detailed above. 2. No new sites of metastatic disease are noted elsewhere in the chest or pelvis. 3. Aortic atherosclerosis.  Rising CA 19-9    12/04/2022 -  Chemotherapy   Patient is on Treatment Plan : PANCREAS Liposomal Irinotecan + Leucovorin + 5-FU IVCI q14d     03/06/2023 Imaging    IMPRESSION: 1. Mass in the pancreatic head demonstrates decreased size since prior study. 2. Multiple liver metastasis are mildly increased in size. No new lesions. 3. 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  Metastatic disease would be less likely. 4. Mild aortic atherosclerosis. 5.  Nonspecific sclerotic foci in the pelvis, unchanged, likely benign bone islands.        CURRENT THERAPY: Liposomal irinotecan, leucovorin, 5FU q14 days; starting 12/04/22  INTERVAL HISTORY [name] returns for follow up as scheduled. Last seen by Dr. Mosetta Mills 03/27/23 and completed another cycle of lipo irinotecan/5FU. Tolerating about the same. He has diarrhea from around day 4 for 5-7 days, managed with imodium. He is eating bland foods, intentionally small amounts due to pain/bloating and gas if he eats too much. During the week after treatment he may vomit 2-3 times but not nauseous prior, more like reflux and throws up bile. Feels better after. Takes prilosec periodically. Notes his stools appear greasy. Notes his fatigue is more intense but still mobile/active.   ROS  All other systems reviewed and negative  Past Medical History:  Diagnosis Date   Family history of adverse reaction to anesthesia    mother allergic to ether    Family history of breast cancer    Family history of colon cancer    Pneumonia    hx of 2014      Past Surgical History:  Procedure Laterality Date   APPENDECTOMY     CHOLECYSTECTOMY N/A 07/12/2018   Procedure: LAPAROSCOPIC CHOLECYSTECTOMY WITH INTRAOPERATIVE CHOLANGIOGRAM;  Surgeon: Luretha Murphy, MD;  Location: [facility];  Service: General;  Laterality: N/A;   left wrist surgery      has plate in arm    NASAL POLYP SURGERY  2017   PORTACATH PLACEMENT N/A 03/10/2022   Procedure: INSERTION PORT-A-CATH;  Surgeon: Fritzi Mandes, MD;  Location: [facility] OR;  Service: General;  Laterality: N/A;   VENTRAL HERNIA REPAIR N/A 07/15/2014   Procedure: LAPAROSCOPIC REPAIR INCARCARTED UMBILICAL  VENTRAL HERNIA, ;  Surgeon: Claud Kelp, MD;  Location: [facility];  Service: General;  Laterality: N/A;  With MESH     Outpatient Encounter Medications as of 04/10/2023  Medication Sig   KLOR-CON M20 20 MEQ tablet TAKE 1 TABLET BY MOUTH 2 TIMES A DAY FOR 5 DAYS THEN TAKE 1 TABLET  DAILY   lidocaine-prilocaine (EMLA) cream Apply to affected area once   lipase/protease/amylase (CREON) 36000 UNITS CPEP capsule Take 2 capsules (72,000 Units total) by mouth 3 (three) times daily with meals. May also take 1 capsule (36,000 Units total) as needed (with snacks).   loperamide (IMODIUM A-D) 2 MG tablet Take 2 mg by mouth 4 (four) times daily as needed for diarrhea or loose stools.   pantoprazole (PROTONIX) 40 MG tablet Take 1 tablet (40 mg total) by mouth daily.   valACYclovir (VALTREX) 500 MG tablet Take 1 tablet (500 mg total) by mouth 2 (two) times daily.   ondansetron (ZOFRAN) 8 MG tablet Take 1 tablet (8 mg total) by mouth every 8 (eight) hours as needed for nausea or vomiting. Start on the third day after irinotecan (Patient not taking: Reported on 04/10/2023)   prochlorperazine (COMPAZINE) 10 MG tablet Take 1 tablet (10 mg total) by mouth every 6 (six) hours as needed for nausea or vomiting. (Patient not taking: Reported on 04/10/2023)   No facility-administered encounter medications on file as of 04/10/2023.     Today's Vitals   04/10/23 0827  BP: 120/76  Pulse: 77  Resp: 18  Temp: 98.4 F (36.9 C)  TempSrc: Oral  SpO2: 97%  Weight: 237 lb 12.8 oz (107.9 kg)  PainSc: 0-No pain   Body mass index is 32.25  kg/m.   PHYSICAL EXAM GENERAL:alert, no distress and comfortable SKIN: no rash  EYES: sclera clear NECK: without mass LYMPH:  no palpable cervical or supraclavicular lymphadenopathy  LUNGS: clear with normal breathing effort HEART: regular rate & rhythm, no lower extremity edema ABDOMEN: abdomen soft, non-tender and normal bowel sounds NEURO: alert & oriented x 3 with fluent speech, no focal motor/sensory deficits PAC without erythema    CBC    Component Value Date/Time   WBC 6.8 04/10/2023 0801   WBC 10.4 08/14/2022 0019   RBC 3.87 (L) 04/10/2023 0801   HGB 12.0 (L) 04/10/2023 0801   HCT 35.0 (L) 04/10/2023 0801   PLT 267 04/10/2023 0801   MCV 90.4  04/10/2023 0801   MCH 31.0 04/10/2023 0801   MCHC 34.3 04/10/2023 0801   RDW 15.3 04/10/2023 0801   LYMPHSABS 2.1 04/10/2023 0801   MONOABS 0.9 04/10/2023 0801   EOSABS 0.5 04/10/2023 0801   BASOSABS 0.1 04/10/2023 0801     CMP     Component Value Date/Time   NA 141 04/10/2023 0801   K 3.9 04/10/2023 0801   CL 107 04/10/2023 0801   CO2 27 04/10/2023 0801   GLUCOSE 116 (H) 04/10/2023 0801   BUN 5 (L) 04/10/2023 0801   CREATININE 0.83 04/10/2023 0801   CALCIUM 8.9 04/10/2023 0801   PROT 6.3 (L) 04/10/2023 0801   ALBUMIN 3.6 04/10/2023 0801   AST 15 04/10/2023 0801   ALT 19 04/10/2023 0801   ALKPHOS 74 04/10/2023 0801   BILITOT 0.7 04/10/2023 0801   GFRNONAA >60 04/10/2023 0801   GFRAA >60 07/12/2018 0448     ASSESSMENT & PLAN:Steve Mills is a 57 y.o. male with    1. Pancreatic Head Adenocarcinoma, metastatic to liver, stage IV, MMR normal  -incidental finding on lung cancer screening chest CT on 01/19/22. 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  -liver biopsy on 02/28/22 confirmed poorly differentiated adenocarcinoma, compatible with clinical suspicion of pancreatic adenocarcinoma. MMR normal, not a candidate for immunotherapy.  -FO showed KRAS mutation, and MSS, no immunotherapy or targeted therapy available. BRCA1/2 was negative, not a candidate for PARP inhibitor  -He progressed after 5 cycles first-line palliative FOLFIRINOX 03/13/22 - 05/2022, and after 8 cycles second line gem/abraxane 05/2022 - 11/27/2022  -He met with Dr. Kathrynn Mills at [facility], not a surgical candidate due to metastatic disease in the liver (at least 5 lesions) -Began third line FOLFIRI with liposomal irinotecan q14 days starting 12/04/22 -He had prolonged diarrhea with C2; tolerating reduced dose -CA 19-9 normalized after cycle 2 but then trended up -Restaging 03/06/23 showed slightly decreased  pancreatic mass, but slightly increased liver mets, and rising CA 19-9 indicating progression -due to limited treatment options and that he is feeling well clinically, continuing the current regimen until further progression  -Mr. Melkonian appears stable.  He continues liposomal irinotecan/5-FU, tolerating moderately well with fatigue, stable neuropathy, diarrhea, and vomiting.  -Side effects are partially managed with supportive care at home.  Will add Protonix and Creon for vomiting and greasy stools. -He is having more toxicities/longer to recover but still able to recover and function well with adequate performance status.  Encouraged him to maintain his weight -There is no clinical evidence of disease progression -After lengthy discussion about how he is feeling, quality of life, GOC etc he prefers to skip this cycle due to upcoming wedding anniversary and  family trip 8/29 -Labs reviewed, CA 19-9 pending. Will f/up Guardant360 drawn today -Hold treatment today  -Follow-up and next cycle in 2 weeks.    2. Genetics -he has a strong family history of cancer, specifically liver and lung on the paternal side and breast and colon on the maternal side. -testing performed 03/08/22, results are negative.   3.  Social -He has strong faith that he will be cancer -Wife is very supportive, accompanies him to visits -His work and church family in the community are also very supportive -Denied chaplain needs        PLAN: -Labs reviewed, CA 19-9 pending -Guardant360 drawn today -Pt prefers to hold treatment due to upcoming anniversary/family trip 8/29 -Reviewed symptom management -Rx: PPI and creon -Follow-up and next cycle in 2 weeks   All questions were answered. The patient knows to call the clinic with any problems, questions or concerns. No barriers to learning were detected. I spent 20 minutes counseling the patient face to face. The total time spent in the appointment was 30 minutes and  more than 50% was on counseling, review of test results, and coordination of care.   Santiago Glad, NP-C 04/10/2023

## 2023-04-10 ENCOUNTER — Inpatient Hospital Stay: Payer: BC Managed Care – PPO

## 2023-04-10 ENCOUNTER — Other Ambulatory Visit: Payer: Self-pay

## 2023-04-10 ENCOUNTER — Encounter: Payer: Self-pay | Admitting: Nurse Practitioner

## 2023-04-10 ENCOUNTER — Telehealth: Payer: Self-pay

## 2023-04-10 ENCOUNTER — Inpatient Hospital Stay: Payer: BC Managed Care – PPO | Admitting: Nurse Practitioner

## 2023-04-10 ENCOUNTER — Encounter: Payer: Self-pay | Admitting: *Deleted

## 2023-04-10 DIAGNOSIS — Z452 Encounter for adjustment and management of vascular access device: Secondary | ICD-10-CM | POA: Diagnosis not present

## 2023-04-10 DIAGNOSIS — Z5111 Encounter for antineoplastic chemotherapy: Secondary | ICD-10-CM | POA: Diagnosis not present

## 2023-04-10 DIAGNOSIS — C787 Secondary malignant neoplasm of liver and intrahepatic bile duct: Secondary | ICD-10-CM

## 2023-04-10 DIAGNOSIS — R5383 Other fatigue: Secondary | ICD-10-CM | POA: Diagnosis not present

## 2023-04-10 DIAGNOSIS — C259 Malignant neoplasm of pancreas, unspecified: Secondary | ICD-10-CM

## 2023-04-10 DIAGNOSIS — G629 Polyneuropathy, unspecified: Secondary | ICD-10-CM | POA: Diagnosis not present

## 2023-04-10 DIAGNOSIS — Z95828 Presence of other vascular implants and grafts: Secondary | ICD-10-CM

## 2023-04-10 DIAGNOSIS — C250 Malignant neoplasm of head of pancreas: Secondary | ICD-10-CM | POA: Diagnosis not present

## 2023-04-10 DIAGNOSIS — R197 Diarrhea, unspecified: Secondary | ICD-10-CM | POA: Diagnosis not present

## 2023-04-10 DIAGNOSIS — R112 Nausea with vomiting, unspecified: Secondary | ICD-10-CM | POA: Diagnosis not present

## 2023-04-10 LAB — CBC WITH DIFFERENTIAL (CANCER CENTER ONLY)
Abs Immature Granulocytes: 0.01 10*3/uL (ref 0.00–0.07)
Basophils Absolute: 0.1 10*3/uL (ref 0.0–0.1)
Basophils Relative: 1 %
Eosinophils Absolute: 0.5 10*3/uL (ref 0.0–0.5)
Eosinophils Relative: 7 %
HCT: 35 % — ABNORMAL LOW (ref 39.0–52.0)
Hemoglobin: 12 g/dL — ABNORMAL LOW (ref 13.0–17.0)
Immature Granulocytes: 0 %
Lymphocytes Relative: 30 %
Lymphs Abs: 2.1 10*3/uL (ref 0.7–4.0)
MCH: 31 pg (ref 26.0–34.0)
MCHC: 34.3 g/dL (ref 30.0–36.0)
MCV: 90.4 fL (ref 80.0–100.0)
Monocytes Absolute: 0.9 10*3/uL (ref 0.1–1.0)
Monocytes Relative: 13 %
Neutro Abs: 3.4 10*3/uL (ref 1.7–7.7)
Neutrophils Relative %: 49 %
Platelet Count: 267 10*3/uL (ref 150–400)
RBC: 3.87 MIL/uL — ABNORMAL LOW (ref 4.22–5.81)
RDW: 15.3 % (ref 11.5–15.5)
WBC Count: 6.8 10*3/uL (ref 4.0–10.5)
nRBC: 0 % (ref 0.0–0.2)

## 2023-04-10 LAB — CMP (CANCER CENTER ONLY)
ALT: 19 U/L (ref 0–44)
AST: 15 U/L (ref 15–41)
Albumin: 3.6 g/dL (ref 3.5–5.0)
Alkaline Phosphatase: 74 U/L (ref 38–126)
Anion gap: 7 (ref 5–15)
BUN: 5 mg/dL — ABNORMAL LOW (ref 6–20)
CO2: 27 mmol/L (ref 22–32)
Calcium: 8.9 mg/dL (ref 8.9–10.3)
Chloride: 107 mmol/L (ref 98–111)
Creatinine: 0.83 mg/dL (ref 0.61–1.24)
GFR, Estimated: >60 mL/min (ref >60)
Glucose, Bld: 116 mg/dL — ABNORMAL HIGH (ref 70–99)
Potassium: 3.9 mmol/L (ref 3.5–5.1)
Sodium: 141 mmol/L (ref 135–145)
Total Bilirubin: 0.7 mg/dL (ref 0.3–1.2)
Total Protein: 6.3 g/dL — ABNORMAL LOW (ref 6.5–8.1)

## 2023-04-10 MED ORDER — PALONOSETRON HCL INJECTION 0.25 MG/5ML
0.2500 mg | Freq: Once | INTRAVENOUS | Status: DC
  Filled 2023-04-10: qty 5

## 2023-04-10 MED ORDER — ATROPINE SULFATE 1 MG/ML IV SOLN: 0.5000 mg | Freq: Once | INTRAVENOUS | Status: DC | PRN

## 2023-04-10 MED ORDER — SODIUM CHLORIDE 0.9 % IV SOLN
60.0000 mg/m2 | Freq: Once | INTRAVENOUS | Status: DC
  Filled 2023-04-10: qty 34

## 2023-04-10 MED ORDER — SODIUM CHLORIDE 0.9 % IV SOLN: Freq: Once | INTRAVENOUS | Status: DC

## 2023-04-10 MED ORDER — SODIUM CHLORIDE 0.9% FLUSH
10.0000 mL | Freq: Once | INTRAVENOUS | Status: AC
  Administered 2023-04-10: 10 mL

## 2023-04-10 MED ORDER — SODIUM CHLORIDE 0.9 % IV SOLN
2400.0000 mg/m2 | INTRAVENOUS | Status: DC
  Filled 2023-04-10: qty 118

## 2023-04-10 MED ORDER — SODIUM CHLORIDE 0.9 % IV SOLN
400.0000 mg/m2 | Freq: Once | INTRAVENOUS | Status: DC
  Filled 2023-04-10: qty 49

## 2023-04-10 MED ORDER — SODIUM CHLORIDE 0.9 % IV SOLN
10.0000 mg | Freq: Once | INTRAVENOUS | Status: DC
  Filled 2023-04-10: qty 1

## 2023-04-10 MED ORDER — PANTOPRAZOLE SODIUM 40 MG PO TBEC: 40.0000 mg | DELAYED_RELEASE_TABLET | Freq: Every day | ORAL | 1 refills | Status: DC

## 2023-04-10 MED ORDER — PANCRELIPASE (LIP-PROT-AMYL) 36000-114000 UNITS PO CPEP: ORAL_CAPSULE | ORAL | 3 refills | Status: DC

## 2023-04-10 NOTE — Progress Notes (Signed)
Patient seen by Santiago Glad, NP  Vitals are within treatment parameters.  Labs reviewed: and are within treatment parameters.  Per physician team, patient is ready for treatment with no changes to treatment plan.

## 2023-04-10 NOTE — Telephone Encounter (Signed)
Notified patient of prior authorization approval for Pantoprazole 40 mg Tablets. Medication is approved through 04/09/2024.  No other needs or concerns voiced at this time.

## 2023-04-11 LAB — CANCER ANTIGEN 19-9: CA 19-9: 42 U/mL — ABNORMAL HIGH (ref 0–35)

## 2023-04-12 ENCOUNTER — Inpatient Hospital Stay: Payer: BC Managed Care – PPO

## 2023-04-16 DIAGNOSIS — C259 Malignant neoplasm of pancreas, unspecified: Secondary | ICD-10-CM | POA: Diagnosis not present

## 2023-04-16 DIAGNOSIS — C787 Secondary malignant neoplasm of liver and intrahepatic bile duct: Secondary | ICD-10-CM | POA: Diagnosis not present

## 2023-04-17 ENCOUNTER — Telehealth: Payer: Self-pay | Admitting: *Deleted

## 2023-04-17 NOTE — Telephone Encounter (Signed)
Creon Medication Prior Authorization Status  Processed CoverMyMeds KEY: [id number]  Approved Today  Per [employer] [place] Case ID: [id number]   Effective 04/16/2023 through 04/14/2024  Connected with [facility], [phone] (home) to advise of the above that he may pick up today from pharmacy.

## 2023-04-18 ENCOUNTER — Encounter: Payer: Self-pay | Admitting: Hematology

## 2023-04-21 LAB — GUARDANT 360

## 2023-04-25 ENCOUNTER — Inpatient Hospital Stay: Payer: BC Managed Care – PPO

## 2023-04-25 ENCOUNTER — Ambulatory Visit: Payer: BC Managed Care – PPO

## 2023-04-25 ENCOUNTER — Inpatient Hospital Stay: Payer: BC Managed Care – PPO | Attending: Hematology | Admitting: Hematology

## 2023-04-25 VITALS — BP 120/70 | HR 53 | Temp 98.0°F | Resp 18 | Ht 72.0 in | Wt 241.0 lb

## 2023-04-25 DIAGNOSIS — Z95828 Presence of other vascular implants and grafts: Secondary | ICD-10-CM

## 2023-04-25 DIAGNOSIS — T451X5A Adverse effect of antineoplastic and immunosuppressive drugs, initial encounter: Secondary | ICD-10-CM | POA: Diagnosis not present

## 2023-04-25 DIAGNOSIS — Z5111 Encounter for antineoplastic chemotherapy: Secondary | ICD-10-CM | POA: Insufficient documentation

## 2023-04-25 DIAGNOSIS — Z9221 Personal history of antineoplastic chemotherapy: Secondary | ICD-10-CM | POA: Diagnosis not present

## 2023-04-25 DIAGNOSIS — C250 Malignant neoplasm of head of pancreas: Secondary | ICD-10-CM | POA: Diagnosis not present

## 2023-04-25 DIAGNOSIS — R978 Other abnormal tumor markers: Secondary | ICD-10-CM | POA: Insufficient documentation

## 2023-04-25 DIAGNOSIS — C259 Malignant neoplasm of pancreas, unspecified: Secondary | ICD-10-CM

## 2023-04-25 DIAGNOSIS — C787 Secondary malignant neoplasm of liver and intrahepatic bile duct: Secondary | ICD-10-CM | POA: Diagnosis not present

## 2023-04-25 DIAGNOSIS — G620 Drug-induced polyneuropathy: Secondary | ICD-10-CM | POA: Insufficient documentation

## 2023-04-25 LAB — CBC WITH DIFFERENTIAL (CANCER CENTER ONLY)
Abs Immature Granulocytes: 0.05 10*3/uL (ref 0.00–0.07)
Basophils Absolute: 0.1 10*3/uL (ref 0.0–0.1)
Basophils Relative: 1 %
Eosinophils Absolute: 0.6 10*3/uL — ABNORMAL HIGH (ref 0.0–0.5)
Eosinophils Relative: 6 %
HCT: 36 % — ABNORMAL LOW (ref 39.0–52.0)
Hemoglobin: 12.1 g/dL — ABNORMAL LOW (ref 13.0–17.0)
Immature Granulocytes: 1 %
Lymphocytes Relative: 27 %
Lymphs Abs: 2.7 10*3/uL (ref 0.7–4.0)
MCH: 30.3 pg (ref 26.0–34.0)
MCHC: 33.6 g/dL (ref 30.0–36.0)
MCV: 90.2 fL (ref 80.0–100.0)
Monocytes Absolute: 1 10*3/uL (ref 0.1–1.0)
Monocytes Relative: 10 %
Neutro Abs: 5.5 10*3/uL (ref 1.7–7.7)
Neutrophils Relative %: 55 %
Platelet Count: 333 10*3/uL (ref 150–400)
RBC: 3.99 MIL/uL — ABNORMAL LOW (ref 4.22–5.81)
RDW: 14.3 % (ref 11.5–15.5)
WBC Count: 10 10*3/uL (ref 4.0–10.5)
nRBC: 0 % (ref 0.0–0.2)

## 2023-04-25 LAB — CMP (CANCER CENTER ONLY)
ALT: 18 U/L (ref 0–44)
AST: 21 U/L (ref 15–41)
Albumin: 3.7 g/dL (ref 3.5–5.0)
Alkaline Phosphatase: 75 U/L (ref 38–126)
Anion gap: 7 (ref 5–15)
BUN: 8 mg/dL (ref 6–20)
CO2: 26 mmol/L (ref 22–32)
Calcium: 8.9 mg/dL (ref 8.9–10.3)
Chloride: 107 mmol/L (ref 98–111)
Creatinine: 0.74 mg/dL (ref 0.61–1.24)
GFR, Estimated: >60 mL/min (ref >60)
Glucose, Bld: 101 mg/dL — ABNORMAL HIGH (ref 70–99)
Potassium: 3.8 mmol/L (ref 3.5–5.1)
Sodium: 140 mmol/L (ref 135–145)
Total Bilirubin: 0.5 mg/dL (ref 0.3–1.2)
Total Protein: 6.6 g/dL (ref 6.5–8.1)

## 2023-04-25 MED ORDER — HEPARIN SOD (PORK) LOCK FLUSH 100 UNIT/ML IV SOLN: 500.0000 [IU] | Freq: Once | INTRAVENOUS | Status: DC | PRN

## 2023-04-25 MED ORDER — SODIUM CHLORIDE 0.9 % IV SOLN
60.0000 mg/m2 | Freq: Once | INTRAVENOUS | Status: AC
  Administered 2023-04-25: 146.2 mg via INTRAVENOUS
  Filled 2023-04-25: qty 34

## 2023-04-25 MED ORDER — SODIUM CHLORIDE 0.9% FLUSH: 10.0000 mL | INTRAVENOUS | Status: DC | PRN

## 2023-04-25 MED ORDER — SODIUM CHLORIDE 0.9% FLUSH
10.0000 mL | Freq: Once | INTRAVENOUS | Status: AC
  Administered 2023-04-25: 10 mL

## 2023-04-25 MED ORDER — SODIUM CHLORIDE 0.9 % IV SOLN
400.0000 mg/m2 | Freq: Once | INTRAVENOUS | Status: AC
  Administered 2023-04-25: 980 mg via INTRAVENOUS
  Filled 2023-04-25: qty 49

## 2023-04-25 MED ORDER — ATROPINE SULFATE 1 MG/ML IV SOLN
0.5000 mg | Freq: Once | INTRAVENOUS | Status: AC | PRN
  Administered 2023-04-25: 0.5 mg via INTRAVENOUS
  Filled 2023-04-25: qty 1

## 2023-04-25 MED ORDER — SODIUM CHLORIDE 0.9 % IV SOLN
2400.0000 mg/m2 | INTRAVENOUS | Status: DC
  Administered 2023-04-25: 5900 mg via INTRAVENOUS
  Filled 2023-04-25: qty 118

## 2023-04-25 MED ORDER — SODIUM CHLORIDE 0.9 % IV SOLN
10.0000 mg | Freq: Once | INTRAVENOUS | Status: AC
  Administered 2023-04-25: 10 mg via INTRAVENOUS
  Filled 2023-04-25: qty 10

## 2023-04-25 MED ORDER — SODIUM CHLORIDE 0.9 % IV SOLN: Freq: Once | INTRAVENOUS | Status: AC

## 2023-04-25 MED ORDER — PALONOSETRON HCL INJECTION 0.25 MG/5ML
0.2500 mg | Freq: Once | INTRAVENOUS | Status: AC
  Administered 2023-04-25: 0.25 mg via INTRAVENOUS
  Filled 2023-04-25: qty 5

## 2023-04-25 NOTE — Assessment & Plan Note (Addendum)
metastatic to liver, stage IV, KRAS G12R (+), MMR normal  -incidental finding on lung cancer screening chest CT on 01/19/22. 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  -He began first-line palliative FOLFIRINOX on 03/13/22, but unfortunately did not respond well. CA 19-9 also rose on treatment. -he switched to second line gemcitabine/abraxane on 06/05/22. He tolerates moderate well and has continued to work full time. CA 19-9 has dropped on treatment. -restaging CT from 08/24/22 showed partial response to treatment, improved primary tumor, and liver/node mets, I discussed with him  -Pt was referred to see Dr. [name] on 10/10/2022 , surgery was discussed but not felt to be a surgical candidate due to his multiple liver metastasis (5) -Restaging CT scan on November 24, 2022 showed moderate disease progression in liver and the pancreas, his tumor marker also increased.  I have changed his chemotherapy to 5-FU and liposomal irinotecan, started on 12/04/22, he is tolerating well  -Restaging scan from March 06, 2023 showed slightly decreased pancreatic mass, but he has slight progression in liver metastasis.  Has been trending up lately, also indicating a mild progression. -We discussed there is very limited treatment option at this point, I would recommend him to try docetaxel, gemcitabine and capecitabine as next line therapy. -pt is clinically doing well, given the limited treatment options, we decided to continue current therapy until he has further disease progression. -I reviewed his Guardant360 results, which is negative for targetable mutations  -will continue chemo, plan to repeat CT in early Oct

## 2023-04-25 NOTE — Progress Notes (Signed)
Steve Mills   Telephone:(336) [phone] Fax:([phone]) [phone]   Clinic Follow up Note   Patient Care Team: [name], MD as PCP - General (Family Medicine) [name], MD as Consulting Physician (Oncology) Tito Dine., MD as Consulting Physician (General Surgery)  Date of Service:  04/25/2023  CHIEF COMPLAINT: f/u of metastatic pancreatic cancer     CURRENT THERAPY:   Liposomal irinotecan, leucovorin, 5FU q14 days; starting 12/04/22   ASSESSMENT:  Steve Mills is a 57 y.o. male with   Pancreatic cancer metastasized to liver [facility]) metastatic to liver, stage IV, KRAS G12R (+), MMR normal  -incidental finding on lung cancer screening chest CT on 01/19/22. 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  -He began first-line palliative FOLFIRINOX on 03/13/22, but unfortunately did not respond well. CA 19-9 also rose on treatment. -he switched to second line gemcitabine/abraxane on 06/05/22. He tolerates moderate well and has continued to work full time. CA 19-9 has dropped on treatment. -restaging CT from 08/24/22 showed partial response to treatment, improved primary tumor, and liver/node mets, I discussed with him  -Pt was referred to see Dr. [name] on 10/10/2022 , surgery was discussed but not felt to be a surgical candidate due to his multiple liver metastasis (5) -Restaging CT scan on November 24, 2022 showed moderate disease progression in liver and the pancreas, his tumor marker also increased.  I have changed his chemotherapy to 5-FU and liposomal irinotecan, started on 12/04/22, he is tolerating well  -Restaging scan from March 06, 2023 showed slightly decreased pancreatic mass, but he has slight progression in liver metastasis.  Has been trending up lately, also indicating a mild progression. -We discussed there is very limited treatment option at this point, I  would recommend him to try docetaxel, gemcitabine and capecitabine as next line therapy. -pt is clinically doing well, given the limited treatment options, we decided to continue current therapy until he has further disease progression. -I reviewed his Guardant360 results, which is negative for targetable mutations  -will continue chemo, plan to repeat CT in early Oct   Peripheral neuropathy due to chemotherapy [facility]) -Started in January 2024, secondary to oxaliplatin and Abraxane -Overall just numbness in fingers and toes, mild tingling, will continue to watch closely -He is taking B12, and use ice packs during chemoinfusion.  -overall mild and stable       PLAN: -lab reviewed -CMP pending -I reviewed Guardant 360 w/ pt -porceed with treatment if labs are adequate -I will order restaging scan next vist -lab/flush and f/u in 9/17   SUMMARY OF ONCOLOGIC HISTORY: Oncology History Overview Note   Cancer Staging  Pancreatic cancer metastasized to liver [facility]) Staging form: Exocrine Pancreas, AJCC 8th Edition - Clinical stage from 02/28/2022: Stage IV (cT2, cN1, pM1) - Signed by [name], MD on 03/02/2022 Stage prefix: Initial diagnosis     Pancreatic cancer metastasized to liver (HCC)  02/28/2022 Cancer Staging   Staging form: Exocrine Pancreas, AJCC 8th Edition - Clinical stage from 02/28/2022: Stage IV (cT2, cN1, pM1) - Signed by [name], MD on 03/02/2022 Stage prefix: Initial diagnosis   03/02/2022 Initial Diagnosis   Pancreatic cancer metastasized to liver (HCC)   03/13/2022 - 04/13/2022 Chemotherapy   Patient is on Treatment Plan : PANCREAS Modified FOLFIRINOX q14d x 4 cycles     03/13/2022 - 05/17/2022 Chemotherapy   Patient is on  Treatment Plan : PANCREAS Modified FOLFIRINOX q14d x 4 cycles     03/18/2022 Genetic Testing   Negative genetic testing on the CancerNext-Expanded+RNAinsight panel.  APC [id number] VUs identified.  The report date is March 18, 2022.  The  CancerNext-Expanded gene panel offered by [facility] and includes sequencing and rearrangement analysis for the following 77 genes: AIP, ALK, APC*, ATM*, AXIN2, BAP1, BARD1, BLM, BMPR1A, BRCA1*, BRCA2*, BRIP1*, CDC73, CDH1*, CDK4, CDKN1B, CDKN2A, CHEK2*, CTNNA1, DICER1, FANCC, FH, FLCN, GALNT12, KIF1B, LZTR1, MAX, MEN1, MET, MLH1*, MSH2*, MSH3, MSH6*, MUTYH*, NBN, NF1*, NF2, NTHL1, PALB2*, PHOX2B, PMS2*, POT1, PRKAR1A, PTCH1, PTEN*, RAD51C*, RAD51D*, RB1, RECQL, RET, SDHA, SDHAF2, SDHB, SDHC, SDHD, SMAD4, SMARCA4, SMARCB1, SMARCE1, STK11, SUFU, TMEM127, TP53*, TSC1, TSC2, VHL and XRCC2 (sequencing and deletion/duplication); EGFR, EGLN1, HOXB13, KIT, MITF, PDGFRA, POLD1, and POLE (sequencing only); EPCAM and GREM1 (deletion/duplication only). DNA and RNA analyses performed for * genes.    05/25/2022 Progression   Rising CA 19-9, CT CAP IMPRESSION: 1. Multiple new and enlarged hypodense, rim enhancing liver metastases. 2. Unchanged, ill-defined hypoenhancing mass of the pancreatic uncinate, consistent with pancreatic adenocarcinoma 3. Unchanged small prominent portacaval and gastrohepatic ligament nodes, modestly suspicious for nodal metastases. No overtly enlarged lymph nodes in the abdomen or pelvis. 4. Prostatomegaly.    06/05/2022 - 11/13/2022 Chemotherapy   Patient is on Treatment Plan : PANCREATIC Abraxane D1,8,15 + Gemcitabine D1,8,15 q28d     11/24/2022 Imaging   IMPRESSION: 1. Today's study demonstrates mild progression of disease as evidenced by slight enlargement of the mass in the uncinate process of the pancreas which demonstrates potential direct invasion into the superior wall of the third portion of the duodenum, but currently demonstrates no definite vascular involvement. Multiple hepatic lesions are generally stable to the prior study, with exception of enlargement of a lesion in segment 8, as detailed above. 2. No new sites of metastatic disease are noted elsewhere in the chest or  pelvis. 3. Aortic atherosclerosis.  Rising CA 19-9    12/04/2022 -  Chemotherapy   Patient is on Treatment Plan : PANCREAS Liposomal Irinotecan + Leucovorin + 5-FU IVCI q14d     03/06/2023 Imaging    IMPRESSION: 1. Mass in the pancreatic head demonstrates decreased size since prior study. 2. Multiple liver metastasis are mildly increased in size. No new lesions. 3. 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 Metastatic disease would be less likely. 4. Mild aortic atherosclerosis. 5. Nonspecific sclerotic foci in the pelvis, unchanged, likely benign bone islands.        INTERVAL HISTORY:  Steve Mills is here for a follow up of metastatic pancreatic cancer . He was last seen by NP Lacie on 04/10/2023. He presents to the clinic accompanied by wife. Pt state that him and his wife spent their anniversary at the beach. He state that he experience some swelling in his feet from walking a lot , but it has resolve the next day. Pt denies having any pain or BM issues.      All other systems were reviewed with the patient and are negative.  MEDICAL HISTORY:  Past Medical History:  Diagnosis Date   Family history of adverse reaction to anesthesia    mother allergic to ether    Family history of breast cancer    Family history of colon cancer    Pneumonia    hx of 2014  SURGICAL HISTORY: Past Surgical History:  Procedure Laterality Date   APPENDECTOMY     CHOLECYSTECTOMY N/A 07/12/2018   Procedure: LAPAROSCOPIC CHOLECYSTECTOMY WITH INTRAOPERATIVE CHOLANGIOGRAM;  Surgeon: Luretha Murphy, MD;  Location: [facility];  Service: General;  Laterality: N/A;   left wrist surgery      has plate in arm    NASAL POLYP SURGERY  2017   PORTACATH PLACEMENT N/A 03/10/2022   Procedure: INSERTION PORT-A-CATH;  Surgeon: Fritzi Mandes, MD;  Location: [facility];   Service: General;  Laterality: N/A;   VENTRAL HERNIA REPAIR N/A 07/15/2014   Procedure: LAPAROSCOPIC REPAIR INCARCARTED UMBILICAL  VENTRAL HERNIA, ;  Surgeon: Claud Kelp, MD;  Location: [facility];  Service: General;  Laterality: N/A;  With MESH    I have reviewed the social history and family history with the patient and they are unchanged from previous note.  ALLERGIES:  has No Known Allergies.  MEDICATIONS:  Current Outpatient Medications  Medication Sig Dispense Refill   KLOR-CON M20 20 MEQ tablet TAKE 1 TABLET BY MOUTH 2 TIMES A DAY FOR 5 DAYS THEN TAKE 1 TABLET DAILY 35 tablet 0   lidocaine-prilocaine (EMLA) cream Apply to affected area once 30 g 3   lipase/protease/amylase (CREON) 36000 UNITS CPEP capsule Take 2 capsules (72,000 Units total) by mouth 3 (three) times daily with meals. May also take 1 capsule (36,000 Units total) as needed (with snacks). 240 capsule 3   loperamide (IMODIUM A-D) 2 MG tablet Take 2 mg by mouth 4 (four) times daily as needed for diarrhea or loose stools.     ondansetron (ZOFRAN) 8 MG tablet Take 1 tablet (8 mg total) by mouth every 8 (eight) hours as needed for nausea or vomiting. Start on the third day after irinotecan (Patient not taking: Reported on 04/10/2023) 30 tablet 1   pantoprazole (PROTONIX) 40 MG tablet Take 1 tablet (40 mg total) by mouth daily. 30 tablet 1   prochlorperazine (COMPAZINE) 10 MG tablet Take 1 tablet (10 mg total) by mouth every 6 (six) hours as needed for nausea or vomiting. (Patient not taking: Reported on 04/10/2023) 30 tablet 1   valACYclovir (VALTREX) 500 MG tablet Take 1 tablet (500 mg total) by mouth 2 (two) times daily. 60 tablet 3   No current facility-administered medications for this visit.   Facility-Administered Medications Ordered in Other Visits  Medication Dose Route Frequency Provider Last Rate Last Admin   fluorouracil (ADRUCIL) 5,900 mg in sodium chloride 0.9 % 132 mL chemo infusion  2,400 mg/m2 (Treatment Plan  Recorded) Intravenous 1 day or 1 dose [name], MD       heparin lock flush 100 unit/mL  500 Units Intracatheter Once PRN [name], MD       irinotecan LIPOSOME (ONIVYDE) 146.2 mg in sodium chloride 0.9 % 500 mL chemo infusion  60 mg/m2 (Treatment Plan Recorded) Intravenous Once [name], MD 356 mL/hr at 04/25/23 1056 146.2 mg at 04/25/23 1056   leucovorin 980 mg in sodium chloride 0.9 % 250 mL infusion  400 mg/m2 (Treatment Plan Recorded) Intravenous Once [name], MD       sodium chloride flush (NS) 0.9 % injection 10 mL  10 mL Intracatheter PRN [name], MD        PHYSICAL EXAMINATION: ECOG PERFORMANCE STATUS: 0 - Asymptomatic  Vitals:   04/25/23 0921  BP: 120/70  Pulse: (!) 53  Resp: 18  Temp: 98 F (36.7 C)  SpO2: 97%   Wt Readings from Last 3  Encounters:  04/25/23 241 lb (109.3 kg)  04/10/23 237 lb 12.8 oz (107.9 kg)  03/27/23 243 lb 4.8 oz (110.4 kg)     GENERAL:alert, no distress and comfortable SKIN: skin color normal, no rashes or significant lesions EYES: normal, Conjunctiva are pink and non-injected, sclera clear  NEURO: alert & oriented x 3 with fluent speech  LABORATORY DATA:  I have reviewed the data as listed    Latest Ref Rng & Units 04/25/2023    8:36 AM 04/10/2023    8:01 AM 03/27/2023   11:38 AM  CBC  WBC 4.0 - 10.5 K/uL 10.0  6.8  7.2   Hemoglobin 13.0 - 17.0 g/dL 40.1  02.7  25.3   Hematocrit 39.0 - 52.0 % 36.0  35.0  33.5   Platelets 150 - 400 K/uL 333  267  297         Latest Ref Rng & Units 04/25/2023    8:36 AM 04/10/2023    8:01 AM 03/27/2023   11:38 AM  CMP  Glucose 70 - 99 mg/dL 664  403  474   BUN 6 - 20 mg/dL 8  5  8    Creatinine 0.61 - 1.24 mg/dL 2.59  5.63  8.75   Sodium 135 - 145 mmol/L 140  141  139   Potassium 3.5 - 5.1 mmol/L 3.8  3.9  3.2   Chloride 98 - 111 mmol/L 107  107  106   CO2 22 - 32 mmol/L 26  27  27    Calcium 8.9 - 10.3 mg/dL 8.9  8.9  8.6   Total Protein 6.5 - 8.1 g/dL 6.6  6.3  6.1   Total Bilirubin 0.3 - 1.2  mg/dL 0.5  0.7  0.5   Alkaline Phos 38 - 126 U/L 75  74  71   AST 15 - 41 U/L 21  15  16    ALT 0 - 44 U/L 18  19  20        RADIOGRAPHIC STUDIES: I have personally reviewed the radiological images as listed and agreed with the findings in the report. No results found.    Orders Placed This Encounter  Procedures   CBC with Differential ([facility] Only)    Standing Status:   Future    Standing Expiration Date:   05/21/2024   CMP ([facility] only)    Standing Status:   Future    Standing Expiration Date:   05/21/2024   CBC with Differential ([facility])    Standing Status:   Future    Standing Expiration Date:   06/04/2024   CMP ([facility])    Standing Status:   Future    Standing Expiration Date:   06/04/2024   CBC with Differential ([facility])    Standing Status:   Future    Standing Expiration Date:   06/18/2024   CMP ([facility])    Standing Status:   Future    Standing Expiration Date:   06/18/2024   All questions were answered. The patient knows to call the clinic with any problems, questions or concerns. No barriers to learning was detected. The total time spent in the appointment was 30 minutes.     [name], MD 04/25/2023   Carolin Coy, CMA, am acting as scribe for [name], MD.   I have reviewed the above documentation for accuracy and completeness, and I agree with the above.

## 2023-04-25 NOTE — Patient Instructions (Addendum)
[place] [facility]  Discharge Instructions: Thank you for choosing [facility] to provide your oncology and hematology care.   If you have a lab appointment with the Cancer Center, please go directly to the Cancer Center and check in at the registration area.   Wear comfortable clothing and clothing appropriate for easy access to any Portacath or PICC line.   We strive to give you quality time with your provider. You may need to reschedule your appointment if you arrive late (15 or more minutes).  Arriving late affects you and other patients whose appointments are after yours.  Also, if you miss three or more appointments without notifying the office, you may be dismissed from the clinic at the provider's discretion.      For prescription refill requests, have your pharmacy contact our office and allow 72 hours for refills to be completed.    Today you received the following chemotherapy and/or immunotherapy agents: Irinotecan liposome and Fluorouracil      To help prevent nausea and vomiting after your treatment, we encourage you to take your nausea medication as directed.  BELOW ARE SYMPTOMS THAT SHOULD BE REPORTED IMMEDIATELY: *FEVER GREATER THAN 100.4 F (38 C) OR HIGHER *CHILLS OR SWEATING *NAUSEA AND VOMITING THAT IS NOT CONTROLLED WITH YOUR NAUSEA MEDICATION *UNUSUAL SHORTNESS OF BREATH *UNUSUAL BRUISING OR BLEEDING *URINARY PROBLEMS (pain or burning when urinating, or frequent urination) *BOWEL PROBLEMS (unusual diarrhea, constipation, pain near the anus) TENDERNESS IN MOUTH AND THROAT WITH OR WITHOUT PRESENCE OF ULCERS (sore throat, sores in mouth, or a toothache) UNUSUAL RASH, SWELLING OR PAIN  UNUSUAL VAGINAL DISCHARGE OR ITCHING   Items with * indicate a potential emergency and should be followed up as soon as possible or go to the Emergency Department if any problems should occur.  Please show the CHEMOTHERAPY ALERT CARD or  IMMUNOTHERAPY ALERT CARD at check-in to the Emergency Department and triage nurse.  Should you have questions after your visit or need to cancel or reschedule your appointment, please contact [place] [facility]  Dept: [phone]  and follow the prompts.  Office hours are 8:00 a.m. to 4:30 p.m. Monday - Friday. Please note that voicemails left after 4:00 p.m. may not be returned until the following business day.  We are closed weekends and major holidays. You have access to a nurse at all times for urgent questions. Please call the main number to the clinic Dept: [phone] and follow the prompts.   For any non-urgent questions, you may also contact your provider using MyChart. We now offer e-Visits for anyone 27 and older to request care online for non-urgent symptoms. For details visit [email or web address].   Also download the MyChart app! Go to the app store, search "MyChart", open the app, select [facility], and log in with your MyChart username and password.

## 2023-04-25 NOTE — Assessment & Plan Note (Signed)
-  Started in January 2024, secondary to oxaliplatin and Abraxane -Overall just numbness in fingers and toes, mild tingling, will continue to watch closely -He is taking B12, and use ice packs during chemoinfusion.  -overall mild and stable  

## 2023-04-26 LAB — CANCER ANTIGEN 19-9: CA 19-9: 54 U/mL — ABNORMAL HIGH (ref 0–35)

## 2023-04-27 ENCOUNTER — Other Ambulatory Visit: Payer: Self-pay

## 2023-04-27 ENCOUNTER — Inpatient Hospital Stay: Payer: BC Managed Care – PPO

## 2023-04-27 VITALS — BP 122/74 | HR 68 | Temp 98.3°F | Resp 18

## 2023-04-27 DIAGNOSIS — R978 Other abnormal tumor markers: Secondary | ICD-10-CM | POA: Diagnosis not present

## 2023-04-27 DIAGNOSIS — C259 Malignant neoplasm of pancreas, unspecified: Secondary | ICD-10-CM

## 2023-04-27 DIAGNOSIS — C250 Malignant neoplasm of head of pancreas: Secondary | ICD-10-CM | POA: Diagnosis not present

## 2023-04-27 DIAGNOSIS — Z9221 Personal history of antineoplastic chemotherapy: Secondary | ICD-10-CM | POA: Diagnosis not present

## 2023-04-27 DIAGNOSIS — T451X5A Adverse effect of antineoplastic and immunosuppressive drugs, initial encounter: Secondary | ICD-10-CM | POA: Diagnosis not present

## 2023-04-27 DIAGNOSIS — Z5111 Encounter for antineoplastic chemotherapy: Secondary | ICD-10-CM | POA: Diagnosis not present

## 2023-04-27 DIAGNOSIS — G620 Drug-induced polyneuropathy: Secondary | ICD-10-CM | POA: Diagnosis not present

## 2023-04-27 DIAGNOSIS — C787 Secondary malignant neoplasm of liver and intrahepatic bile duct: Secondary | ICD-10-CM | POA: Diagnosis not present

## 2023-04-27 MED ORDER — SODIUM CHLORIDE 0.9% FLUSH
10.0000 mL | INTRAVENOUS | Status: DC | PRN
  Administered 2023-04-27: 10 mL

## 2023-04-27 MED ORDER — HEPARIN SOD (PORK) LOCK FLUSH 100 UNIT/ML IV SOLN
500.0000 [IU] | Freq: Once | INTRAVENOUS | Status: AC | PRN
  Administered 2023-04-27: 500 [IU]

## 2023-05-04 ENCOUNTER — Other Ambulatory Visit: Payer: Self-pay

## 2023-05-06 DIAGNOSIS — C787 Secondary malignant neoplasm of liver and intrahepatic bile duct: Secondary | ICD-10-CM | POA: Diagnosis not present

## 2023-05-06 DIAGNOSIS — C259 Malignant neoplasm of pancreas, unspecified: Secondary | ICD-10-CM | POA: Diagnosis not present

## 2023-05-07 MED FILL — Dexamethasone Sodium Phosphate Inj 100 MG/10ML: INTRAMUSCULAR | Qty: 1 | Status: AC

## 2023-05-07 NOTE — Assessment & Plan Note (Signed)
metastatic to liver, stage IV, KRAS G12R (+), MMR normal  -incidental finding on lung cancer screening chest CT on 01/19/22. 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  -He began first-line palliative FOLFIRINOX on 03/13/22, but unfortunately did not respond well. CA 19-9 also rose on treatment. -he switched to second line gemcitabine/abraxane on 06/05/22. He tolerates moderate well and has continued to work full time. CA 19-9 has dropped on treatment. -restaging CT from 08/24/22 showed partial response to treatment, improved primary tumor, and liver/node mets, I discussed with him  -Pt was referred to see Dr. [name] on 10/10/2022 , surgery was discussed but not felt to be a surgical candidate due to his multiple liver metastasis (5) -Restaging CT scan on November 24, 2022 showed moderate disease progression in liver and the pancreas, his tumor marker also increased.  I have changed his chemotherapy to 5-FU and liposomal irinotecan, started on 12/04/22, he is tolerating well  -Restaging scan from March 06, 2023 showed slightly decreased pancreatic mass, but he has slight progression in liver metastasis.  Has been trending up lately, also indicating a mild progression. -We discussed there is very limited treatment option at this point, I would recommend him to try docetaxel, gemcitabine and capecitabine as next line therapy. -pt is clinically doing well, given the limited treatment options, we decided to continue current therapy until he has further disease progression. -I reviewed his Guardant360 results, which is negative for targetable mutations  -will continue chemo, plan to repeat CT in early Oct

## 2023-05-07 NOTE — Assessment & Plan Note (Signed)
-  Started in January 2024, secondary to oxaliplatin and Abraxane -Overall just numbness in fingers and toes, mild tingling, will continue to watch closely -He is taking B12, and use ice packs during chemoinfusion.  -overall mild and stable

## 2023-05-08 ENCOUNTER — Other Ambulatory Visit: Payer: Self-pay

## 2023-05-08 ENCOUNTER — Inpatient Hospital Stay: Payer: BC Managed Care – PPO

## 2023-05-08 ENCOUNTER — Inpatient Hospital Stay: Payer: BC Managed Care – PPO | Admitting: Hematology

## 2023-05-08 ENCOUNTER — Encounter: Payer: Self-pay | Admitting: Hematology

## 2023-05-08 VITALS — BP 114/66 | HR 56 | Temp 98.9°F | Resp 15 | Ht 72.0 in | Wt 241.3 lb

## 2023-05-08 DIAGNOSIS — C259 Malignant neoplasm of pancreas, unspecified: Secondary | ICD-10-CM

## 2023-05-08 DIAGNOSIS — T451X5A Adverse effect of antineoplastic and immunosuppressive drugs, initial encounter: Secondary | ICD-10-CM | POA: Diagnosis not present

## 2023-05-08 DIAGNOSIS — C250 Malignant neoplasm of head of pancreas: Secondary | ICD-10-CM | POA: Diagnosis not present

## 2023-05-08 DIAGNOSIS — C787 Secondary malignant neoplasm of liver and intrahepatic bile duct: Secondary | ICD-10-CM | POA: Diagnosis not present

## 2023-05-08 DIAGNOSIS — G620 Drug-induced polyneuropathy: Secondary | ICD-10-CM

## 2023-05-08 DIAGNOSIS — R978 Other abnormal tumor markers: Secondary | ICD-10-CM | POA: Diagnosis not present

## 2023-05-08 DIAGNOSIS — Z5111 Encounter for antineoplastic chemotherapy: Secondary | ICD-10-CM | POA: Diagnosis not present

## 2023-05-08 DIAGNOSIS — Z9221 Personal history of antineoplastic chemotherapy: Secondary | ICD-10-CM | POA: Diagnosis not present

## 2023-05-08 DIAGNOSIS — Z95828 Presence of other vascular implants and grafts: Secondary | ICD-10-CM

## 2023-05-08 LAB — CBC WITH DIFFERENTIAL (CANCER CENTER ONLY)
Abs Immature Granulocytes: 0.02 10*3/uL (ref 0.00–0.07)
Basophils Absolute: 0 10*3/uL (ref 0.0–0.1)
Basophils Relative: 1 %
Eosinophils Absolute: 1 10*3/uL — ABNORMAL HIGH (ref 0.0–0.5)
Eosinophils Relative: 12 %
HCT: 35.1 % — ABNORMAL LOW (ref 39.0–52.0)
Hemoglobin: 12 g/dL — ABNORMAL LOW (ref 13.0–17.0)
Immature Granulocytes: 0 %
Lymphocytes Relative: 28 %
Lymphs Abs: 2.2 10*3/uL (ref 0.7–4.0)
MCH: 30.8 pg (ref 26.0–34.0)
MCHC: 34.2 g/dL (ref 30.0–36.0)
MCV: 90.2 fL (ref 80.0–100.0)
Monocytes Absolute: 0.4 10*3/uL (ref 0.1–1.0)
Monocytes Relative: 4 %
Neutro Abs: 4.5 10*3/uL (ref 1.7–7.7)
Neutrophils Relative %: 55 %
Platelet Count: 236 10*3/uL (ref 150–400)
RBC: 3.89 MIL/uL — ABNORMAL LOW (ref 4.22–5.81)
RDW: 14.4 % (ref 11.5–15.5)
WBC Count: 8.1 10*3/uL (ref 4.0–10.5)
nRBC: 0 % (ref 0.0–0.2)

## 2023-05-08 LAB — CMP (CANCER CENTER ONLY)
ALT: 18 U/L (ref 0–44)
AST: 17 U/L (ref 15–41)
Albumin: 3.6 g/dL (ref 3.5–5.0)
Alkaline Phosphatase: 69 U/L (ref 38–126)
Anion gap: 7 (ref 5–15)
BUN: 10 mg/dL (ref 6–20)
CO2: 25 mmol/L (ref 22–32)
Calcium: 8.8 mg/dL — ABNORMAL LOW (ref 8.9–10.3)
Chloride: 106 mmol/L (ref 98–111)
Creatinine: 0.78 mg/dL (ref 0.61–1.24)
GFR, Estimated: >60 mL/min (ref >60)
Glucose, Bld: 139 mg/dL — ABNORMAL HIGH (ref 70–99)
Potassium: 3.6 mmol/L (ref 3.5–5.1)
Sodium: 138 mmol/L (ref 135–145)
Total Bilirubin: 0.5 mg/dL (ref 0.3–1.2)
Total Protein: 6.5 g/dL (ref 6.5–8.1)

## 2023-05-08 MED ORDER — SODIUM CHLORIDE 0.9 % IV SOLN: Freq: Once | INTRAVENOUS | Status: AC

## 2023-05-08 MED ORDER — ATROPINE SULFATE 1 MG/ML IV SOLN
0.5000 mg | Freq: Once | INTRAVENOUS | Status: AC | PRN
  Administered 2023-05-08: 0.5 mg via INTRAVENOUS
  Filled 2023-05-08: qty 1

## 2023-05-08 MED ORDER — SODIUM CHLORIDE 0.9 % IV SOLN
2400.0000 mg/m2 | INTRAVENOUS | Status: DC
  Administered 2023-05-08: 5900 mg via INTRAVENOUS
  Filled 2023-05-08: qty 118

## 2023-05-08 MED ORDER — SODIUM CHLORIDE 0.9 % IV SOLN
60.0000 mg/m2 | Freq: Once | INTRAVENOUS | Status: AC
  Administered 2023-05-08: 146.2 mg via INTRAVENOUS
  Filled 2023-05-08: qty 34

## 2023-05-08 MED ORDER — HEPARIN SOD (PORK) LOCK FLUSH 100 UNIT/ML IV SOLN: 500.0000 [IU] | Freq: Once | INTRAVENOUS | Status: DC | PRN

## 2023-05-08 MED ORDER — SODIUM CHLORIDE 0.9 % IV SOLN
10.0000 mg | Freq: Once | INTRAVENOUS | Status: AC
  Administered 2023-05-08: 10 mg via INTRAVENOUS
  Filled 2023-05-08: qty 10

## 2023-05-08 MED ORDER — SODIUM CHLORIDE 0.9% FLUSH: 10.0000 mL | INTRAVENOUS | Status: DC | PRN

## 2023-05-08 MED ORDER — SODIUM CHLORIDE 0.9% FLUSH
10.0000 mL | INTRAVENOUS | Status: DC | PRN
  Administered 2023-05-08: 10 mL via INTRAVENOUS

## 2023-05-08 MED ORDER — PALONOSETRON HCL INJECTION 0.25 MG/5ML
0.2500 mg | Freq: Once | INTRAVENOUS | Status: AC
  Administered 2023-05-08: 0.25 mg via INTRAVENOUS
  Filled 2023-05-08: qty 5

## 2023-05-08 MED ORDER — SODIUM CHLORIDE 0.9 % IV SOLN
400.0000 mg/m2 | Freq: Once | INTRAVENOUS | Status: AC
  Administered 2023-05-08: 980 mg via INTRAVENOUS
  Filled 2023-05-08: qty 49

## 2023-05-08 MED ORDER — COLD PACK MISC ONCOLOGY: 1.0000 | Freq: Once | Status: DC | PRN

## 2023-05-08 NOTE — Patient Instructions (Signed)
[place] [facility]  Discharge Instructions: Thank you for choosing [facility] to provide your oncology and hematology care.   If you have a lab appointment with the Cancer Center, please go directly to the Cancer Center and check in at the registration area.   Wear comfortable clothing and clothing appropriate for easy access to any Portacath or PICC line.   We strive to give you quality time with your provider. You may need to reschedule your appointment if you arrive late (15 or more minutes).  Arriving late affects you and other patients whose appointments are after yours.  Also, if you miss three or more appointments without notifying the office, you may be dismissed from the clinic at the provider's discretion.      For prescription refill requests, have your pharmacy contact our office and allow 72 hours for refills to be completed.    Today you received the following chemotherapy and/or immunotherapy agents: Liposomal Irinotecan, Leucovorin, Fluorouracil.       To help prevent nausea and vomiting after your treatment, we encourage you to take your nausea medication as directed.  BELOW ARE SYMPTOMS THAT SHOULD BE REPORTED IMMEDIATELY: *FEVER GREATER THAN 100.4 F (38 C) OR HIGHER *CHILLS OR SWEATING *NAUSEA AND VOMITING THAT IS NOT CONTROLLED WITH YOUR NAUSEA MEDICATION *UNUSUAL SHORTNESS OF BREATH *UNUSUAL BRUISING OR BLEEDING *URINARY PROBLEMS (pain or burning when urinating, or frequent urination) *BOWEL PROBLEMS (unusual diarrhea, constipation, pain near the anus) TENDERNESS IN MOUTH AND THROAT WITH OR WITHOUT PRESENCE OF ULCERS (sore throat, sores in mouth, or a toothache) UNUSUAL RASH, SWELLING OR PAIN  UNUSUAL VAGINAL DISCHARGE OR ITCHING   Items with * indicate a potential emergency and should be followed up as soon as possible or go to the Emergency Department if any problems should occur.  Please show the CHEMOTHERAPY ALERT  CARD or IMMUNOTHERAPY ALERT CARD at check-in to the Emergency Department and triage nurse.  Should you have questions after your visit or need to cancel or reschedule your appointment, please contact [place] [facility]  Dept: [phone]  and follow the prompts.  Office hours are 8:00 a.m. to 4:30 p.m. Monday - Friday. Please note that voicemails left after 4:00 p.m. may not be returned until the following business day.  We are closed weekends and major holidays. You have access to a nurse at all times for urgent questions. Please call the main number to the clinic Dept: [phone] and follow the prompts.   For any non-urgent questions, you may also contact your provider using MyChart. We now offer e-Visits for anyone 69 and older to request care online for non-urgent symptoms. For details visit [email or web address].   Also download the MyChart app! Go to the app store, search "MyChart", open the app, select [place], and log in with your MyChart username and password.

## 2023-05-08 NOTE — Progress Notes (Signed)
[facility]   [phone] [phone]   Clinic Follow up Note   Patient Care Team: [name], MD as PCP - General (Family Medicine) [name], MD as Consulting Physician (Oncology) Tito Dine., MD as Consulting Physician (General Surgery)  Date of Service:  05/08/2023  CHIEF COMPLAINT: f/u of metastatic pancreatic cancer   CURRENT THERAPY:   Liposomal irinotecan, leucovorin, 5FU q14 days; starting 12/04/22   ASSESSMENT:  Steve Mills is a 57 y.o. male with   Pancreatic cancer metastasized to liver [facility]) metastatic to liver, stage IV, KRAS G12R (+), MMR normal  -incidental finding on lung cancer screening chest CT on 01/19/22. 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  -He began first-line palliative FOLFIRINOX on 03/13/22, but unfortunately did not respond well. CA 19-9 also rose on treatment. -he switched to second line gemcitabine/abraxane on 06/05/22. He tolerates moderate well and has continued to work full time. CA 19-9 has dropped on treatment. -restaging CT from 08/24/22 showed partial response to treatment, improved primary tumor, and liver/node mets, I discussed with him  -Pt was referred to see Dr. [name] on 10/10/2022 , surgery was discussed but not felt to be a surgical candidate due to his multiple liver metastasis (5) -Restaging CT scan on November 24, 2022 showed moderate disease progression in liver and the pancreas, his tumor marker also increased.  I have changed his chemotherapy to 5-FU and liposomal irinotecan, started on 12/04/22, he is tolerating well  -Restaging scan from March 06, 2023 showed slightly decreased pancreatic mass, but he has slight progression in liver metastasis.  Has been trending up lately, also indicating a mild progression. -We discussed there is very limited treatment option at this point, I would  recommend him to try docetaxel, gemcitabine and capecitabine as next line therapy. -pt is clinically doing well, given the limited treatment options, we decided to continue current therapy until he has further disease progression. -I reviewed his Guardant360 results, which is negative for targetable mutations  -will continue chemo, plan to repeat CT in early Oct   Peripheral neuropathy due to chemotherapy [facility]) -Started in January 2024, secondary to oxaliplatin and Abraxane -Overall just numbness in fingers and toes, mild tingling, will continue to watch closely -He is taking B12, and use ice packs during chemoinfusion.  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  He tried Lomotil before and did not tolerate due to nausea -We prescribed Creon before, he did not pick up.  His weight is stable, eating well, will monitor for now.    PLAN: -lab -reviewed -CMP-pending -proceed with treatment today of labs are adequate -I order CT CAP in 1 month -lab/flush and f/u 05/22/2023   SUMMARY OF ONCOLOGIC HISTORY: Oncology History Overview Note   Cancer Staging  Pancreatic cancer metastasized to liver [facility]) Staging form: Exocrine Pancreas, AJCC 8th Edition - Clinical stage from 02/28/2022: Stage IV (cT2, cN1, pM1) - Signed by [name], MD on 03/02/2022 Stage prefix: Initial diagnosis     Pancreatic cancer metastasized to liver (HCC)  02/28/2022 Cancer Staging   Staging form: Exocrine Pancreas, AJCC 8th Edition - Clinical stage from 02/28/2022: Stage IV (cT2, cN1, pM1) - Signed by [name], MD on 03/02/2022 Stage prefix: Initial diagnosis   03/02/2022  Initial Diagnosis   Pancreatic cancer metastasized to liver (HCC)   03/13/2022 - 04/13/2022 Chemotherapy   Patient is on Treatment Plan : PANCREAS Modified FOLFIRINOX q14d x 4 cycles     03/13/2022 - 05/17/2022 Chemotherapy    Patient is on Treatment Plan : PANCREAS Modified FOLFIRINOX q14d x 4 cycles     03/18/2022 Genetic Testing   Negative genetic testing on the CancerNext-Expanded+RNAinsight panel.  APC [id number] VUs identified.  The report date is March 18, 2022.  The CancerNext-Expanded gene panel offered by [facility] and includes sequencing and rearrangement analysis for the following 77 genes: AIP, ALK, APC*, ATM*, AXIN2, BAP1, BARD1, BLM, BMPR1A, BRCA1*, BRCA2*, BRIP1*, CDC73, CDH1*, CDK4, CDKN1B, CDKN2A, CHEK2*, CTNNA1, DICER1, FANCC, FH, FLCN, GALNT12, KIF1B, LZTR1, MAX, MEN1, MET, MLH1*, MSH2*, MSH3, MSH6*, MUTYH*, NBN, NF1*, NF2, NTHL1, PALB2*, PHOX2B, PMS2*, POT1, PRKAR1A, PTCH1, PTEN*, RAD51C*, RAD51D*, RB1, RECQL, RET, SDHA, SDHAF2, SDHB, SDHC, SDHD, SMAD4, SMARCA4, SMARCB1, SMARCE1, STK11, SUFU, TMEM127, TP53*, TSC1, TSC2, VHL and XRCC2 (sequencing and deletion/duplication); EGFR, EGLN1, HOXB13, KIT, MITF, PDGFRA, POLD1, and POLE (sequencing only); EPCAM and GREM1 (deletion/duplication only). DNA and RNA analyses performed for * genes.    05/25/2022 Progression   Rising CA 19-9, CT CAP IMPRESSION: 1. Multiple new and enlarged hypodense, rim enhancing liver metastases. 2. Unchanged, ill-defined hypoenhancing mass of the pancreatic uncinate, consistent with pancreatic adenocarcinoma 3. Unchanged small prominent portacaval and gastrohepatic ligament nodes, modestly suspicious for nodal metastases. No overtly enlarged lymph nodes in the abdomen or pelvis. 4. Prostatomegaly.    06/05/2022 - 11/13/2022 Chemotherapy   Patient is on Treatment Plan : PANCREATIC Abraxane D1,8,15 + Gemcitabine D1,8,15 q28d     11/24/2022 Imaging   IMPRESSION: 1. Today's study demonstrates mild progression of disease as evidenced by slight enlargement of the mass in the uncinate process of the pancreas which demonstrates potential direct invasion into the superior wall of the third portion of the duodenum, but currently  demonstrates no definite vascular involvement. Multiple hepatic lesions are generally stable to the prior study, with exception of enlargement of a lesion in segment 8, as detailed above. 2. No new sites of metastatic disease are noted elsewhere in the chest or pelvis. 3. Aortic atherosclerosis.  Rising CA 19-9    12/04/2022 -  Chemotherapy   Patient is on Treatment Plan : PANCREAS Liposomal Irinotecan + Leucovorin + 5-FU IVCI q14d     03/06/2023 Imaging    IMPRESSION: 1. Mass in the pancreatic head demonstrates decreased size since prior study. 2. Multiple liver metastasis are mildly increased in size. No new lesions. 3. 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 Metastatic disease would be less likely. 4. Mild aortic atherosclerosis. 5. Nonspecific sclerotic foci in the pelvis, unchanged, likely benign bone islands.        INTERVAL HISTORY:  Steve Mills is here for a follow up of metastatic pancreatic cancer. He was last seen by  me on 04/25/2023. He presents to the clinic accompanied by wife. Pt state that he has less diarrhea.He take the Imodium for symptoms.     All other systems were reviewed with the patient and are negative.  MEDICAL HISTORY:  Past Medical History:  Diagnosis Date   Family history of adverse reaction to anesthesia    mother allergic to ether    Family history of breast cancer    Family history of colon cancer  Pneumonia    hx of 2014     SURGICAL HISTORY: Past Surgical History:  Procedure Laterality Date   APPENDECTOMY     CHOLECYSTECTOMY N/A 07/12/2018   Procedure: LAPAROSCOPIC CHOLECYSTECTOMY WITH INTRAOPERATIVE CHOLANGIOGRAM;  Surgeon: Luretha Murphy, MD;  Location: [facility] ORS;  Service: General;  Laterality: N/A;   left wrist surgery      has plate in arm    NASAL POLYP SURGERY  2017   PORTACATH  PLACEMENT N/A 03/10/2022   Procedure: INSERTION PORT-A-CATH;  Surgeon: Fritzi Mandes, MD;  Location: [facility];  Service: General;  Laterality: N/A;   VENTRAL HERNIA REPAIR N/A 07/15/2014   Procedure: LAPAROSCOPIC REPAIR INCARCARTED UMBILICAL  VENTRAL HERNIA, ;  Surgeon: Claud Kelp, MD;  Location: [facility] ORS;  Service: General;  Laterality: N/A;  With MESH    I have reviewed the social history and family history with the patient and they are unchanged from previous note.  ALLERGIES:  has No Known Allergies.  MEDICATIONS:  Current Outpatient Medications  Medication Sig Dispense Refill   KLOR-CON M20 20 MEQ tablet TAKE 1 TABLET BY MOUTH 2 TIMES A DAY FOR 5 DAYS THEN TAKE 1 TABLET DAILY 35 tablet 0   lidocaine-prilocaine (EMLA) cream Apply to affected area once 30 g 3   lipase/protease/amylase (CREON) 36000 UNITS CPEP capsule Take 2 capsules (72,000 Units total) by mouth 3 (three) times daily with meals. May also take 1 capsule (36,000 Units total) as needed (with snacks). 240 capsule 3   loperamide (IMODIUM A-D) 2 MG tablet Take 2 mg by mouth 4 (four) times daily as needed for diarrhea or loose stools.     ondansetron (ZOFRAN) 8 MG tablet Take 1 tablet (8 mg total) by mouth every 8 (eight) hours as needed for nausea or vomiting. Start on the third day after irinotecan (Patient not taking: Reported on 04/10/2023) 30 tablet 1   pantoprazole (PROTONIX) 40 MG tablet Take 1 tablet (40 mg total) by mouth daily. 30 tablet 1   prochlorperazine (COMPAZINE) 10 MG tablet Take 1 tablet (10 mg total) by mouth every 6 (six) hours as needed for nausea or vomiting. (Patient not taking: Reported on 04/10/2023) 30 tablet 1   valACYclovir (VALTREX) 500 MG tablet Take 1 tablet (500 mg total) by mouth 2 (two) times daily. 60 tablet 3   No current facility-administered medications for this visit.    PHYSICAL EXAMINATION: ECOG PERFORMANCE STATUS: 1 - Symptomatic but completely ambulatory  Vitals:   05/08/23 1035   BP: 114/66  Pulse: (!) 56  Resp: 15  Temp: 98.9 F (37.2 C)  SpO2: 98%   Wt Readings from Last 3 Encounters:  05/08/23 241 lb 4.8 oz (109.5 kg)  04/25/23 241 lb (109.3 kg)  04/10/23 237 lb 12.8 oz (107.9 kg)     GENERAL:alert, no distress and comfortable SKIN: skin color normal, no rashes or significant lesions EYES: normal, Conjunctiva are pink and non-injected, sclera clear  NEURO: alert & oriented x 3 with fluent speech  LABORATORY DATA:  I have reviewed the data as listed    Latest Ref Rng & Units 05/08/2023   10:11 AM 04/25/2023    8:36 AM 04/10/2023    8:01 AM  CBC  WBC 4.0 - 10.5 K/uL 8.1  10.0  6.8   Hemoglobin 13.0 - 17.0 g/dL 40.9  81.1  91.4   Hematocrit 39.0 - 52.0 % 35.1  36.0  35.0   Platelets 150 - 400 K/uL 236  333  267  Latest Ref Rng & Units 04/25/2023    8:36 AM 04/10/2023    8:01 AM 03/27/2023   11:38 AM  CMP  Glucose 70 - 99 mg/dL 629  528  413   BUN 6 - 20 mg/dL 8  5  8    Creatinine 0.61 - 1.24 mg/dL 2.44  0.10  2.72   Sodium 135 - 145 mmol/L 140  141  139   Potassium 3.5 - 5.1 mmol/L 3.8  3.9  3.2   Chloride 98 - 111 mmol/L 107  107  106   CO2 22 - 32 mmol/L 26  27  27    Calcium 8.9 - 10.3 mg/dL 8.9  8.9  8.6   Total Protein 6.5 - 8.1 g/dL 6.6  6.3  6.1   Total Bilirubin 0.3 - 1.2 mg/dL 0.5  0.7  0.5   Alkaline Phos 38 - 126 U/L 75  74  71   AST 15 - 41 U/L 21  15  16    ALT 0 - 44 U/L 18  19  20        RADIOGRAPHIC STUDIES: I have personally reviewed the radiological images as listed and agreed with the findings in the report. No results found.    Orders Placed This Encounter  Procedures   CT CHEST ABDOMEN PELVIS W CONTRAST    Standing Status:   Future    Standing Expiration Date:   05/07/2024    Order Specific Question:   If indicated for the ordered procedure, I authorize the administration of contrast media per Radiology protocol    Answer:   Yes    Order Specific Question:   Does the patient have a contrast media/X-ray dye  allergy?    Answer:   No    Order Specific Question:   Preferred imaging location?    Answer:   [facility]    Order Specific Question:   Release to patient    Answer:   Immediate    Order Specific Question:   If indicated for the ordered procedure, I authorize the administration of oral contrast media per Radiology protocol    Answer:   Yes   All questions were answered. The patient knows to call the clinic with any problems, questions or concerns. No barriers to learning was detected. The total time spent in the appointment was 25 minutes.     [name], MD 05/08/2023   Carolin Coy, CMA, am acting as scribe for [name], MD.   I have reviewed the above documentation for accuracy and completeness, and I agree with the above.

## 2023-05-08 NOTE — Patient Instructions (Signed)
Implanted [facility] Guide An implanted port is a device that is placed under the skin. It is usually placed in the chest. The device may vary based on the need. Implanted ports can be used to give IV medicine, to take blood, or to give fluids. You may have an implanted port if: You need IV medicine that would be irritating to the small veins in your hands or arms. You need IV medicines, such as chemotherapy, for a long period of time. You need IV nutrition for a long period of time. You may have fewer limitations when using a port than you would if you used other types of long-term IVs. You will also likely be able to return to normal activities after your incision heals. An implanted port has two main parts: Reservoir. The reservoir is the part where a needle is inserted to give medicines or draw blood. The reservoir is round. After the port is placed, it appears as a small, raised area under your skin. Catheter. The catheter is a small, thin tube that connects the reservoir to a vein. Medicine that is inserted into the reservoir goes into the catheter and then into the vein. How is my port accessed? To access your port: A numbing cream may be placed on the skin over the port site. Your health care provider will put on a mask and sterile gloves. The skin over your port will be cleaned carefully with a germ-killing soap and allowed to dry. Your health care provider will gently pinch the port and insert a needle into it. Your health care provider will check for a blood return to make sure the port is in the vein and is still working (patent). If your port needs to remain accessed to get medicine continuously (constant infusion), your health care provider will place a clear bandage (dressing) over the needle site. The dressing and needle will need to be changed every week, or as told by your health care provider. What is flushing? Flushing helps keep the port working. Follow instructions from your  health care provider about how and when to flush the port. Ports are usually flushed with saline solution or a medicine called heparin. The need for flushing will depend on how the port is used: If the port is only used from time to time to give medicines or draw blood, the port may need to be flushed: Before and after medicines have been given. Before and after blood has been drawn. As part of routine maintenance. Flushing may be recommended every 4-6 weeks. If a constant infusion is running, the port may not need to be flushed. Throw away any syringes in a disposal container that is meant for sharp items (sharps container). You can buy a sharps container from a pharmacy, or you can make one by using an empty hard plastic bottle with a cover. How long will my port stay implanted? The port can stay in for as long as your health care provider thinks it is needed. When it is time for the port to come out, a surgery will be done to remove it. The surgery will be similar to the procedure that was done to put the port in. Follow these instructions at home: Caring for your port and port site Flush your port as told by your health care provider. If you need an infusion over several days, follow instructions from your health care provider about how to take care of your port site. Make sure you: Change your  dressing as told by your health care provider. Wash your hands with soap and water for at least 20 seconds before and after you change your dressing. If soap and water are not available, use alcohol-based hand sanitizer. Place any used dressings or infusion bags into a plastic bag. Throw that bag in the trash. Keep the dressing that covers the needle clean and dry. Do not get it wet. Do not use scissors or sharp objects near the infusion tubing. Keep any external tubes clamped, unless they are being used. Check your port site every day for signs of infection. Check for: Redness, swelling, or  pain. Fluid or blood. Warmth. Pus or a bad smell. Protect the skin around the port site. Avoid wearing bra straps that rub or irritate the site. Protect the skin around your port from seat belts. Place a soft pad over your chest if needed. Bathe or shower as told by your health care provider. The site may get wet as long as you are not actively receiving an infusion. General instructions  Return to your normal activities as told by your health care provider. Ask your health care provider what activities are safe for you. Carry a medical alert card or wear a medical alert bracelet at all times. This will let health care providers know that you have an implanted port in case of an emergency. Where to find more information American Cancer Society: www.cancer.org American Society of Clinical Oncology: www.cancer.net Contact a health care provider if: You have a fever or chills. You have redness, swelling, or pain at the port site. You have fluid or blood coming from your port site. Your incision feels warm to the touch. You have pus or a bad smell coming from the port site. Summary Implanted ports are usually placed in the chest for long-term IV access. Follow instructions from your health care provider about flushing the port and changing bandages (dressings). Take care of the area around your port by avoiding clothing that puts pressure on the area, and by watching for signs of infection. Protect the skin around your port from seat belts. Place a soft pad over your chest if needed. Contact a health care provider if you have a fever or you have redness, swelling, pain, fluid, or a bad smell at the port site. This information is not intended to replace advice given to you by your health care provider. Make sure you discuss any questions you have with your health care provider. Document Revised: 02/08/2021 Document Reviewed: 02/08/2021 Elsevier Patient Education  2024 [employer].

## 2023-05-10 ENCOUNTER — Other Ambulatory Visit: Payer: Self-pay

## 2023-05-10 ENCOUNTER — Inpatient Hospital Stay: Payer: BC Managed Care – PPO

## 2023-05-10 VITALS — BP 121/83 | HR 66 | Temp 98.8°F | Resp 18

## 2023-05-10 DIAGNOSIS — C250 Malignant neoplasm of head of pancreas: Secondary | ICD-10-CM | POA: Diagnosis not present

## 2023-05-10 DIAGNOSIS — C787 Secondary malignant neoplasm of liver and intrahepatic bile duct: Secondary | ICD-10-CM | POA: Diagnosis not present

## 2023-05-10 DIAGNOSIS — Z9221 Personal history of antineoplastic chemotherapy: Secondary | ICD-10-CM | POA: Diagnosis not present

## 2023-05-10 DIAGNOSIS — R978 Other abnormal tumor markers: Secondary | ICD-10-CM | POA: Diagnosis not present

## 2023-05-10 DIAGNOSIS — C259 Malignant neoplasm of pancreas, unspecified: Secondary | ICD-10-CM

## 2023-05-10 DIAGNOSIS — Z5111 Encounter for antineoplastic chemotherapy: Secondary | ICD-10-CM | POA: Diagnosis not present

## 2023-05-10 DIAGNOSIS — G620 Drug-induced polyneuropathy: Secondary | ICD-10-CM | POA: Diagnosis not present

## 2023-05-10 DIAGNOSIS — T451X5A Adverse effect of antineoplastic and immunosuppressive drugs, initial encounter: Secondary | ICD-10-CM | POA: Diagnosis not present

## 2023-05-10 MED ORDER — SODIUM CHLORIDE 0.9% FLUSH
10.0000 mL | INTRAVENOUS | Status: DC | PRN
  Administered 2023-05-10: 10 mL

## 2023-05-10 MED ORDER — HEPARIN SOD (PORK) LOCK FLUSH 100 UNIT/ML IV SOLN
500.0000 [IU] | Freq: Once | INTRAVENOUS | Status: AC | PRN
  Administered 2023-05-10: 500 [IU]

## 2023-05-21 MED FILL — Dexamethasone Sodium Phosphate Inj 100 MG/10ML: INTRAMUSCULAR | Qty: 1 | Status: AC

## 2023-05-22 ENCOUNTER — Inpatient Hospital Stay: Payer: BC Managed Care – PPO | Attending: Hematology

## 2023-05-22 ENCOUNTER — Inpatient Hospital Stay: Payer: BC Managed Care – PPO | Admitting: Hematology

## 2023-05-22 ENCOUNTER — Inpatient Hospital Stay: Payer: BC Managed Care – PPO

## 2023-05-22 ENCOUNTER — Other Ambulatory Visit: Payer: Self-pay

## 2023-05-22 VITALS — BP 121/82 | HR 73 | Temp 98.7°F | Resp 17 | Ht 72.0 in | Wt 241.2 lb

## 2023-05-22 DIAGNOSIS — C787 Secondary malignant neoplasm of liver and intrahepatic bile duct: Secondary | ICD-10-CM | POA: Insufficient documentation

## 2023-05-22 DIAGNOSIS — Z452 Encounter for adjustment and management of vascular access device: Secondary | ICD-10-CM | POA: Insufficient documentation

## 2023-05-22 DIAGNOSIS — D649 Anemia, unspecified: Secondary | ICD-10-CM | POA: Insufficient documentation

## 2023-05-22 DIAGNOSIS — C259 Malignant neoplasm of pancreas, unspecified: Secondary | ICD-10-CM

## 2023-05-22 DIAGNOSIS — Z5111 Encounter for antineoplastic chemotherapy: Secondary | ICD-10-CM | POA: Insufficient documentation

## 2023-05-22 DIAGNOSIS — K521 Toxic gastroenteritis and colitis: Secondary | ICD-10-CM | POA: Insufficient documentation

## 2023-05-22 DIAGNOSIS — E876 Hypokalemia: Secondary | ICD-10-CM | POA: Diagnosis not present

## 2023-05-22 DIAGNOSIS — K5900 Constipation, unspecified: Secondary | ICD-10-CM | POA: Diagnosis not present

## 2023-05-22 DIAGNOSIS — K649 Unspecified hemorrhoids: Secondary | ICD-10-CM | POA: Diagnosis not present

## 2023-05-22 DIAGNOSIS — G620 Drug-induced polyneuropathy: Secondary | ICD-10-CM | POA: Diagnosis not present

## 2023-05-22 DIAGNOSIS — C250 Malignant neoplasm of head of pancreas: Secondary | ICD-10-CM | POA: Insufficient documentation

## 2023-05-22 DIAGNOSIS — Z95828 Presence of other vascular implants and grafts: Secondary | ICD-10-CM

## 2023-05-22 LAB — CMP (CANCER CENTER ONLY)
ALT: 17 U/L (ref 0–44)
AST: 16 U/L (ref 15–41)
Albumin: 3.5 g/dL (ref 3.5–5.0)
Alkaline Phosphatase: 71 U/L (ref 38–126)
Anion gap: 7 (ref 5–15)
BUN: 6 mg/dL (ref 6–20)
CO2: 27 mmol/L (ref 22–32)
Calcium: 8.6 mg/dL — ABNORMAL LOW (ref 8.9–10.3)
Chloride: 105 mmol/L (ref 98–111)
Creatinine: 0.76 mg/dL (ref 0.61–1.24)
GFR, Estimated: >60 mL/min
Glucose, Bld: 137 mg/dL — ABNORMAL HIGH (ref 70–99)
Potassium: 3 mmol/L — ABNORMAL LOW (ref 3.5–5.1)
Sodium: 139 mmol/L (ref 135–145)
Total Bilirubin: 0.5 mg/dL (ref 0.3–1.2)
Total Protein: 5.9 g/dL — ABNORMAL LOW (ref 6.5–8.1)

## 2023-05-22 LAB — CBC WITH DIFFERENTIAL (CANCER CENTER ONLY)
Abs Immature Granulocytes: 0.03 10*3/uL (ref 0.00–0.07)
Basophils Absolute: 0.1 10*3/uL (ref 0.0–0.1)
Basophils Relative: 1 %
Eosinophils Absolute: 0.7 10*3/uL — ABNORMAL HIGH (ref 0.0–0.5)
Eosinophils Relative: 10 %
HCT: 32.7 % — ABNORMAL LOW (ref 39.0–52.0)
Hemoglobin: 11.4 g/dL — ABNORMAL LOW (ref 13.0–17.0)
Immature Granulocytes: 0 %
Lymphocytes Relative: 32 %
Lymphs Abs: 2.3 10*3/uL (ref 0.7–4.0)
MCH: 30.9 pg (ref 26.0–34.0)
MCHC: 34.9 g/dL (ref 30.0–36.0)
MCV: 88.6 fL (ref 80.0–100.0)
Monocytes Absolute: 0.6 10*3/uL (ref 0.1–1.0)
Monocytes Relative: 8 %
Neutro Abs: 3.5 10*3/uL (ref 1.7–7.7)
Neutrophils Relative %: 49 %
Platelet Count: 281 10*3/uL (ref 150–400)
RBC: 3.69 MIL/uL — ABNORMAL LOW (ref 4.22–5.81)
RDW: 14.8 % (ref 11.5–15.5)
WBC Count: 7.2 10*3/uL (ref 4.0–10.5)
nRBC: 0 % (ref 0.0–0.2)

## 2023-05-22 MED ORDER — SODIUM CHLORIDE 0.9 % IV SOLN
400.0000 mg/m2 | Freq: Once | INTRAVENOUS | Status: AC
  Administered 2023-05-22: 980 mg via INTRAVENOUS
  Filled 2023-05-22: qty 49

## 2023-05-22 MED ORDER — SODIUM CHLORIDE 0.9 % IV SOLN: Freq: Once | INTRAVENOUS | Status: AC

## 2023-05-22 MED ORDER — SODIUM CHLORIDE 0.9% FLUSH
10.0000 mL | Freq: Once | INTRAVENOUS | Status: AC
  Administered 2023-05-22: 10 mL

## 2023-05-22 MED ORDER — SODIUM CHLORIDE 0.9 % IV SOLN
2400.0000 mg/m2 | INTRAVENOUS | Status: DC
  Administered 2023-05-22: 5900 mg via INTRAVENOUS
  Filled 2023-05-22: qty 118

## 2023-05-22 MED ORDER — SODIUM CHLORIDE 0.9 % IV SOLN
60.0000 mg/m2 | Freq: Once | INTRAVENOUS | Status: AC
  Administered 2023-05-22: 146.2 mg via INTRAVENOUS
  Filled 2023-05-22: qty 34

## 2023-05-22 MED ORDER — PALONOSETRON HCL INJECTION 0.25 MG/5ML
0.2500 mg | Freq: Once | INTRAVENOUS | Status: AC
  Administered 2023-05-22: 0.25 mg via INTRAVENOUS
  Filled 2023-05-22: qty 5

## 2023-05-22 MED ORDER — ATROPINE SULFATE 1 MG/ML IV SOLN
0.5000 mg | Freq: Once | INTRAVENOUS | Status: AC | PRN
  Administered 2023-05-22: 0.5 mg via INTRAVENOUS
  Filled 2023-05-22: qty 1

## 2023-05-22 MED ORDER — SODIUM CHLORIDE 0.9 % IV SOLN
10.0000 mg | Freq: Once | INTRAVENOUS | Status: AC
  Administered 2023-05-22: 10 mg via INTRAVENOUS
  Filled 2023-05-22: qty 10

## 2023-05-22 NOTE — Patient Instructions (Signed)
[facility]  Discharge Instructions: Thank you for choosing [facility] to provide your oncology and hematology care.   If you have a lab appointment with the [facility], please go directly to the [facility] and check in at the registration area.   Wear comfortable clothing and clothing appropriate for easy access to any Portacath or PICC line.   We strive to give you quality time with your provider. You may need to reschedule your appointment if you arrive late (15 or more minutes).  Arriving late affects you and other patients whose appointments are after yours.  Also, if you miss three or more appointments without notifying the office, you may be dismissed from the clinic at the provider's discretion.      For prescription refill requests, have your pharmacy contact our office and allow 72 hours for refills to be completed.    Today you received the following chemotherapy and/or immunotherapy agents: Liposomal Irinotecan, Leucovorin, Fluorouracil.       To help prevent nausea and vomiting after your treatment, we encourage you to take your nausea medication as directed.  BELOW ARE SYMPTOMS THAT SHOULD BE REPORTED IMMEDIATELY: *FEVER GREATER THAN 100.4 F (38 C) OR HIGHER *CHILLS OR SWEATING *NAUSEA AND VOMITING THAT IS NOT CONTROLLED WITH YOUR NAUSEA MEDICATION *UNUSUAL SHORTNESS OF BREATH *UNUSUAL BRUISING OR BLEEDING *URINARY PROBLEMS (pain or burning when urinating, or frequent urination) *BOWEL PROBLEMS (unusual diarrhea, constipation, pain near the anus) TENDERNESS IN MOUTH AND THROAT WITH OR WITHOUT PRESENCE OF ULCERS (sore throat, sores in mouth, or a toothache) UNUSUAL RASH, SWELLING OR PAIN  UNUSUAL VAGINAL DISCHARGE OR ITCHING   Items with * indicate a potential emergency and should be followed up as soon as possible or go to the Emergency Department if any problems should occur.  Please show the CHEMOTHERAPY ALERT  CARD or IMMUNOTHERAPY ALERT CARD at check-in to the Emergency Department and triage nurse.  Should you have questions after your visit or need to cancel or reschedule your appointment, please contact [facility]  Dept: [phone]  and follow the prompts.  Office hours are 8:00 a.m. to 4:30 p.m. Monday - Friday. Please note that voicemails left after 4:00 p.m. may not be returned until the following business day.  We are closed weekends and major holidays. You have access to a nurse at all times for urgent questions. Please call the main number to the clinic Dept: [phone] and follow the prompts.   For any non-urgent questions, you may also contact your provider using MyChart. We now offer e-Visits for anyone 52 and older to request care online for non-urgent symptoms. For details visit [email or web address].   Also download the MyChart app! Go to the app store, search "MyChart", open the app, select [facility], and log in with your MyChart username and password.

## 2023-05-22 NOTE — Assessment & Plan Note (Signed)
metastatic to liver, stage IV, KRAS G12R (+), MMR normal  -incidental finding on lung cancer screening chest CT on 01/19/22. 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  -He began first-line palliative FOLFIRINOX on 03/13/22, but unfortunately did not respond well. CA 19-9 also rose on treatment. -he switched to second line gemcitabine/abraxane on 06/05/22. He tolerates moderate well and has continued to work full time. CA 19-9 has dropped on treatment. -restaging CT from 08/24/22 showed partial response to treatment, improved primary tumor, and liver/node mets, I discussed with him  -Pt was referred to see Dr. [name] on 10/10/2022 , surgery was discussed but not felt to be a surgical candidate due to his multiple liver metastasis (5) -Restaging CT scan on November 24, 2022 showed moderate disease progression in liver and the pancreas, his tumor marker also increased.  I have changed his chemotherapy to 5-FU and liposomal irinotecan, started on 12/04/22, he is tolerating well  -Restaging scan from March 06, 2023 showed slightly decreased pancreatic mass, but he has slight progression in liver metastasis.  Has been trending up lately, also indicating a mild progression. -We discussed there is very limited treatment option at this point, I would recommend him to try docetaxel, gemcitabine and capecitabine as next line therapy. -pt is clinically doing well, given the limited treatment options, we decided to continue current therapy until he has further disease progression. -I reviewed his Guardant360 results, which is negative for targetable mutations  -will continue chemo, plan to repeat CT on 10/10

## 2023-05-22 NOTE — Progress Notes (Signed)
Per Secure Chat from [name] w/Revenue Cycle Team on 05/21/2023, pt's insurance did not approve for pt's CT CAP w/contrast which is scheduled on 05/31/2023 at [facility].  Delice Bison stated "I think his plan changed. its now pending [facility] facility. He may have to switch to someone else. I will keep you posted."  This nurse f/u with Delice Bison today regarding Secure Chat sent on 05/21/2023.  Delice Bison f/u with insurance and the CT Scan was denied; therefore, submission for approval at [facility] was submitted to [employer] which was approved.  Pt was notified that his insurance did not approval CT Scan to be done at [facility] but instead approved at [facility].  Stated someone from [facility] will be contacting the pt to get him scheduled for the CT Scan.  Pt verbalized understanding and had no further questions or concerns.    New order for CT CAP w/contrast for [facility] was sent with fax confirmation received.

## 2023-05-22 NOTE — Progress Notes (Signed)
[facility]   Telephone:([phone] Fax:([phone]) [phone]   Clinic Follow up Note   Patient Care Team: [name], MD as PCP - General (Family Medicine) [name], MD as Consulting Physician (Oncology) Tito Dine., MD as Consulting Physician (General Surgery) [facility] as Radiologist (Radiology)  Date of Service:  05/22/2023  CHIEF COMPLAINT: f/u of metastatic pancreatic cancer  CURRENT THERAPY:  5-FU and liposomal irinotecan every 2 weeks  Oncology History   Pancreatic cancer metastasized to liver [facility]) metastatic to liver, stage IV, KRAS G12R (+), MMR normal  -incidental finding on lung cancer screening chest CT on 01/19/22. 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  -He began first-line palliative FOLFIRINOX on 03/13/22, but unfortunately did not respond well. CA 19-9 also rose on treatment. -he switched to second line gemcitabine/abraxane on 06/05/22. He tolerates moderate well and has continued to work full time. CA 19-9 has dropped on treatment. -restaging CT from 08/24/22 showed partial response to treatment, improved primary tumor, and liver/node mets, I discussed with him  -Pt was referred to see Dr. [name] on 10/10/2022 , surgery was discussed but not felt to be a surgical candidate due to his multiple liver metastasis (5) -Restaging CT scan on November 24, 2022 showed moderate disease progression in liver and the pancreas, his tumor marker also increased.  I have changed his chemotherapy to 5-FU and liposomal irinotecan, started on 12/04/22, he is tolerating well  -Restaging scan from March 06, 2023 showed slightly decreased pancreatic mass, but he has slight progression in liver metastasis.  Has been trending up lately, also indicating a mild progression. -We discussed there is very limited treatment option at this point, I  would recommend him to try docetaxel, gemcitabine and capecitabine as next line therapy. -pt is clinically doing well, given the limited treatment options, we decided to continue current therapy until he has further disease progression. -I reviewed his Guardant360 results, which is negative for targetable mutations  -will continue chemo, plan to repeat CT on 10/10     Assessment and Plan    Metastatic Pancreatic Cancer Tolerating chemotherapy well with less vomiting and diarrhea this cycle, but increased fatigue. Still able to work, albeit not full time. No new symptoms. Hemoglobin stable at 11.4. -Continue current chemotherapy regimen. -CT scan scheduled for 05/31/2023 to assess disease status. -Review scan results in two weeks. -Consider chemotherapy break around the holiday if disease remains stable and patient continues to tolerate treatment.  Chemotherapy-Induced Peripheral Neuropathy Stable, not worsening. -Continue to monitor.  Chemotherapy-Induced Nausea and Vomiting Improved this cycle, no vomiting reported. -No changes to antiemetic regimen needed at this time.  Chemotherapy-Induced Diarrhea Improved this cycle, no significant diarrhea reported. -No changes to management needed at this time.  Hypokalemia Patient self-managing with potassium supplementation as needed based on lab results. -Continue current management.  General Health Maintenance -Continue AmBisome cream as needed. -Continue to monitor blood counts and other labs.      PLAN -Lab reviewed, adequate for treatment, will proceed chemotherapy today -Restaging CT scan scheduled for next week -Lab and follow-up in 2 weeks   SUMMARY OF ONCOLOGIC HISTORY: Oncology History Overview Note   Cancer Staging  Pancreatic cancer metastasized to liver [facility]) Staging form: Exocrine Pancreas, AJCC 8th Edition - Clinical stage from 02/28/2022: Stage IV (cT2, cN1, pM1) - Signed by [name], MD on 03/02/2022 Stage  prefix: Initial diagnosis     Pancreatic cancer metastasized to liver [facility])  02/28/2022 Cancer Staging   Staging form: Exocrine Pancreas, AJCC 8th Edition - Clinical stage from 02/28/2022: Stage IV (cT2, cN1, pM1) - Signed by [name], MD on 03/02/2022 Stage prefix: Initial diagnosis   03/02/2022 Initial Diagnosis   Pancreatic cancer metastasized to liver (HCC)   03/13/2022 - 04/13/2022 Chemotherapy   Patient is on Treatment Plan : PANCREAS Modified FOLFIRINOX q14d x 4 cycles     03/13/2022 - 05/17/2022 Chemotherapy   Patient is on Treatment Plan : PANCREAS Modified FOLFIRINOX q14d x 4 cycles     03/18/2022 Genetic Testing   Negative genetic testing on the CancerNext-Expanded+RNAinsight panel.  APC [id number] VUs identified.  The report date is March 18, 2022.  The CancerNext-Expanded gene panel offered by [facility] and includes sequencing and rearrangement analysis for the following 77 genes: AIP, ALK, APC*, ATM*, AXIN2, BAP1, BARD1, BLM, BMPR1A, BRCA1*, BRCA2*, BRIP1*, CDC73, CDH1*, CDK4, CDKN1B, CDKN2A, CHEK2*, CTNNA1, DICER1, FANCC, FH, FLCN, GALNT12, KIF1B, LZTR1, MAX, MEN1, MET, MLH1*, MSH2*, MSH3, MSH6*, MUTYH*, NBN, NF1*, NF2, NTHL1, PALB2*, PHOX2B, PMS2*, POT1, PRKAR1A, PTCH1, PTEN*, RAD51C*, RAD51D*, RB1, RECQL, RET, SDHA, SDHAF2, SDHB, SDHC, SDHD, SMAD4, SMARCA4, SMARCB1, SMARCE1, STK11, SUFU, TMEM127, TP53*, TSC1, TSC2, VHL and XRCC2 (sequencing and deletion/duplication); EGFR, EGLN1, HOXB13, KIT, MITF, PDGFRA, POLD1, and POLE (sequencing only); EPCAM and GREM1 (deletion/duplication only). DNA and RNA analyses performed for * genes.    05/25/2022 Progression   Rising CA 19-9, CT CAP IMPRESSION: 1. Multiple new and enlarged hypodense, rim enhancing liver metastases. 2. Unchanged, ill-defined hypoenhancing mass of the pancreatic uncinate, consistent with pancreatic adenocarcinoma 3. Unchanged small prominent portacaval and gastrohepatic ligament nodes, modestly suspicious for nodal  metastases. No overtly enlarged lymph nodes in the abdomen or pelvis. 4. Prostatomegaly.    06/05/2022 - 11/13/2022 Chemotherapy   Patient is on Treatment Plan : PANCREATIC Abraxane D1,8,15 + Gemcitabine D1,8,15 q28d     11/24/2022 Imaging   IMPRESSION: 1. Today's study demonstrates mild progression of disease as evidenced by slight enlargement of the mass in the uncinate process of the pancreas which demonstrates potential direct invasion into the superior wall of the third portion of the duodenum, but currently demonstrates no definite vascular involvement. Multiple hepatic lesions are generally stable to the prior study, with exception of enlargement of a lesion in segment 8, as detailed above. 2. No new sites of metastatic disease are noted elsewhere in the chest or pelvis. 3. Aortic atherosclerosis.  Rising CA 19-9    12/04/2022 -  Chemotherapy   Patient is on Treatment Plan : PANCREAS Liposomal Irinotecan + Leucovorin + 5-FU IVCI q14d     03/06/2023 Imaging    IMPRESSION: 1. Mass in the pancreatic head demonstrates decreased size since prior study. 2. Multiple liver metastasis are mildly increased in size. No new lesions. 3. 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 Metastatic disease would be less likely. 4. Mild aortic atherosclerosis. 5. Nonspecific sclerotic foci in the pelvis, unchanged, likely benign bone islands.        Discussed the use of AI scribe software for clinical note transcription with the patient, who gave verbal consent to proceed.  History of Present Illness   The patient, a 57 year old with metastatic pancreatic cancer, presents for a routine follow-up. He is currently undergoing chemotherapy and report feeling 'about the same' since his last  visit. He notes that he did not experience vomiting after his last  chemotherapy session, a marked improvement from the previous session when he vomited once a day for three days. However, he felt 'a little more tired' this time, but did not have severe diarrhea or vomiting. Despite these symptoms, he continues to work, albeit not every day, and comes home early if he feels unwell. He denies any new symptoms, fever, or chills.  The patient also reports using Ambodium cream and occasionally taking potassium supplements when his levels are low. He denies any increase in numbness or tingling.         All other systems were reviewed with the patient and are negative.  MEDICAL HISTORY:  Past Medical History:  Diagnosis Date   Family history of adverse reaction to anesthesia    mother allergic to ether    Family history of breast cancer    Family history of colon cancer    Pneumonia    hx of 2014     SURGICAL HISTORY: Past Surgical History:  Procedure Laterality Date   APPENDECTOMY     CHOLECYSTECTOMY N/A 07/12/2018   Procedure: LAPAROSCOPIC CHOLECYSTECTOMY WITH INTRAOPERATIVE CHOLANGIOGRAM;  Surgeon: Luretha Murphy, MD;  Location: [facility]S;  Service: General;  Laterality: N/A;   left wrist surgery      has plate in arm    NASAL POLYP SURGERY  2017   PORTACATH PLACEMENT N/A 03/10/2022   Procedure: INSERTION PORT-A-CATH;  Surgeon: Fritzi Mandes, MD;  Location: [facility] OR;  Service: General;  Laterality: N/A;   VENTRAL HERNIA REPAIR N/A 07/15/2014   Procedure: LAPAROSCOPIC REPAIR INCARCARTED UMBILICAL  VENTRAL HERNIA, ;  Surgeon: Claud Kelp, MD;  Location: [facility] ORS;  Service: General;  Laterality: N/A;  With MESH    I have reviewed the social history and family history with the patient and they are unchanged from previous note.  ALLERGIES:  has No Known Allergies.  MEDICATIONS:  Current Outpatient Medications  Medication Sig Dispense Refill   KLOR-CON M20 20 MEQ tablet TAKE 1 TABLET BY MOUTH 2 TIMES A DAY FOR 5 DAYS THEN TAKE 1 TABLET DAILY 35 tablet 0    lidocaine-prilocaine (EMLA) cream Apply to affected area once 30 g 3   lipase/protease/amylase (CREON) 36000 UNITS CPEP capsule Take 2 capsules (72,000 Units total) by mouth 3 (three) times daily with meals. May also take 1 capsule (36,000 Units total) as needed (with snacks). 240 capsule 3   loperamide (IMODIUM A-D) 2 MG tablet Take 2 mg by mouth 4 (four) times daily as needed for diarrhea or loose stools.     ondansetron (ZOFRAN) 8 MG tablet Take 1 tablet (8 mg total) by mouth every 8 (eight) hours as needed for nausea or vomiting. Start on the third day after irinotecan (Patient not taking: Reported on 04/10/2023) 30 tablet 1   pantoprazole (PROTONIX) 40 MG tablet Take 1 tablet (40 mg total) by mouth daily. 30 tablet 1   prochlorperazine (COMPAZINE) 10 MG tablet Take 1 tablet (10 mg total) by mouth every 6 (six) hours as needed for nausea or vomiting. (Patient not taking: Reported on 04/10/2023) 30 tablet 1   valACYclovir (VALTREX) 500 MG tablet Take 1 tablet (500 mg total) by mouth 2 (two) times daily. 60 tablet 3   No current facility-administered medications for this visit.   Facility-Administered Medications Ordered in Other Visits  Medication Dose Route Frequency Provider Last Rate Last Admin   fluorouracil (ADRUCIL) 5,900 mg in sodium chloride 0.9 %  132 mL chemo infusion  2,400 mg/m2 (Treatment Plan Recorded) Intravenous 1 day or 1 dose [name], MD       leucovorin 980 mg in sodium chloride 0.9 % 250 mL infusion  400 mg/m2 (Treatment Plan Recorded) Intravenous Once [name], MD 598 mL/hr at 05/22/23 1401 980 mg at 05/22/23 1401    PHYSICAL EXAMINATION: ECOG PERFORMANCE STATUS: 1 - Symptomatic but completely ambulatory  Vitals:   05/22/23 1040  BP: 121/82  Pulse: 73  Resp: 17  Temp: 98.7 F (37.1 C)  SpO2: 96%   Wt Readings from Last 3 Encounters:  05/22/23 241 lb 3.2 oz (109.4 kg)  05/08/23 241 lb 4.8 oz (109.5 kg)  04/25/23 241 lb (109.3 kg)     GENERAL:alert, no  distress and comfortable SKIN: skin color, texture, turgor are normal, no rashes or significant lesions EYES: normal, Conjunctiva are pink and non-injected, sclera clear Musculoskeletal:no cyanosis of digits and no clubbing  NEURO: alert & oriented x 3 with fluent speech, no focal motor/sensory deficits   LABORATORY DATA:  I have reviewed the data as listed    Latest Ref Rng & Units 05/22/2023   10:13 AM 05/08/2023   10:11 AM 04/25/2023    8:36 AM  CBC  WBC 4.0 - 10.5 K/uL 7.2  8.1  10.0   Hemoglobin 13.0 - 17.0 g/dL 78.2  95.6  21.3   Hematocrit 39.0 - 52.0 % 32.7  35.1  36.0   Platelets 150 - 400 K/uL 281  236  333         Latest Ref Rng & Units 05/22/2023   10:13 AM 05/08/2023   10:11 AM 04/25/2023    8:36 AM  CMP  Glucose 70 - 99 mg/dL 086  578  469   BUN 6 - 20 mg/dL 6  10  8    Creatinine 0.61 - 1.24 mg/dL 6.29  5.28  4.13   Sodium 135 - 145 mmol/L 139  138  140   Potassium 3.5 - 5.1 mmol/L 3.0  3.6  3.8   Chloride 98 - 111 mmol/L 105  106  107   CO2 22 - 32 mmol/L 27  25  26    Calcium 8.9 - 10.3 mg/dL 8.6  8.8  8.9   Total Protein 6.5 - 8.1 g/dL 5.9  6.5  6.6   Total Bilirubin 0.3 - 1.2 mg/dL 0.5  0.5  0.5   Alkaline Phos 38 - 126 U/L 71  69  75   AST 15 - 41 U/L 16  17  21    ALT 0 - 44 U/L 17  18  18        RADIOGRAPHIC STUDIES: I have personally reviewed the radiological images as listed and agreed with the findings in the report. No results found.    No orders of the defined types were placed in this encounter.  All questions were answered. The patient knows to call the clinic with any problems, questions or concerns. No barriers to learning was detected. The total time spent in the appointment was 25 minutes.     [name], MD 05/22/2023

## 2023-05-23 LAB — CANCER ANTIGEN 19-9: CA 19-9: 68 U/mL — ABNORMAL HIGH (ref 0–35)

## 2023-05-24 ENCOUNTER — Inpatient Hospital Stay: Payer: BC Managed Care – PPO

## 2023-05-24 VITALS — BP 109/80 | HR 60 | Temp 98.6°F | Resp 18

## 2023-05-24 DIAGNOSIS — C250 Malignant neoplasm of head of pancreas: Secondary | ICD-10-CM | POA: Diagnosis not present

## 2023-05-24 DIAGNOSIS — D649 Anemia, unspecified: Secondary | ICD-10-CM | POA: Diagnosis not present

## 2023-05-24 DIAGNOSIS — Z452 Encounter for adjustment and management of vascular access device: Secondary | ICD-10-CM | POA: Diagnosis not present

## 2023-05-24 DIAGNOSIS — G620 Drug-induced polyneuropathy: Secondary | ICD-10-CM | POA: Diagnosis not present

## 2023-05-24 DIAGNOSIS — K5900 Constipation, unspecified: Secondary | ICD-10-CM | POA: Diagnosis not present

## 2023-05-24 DIAGNOSIS — K649 Unspecified hemorrhoids: Secondary | ICD-10-CM | POA: Diagnosis not present

## 2023-05-24 DIAGNOSIS — C787 Secondary malignant neoplasm of liver and intrahepatic bile duct: Secondary | ICD-10-CM | POA: Diagnosis not present

## 2023-05-24 DIAGNOSIS — E876 Hypokalemia: Secondary | ICD-10-CM | POA: Diagnosis not present

## 2023-05-24 DIAGNOSIS — Z5111 Encounter for antineoplastic chemotherapy: Secondary | ICD-10-CM | POA: Diagnosis not present

## 2023-05-24 DIAGNOSIS — K521 Toxic gastroenteritis and colitis: Secondary | ICD-10-CM | POA: Diagnosis not present

## 2023-05-24 DIAGNOSIS — C259 Malignant neoplasm of pancreas, unspecified: Secondary | ICD-10-CM

## 2023-05-24 MED ORDER — HEPARIN SOD (PORK) LOCK FLUSH 100 UNIT/ML IV SOLN
500.0000 [IU] | Freq: Once | INTRAVENOUS | Status: AC | PRN
  Administered 2023-05-24: 500 [IU]

## 2023-05-24 MED ORDER — SODIUM CHLORIDE 0.9% FLUSH
10.0000 mL | INTRAVENOUS | Status: DC | PRN
  Administered 2023-05-24: 10 mL

## 2023-05-28 ENCOUNTER — Telehealth: Payer: Self-pay

## 2023-05-28 ENCOUNTER — Other Ambulatory Visit: Payer: Self-pay

## 2023-05-28 MED ORDER — POTASSIUM CHLORIDE CRYS ER 20 MEQ PO TBCR: EXTENDED_RELEASE_TABLET | ORAL | 0 refills | Status: DC

## 2023-05-28 NOTE — Telephone Encounter (Signed)
-----   Message from [name] sent at 05/28/2023  8:13 AM EDT ----- Please let pt know his K was low last week, and let him take KCL daily for a week, thx   [name]

## 2023-05-28 NOTE — Telephone Encounter (Signed)
Notified pt of message below. Per  VERBAL order from Dr. Mosetta Putt with readback, pt is to take  KCL twice a day for 1 week and 1 tablet daily. Pt verbalize understanding. New prescription sent to reflect new order.  Chau Savell M,CMA

## 2023-05-30 ENCOUNTER — Ambulatory Visit
Admission: RE | Admit: 2023-05-30 | Discharge: 2023-05-30 | Disposition: A | Discharge status: Home / Self Care | Payer: BC Managed Care – PPO | Source: Ambulatory Visit | Attending: Hematology | Admitting: Hematology

## 2023-05-30 DIAGNOSIS — I2699 Other pulmonary embolism without acute cor pulmonale: Secondary | ICD-10-CM | POA: Diagnosis not present

## 2023-05-30 DIAGNOSIS — C787 Secondary malignant neoplasm of liver and intrahepatic bile duct: Secondary | ICD-10-CM | POA: Diagnosis not present

## 2023-05-30 DIAGNOSIS — C259 Malignant neoplasm of pancreas, unspecified: Secondary | ICD-10-CM

## 2023-05-30 DIAGNOSIS — K449 Diaphragmatic hernia without obstruction or gangrene: Secondary | ICD-10-CM | POA: Diagnosis not present

## 2023-05-30 DIAGNOSIS — R590 Localized enlarged lymph nodes: Secondary | ICD-10-CM | POA: Diagnosis not present

## 2023-05-30 MED ORDER — IOPAMIDOL (ISOVUE-300) INJECTION 61%
100.0000 mL | Freq: Once | INTRAVENOUS | Status: AC | PRN
  Administered 2023-05-30: 100 mL via INTRAVENOUS

## 2023-05-31 ENCOUNTER — Ambulatory Visit (HOSPITAL_COMMUNITY): Payer: BC Managed Care – PPO

## 2023-06-01 MED FILL — Dexamethasone Sodium Phosphate Inj 100 MG/10ML: INTRAMUSCULAR | Qty: 1 | Status: AC

## 2023-06-04 ENCOUNTER — Inpatient Hospital Stay: Payer: BC Managed Care – PPO

## 2023-06-04 ENCOUNTER — Other Ambulatory Visit: Payer: Self-pay

## 2023-06-04 ENCOUNTER — Inpatient Hospital Stay: Payer: BC Managed Care – PPO | Admitting: Hematology

## 2023-06-04 VITALS — BP 115/75 | HR 70 | Temp 98.0°F | Resp 15 | Ht 72.0 in | Wt 237.6 lb

## 2023-06-04 DIAGNOSIS — C259 Malignant neoplasm of pancreas, unspecified: Secondary | ICD-10-CM

## 2023-06-04 DIAGNOSIS — D649 Anemia, unspecified: Secondary | ICD-10-CM | POA: Diagnosis not present

## 2023-06-04 DIAGNOSIS — K649 Unspecified hemorrhoids: Secondary | ICD-10-CM | POA: Diagnosis not present

## 2023-06-04 DIAGNOSIS — K5900 Constipation, unspecified: Secondary | ICD-10-CM | POA: Diagnosis not present

## 2023-06-04 DIAGNOSIS — K521 Toxic gastroenteritis and colitis: Secondary | ICD-10-CM | POA: Diagnosis not present

## 2023-06-04 DIAGNOSIS — Z95828 Presence of other vascular implants and grafts: Secondary | ICD-10-CM

## 2023-06-04 DIAGNOSIS — E876 Hypokalemia: Secondary | ICD-10-CM | POA: Diagnosis not present

## 2023-06-04 DIAGNOSIS — G620 Drug-induced polyneuropathy: Secondary | ICD-10-CM | POA: Diagnosis not present

## 2023-06-04 DIAGNOSIS — C250 Malignant neoplasm of head of pancreas: Secondary | ICD-10-CM | POA: Diagnosis not present

## 2023-06-04 DIAGNOSIS — Z452 Encounter for adjustment and management of vascular access device: Secondary | ICD-10-CM | POA: Diagnosis not present

## 2023-06-04 DIAGNOSIS — Z5111 Encounter for antineoplastic chemotherapy: Secondary | ICD-10-CM | POA: Diagnosis not present

## 2023-06-04 DIAGNOSIS — C787 Secondary malignant neoplasm of liver and intrahepatic bile duct: Secondary | ICD-10-CM | POA: Diagnosis not present

## 2023-06-04 LAB — CBC WITH DIFFERENTIAL (CANCER CENTER ONLY)
Abs Immature Granulocytes: 0.05 10*3/uL (ref 0.00–0.07)
Basophils Absolute: 0.1 10*3/uL (ref 0.0–0.1)
Basophils Relative: 1 %
Eosinophils Absolute: 0.6 10*3/uL — ABNORMAL HIGH (ref 0.0–0.5)
Eosinophils Relative: 9 %
HCT: 30.3 % — ABNORMAL LOW (ref 39.0–52.0)
Hemoglobin: 10.2 g/dL — ABNORMAL LOW (ref 13.0–17.0)
Immature Granulocytes: 1 %
Lymphocytes Relative: 32 %
Lymphs Abs: 1.9 10*3/uL (ref 0.7–4.0)
MCH: 30.7 pg (ref 26.0–34.0)
MCHC: 33.7 g/dL (ref 30.0–36.0)
MCV: 91.3 fL (ref 80.0–100.0)
Monocytes Absolute: 0.4 10*3/uL (ref 0.1–1.0)
Monocytes Relative: 7 %
Neutro Abs: 3 10*3/uL (ref 1.7–7.7)
Neutrophils Relative %: 50 %
Platelet Count: 150 10*3/uL (ref 150–400)
RBC: 3.32 MIL/uL — ABNORMAL LOW (ref 4.22–5.81)
RDW: 15.3 % (ref 11.5–15.5)
WBC Count: 6 10*3/uL (ref 4.0–10.5)
nRBC: 0 % (ref 0.0–0.2)

## 2023-06-04 LAB — CMP (CANCER CENTER ONLY)
ALT: 21 U/L (ref 0–44)
AST: 18 U/L (ref 15–41)
Albumin: 3.6 g/dL (ref 3.5–5.0)
Alkaline Phosphatase: 72 U/L (ref 38–126)
Anion gap: 8 (ref 5–15)
BUN: 8 mg/dL (ref 6–20)
CO2: 24 mmol/L (ref 22–32)
Calcium: 8.9 mg/dL (ref 8.9–10.3)
Chloride: 106 mmol/L (ref 98–111)
Creatinine: 0.87 mg/dL (ref 0.61–1.24)
GFR, Estimated: >60 mL/min (ref >60)
Glucose, Bld: 96 mg/dL (ref 70–99)
Potassium: 3.8 mmol/L (ref 3.5–5.1)
Sodium: 138 mmol/L (ref 135–145)
Total Bilirubin: 0.6 mg/dL (ref 0.3–1.2)
Total Protein: 6.2 g/dL — ABNORMAL LOW (ref 6.5–8.1)

## 2023-06-04 MED ORDER — SODIUM CHLORIDE 0.9 % IV SOLN: Freq: Once | INTRAVENOUS | Status: AC

## 2023-06-04 MED ORDER — LIDOCAINE-PRILOCAINE 2.5-2.5 % EX CREA: TOPICAL_CREAM | CUTANEOUS | 3 refills | Status: DC

## 2023-06-04 MED ORDER — SODIUM CHLORIDE 0.9 % IV SOLN
60.0000 mg/m2 | Freq: Once | INTRAVENOUS | Status: AC
  Administered 2023-06-04: 146.2 mg via INTRAVENOUS
  Filled 2023-06-04: qty 34

## 2023-06-04 MED ORDER — ATROPINE SULFATE 1 MG/ML IV SOLN
0.5000 mg | Freq: Once | INTRAVENOUS | Status: AC | PRN
  Administered 2023-06-04: 0.5 mg via INTRAVENOUS
  Filled 2023-06-04: qty 1

## 2023-06-04 MED ORDER — SODIUM CHLORIDE 0.9 % IV SOLN
2400.0000 mg/m2 | INTRAVENOUS | Status: DC
  Administered 2023-06-04: 5900 mg via INTRAVENOUS
  Filled 2023-06-04: qty 118

## 2023-06-04 MED ORDER — SODIUM CHLORIDE 0.9 % IV SOLN
400.0000 mg/m2 | Freq: Once | INTRAVENOUS | Status: AC
  Administered 2023-06-04: 980 mg via INTRAVENOUS
  Filled 2023-06-04: qty 49

## 2023-06-04 MED ORDER — SODIUM CHLORIDE 0.9% FLUSH
10.0000 mL | Freq: Once | INTRAVENOUS | Status: AC
  Administered 2023-06-04: 10 mL

## 2023-06-04 MED ORDER — PALONOSETRON HCL INJECTION 0.25 MG/5ML
0.2500 mg | Freq: Once | INTRAVENOUS | Status: AC
  Administered 2023-06-04: 0.25 mg via INTRAVENOUS
  Filled 2023-06-04: qty 5

## 2023-06-04 MED ORDER — SODIUM CHLORIDE 0.9 % IV SOLN
10.0000 mg | Freq: Once | INTRAVENOUS | Status: AC
  Administered 2023-06-04: 10 mg via INTRAVENOUS
  Filled 2023-06-04: qty 10

## 2023-06-04 NOTE — Patient Instructions (Signed)
[place] [facility]  Discharge Instructions: Thank you for choosing [facility] to provide your oncology and hematology care.   If you have a lab appointment with the Cancer Center, please go directly to the Cancer Center and check in at the registration area.   Wear comfortable clothing and clothing appropriate for easy access to any Portacath or PICC line.   We strive to give you quality time with your provider. You may need to reschedule your appointment if you arrive late (15 or more minutes).  Arriving late affects you and other patients whose appointments are after yours.  Also, if you miss three or more appointments without notifying the office, you may be dismissed from the clinic at the provider's discretion.      For prescription refill requests, have your pharmacy contact our office and allow 72 hours for refills to be completed.    Today you received the following chemotherapy and/or immunotherapy agents: Liposomal Irinotecan, Leucovorin, Fluorouracil.       To help prevent nausea and vomiting after your treatment, we encourage you to take your nausea medication as directed.  BELOW ARE SYMPTOMS THAT SHOULD BE REPORTED IMMEDIATELY: *FEVER GREATER THAN 100.4 F (38 C) OR HIGHER *CHILLS OR SWEATING *NAUSEA AND VOMITING THAT IS NOT CONTROLLED WITH YOUR NAUSEA MEDICATION *UNUSUAL SHORTNESS OF BREATH *UNUSUAL BRUISING OR BLEEDING *URINARY PROBLEMS (pain or burning when urinating, or frequent urination) *BOWEL PROBLEMS (unusual diarrhea, constipation, pain near the anus) TENDERNESS IN MOUTH AND THROAT WITH OR WITHOUT PRESENCE OF ULCERS (sore throat, sores in mouth, or a toothache) UNUSUAL RASH, SWELLING OR PAIN  UNUSUAL VAGINAL DISCHARGE OR ITCHING   Items with * indicate a potential emergency and should be followed up as soon as possible or go to the Emergency Department if any problems should occur.  Please show the CHEMOTHERAPY ALERT  CARD or IMMUNOTHERAPY ALERT CARD at check-in to the Emergency Department and triage nurse.  Should you have questions after your visit or need to cancel or reschedule your appointment, please contact [place] [facility]  Dept: [phone]  and follow the prompts.  Office hours are 8:00 a.m. to 4:30 p.m. Monday - Friday. Please note that voicemails left after 4:00 p.m. may not be returned until the following business day.  We are closed weekends and major holidays. You have access to a nurse at all times for urgent questions. Please call the main number to the clinic Dept: [phone] and follow the prompts.   For any non-urgent questions, you may also contact your provider using MyChart. We now offer e-Visits for anyone 42 and older to request care online for non-urgent symptoms. For details visit [email or web address].   Also download the MyChart app! Go to the app store, search "MyChart", open the app, select [place], and log in with your MyChart username and password.

## 2023-06-04 NOTE — Progress Notes (Signed)
[facility]   Telephone:(336) [phone] Fax:(336) [phone]   Clinic Follow up Note   Patient Care Team: [name], MD as PCP - General (Family Medicine) [name], MD as Consulting Physician (Oncology) [name], Revonda Standard., MD as Consulting Physician (General Surgery) [facility] as Radiologist (Radiology)  Date of Service:  06/04/2023  CHIEF COMPLAINT: f/u of metastatic pancreatic cancer  CURRENT THERAPY:  FOLFIRI with liposomal irinotecan  Oncology History   Pancreatic cancer metastasized to liver [facility]) metastatic to liver, stage IV, KRAS G12R (+), MMR normal  -incidental finding on lung cancer screening chest CT on 01/19/22. 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  -He began first-line palliative FOLFIRINOX on 03/13/22, but unfortunately did not respond well. CA 19-9 also rose on treatment. -he switched to second line gemcitabine/abraxane on 06/05/22. He tolerates moderate well and has continued to work full time. CA 19-9 has dropped on treatment. -restaging CT from 08/24/22 showed partial response to treatment, improved primary tumor, and liver/node mets, I discussed with him  -Pt was referred to see Dr. [name] on 10/10/2022 , surgery was discussed but not felt to be a surgical candidate due to his multiple liver metastasis (5) -Restaging CT scan on November 24, 2022 showed moderate disease progression in liver and the pancreas, his tumor marker also increased.  I have changed his chemotherapy to 5-FU and liposomal irinotecan, started on 12/04/22, he is tolerating well  -Restaging scan from March 06, 2023 showed slightly decreased pancreatic mass, but he has slight progression in liver metastasis.  Has been trending up lately, also indicating a mild progression. -We discussed there is very limited treatment option at this point, I would  recommend him to try docetaxel, gemcitabine and capecitabine as next line therapy. -pt is clinically doing well, given the limited treatment options, we decided to continue current therapy until he has further disease progression. -I reviewed his Guardant360 results, which is negative for targetable mutations  -will continue chemo, plan to repeat CT on 10/10   Assessment and Plan    Metastatic Pancreatic Cancer Undergoing chemotherapy with recent CT scan pending radiology interpretation. Preliminary review suggests stable disease. Tumor marker slightly elevated but within similar range. -Continue current chemotherapy regimen. -Check my chart for CT scan results once released.  Anemia Hemoglobin decreased from 11-12 to 10.2. No symptoms of anemia reported. -Monitor hemoglobin levels. -Encourage high protein diet.  Oral Health Complaint of sore on tongue possibly related to broken tooth. No signs of oral thrush observed. -Encourage regular dental hygiene with alcohol-free mouthwash. -Consider dental consultation for broken tooth.  Gastrointestinal Health Reports of constipation and hemorrhoid development. Some intolerance to certain foods noted. -Encourage high fiber diet and adequate hydration. -Provide refill for cream for port. -Consider use of Creon for digestion if symptoms persist.  Plan -Lab and CT scan reviewed, adequate for treatment, will proceed chemotherapy today at the same dose -Will call him when the CT report is back -Follow-up in 2 weeks before next cycle chemo.         SUMMARY OF ONCOLOGIC HISTORY: Oncology History Overview Note   Cancer Staging  Pancreatic cancer metastasized to liver [facility]) Staging form: Exocrine Pancreas, AJCC 8th Edition - Clinical stage from 02/28/2022: Stage IV (cT2, cN1, pM1) - Signed by [name], MD on 03/02/2022 Stage prefix: Initial diagnosis     Pancreatic cancer metastasized to liver (  HCC)  02/28/2022 Cancer Staging   Staging  form: Exocrine Pancreas, AJCC 8th Edition - Clinical stage from 02/28/2022: Stage IV (cT2, cN1, pM1) - Signed by [name], MD on 03/02/2022 Stage prefix: Initial diagnosis   03/02/2022 Initial Diagnosis   Pancreatic cancer metastasized to liver (HCC)   03/13/2022 - 04/13/2022 Chemotherapy   Patient is on Treatment Plan : PANCREAS Modified FOLFIRINOX q14d x 4 cycles     03/13/2022 - 05/17/2022 Chemotherapy   Patient is on Treatment Plan : PANCREAS Modified FOLFIRINOX q14d x 4 cycles     03/18/2022 Genetic Testing   Negative genetic testing on the CancerNext-Expanded+RNAinsight panel.  APC [id number] VUs identified.  The report date is March 18, 2022.  The CancerNext-Expanded gene panel offered by [facility] and includes sequencing and rearrangement analysis for the following 77 genes: AIP, ALK, APC*, ATM*, AXIN2, BAP1, BARD1, BLM, BMPR1A, BRCA1*, BRCA2*, BRIP1*, CDC73, CDH1*, CDK4, CDKN1B, CDKN2A, CHEK2*, CTNNA1, DICER1, FANCC, FH, FLCN, GALNT12, KIF1B, LZTR1, MAX, MEN1, MET, MLH1*, MSH2*, MSH3, MSH6*, MUTYH*, NBN, NF1*, NF2, NTHL1, PALB2*, PHOX2B, PMS2*, POT1, PRKAR1A, PTCH1, PTEN*, RAD51C*, RAD51D*, RB1, RECQL, RET, SDHA, SDHAF2, SDHB, SDHC, SDHD, SMAD4, SMARCA4, SMARCB1, SMARCE1, STK11, SUFU, TMEM127, TP53*, TSC1, TSC2, VHL and XRCC2 (sequencing and deletion/duplication); EGFR, EGLN1, HOXB13, KIT, MITF, PDGFRA, POLD1, and POLE (sequencing only); EPCAM and GREM1 (deletion/duplication only). DNA and RNA analyses performed for * genes.    05/25/2022 Progression   Rising CA 19-9, CT CAP IMPRESSION: 1. Multiple new and enlarged hypodense, rim enhancing liver metastases. 2. Unchanged, ill-defined hypoenhancing mass of the pancreatic uncinate, consistent with pancreatic adenocarcinoma 3. Unchanged small prominent portacaval and gastrohepatic ligament nodes, modestly suspicious for nodal metastases. No overtly enlarged lymph nodes in the abdomen or pelvis. 4. Prostatomegaly.    06/05/2022 - 11/13/2022  Chemotherapy   Patient is on Treatment Plan : PANCREATIC Abraxane D1,8,15 + Gemcitabine D1,8,15 q28d     11/24/2022 Imaging   IMPRESSION: 1. Today's study demonstrates mild progression of disease as evidenced by slight enlargement of the mass in the uncinate process of the pancreas which demonstrates potential direct invasion into the superior wall of the third portion of the duodenum, but currently demonstrates no definite vascular involvement. Multiple hepatic lesions are generally stable to the prior study, with exception of enlargement of a lesion in segment 8, as detailed above. 2. No new sites of metastatic disease are noted elsewhere in the chest or pelvis. 3. Aortic atherosclerosis.  Rising CA 19-9    12/04/2022 -  Chemotherapy   Patient is on Treatment Plan : PANCREAS Liposomal Irinotecan + Leucovorin + 5-FU IVCI q14d     03/06/2023 Imaging    IMPRESSION: 1. Mass in the pancreatic head demonstrates decreased size since prior study. 2. Multiple liver metastasis are mildly increased in size. No new lesions. 3. 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 Metastatic disease would be less likely. 4. Mild aortic atherosclerosis. 5. Nonspecific sclerotic foci in the pelvis, unchanged, likely benign bone islands.        Discussed the use of AI scribe software for clinical note transcription with the patient, who gave verbal consent to proceed.  History of Present Illness   The patient, a 57 year old with metastatic pancreatic cancer, presents for a follow-up visit after a recent cycle of chemotherapy. He reports no new symptoms since the last visit. The patient mentions a sore on the tongue, which he  attributes to a broken tooth. He also reports a change in dietary habits, with a decreased intake of meat and protein. The patient experienced  constipation and a hemorrhoid after the last or penultimate chemotherapy cycle but had less diarrhea this time. He also mentions a spot on the tongue, which he believes is due to a broken tooth. The patient has noticed an increased intolerance to sugar, with symptoms of discomfort after consuming pizza and an orange soda.         All other systems were reviewed with the patient and are negative.  MEDICAL HISTORY:  Past Medical History:  Diagnosis Date   Family history of adverse reaction to anesthesia    mother allergic to ether    Family history of breast cancer    Family history of colon cancer    Pneumonia    hx of 2014     SURGICAL HISTORY: Past Surgical History:  Procedure Laterality Date   APPENDECTOMY     CHOLECYSTECTOMY N/A 07/12/2018   Procedure: LAPAROSCOPIC CHOLECYSTECTOMY WITH INTRAOPERATIVE CHOLANGIOGRAM;  Surgeon: Luretha Murphy, MD;  Location: [facility] ORS;  Service: General;  Laterality: N/A;   left wrist surgery      has plate in arm    NASAL POLYP SURGERY  2017   PORTACATH PLACEMENT N/A 03/10/2022   Procedure: INSERTION PORT-A-CATH;  Surgeon: Fritzi Mandes, MD;  Location: [facility];  Service: General;  Laterality: N/A;   VENTRAL HERNIA REPAIR N/A 07/15/2014   Procedure: LAPAROSCOPIC REPAIR INCARCARTED UMBILICAL  VENTRAL HERNIA, ;  Surgeon: Claud Kelp, MD;  Location: [facility] ORS;  Service: General;  Laterality: N/A;  With MESH    I have reviewed the social history and family history with the patient and they are unchanged from previous note.  ALLERGIES:  has No Known Allergies.  MEDICATIONS:  Current Outpatient Medications  Medication Sig Dispense Refill   lidocaine-prilocaine (EMLA) cream Apply to affected area once 30 g 3   lipase/protease/amylase (CREON) 36000 UNITS CPEP capsule Take 2 capsules (72,000 Units total) by mouth 3 (three) times daily with meals. May also take 1 capsule (36,000 Units total) as needed (with snacks). 240 capsule 3   loperamide (IMODIUM  A-D) 2 MG tablet Take 2 mg by mouth 4 (four) times daily as needed for diarrhea or loose stools.     ondansetron (ZOFRAN) 8 MG tablet Take 1 tablet (8 mg total) by mouth every 8 (eight) hours as needed for nausea or vomiting. Start on the third day after irinotecan (Patient not taking: Reported on 04/10/2023) 30 tablet 1   pantoprazole (PROTONIX) 40 MG tablet Take 1 tablet (40 mg total) by mouth daily. 30 tablet 1   potassium chloride SA (KLOR-CON M20) 20 MEQ tablet Take 1 tablet (20 mEq total) by mouth 2 (two) times daily AND 1 tablet (20 mEq total) daily. Take 1 tab (20 meq) 2x daily for 7 days and then take 1 tab (20 meq) daily for remainder of the month. 37 tablet 0   prochlorperazine (COMPAZINE) 10 MG tablet Take 1 tablet (10 mg total) by mouth every 6 (six) hours as needed for nausea or vomiting. (Patient not taking: Reported on 04/10/2023) 30 tablet 1   valACYclovir (VALTREX) 500 MG tablet Take 1 tablet (500 mg total) by mouth 2 (two) times daily. 60 tablet 3   No current facility-administered medications for this visit.   Facility-Administered Medications Ordered in Other Visits  Medication Dose Route Frequency Provider Last Rate Last Admin   fluorouracil (ADRUCIL)  5,900 mg in sodium chloride 0.9 % 132 mL chemo infusion  2,400 mg/m2 (Treatment Plan Recorded) Intravenous 1 day or 1 dose [name], MD       irinotecan LIPOSOME (ONIVYDE) 146.2 mg in sodium chloride 0.9 % 500 mL chemo infusion  60 mg/m2 (Treatment Plan Recorded) Intravenous Once [name], MD 356 mL/hr at 06/04/23 1341 146.2 mg at 06/04/23 1341    PHYSICAL EXAMINATION: ECOG PERFORMANCE STATUS: 1 - Symptomatic but completely ambulatory  Vitals:   06/04/23 1112  BP: 115/75  Pulse: 70  Resp: 15  Temp: 98 F (36.7 C)  SpO2: 97%   Wt Readings from Last 3 Encounters:  06/04/23 237 lb 9.6 oz (107.8 kg)  05/22/23 241 lb 3.2 oz (109.4 kg)  05/08/23 241 lb 4.8 oz (109.5 kg)     GENERAL:alert, no distress and  comfortable SKIN: skin color, texture, turgor are normal, no rashes or significant lesions EYES: normal, Conjunctiva are pink and non-injected, sclera clear NECK: supple, thyroid normal size, non-tender, without nodularity LYMPH:  no palpable lymphadenopathy in the cervical, axillary  LUNGS: clear to auscultation and percussion with normal breathing effort HEART: regular rate & rhythm and no murmurs and no lower extremity edema ABDOMEN:abdomen soft, non-tender and normal bowel sounds Musculoskeletal:no cyanosis of digits and no clubbing  NEURO: alert & oriented x 3 with fluent speech, no focal motor/sensory deficits   LABORATORY DATA:  I have reviewed the data as listed    Latest Ref Rng & Units 06/04/2023   10:38 AM 05/22/2023   10:13 AM 05/08/2023   10:11 AM  CBC  WBC 4.0 - 10.5 K/uL 6.0  7.2  8.1   Hemoglobin 13.0 - 17.0 g/dL 16.1  09.6  04.5   Hematocrit 39.0 - 52.0 % 30.3  32.7  35.1   Platelets 150 - 400 K/uL 150  281  236         Latest Ref Rng & Units 06/04/2023   12:20 PM 05/22/2023   10:13 AM 05/08/2023   10:11 AM  CMP  Glucose 70 - 99 mg/dL 96  409  811   BUN 6 - 20 mg/dL 8  6  10    Creatinine 0.61 - 1.24 mg/dL 9.14  7.82  9.56   Sodium 135 - 145 mmol/L 138  139  138   Potassium 3.5 - 5.1 mmol/L 3.8  3.0  3.6   Chloride 98 - 111 mmol/L 106  105  106   CO2 22 - 32 mmol/L 24  27  25    Calcium 8.9 - 10.3 mg/dL 8.9  8.6  8.8   Total Protein 6.5 - 8.1 g/dL 6.2  5.9  6.5   Total Bilirubin 0.3 - 1.2 mg/dL 0.6  0.5  0.5   Alkaline Phos 38 - 126 U/L 72  71  69   AST 15 - 41 U/L 18  16  17    ALT 0 - 44 U/L 21  17  18        RADIOGRAPHIC STUDIES: I have personally reviewed the radiological images as listed and agreed with the findings in the report. No results found.    No orders of the defined types were placed in this encounter.  All questions were answered. The patient knows to call the clinic with any problems, questions or concerns. No barriers to learning was  detected. The total time spent in the appointment was 30 minutes.     [name], MD 06/04/2023

## 2023-06-04 NOTE — Assessment & Plan Note (Signed)
metastatic to liver, stage IV, KRAS G12R (+), MMR normal  -incidental finding on lung cancer screening chest CT on 01/19/22. 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  -He began first-line palliative FOLFIRINOX on 03/13/22, but unfortunately did not respond well. CA 19-9 also rose on treatment. -he switched to second line gemcitabine/abraxane on 06/05/22. He tolerates moderate well and has continued to work full time. CA 19-9 has dropped on treatment. -restaging CT from 08/24/22 showed partial response to treatment, improved primary tumor, and liver/node mets, I discussed with him  -Pt was referred to see Dr. [name] on 10/10/2022 , surgery was discussed but not felt to be a surgical candidate due to his multiple liver metastasis (5) -Restaging CT scan on November 24, 2022 showed moderate disease progression in liver and the pancreas, his tumor marker also increased.  I have changed his chemotherapy to 5-FU and liposomal irinotecan, started on 12/04/22, he is tolerating well  -Restaging scan from March 06, 2023 showed slightly decreased pancreatic mass, but he has slight progression in liver metastasis.  Has been trending up lately, also indicating a mild progression. -We discussed there is very limited treatment option at this point, I would recommend him to try docetaxel, gemcitabine and capecitabine as next line therapy. -pt is clinically doing well, given the limited treatment options, we decided to continue current therapy until he has further disease progression. -I reviewed his Guardant360 results, which is negative for targetable mutations  -will continue chemo, plan to repeat CT on 10/10

## 2023-06-04 NOTE — Progress Notes (Addendum)
Per lab, need to redraw CMP and CA19, labs redrawn. Per Dr Mosetta Putt ok to proceed with treatment while waiting for lab results.

## 2023-06-05 DIAGNOSIS — C787 Secondary malignant neoplasm of liver and intrahepatic bile duct: Secondary | ICD-10-CM | POA: Diagnosis not present

## 2023-06-05 DIAGNOSIS — C259 Malignant neoplasm of pancreas, unspecified: Secondary | ICD-10-CM | POA: Diagnosis not present

## 2023-06-05 LAB — CANCER ANTIGEN 19-9: CA 19-9: 76 U/mL — ABNORMAL HIGH (ref 0–35)

## 2023-06-06 ENCOUNTER — Inpatient Hospital Stay: Payer: BC Managed Care – PPO

## 2023-06-06 VITALS — BP 116/85 | HR 63 | Temp 98.8°F | Resp 16

## 2023-06-06 DIAGNOSIS — K5900 Constipation, unspecified: Secondary | ICD-10-CM | POA: Diagnosis not present

## 2023-06-06 DIAGNOSIS — C787 Secondary malignant neoplasm of liver and intrahepatic bile duct: Secondary | ICD-10-CM | POA: Diagnosis not present

## 2023-06-06 DIAGNOSIS — Z5111 Encounter for antineoplastic chemotherapy: Secondary | ICD-10-CM | POA: Diagnosis not present

## 2023-06-06 DIAGNOSIS — K521 Toxic gastroenteritis and colitis: Secondary | ICD-10-CM | POA: Diagnosis not present

## 2023-06-06 DIAGNOSIS — K649 Unspecified hemorrhoids: Secondary | ICD-10-CM | POA: Diagnosis not present

## 2023-06-06 DIAGNOSIS — D649 Anemia, unspecified: Secondary | ICD-10-CM | POA: Diagnosis not present

## 2023-06-06 DIAGNOSIS — Z452 Encounter for adjustment and management of vascular access device: Secondary | ICD-10-CM | POA: Diagnosis not present

## 2023-06-06 DIAGNOSIS — G620 Drug-induced polyneuropathy: Secondary | ICD-10-CM | POA: Diagnosis not present

## 2023-06-06 DIAGNOSIS — C259 Malignant neoplasm of pancreas, unspecified: Secondary | ICD-10-CM

## 2023-06-06 DIAGNOSIS — C250 Malignant neoplasm of head of pancreas: Secondary | ICD-10-CM | POA: Diagnosis not present

## 2023-06-06 DIAGNOSIS — E876 Hypokalemia: Secondary | ICD-10-CM | POA: Diagnosis not present

## 2023-06-06 MED ORDER — HEPARIN SOD (PORK) LOCK FLUSH 100 UNIT/ML IV SOLN
500.0000 [IU] | Freq: Once | INTRAVENOUS | Status: AC | PRN
  Administered 2023-06-06: 500 [IU]

## 2023-06-06 MED ORDER — SODIUM CHLORIDE 0.9% FLUSH
10.0000 mL | INTRAVENOUS | Status: DC | PRN
  Administered 2023-06-06: 10 mL

## 2023-06-07 ENCOUNTER — Encounter: Payer: Self-pay | Admitting: Hematology

## 2023-06-08 ENCOUNTER — Other Ambulatory Visit: Payer: Self-pay | Admitting: Oncology

## 2023-06-08 DIAGNOSIS — I2699 Other pulmonary embolism without acute cor pulmonale: Secondary | ICD-10-CM

## 2023-06-08 MED ORDER — APIXABAN (ELIQUIS) VTE STARTER PACK (10MG AND 5MG)
ORAL_TABLET | ORAL | 0 refills | Status: DC
Start: 2023-06-08 — End: 2023-07-02

## 2023-06-08 NOTE — Progress Notes (Signed)
Today I received a phone call from Dr. Cleone Slim, radiology regarding [name].  Patient of Dr. Mosetta Putt with stage IV pancreatic cancer with liver metastatic disease, currently on palliative chemotherapy with FOLFIRI with liposomal irinotecan.  He had restaging CT chest, abdomen and pelvis with IV contrast on 05/30/2023.  He did see Dr. Mosetta Putt for follow-up in our clinic on 06/04/2023 and received chemotherapy that day.  Restaging scan results were pending and they just got resulted today.  CT scan incidentally showed bilateral lower lobe pulmonary emboli, most proximal involving left lower lobe segmental branches.  Pancreatic mass has not changed in size.  I called patient with CT results.  He has been feeling fine without any new symptoms.  He denied any chest pain, shortness of breath, dizziness, focal weakness, palpitations or other concerning symptoms.  Understandably he does not feel like he needs to go to the emergency room because of this CT finding.  He even tolerated chemotherapy well this week.  Discussed need to start him on anticoagulation and patient is agreeable.  Prescription sent to his pharmacy for Eliquis 10 mg twice daily orally for 7 days, to be followed by 5 mg twice daily indefinitely.  Patient verbalized understanding of the directions for anticoagulation and its importance.  Also discussed that he needs to go to the nearest emergency room in case of any chest pain, shortness of breath, dizziness or other symptoms that were mentioned above.  Patient verbalized understanding.  I am covering for Dr. Mosetta Putt over the weekend.  I will forward this message to her for eventual follow-up.

## 2023-06-11 ENCOUNTER — Telehealth: Payer: Self-pay | Admitting: *Deleted

## 2023-06-11 NOTE — Telephone Encounter (Signed)
Telephone call to patient in regards to positive CT scan for PE on Friday. Patient is taking Eliquis starting last Saturday. Patient reports no side effects, he has been taking it daily. He understands to continue.  Patient states no increase or decrease of respiratory symptoms. He has been experiencing SOB for the past 2 months with exertion. He reports this has been his baseline since starting treatment. He feels like "he hastily ran a mile" while only walking up a flight of stairs and this is normal for him.   Strongly encouraged patient to call office if these symptoms worsen or if he develops and new respiratory symptoms over the next week, confirming his appointment in office on 10/28 with Dr. Mosetta Putt. He verbalized an understanding.

## 2023-06-17 NOTE — Assessment & Plan Note (Signed)
metastatic to liver, stage IV, KRAS G12R (+), MMR normal  -incidental finding on lung cancer screening chest CT on 01/19/22. 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  -He began first-line palliative FOLFIRINOX on 03/13/22, but unfortunately did not respond well. CA 19-9 also rose on treatment. -he switched to second line gemcitabine/abraxane on 06/05/22. He tolerates moderate well and has continued to work full time. CA 19-9 has dropped on treatment. -restaging CT from 08/24/22 showed partial response to treatment, improved primary tumor, and liver/node mets, I discussed with him  -Pt was referred to see Dr. [name] on 10/10/2022 , surgery was discussed but not felt to be a surgical candidate due to his multiple liver metastasis (5) -Restaging CT scan on November 24, 2022 showed moderate disease progression in liver and the pancreas, his tumor marker also increased.  I have changed his chemotherapy to 5-FU and liposomal irinotecan, started on 12/04/22, he is tolerating well  -Restaging scan from March 06, 2023 showed slightly decreased pancreatic mass, but he has slight progression in liver metastasis.  Has been trending up lately, also indicating a mild progression. -We discussed there is very limited treatment option at this point, I would recommend him to try docetaxel, gemcitabine and capecitabine as next line therapy. -pt is clinically doing well, given the limited treatment options, we decided to continue current therapy until he has further disease progression. -I reviewed his Guardant360 results, which is negative for targetable mutations  -restaging CT showed overall stable disease, but probably a tiny new liver metastatic lesion.  His tumor marker has slightly increased lately.  Given the limited treatment options, I will continue his current therapy now

## 2023-06-18 ENCOUNTER — Inpatient Hospital Stay: Payer: BC Managed Care – PPO

## 2023-06-18 ENCOUNTER — Inpatient Hospital Stay: Payer: BC Managed Care – PPO | Admitting: Hematology

## 2023-06-18 VITALS — BP 117/76 | HR 71 | Temp 98.1°F | Resp 18 | Wt 235.7 lb

## 2023-06-18 DIAGNOSIS — D649 Anemia, unspecified: Secondary | ICD-10-CM | POA: Diagnosis not present

## 2023-06-18 DIAGNOSIS — C787 Secondary malignant neoplasm of liver and intrahepatic bile duct: Secondary | ICD-10-CM | POA: Diagnosis not present

## 2023-06-18 DIAGNOSIS — Z95828 Presence of other vascular implants and grafts: Secondary | ICD-10-CM

## 2023-06-18 DIAGNOSIS — C259 Malignant neoplasm of pancreas, unspecified: Secondary | ICD-10-CM

## 2023-06-18 DIAGNOSIS — E876 Hypokalemia: Secondary | ICD-10-CM | POA: Diagnosis not present

## 2023-06-18 DIAGNOSIS — K5900 Constipation, unspecified: Secondary | ICD-10-CM | POA: Diagnosis not present

## 2023-06-18 DIAGNOSIS — C250 Malignant neoplasm of head of pancreas: Secondary | ICD-10-CM | POA: Diagnosis not present

## 2023-06-18 DIAGNOSIS — K521 Toxic gastroenteritis and colitis: Secondary | ICD-10-CM | POA: Diagnosis not present

## 2023-06-18 DIAGNOSIS — G620 Drug-induced polyneuropathy: Secondary | ICD-10-CM | POA: Diagnosis not present

## 2023-06-18 DIAGNOSIS — Z5111 Encounter for antineoplastic chemotherapy: Secondary | ICD-10-CM | POA: Diagnosis not present

## 2023-06-18 DIAGNOSIS — Z452 Encounter for adjustment and management of vascular access device: Secondary | ICD-10-CM | POA: Diagnosis not present

## 2023-06-18 DIAGNOSIS — K649 Unspecified hemorrhoids: Secondary | ICD-10-CM | POA: Diagnosis not present

## 2023-06-18 LAB — CBC WITH DIFFERENTIAL (CANCER CENTER ONLY)
Abs Immature Granulocytes: 0.01 10*3/uL (ref 0.00–0.07)
Basophils Absolute: 0.1 10*3/uL (ref 0.0–0.1)
Basophils Relative: 1 %
Eosinophils Absolute: 0.7 10*3/uL — ABNORMAL HIGH (ref 0.0–0.5)
Eosinophils Relative: 12 %
HCT: 31.6 % — ABNORMAL LOW (ref 39.0–52.0)
Hemoglobin: 10.8 g/dL — ABNORMAL LOW (ref 13.0–17.0)
Immature Granulocytes: 0 %
Lymphocytes Relative: 33 %
Lymphs Abs: 2 10*3/uL (ref 0.7–4.0)
MCH: 30.6 pg (ref 26.0–34.0)
MCHC: 34.2 g/dL (ref 30.0–36.0)
MCV: 89.5 fL (ref 80.0–100.0)
Monocytes Absolute: 0.5 10*3/uL (ref 0.1–1.0)
Monocytes Relative: 9 %
Neutro Abs: 2.6 10*3/uL (ref 1.7–7.7)
Neutrophils Relative %: 45 %
Platelet Count: 296 10*3/uL (ref 150–400)
RBC: 3.53 MIL/uL — ABNORMAL LOW (ref 4.22–5.81)
RDW: 15.8 % — ABNORMAL HIGH (ref 11.5–15.5)
WBC Count: 5.9 10*3/uL (ref 4.0–10.5)
nRBC: 0 % (ref 0.0–0.2)

## 2023-06-18 LAB — CMP (CANCER CENTER ONLY)
ALT: 34 U/L (ref 0–44)
AST: 21 U/L (ref 15–41)
Albumin: 3.6 g/dL (ref 3.5–5.0)
Alkaline Phosphatase: 101 U/L (ref 38–126)
Anion gap: 6 (ref 5–15)
BUN: 9 mg/dL (ref 6–20)
CO2: 26 mmol/L (ref 22–32)
Calcium: 8.8 mg/dL — ABNORMAL LOW (ref 8.9–10.3)
Chloride: 105 mmol/L (ref 98–111)
Creatinine: 0.75 mg/dL (ref 0.61–1.24)
GFR, Estimated: >60 mL/min (ref >60)
Glucose, Bld: 105 mg/dL — ABNORMAL HIGH (ref 70–99)
Potassium: 3 mmol/L — ABNORMAL LOW (ref 3.5–5.1)
Sodium: 137 mmol/L (ref 135–145)
Total Bilirubin: 0.5 mg/dL (ref 0.3–1.2)
Total Protein: 6.1 g/dL — ABNORMAL LOW (ref 6.5–8.1)

## 2023-06-18 MED ORDER — SODIUM CHLORIDE 0.9 % IV SOLN: Freq: Once | INTRAVENOUS | Status: AC

## 2023-06-18 MED ORDER — SODIUM CHLORIDE 0.9 % IV SOLN
400.0000 mg/m2 | Freq: Once | INTRAVENOUS | Status: AC
  Administered 2023-06-18: 980 mg via INTRAVENOUS
  Filled 2023-06-18: qty 49

## 2023-06-18 MED ORDER — SODIUM CHLORIDE 0.9% FLUSH
10.0000 mL | INTRAVENOUS | Status: DC | PRN
Start: 2023-06-18 — End: 2023-06-18
  Administered 2023-06-18: 10 mL

## 2023-06-18 MED ORDER — APIXABAN 5 MG PO TABS: 5.0000 mg | ORAL_TABLET | Freq: Two times a day (BID) | ORAL | 3 refills | Status: DC

## 2023-06-18 MED ORDER — SODIUM CHLORIDE 0.9% FLUSH
10.0000 mL | Freq: Once | INTRAVENOUS | Status: AC
  Administered 2023-06-18: 10 mL

## 2023-06-18 MED ORDER — SODIUM CHLORIDE 0.9 % IV SOLN
60.0000 mg/m2 | Freq: Once | INTRAVENOUS | Status: AC
  Administered 2023-06-18: 146.2 mg via INTRAVENOUS
  Filled 2023-06-18: qty 34

## 2023-06-18 MED ORDER — PALONOSETRON HCL INJECTION 0.25 MG/5ML
0.2500 mg | Freq: Once | INTRAVENOUS | Status: AC
  Administered 2023-06-18: 0.25 mg via INTRAVENOUS
  Filled 2023-06-18: qty 5

## 2023-06-18 MED ORDER — DEXAMETHASONE SODIUM PHOSPHATE 10 MG/ML IJ SOLN
10.0000 mg | Freq: Once | INTRAMUSCULAR | Status: AC
  Administered 2023-06-18: 10 mg via INTRAVENOUS
  Filled 2023-06-18: qty 1

## 2023-06-18 MED ORDER — ATROPINE SULFATE 1 MG/ML IV SOLN
0.5000 mg | Freq: Once | INTRAVENOUS | Status: AC | PRN
  Administered 2023-06-18: 0.5 mg via INTRAVENOUS
  Filled 2023-06-18: qty 1

## 2023-06-18 MED ORDER — HEPARIN SOD (PORK) LOCK FLUSH 100 UNIT/ML IV SOLN
500.0000 [IU] | Freq: Once | INTRAVENOUS | Status: DC | PRN
Start: 2023-06-18 — End: 2023-06-18

## 2023-06-18 MED ORDER — SODIUM CHLORIDE 0.9 % IV SOLN
2400.0000 mg/m2 | INTRAVENOUS | Status: DC
  Administered 2023-06-18: 5900 mg via INTRAVENOUS
  Filled 2023-06-18: qty 118

## 2023-06-18 NOTE — Progress Notes (Signed)
[facility]   Telephone:(336) [phone] Fax:([phone]) [phone]   Clinic Follow up Note   Patient Care Team: [name], MD as PCP - General (Family Medicine) [name], MD as Consulting Physician (Oncology) Tito Dine., MD as Consulting Physician (General Surgery) [facility] as Radiologist (Radiology)  Date of Service:  06/18/2023  CHIEF COMPLAINT: f/u of metastatic pancreatic cancer  CURRENT THERAPY:  Liposomal irinotecan and 5-FU pump infusion every 2 weeks  Oncology History   Pancreatic cancer metastasized to liver [facility]) metastatic to liver, stage IV, KRAS G12R (+), MMR normal  -incidental finding on lung cancer screening chest CT on 01/19/22. 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  -He began first-line palliative FOLFIRINOX on 03/13/22, but unfortunately did not respond well. CA 19-9 also rose on treatment. -he switched to second line gemcitabine/abraxane on 06/05/22. He tolerates moderate well and has continued to work full time. CA 19-9 has dropped on treatment. -restaging CT from 08/24/22 showed partial response to treatment, improved primary tumor, and liver/node mets, I discussed with him  -Pt was referred to see Dr. [name] on 10/10/2022 , surgery was discussed but not felt to be a surgical candidate due to his multiple liver metastasis (5) -Restaging CT scan on November 24, 2022 showed moderate disease progression in liver and the pancreas, his tumor marker also increased.  I have changed his chemotherapy to 5-FU and liposomal irinotecan, started on 12/04/22, he is tolerating well  -Restaging scan from March 06, 2023 showed slightly decreased pancreatic mass, but he has slight progression in liver metastasis.  Has been trending up lately, also indicating a mild progression. -We discussed there is very limited treatment option at  this point, I would recommend him to try docetaxel, gemcitabine and capecitabine as next line therapy. -pt is clinically doing well, given the limited treatment options, we decided to continue current therapy until he has further disease progression. -I reviewed his Guardant360 results, which is negative for targetable mutations  -restaging CT showed overall stable disease, but probably a tiny new liver metastatic lesion.  His tumor marker has slightly increased lately.  Given the limited treatment options, I will continue his current therapy now    Assessment and Plan    Metastatic Pancreatic Cancer -I personally reviewed his restaging CT scan and discussed the findings with patient and his wife.  He has stable disease with a small new lesion in the liver. Tumor marker slightly elevated but not to the point of changing treatment. -Continue current chemotherapy regimen. -Reschedule chemotherapy sessions to accommodate patient's holiday plans. -Plan for a follow-up scan in 2-3 months depending on tumor marker levels.  Pulmonary Embolism Improvement in shortness of breath after initiation of Eliquis. No significant bleeding issues reported. -Continue Eliquis, currently on maintenance dose of 1 pill twice a day. -Refill prescription for Eliquis. -Advise against taking NSAIDs while on Eliquis.  Anemia Improvement noted in blood counts, possibly due to patient's self-initiated iron and vitamin B complex supplementation. -Continue iron and vitamin B complex supplementation.  Constipation Previous episode of constipation leading to hemorrhoidal discomfort. -Advise on the use of laxatives if constipation recurs.  Plan -Lab and CT scan reviewed, will proceed chemo treatment today and continue every 2 weeks -follow-up in 2     SUMMARY OF ONCOLOGIC HISTORY: Oncology History Overview Note   Cancer Staging  Pancreatic cancer metastasized to liver (  HCC) Staging form: Exocrine Pancreas, AJCC  8th Edition - Clinical stage from 02/28/2022: Stage IV (cT2, cN1, pM1) - Signed by [name], MD on 03/02/2022 Stage prefix: Initial diagnosis     Pancreatic cancer metastasized to liver [facility])  02/28/2022 Cancer Staging   Staging form: Exocrine Pancreas, AJCC 8th Edition - Clinical stage from 02/28/2022: Stage IV (cT2, cN1, pM1) - Signed by [name], MD on 03/02/2022 Stage prefix: Initial diagnosis   03/02/2022 Initial Diagnosis   Pancreatic cancer metastasized to liver (HCC)   03/13/2022 - 04/13/2022 Chemotherapy   Patient is on Treatment Plan : PANCREAS Modified FOLFIRINOX q14d x 4 cycles     03/13/2022 - 05/17/2022 Chemotherapy   Patient is on Treatment Plan : PANCREAS Modified FOLFIRINOX q14d x 4 cycles     03/18/2022 Genetic Testing   Negative genetic testing on the CancerNext-Expanded+RNAinsight panel.  APC [id number] VUs identified.  The report date is March 18, 2022.  The CancerNext-Expanded gene panel offered by [facility] and includes sequencing and rearrangement analysis for the following 77 genes: AIP, ALK, APC*, ATM*, AXIN2, BAP1, BARD1, BLM, BMPR1A, BRCA1*, BRCA2*, BRIP1*, CDC73, CDH1*, CDK4, CDKN1B, CDKN2A, CHEK2*, CTNNA1, DICER1, FANCC, FH, FLCN, GALNT12, KIF1B, LZTR1, MAX, MEN1, MET, MLH1*, MSH2*, MSH3, MSH6*, MUTYH*, NBN, NF1*, NF2, NTHL1, PALB2*, PHOX2B, PMS2*, POT1, PRKAR1A, PTCH1, PTEN*, RAD51C*, RAD51D*, RB1, RECQL, RET, SDHA, SDHAF2, SDHB, SDHC, SDHD, SMAD4, SMARCA4, SMARCB1, SMARCE1, STK11, SUFU, TMEM127, TP53*, TSC1, TSC2, VHL and XRCC2 (sequencing and deletion/duplication); EGFR, EGLN1, HOXB13, KIT, MITF, PDGFRA, POLD1, and POLE (sequencing only); EPCAM and GREM1 (deletion/duplication only). DNA and RNA analyses performed for * genes.    05/25/2022 Progression   Rising CA 19-9, CT CAP IMPRESSION: 1. Multiple new and enlarged hypodense, rim enhancing liver metastases. 2. Unchanged, ill-defined hypoenhancing mass of the pancreatic uncinate, consistent with pancreatic  adenocarcinoma 3. Unchanged small prominent portacaval and gastrohepatic ligament nodes, modestly suspicious for nodal metastases. No overtly enlarged lymph nodes in the abdomen or pelvis. 4. Prostatomegaly.    06/05/2022 - 11/13/2022 Chemotherapy   Patient is on Treatment Plan : PANCREATIC Abraxane D1,8,15 + Gemcitabine D1,8,15 q28d     11/24/2022 Imaging   IMPRESSION: 1. Today's study demonstrates mild progression of disease as evidenced by slight enlargement of the mass in the uncinate process of the pancreas which demonstrates potential direct invasion into the superior wall of the third portion of the duodenum, but currently demonstrates no definite vascular involvement. Multiple hepatic lesions are generally stable to the prior study, with exception of enlargement of a lesion in segment 8, as detailed above. 2. No new sites of metastatic disease are noted elsewhere in the chest or pelvis. 3. Aortic atherosclerosis.  Rising CA 19-9    12/04/2022 -  Chemotherapy   Patient is on Treatment Plan : PANCREAS Liposomal Irinotecan + Leucovorin + 5-FU IVCI q14d     03/06/2023 Imaging    IMPRESSION: 1. Mass in the pancreatic head demonstrates decreased size since prior study. 2. Multiple liver metastasis are mildly increased in size. No new lesions. 3. 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 Metastatic disease would be less likely. 4. Mild aortic atherosclerosis. 5. Nonspecific sclerotic foci in the pelvis, unchanged, likely benign bone islands.        Discussed the use of AI scribe software for clinical note transcription with the patient, who gave verbal consent to proceed.  History of Present Illness  A 57 year old patient with a history of metastatic pancreatic cancer presents for a follow-up visit. The patient has been experiencing shortness  of breath for the past two to three weeks, particularly when going upstairs. This symptom has improved since starting blood thinners about five to six days ago. The patient also reports working in a moldy environment due to his job in [employer]. There have been no bleeding issues with the blood thinner Eliquis, although the patient notes occasional blood when blowing his nose in the morning. The patient also reports rectal discomfort due to constipation and hemorrhoids, which was particularly severe about four weeks ago. The patient has been managing this with a foam treatment and baby wipes. The patient's bowel movements have been darker since starting the blood thinner and iron supplements. The patient also reports that his stools are often large and infrequent, particularly when he is not experiencing diarrhea.         All other systems were reviewed with the patient and are negative.  MEDICAL HISTORY:  Past Medical History:  Diagnosis Date   Family history of adverse reaction to anesthesia    mother allergic to ether    Family history of breast cancer    Family history of colon cancer    Pneumonia    hx of 2014     SURGICAL HISTORY: Past Surgical History:  Procedure Laterality Date   APPENDECTOMY     CHOLECYSTECTOMY N/A 07/12/2018   Procedure: LAPAROSCOPIC CHOLECYSTECTOMY WITH INTRAOPERATIVE CHOLANGIOGRAM;  Surgeon: Luretha Murphy, MD;  Location: [facility]S;  Service: General;  Laterality: N/A;   left wrist surgery      has plate in arm    NASAL POLYP SURGERY  2017   PORTACATH PLACEMENT N/A 03/10/2022   Procedure: INSERTION PORT-A-CATH;  Surgeon: Fritzi Mandes, MD;  Location: [facility] OR;  Service: General;  Laterality: N/A;   VENTRAL HERNIA REPAIR N/A 07/15/2014   Procedure: LAPAROSCOPIC REPAIR INCARCARTED UMBILICAL  VENTRAL HERNIA, ;  Surgeon: Claud Kelp, MD;  Location: [facility] ORS;  Service: General;  Laterality: N/A;  With MESH    I have reviewed the social history and family  history with the patient and they are unchanged from previous note.  ALLERGIES:  has No Known Allergies.  MEDICATIONS:  Current Outpatient Medications  Medication Sig Dispense Refill   apixaban (ELIQUIS) 5 MG TABS tablet Take 1 tablet (5 mg total) by mouth 2 (two) times daily. 60 tablet 3   APIXABAN (ELIQUIS) VTE STARTER PACK (10MG  AND 5MG ) Take as directed on package: start with two-5mg  tablets twice daily for 7 days. On day 8, switch to one-5mg  tablet twice daily. 74 each 0   lidocaine-prilocaine (EMLA) cream Apply to affected area once 30 g 3   lipase/protease/amylase (CREON) 36000 UNITS CPEP capsule Take 2 capsules (72,000 Units total) by mouth 3 (three) times daily with meals. May also take 1 capsule (36,000 Units total) as needed (with snacks). 240 capsule 3   loperamide (IMODIUM A-D) 2 MG tablet Take 2 mg by mouth 4 (four) times daily as needed for diarrhea or loose stools.     ondansetron (ZOFRAN) 8 MG tablet Take 1 tablet (8 mg total) by mouth every 8 (eight) hours as needed for nausea or vomiting. Start on the third day after irinotecan (Patient not taking: Reported on 04/10/2023) 30 tablet 1   pantoprazole (PROTONIX) 40 MG tablet Take 1 tablet (40 mg total) by mouth daily. 30 tablet 1   potassium chloride SA (KLOR-CON M20) 20  MEQ tablet Take 1 tablet (20 mEq total) by mouth 2 (two) times daily AND 1 tablet (20 mEq total) daily. Take 1 tab (20 meq) 2x daily for 7 days and then take 1 tab (20 meq) daily for remainder of the month. 37 tablet 0   prochlorperazine (COMPAZINE) 10 MG tablet Take 1 tablet (10 mg total) by mouth every 6 (six) hours as needed for nausea or vomiting. (Patient not taking: Reported on 04/10/2023) 30 tablet 1   valACYclovir (VALTREX) 500 MG tablet Take 1 tablet (500 mg total) by mouth 2 (two) times daily. 60 tablet 3   No current facility-administered medications for this visit.   Facility-Administered Medications Ordered in Other Visits  Medication Dose Route  Frequency Provider Last Rate Last Admin   fluorouracil (ADRUCIL) 5,900 mg in sodium chloride 0.9 % 132 mL chemo infusion  2,400 mg/m2 (Treatment Plan Recorded) Intravenous 1 day or 1 dose [name], MD   Infusion Verify at 06/18/23 1512   heparin lock flush 100 unit/mL  500 Units Intracatheter Once PRN [name], MD       sodium chloride flush (NS) 0.9 % injection 10 mL  10 mL Intracatheter PRN [name], MD   10 mL at 06/18/23 1501    PHYSICAL EXAMINATION: ECOG PERFORMANCE STATUS: 1 - Symptomatic but completely ambulatory  Vitals:   06/18/23 1103  BP: 117/76  Pulse: 71  Resp: 18  Temp: 98.1 F (36.7 C)  SpO2: 98%   Wt Readings from Last 3 Encounters:  06/18/23 235 lb 11.2 oz (106.9 kg)  06/04/23 237 lb 9.6 oz (107.8 kg)  05/22/23 241 lb 3.2 oz (109.4 kg)     GENERAL:alert, no distress and comfortable SKIN: skin color, texture, turgor are normal, no rashes or significant lesions EYES: normal, Conjunctiva are pink and non-injected, sclera clear NECK: supple, thyroid normal size, non-tender, without nodularity LYMPH:  no palpable lymphadenopathy in the cervical, axillary  LUNGS: clear to auscultation and percussion with normal breathing effort HEART: regular rate & rhythm and no murmurs and no lower extremity edema ABDOMEN:abdomen soft, non-tender and normal bowel sounds Musculoskeletal:no cyanosis of digits and no clubbing  NEURO: alert & oriented x 3 with fluent speech, no focal motor/sensory deficits    LABORATORY DATA:  I have reviewed the data as listed    Latest Ref Rng & Units 06/18/2023   10:34 AM 06/04/2023   10:38 AM 05/22/2023   10:13 AM  CBC  WBC 4.0 - 10.5 K/uL 5.9  6.0  7.2   Hemoglobin 13.0 - 17.0 g/dL 08.6  57.8  46.9   Hematocrit 39.0 - 52.0 % 31.6  30.3  32.7   Platelets 150 - 400 K/uL 296  150  281         Latest Ref Rng & Units 06/18/2023   10:34 AM 06/04/2023   12:20 PM 05/22/2023   10:13 AM  CMP  Glucose 70 - 99 mg/dL 629  96  528   BUN 6 -  20 mg/dL 9  8  6    Creatinine 0.61 - 1.24 mg/dL 4.13  2.44  0.10   Sodium 135 - 145 mmol/L 137  138  139   Potassium 3.5 - 5.1 mmol/L 3.0  3.8  3.0   Chloride 98 - 111 mmol/L 105  106  105   CO2 22 - 32 mmol/L 26  24  27    Calcium 8.9 - 10.3 mg/dL 8.8  8.9  8.6   Total Protein 6.5 -  8.1 g/dL 6.1  6.2  5.9   Total Bilirubin 0.3 - 1.2 mg/dL 0.5  0.6  0.5   Alkaline Phos 38 - 126 U/L 101  72  71   AST 15 - 41 U/L 21  18  16    ALT 0 - 44 U/L 34  21  17       RADIOGRAPHIC STUDIES: I have personally reviewed the radiological images as listed and agreed with the findings in the report. No results found.    Orders Placed This Encounter  Procedures   CBC with Differential (Cancer Center Only)    Standing Status:   Future    Standing Expiration Date:   07/01/2024   CMP (Cancer Center only)    Standing Status:   Future    Standing Expiration Date:   07/01/2024   CBC with Differential (Cancer Center Only)    Standing Status:   Future    Standing Expiration Date:   07/15/2024   CMP (Cancer Center only)    Standing Status:   Future    Standing Expiration Date:   07/15/2024   CBC with Differential (Cancer Center Only)    Standing Status:   Future    Standing Expiration Date:   07/29/2024   CMP (Cancer Center only)    Standing Status:   Future    Standing Expiration Date:   07/29/2024   CBC with Differential (Cancer Center Only)    Standing Status:   Future    Standing Expiration Date:   08/19/2024   CMP (Cancer Center only)    Standing Status:   Future    Standing Expiration Date:   08/19/2024   All questions were answered. The patient knows to call the clinic with any problems, questions or concerns. No barriers to learning was detected. The total time spent in the appointment was 30 minutes.     [name], MD 06/18/2023

## 2023-06-18 NOTE — Patient Instructions (Addendum)
[facility]  Discharge Instructions: Thank you for choosing [facility] to provide your oncology and hematology care.   If you have a lab appointment with the Cancer Center, please go directly to the Cancer Center and check in at the registration area.   Wear comfortable clothing and clothing appropriate for easy access to any Portacath or PICC line.   We strive to give you quality time with your provider. You may need to reschedule your appointment if you arrive late (15 or more minutes).  Arriving late affects you and other patients whose appointments are after yours.  Also, if you miss three or more appointments without notifying the office, you may be dismissed from the clinic at the provider's discretion.      For prescription refill requests, have your pharmacy contact our office and allow 72 hours for refills to be completed.    Today you received the following chemotherapy and/or immunotherapy agents: Irinotecan liposomal, Leucovorin, 5FU      To help prevent nausea and vomiting after your treatment, we encourage you to take your nausea medication as directed.  BELOW ARE SYMPTOMS THAT SHOULD BE REPORTED IMMEDIATELY: *FEVER GREATER THAN 100.4 F (38 C) OR HIGHER *CHILLS OR SWEATING *NAUSEA AND VOMITING THAT IS NOT CONTROLLED WITH YOUR NAUSEA MEDICATION *UNUSUAL SHORTNESS OF BREATH *UNUSUAL BRUISING OR BLEEDING *URINARY PROBLEMS (pain or burning when urinating, or frequent urination) *BOWEL PROBLEMS (unusual diarrhea, constipation, pain near the anus) TENDERNESS IN MOUTH AND THROAT WITH OR WITHOUT PRESENCE OF ULCERS (sore throat, sores in mouth, or a toothache) UNUSUAL RASH, SWELLING OR PAIN  UNUSUAL VAGINAL DISCHARGE OR ITCHING   Items with * indicate a potential emergency and should be followed up as soon as possible or go to the Emergency Department if any problems should occur.  Please show the CHEMOTHERAPY ALERT CARD or  IMMUNOTHERAPY ALERT CARD at check-in to the Emergency Department and triage nurse.  Should you have questions after your visit or need to cancel or reschedule your appointment, please contact [facility]  Dept: [phone]  and follow the prompts.  Office hours are 8:00 a.m. to 4:30 p.m. Monday - Friday. Please note that voicemails left after 4:00 p.m. may not be returned until the following business day.  We are closed weekends and major holidays. You have access to a nurse at all times for urgent questions. Please call the main number to the clinic Dept: [phone] and follow the prompts.   For any non-urgent questions, you may also contact your provider using MyChart. We now offer e-Visits for anyone 62 and older to request care online for non-urgent symptoms. For details visit [email or web address].   Also download the MyChart app! Go to the app store, search "MyChart", open the app, select [place], and log in with your MyChart username and password.

## 2023-06-19 LAB — CANCER ANTIGEN 19-9: CA 19-9: 80 U/mL — ABNORMAL HIGH (ref 0–35)

## 2023-06-20 ENCOUNTER — Inpatient Hospital Stay: Payer: BC Managed Care – PPO

## 2023-06-20 VITALS — BP 117/72 | HR 69 | Temp 98.7°F | Resp 18

## 2023-06-20 DIAGNOSIS — Z5111 Encounter for antineoplastic chemotherapy: Secondary | ICD-10-CM | POA: Diagnosis not present

## 2023-06-20 DIAGNOSIS — K649 Unspecified hemorrhoids: Secondary | ICD-10-CM | POA: Diagnosis not present

## 2023-06-20 DIAGNOSIS — Z95828 Presence of other vascular implants and grafts: Secondary | ICD-10-CM

## 2023-06-20 DIAGNOSIS — K5900 Constipation, unspecified: Secondary | ICD-10-CM | POA: Diagnosis not present

## 2023-06-20 DIAGNOSIS — C259 Malignant neoplasm of pancreas, unspecified: Secondary | ICD-10-CM

## 2023-06-20 DIAGNOSIS — G620 Drug-induced polyneuropathy: Secondary | ICD-10-CM | POA: Diagnosis not present

## 2023-06-20 DIAGNOSIS — C250 Malignant neoplasm of head of pancreas: Secondary | ICD-10-CM | POA: Diagnosis not present

## 2023-06-20 DIAGNOSIS — E876 Hypokalemia: Secondary | ICD-10-CM | POA: Diagnosis not present

## 2023-06-20 DIAGNOSIS — D649 Anemia, unspecified: Secondary | ICD-10-CM | POA: Diagnosis not present

## 2023-06-20 DIAGNOSIS — K521 Toxic gastroenteritis and colitis: Secondary | ICD-10-CM | POA: Diagnosis not present

## 2023-06-20 DIAGNOSIS — C787 Secondary malignant neoplasm of liver and intrahepatic bile duct: Secondary | ICD-10-CM | POA: Diagnosis not present

## 2023-06-20 DIAGNOSIS — Z452 Encounter for adjustment and management of vascular access device: Secondary | ICD-10-CM | POA: Diagnosis not present

## 2023-06-20 MED ORDER — HEPARIN SOD (PORK) LOCK FLUSH 100 UNIT/ML IV SOLN
500.0000 [IU] | Freq: Once | INTRAVENOUS | Status: AC
  Administered 2023-06-20: 500 [IU]

## 2023-06-20 MED ORDER — SODIUM CHLORIDE 0.9% FLUSH
10.0000 mL | Freq: Once | INTRAVENOUS | Status: AC
  Administered 2023-06-20: 10 mL

## 2023-06-21 ENCOUNTER — Other Ambulatory Visit: Payer: Self-pay

## 2023-07-01 ENCOUNTER — Other Ambulatory Visit: Payer: Self-pay

## 2023-07-01 NOTE — Assessment & Plan Note (Signed)
metastatic to liver, stage IV, KRAS G12R (+), MMR normal  -incidental finding on lung cancer screening chest CT on 01/19/22. 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  -He began first-line palliative FOLFIRINOX on 03/13/22, but unfortunately did not respond well. CA 19-9 also rose on treatment. -he switched to second line gemcitabine/abraxane on 06/05/22. He tolerates moderate well and has continued to work full time. CA 19-9 has dropped on treatment. -restaging CT from 08/24/22 showed partial response to treatment, improved primary tumor, and liver/node mets, I discussed with him  -Pt was referred to see Dr. [name] on 10/10/2022 , surgery was discussed but not felt to be a surgical candidate due to his multiple liver metastasis (5) -Restaging CT scan on November 24, 2022 showed moderate disease progression in liver and the pancreas, his tumor marker also increased.  I have changed his chemotherapy to 5-FU and liposomal irinotecan, started on 12/04/22, he is tolerating well  -Restaging scan from March 06, 2023 showed slightly decreased pancreatic mass, but he has slight progression in liver metastasis.  Has been trending up lately, also indicating a mild progression. -We discussed there is very limited treatment option at this point, I would recommend him to try docetaxel, gemcitabine and capecitabine as next line therapy. -pt is clinically doing well, given the limited treatment options, we decided to continue current therapy until he has further disease progression. -I reviewed his Guardant360 results, which is negative for targetable mutations  -restaging CT on 05/30/2023 showed overall stable disease, but probably a tiny new liver metastatic lesion.  His tumor marker has slightly increased lately.  Given the limited treatment options, I will continue his current therapy now and repeat CT in 2 months

## 2023-07-02 ENCOUNTER — Inpatient Hospital Stay: Payer: BC Managed Care – PPO | Admitting: Hematology

## 2023-07-02 ENCOUNTER — Inpatient Hospital Stay: Payer: BC Managed Care – PPO | Attending: Hematology

## 2023-07-02 ENCOUNTER — Inpatient Hospital Stay: Payer: BC Managed Care – PPO

## 2023-07-02 VITALS — BP 104/61 | HR 65 | Temp 97.6°F | Resp 18 | Wt 238.3 lb

## 2023-07-02 DIAGNOSIS — Z5111 Encounter for antineoplastic chemotherapy: Secondary | ICD-10-CM | POA: Diagnosis not present

## 2023-07-02 DIAGNOSIS — C259 Malignant neoplasm of pancreas, unspecified: Secondary | ICD-10-CM

## 2023-07-02 DIAGNOSIS — Z452 Encounter for adjustment and management of vascular access device: Secondary | ICD-10-CM | POA: Diagnosis not present

## 2023-07-02 DIAGNOSIS — K219 Gastro-esophageal reflux disease without esophagitis: Secondary | ICD-10-CM | POA: Diagnosis not present

## 2023-07-02 DIAGNOSIS — C250 Malignant neoplasm of head of pancreas: Secondary | ICD-10-CM | POA: Diagnosis not present

## 2023-07-02 DIAGNOSIS — G620 Drug-induced polyneuropathy: Secondary | ICD-10-CM | POA: Diagnosis not present

## 2023-07-02 DIAGNOSIS — K521 Toxic gastroenteritis and colitis: Secondary | ICD-10-CM | POA: Insufficient documentation

## 2023-07-02 DIAGNOSIS — E876 Hypokalemia: Secondary | ICD-10-CM | POA: Diagnosis not present

## 2023-07-02 DIAGNOSIS — K1230 Oral mucositis (ulcerative), unspecified: Secondary | ICD-10-CM | POA: Diagnosis not present

## 2023-07-02 DIAGNOSIS — R5383 Other fatigue: Secondary | ICD-10-CM | POA: Diagnosis not present

## 2023-07-02 DIAGNOSIS — C787 Secondary malignant neoplasm of liver and intrahepatic bile duct: Secondary | ICD-10-CM

## 2023-07-02 DIAGNOSIS — Z95828 Presence of other vascular implants and grafts: Secondary | ICD-10-CM

## 2023-07-02 LAB — CBC WITH DIFFERENTIAL (CANCER CENTER ONLY)
Abs Immature Granulocytes: 0.02 10*3/uL (ref 0.00–0.07)
Basophils Absolute: 0.1 10*3/uL (ref 0.0–0.1)
Basophils Relative: 1 %
Eosinophils Absolute: 0.5 10*3/uL (ref 0.0–0.5)
Eosinophils Relative: 9 %
HCT: 30 % — ABNORMAL LOW (ref 39.0–52.0)
Hemoglobin: 10.2 g/dL — ABNORMAL LOW (ref 13.0–17.0)
Immature Granulocytes: 0 %
Lymphocytes Relative: 32 %
Lymphs Abs: 2 10*3/uL (ref 0.7–4.0)
MCH: 30.9 pg (ref 26.0–34.0)
MCHC: 34 g/dL (ref 30.0–36.0)
MCV: 90.9 fL (ref 80.0–100.0)
Monocytes Absolute: 0.6 10*3/uL (ref 0.1–1.0)
Monocytes Relative: 9 %
Neutro Abs: 3.1 10*3/uL (ref 1.7–7.7)
Neutrophils Relative %: 49 %
Platelet Count: 312 10*3/uL (ref 150–400)
RBC: 3.3 MIL/uL — ABNORMAL LOW (ref 4.22–5.81)
RDW: 15.9 % — ABNORMAL HIGH (ref 11.5–15.5)
WBC Count: 6.4 10*3/uL (ref 4.0–10.5)
nRBC: 0 % (ref 0.0–0.2)

## 2023-07-02 LAB — CMP (CANCER CENTER ONLY)
ALT: 17 U/L (ref 0–44)
AST: 17 U/L (ref 15–41)
Albumin: 3.4 g/dL — ABNORMAL LOW (ref 3.5–5.0)
Alkaline Phosphatase: 80 U/L (ref 38–126)
Anion gap: 6 (ref 5–15)
BUN: 8 mg/dL (ref 6–20)
CO2: 26 mmol/L (ref 22–32)
Calcium: 8.4 mg/dL — ABNORMAL LOW (ref 8.9–10.3)
Chloride: 107 mmol/L (ref 98–111)
Creatinine: 0.84 mg/dL (ref 0.61–1.24)
GFR, Estimated: >60 mL/min (ref >60)
Glucose, Bld: 110 mg/dL — ABNORMAL HIGH (ref 70–99)
Potassium: 3.2 mmol/L — ABNORMAL LOW (ref 3.5–5.1)
Sodium: 139 mmol/L (ref 135–145)
Total Bilirubin: 0.4 mg/dL (ref <1.2)
Total Protein: 5.7 g/dL — ABNORMAL LOW (ref 6.5–8.1)

## 2023-07-02 MED ORDER — HEPARIN SOD (PORK) LOCK FLUSH 100 UNIT/ML IV SOLN
500.0000 [IU] | Freq: Once | INTRAVENOUS | Status: DC | PRN
Start: 2023-07-02 — End: 2023-07-02

## 2023-07-02 MED ORDER — FLUOROURACIL CHEMO INJECTION 5 GM/100ML
2400.0000 mg/m2 | INTRAVENOUS | Status: DC
  Administered 2023-07-02: 5900 mg via INTRAVENOUS
  Filled 2023-07-02: qty 118

## 2023-07-02 MED ORDER — IRINOTECAN HCL LIPOSOME CHEMO INJECTION 43 MG/10ML
60.0000 mg/m2 | INJECTION | Freq: Once | INTRAVENOUS | Status: AC
  Administered 2023-07-02: 146.2 mg via INTRAVENOUS
  Filled 2023-07-02: qty 34

## 2023-07-02 MED ORDER — SODIUM CHLORIDE 0.9 % IV SOLN
400.0000 mg/m2 | Freq: Once | INTRAVENOUS | Status: AC
  Administered 2023-07-02: 980 mg via INTRAVENOUS
  Filled 2023-07-02: qty 49

## 2023-07-02 MED ORDER — DEXAMETHASONE SODIUM PHOSPHATE 10 MG/ML IJ SOLN
10.0000 mg | Freq: Once | INTRAMUSCULAR | Status: AC
  Administered 2023-07-02: 10 mg via INTRAVENOUS
  Filled 2023-07-02: qty 1

## 2023-07-02 MED ORDER — SODIUM CHLORIDE 0.9% FLUSH
10.0000 mL | INTRAVENOUS | Status: DC | PRN
Start: 2023-07-02 — End: 2023-07-02

## 2023-07-02 MED ORDER — PALONOSETRON HCL INJECTION 0.25 MG/5ML
0.2500 mg | Freq: Once | INTRAVENOUS | Status: AC
  Administered 2023-07-02: 0.25 mg via INTRAVENOUS
  Filled 2023-07-02: qty 5

## 2023-07-02 MED ORDER — SODIUM CHLORIDE 0.9% FLUSH
10.0000 mL | Freq: Once | INTRAVENOUS | Status: AC
Start: 2023-07-02 — End: 2023-07-02
  Administered 2023-07-02: 10 mL

## 2023-07-02 MED ORDER — SODIUM CHLORIDE 0.9 % IV SOLN: Freq: Once | INTRAVENOUS | Status: AC

## 2023-07-02 MED ORDER — ATROPINE SULFATE 1 MG/ML IV SOLN
0.5000 mg | Freq: Once | INTRAVENOUS | Status: AC | PRN
  Administered 2023-07-02: 0.5 mg via INTRAVENOUS
  Filled 2023-07-02: qty 1

## 2023-07-02 NOTE — Patient Instructions (Signed)
[facility] - A DEPT OF [facility]  Discharge Instructions: Thank you for choosing [facility] to provide your oncology and hematology care.   If you have a lab appointment with the Cancer Center, please go directly to the Cancer Center and check in at the registration area.   Wear comfortable clothing and clothing appropriate for easy access to any Portacath or PICC line.   We strive to give you quality time with your provider. You may need to reschedule your appointment if you arrive late (15 or more minutes).  Arriving late affects you and other patients whose appointments are after yours.  Also, if you miss three or more appointments without notifying the office, you may be dismissed from the clinic at the provider's discretion.      For prescription refill requests, have your pharmacy contact our office and allow 72 hours for refills to be completed.    Today you received the following chemotherapy and/or immunotherapy agents: Irinotecan, Leucovorin, and Fluorouracil.      To help prevent nausea and vomiting after your treatment, we encourage you to take your nausea medication as directed.  BELOW ARE SYMPTOMS THAT SHOULD BE REPORTED IMMEDIATELY: *FEVER GREATER THAN 100.4 F (38 C) OR HIGHER *CHILLS OR SWEATING *NAUSEA AND VOMITING THAT IS NOT CONTROLLED WITH YOUR NAUSEA MEDICATION *UNUSUAL SHORTNESS OF BREATH *UNUSUAL BRUISING OR BLEEDING *URINARY PROBLEMS (pain or burning when urinating, or frequent urination) *BOWEL PROBLEMS (unusual diarrhea, constipation, pain near the anus) TENDERNESS IN MOUTH AND THROAT WITH OR WITHOUT PRESENCE OF ULCERS (sore throat, sores in mouth, or a toothache) UNUSUAL RASH, SWELLING OR PAIN  UNUSUAL VAGINAL DISCHARGE OR ITCHING   Items with * indicate a potential emergency and should be followed up as soon as possible or go to the Emergency Department if any problems should occur.  Please show the  CHEMOTHERAPY ALERT CARD or IMMUNOTHERAPY ALERT CARD at check-in to the Emergency Department and triage nurse.  Should you have questions after your visit or need to cancel or reschedule your appointment, please contact [facility] - A DEPT OF [facility]  Dept: [phone]  and follow the prompts.  Office hours are 8:00 a.m. to 4:30 p.m. Monday - Friday. Please note that voicemails left after 4:00 p.m. may not be returned until the following business day.  We are closed weekends and major holidays. You have access to a nurse at all times for urgent questions. Please call the main number to the clinic Dept: [phone] and follow the prompts.   For any non-urgent questions, you may also contact your provider using MyChart. We now offer e-Visits for anyone 5 and older to request care online for non-urgent symptoms. For details visit [email or web address].   Also download the MyChart app! Go to the app store, search "MyChart", open the app, select [facility], and log in with your MyChart username and password.  The chemotherapy medication bag should finish at 46 hours, 96 hours, or 7 days. For example, if your pump is scheduled for 46 hours and it was put on at 4:00 p.m., it should finish at 2:00 p.m. the day it is scheduled to come off regardless of your appointment time.     Estimated time to finish at:   If the display on your pump reads "Low Volume" and it is beeping, take the batteries out of the pump and come to the cancer center for it to be taken  off.   If the pump alarms go off prior to the pump reading "Low Volume" then call [phone] and someone can assist you.  If the plunger comes out and the chemotherapy medication is leaking out, please use your home chemo spill kit to clean up the spill. Do NOT use paper towels or other household products.  If you have problems or questions regarding your pump, please call either [phone] (24 hours a  day) or the cancer center Monday-Friday 8:00 a.m.- 4:30 p.m. at the clinic number and we will assist you. If you are unable to get assistance, then go to the nearest Emergency Department and ask the staff to contact the IV team for assistance.

## 2023-07-02 NOTE — Progress Notes (Signed)
[facility]   [phone] [phone]   Clinic Follow up Note   Patient Care Team: [name], MD as PCP - General (Family Medicine) [name], MD as Consulting Physician (Oncology) [name], Revonda Standard., MD as Consulting Physician (General Surgery) [facility] as Radiologist (Radiology)  Date of Service:  07/02/2023  CHIEF COMPLAINT: f/u of pancreatic cancer  CURRENT THERAPY:  Third line chemotherapy FOLFIRI with liposomal irinotecan  Oncology History   Pancreatic cancer metastasized to liver [facility]) metastatic to liver, stage IV, KRAS G12R (+), MMR normal  -incidental finding on lung cancer screening chest CT on 01/19/22. 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  -He began first-line palliative FOLFIRINOX on 03/13/22, but unfortunately did not respond well. CA 19-9 also rose on treatment. -he switched to second line gemcitabine/abraxane on 06/05/22. He tolerates moderate well and has continued to work full time. CA 19-9 has dropped on treatment. -restaging CT from 08/24/22 showed partial response to treatment, improved primary tumor, and liver/node mets, I discussed with him  -Pt was referred to see Dr. [name] on 10/10/2022 , surgery was discussed but not felt to be a surgical candidate due to his multiple liver metastasis (5) -Restaging CT scan on November 24, 2022 showed moderate disease progression in liver and the pancreas, his tumor marker also increased.  I have changed his chemotherapy to 5-FU and liposomal irinotecan, started on 12/04/22, he is tolerating well  -Restaging scan from March 06, 2023 showed slightly decreased pancreatic mass, but he has slight progression in liver metastasis.  Has been trending up lately, also indicating a mild progression. -We discussed there is very limited treatment option at this point,  I would recommend him to try docetaxel, gemcitabine and capecitabine as next line therapy. -pt is clinically doing well, given the limited treatment options, we decided to continue current therapy until he has further disease progression. -I reviewed his Guardant360 results, which is negative for targetable mutations  -restaging CT on 05/30/2023 showed overall stable disease, but probably a tiny new liver metastatic lesion.  His tumor marker has slightly increased lately.  Given the limited treatment options, I will continue his current therapy now and repeat CT in 2 months     Assessment and Plan    Pancreatic Cancer Follow-up for pancreatic cancer. Reports intermittent fatigue and diarrhea, managed with Imodium and Lomotil. Weight fluctuates with chemotherapy cycles. Tumor markers stable. Discussed post-chemotherapy bodily fluid toxicity, advising caution for the first 3-5 days and the need to avoid oral sex and use condoms during intercourse for the first week post-chemotherapy. - Continue current chemotherapy regimen - Schedule next chemotherapy session on July 16, 2023 - Advise to flush toilet and wash clothes separately for the first 3-5 days post-chemotherapy - Use condoms during intercourse for the first week post-chemotherapy - Avoid oral sex due to potential toxicity of saliva - Order follow-up scan in January 2025  PE -diagnosed in 05/2023 On Eliquis with improved shortness of breath. No new symptoms of swelling or dyspnea. Emphasized the importance of continuing Eliquis and timely prescription refills. - Continue Eliquis as prescribed - Ensure timely prescription refills  Gastroesophageal Reflux Disease (GERD) Occasional acid reflux managed with Prilosec. No current symptoms requiring Protonix. Discussed that Protonix can be used if symptoms worsen. - Continue Prilosec as needed - No need to start Protonix at this time  Plan -  Lab reviewed, adequate for treatment, will  proceed chemo today and continue every 2 weeks - Follow-up appointment in 2 weeks         SUMMARY OF ONCOLOGIC HISTORY: Oncology History Overview Note   Cancer Staging  Pancreatic cancer metastasized to liver [facility]) Staging form: Exocrine Pancreas, AJCC 8th Edition - Clinical stage from 02/28/2022: Stage IV (cT2, cN1, pM1) - Signed by [name], MD on 03/02/2022 Stage prefix: Initial diagnosis     Pancreatic cancer metastasized to liver (HCC)  02/28/2022 Cancer Staging   Staging form: Exocrine Pancreas, AJCC 8th Edition - Clinical stage from 02/28/2022: Stage IV (cT2, cN1, pM1) - Signed by [name], MD on 03/02/2022 Stage prefix: Initial diagnosis   03/02/2022 Initial Diagnosis   Pancreatic cancer metastasized to liver (HCC)   03/13/2022 - 04/13/2022 Chemotherapy   Patient is on Treatment Plan : PANCREAS Modified FOLFIRINOX q14d x 4 cycles     03/13/2022 - 05/17/2022 Chemotherapy   Patient is on Treatment Plan : PANCREAS Modified FOLFIRINOX q14d x 4 cycles     03/18/2022 Genetic Testing   Negative genetic testing on the CancerNext-Expanded+RNAinsight panel.  APC c.[id number] VUs identified.  The report date is March 18, 2022.  The CancerNext-Expanded gene panel offered by [facility] and includes sequencing and rearrangement analysis for the following 77 genes: AIP, ALK, APC*, ATM*, AXIN2, BAP1, BARD1, BLM, BMPR1A, BRCA1*, BRCA2*, BRIP1*, CDC73, CDH1*, CDK4, CDKN1B, CDKN2A, CHEK2*, CTNNA1, DICER1, FANCC, FH, FLCN, GALNT12, KIF1B, LZTR1, MAX, MEN1, MET, MLH1*, MSH2*, MSH3, MSH6*, MUTYH*, NBN, NF1*, NF2, NTHL1, PALB2*, PHOX2B, PMS2*, POT1, PRKAR1A, PTCH1, PTEN*, RAD51C*, RAD51D*, RB1, RECQL, RET, SDHA, SDHAF2, SDHB, SDHC, SDHD, SMAD4, SMARCA4, SMARCB1, SMARCE1, STK11, SUFU, TMEM127, TP53*, TSC1, TSC2, VHL and XRCC2 (sequencing and deletion/duplication); EGFR, EGLN1, HOXB13, KIT, MITF, PDGFRA, POLD1, and POLE (sequencing only); EPCAM and GREM1 (deletion/duplication only). DNA and RNA analyses  performed for * genes.    05/25/2022 Progression   Rising CA 19-9, CT CAP IMPRESSION: 1. Multiple new and enlarged hypodense, rim enhancing liver metastases. 2. Unchanged, ill-defined hypoenhancing mass of the pancreatic uncinate, consistent with pancreatic adenocarcinoma 3. Unchanged small prominent portacaval and gastrohepatic ligament nodes, modestly suspicious for nodal metastases. No overtly enlarged lymph nodes in the abdomen or pelvis. 4. Prostatomegaly.    06/05/2022 - 11/13/2022 Chemotherapy   Patient is on Treatment Plan : PANCREATIC Abraxane D1,8,15 + Gemcitabine D1,8,15 q28d     11/24/2022 Imaging   IMPRESSION: 1. Today's study demonstrates mild progression of disease as evidenced by slight enlargement of the mass in the uncinate process of the pancreas which demonstrates potential direct invasion into the superior wall of the third portion of the duodenum, but currently demonstrates no definite vascular involvement. Multiple hepatic lesions are generally stable to the prior study, with exception of enlargement of a lesion in segment 8, as detailed above. 2. No new sites of metastatic disease are noted elsewhere in the chest or pelvis. 3. Aortic atherosclerosis.  Rising CA 19-9    12/04/2022 -  Chemotherapy   Patient is on Treatment Plan : PANCREAS Liposomal Irinotecan + Leucovorin + 5-FU IVCI q14d     03/06/2023 Imaging    IMPRESSION: 1. Mass in the pancreatic head demonstrates decreased size since prior study. 2. Multiple liver metastasis are mildly increased in size. No new lesions. 3. 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 Metastatic disease would be less likely. 4. Mild aortic  atherosclerosis. 5. Nonspecific sclerotic foci in the pelvis, unchanged, likely benign bone islands.        Discussed the use of AI scribe software  for clinical note transcription with the patient, who gave verbal consent to proceed.  History of Present Illness   The patient, a 57 year old gentleman with pancreatic cancer, presents for a routine follow-up. He reports intermittent episodes of fatigue and diarrhea lasting about three days, which he manages with Imodium and Lomotil. During these episodes, he usually works half-days or takes a day off. He has noticed a pattern of weight fluctuation around his chemotherapy sessions, gaining about five pounds from fluid retention immediately after treatment, then losing about eight pounds during his "bad days." He works hard to regain the weight in the days leading up to his next chemotherapy session. He denies any leg swelling.  The patient also reports an improvement in his breathing since starting blood thinners for previously diagnosed blood clots. He no longer feels short of breath and is able to walk around more than before. He has noticed that he sometimes takes deeper breaths, which he attributes to being less active.  He has not been taking Protonix for acid reflux, as he doesn't experience it often. He manages occasional episodes with Prilosec. He has a prescription for Eliquis for blood clots, which he has been taking as prescribed.         All other systems were reviewed with the patient and are negative.  MEDICAL HISTORY:  Past Medical History:  Diagnosis Date   Family history of adverse reaction to anesthesia    mother allergic to ether    Family history of breast cancer    Family history of colon cancer    Pneumonia    hx of 2014     SURGICAL HISTORY: Past Surgical History:  Procedure Laterality Date   APPENDECTOMY     CHOLECYSTECTOMY N/A 07/12/2018   Procedure: LAPAROSCOPIC CHOLECYSTECTOMY WITH INTRAOPERATIVE CHOLANGIOGRAM;  Surgeon: Luretha Murphy, MD;  Location: [facility] ORS;  Service: General;  Laterality: N/A;   left wrist surgery      has plate in arm    NASAL POLYP  SURGERY  2017   PORTACATH PLACEMENT N/A 03/10/2022   Procedure: INSERTION PORT-A-CATH;  Surgeon: Fritzi Mandes, MD;  Location: [facility];  Service: General;  Laterality: N/A;   VENTRAL HERNIA REPAIR N/A 07/15/2014   Procedure: LAPAROSCOPIC REPAIR INCARCARTED UMBILICAL  VENTRAL HERNIA, ;  Surgeon: Claud Kelp, MD;  Location: [facility] ORS;  Service: General;  Laterality: N/A;  With MESH    I have reviewed the social history and family history with the patient and they are unchanged from previous note.  ALLERGIES:  has No Known Allergies.  MEDICATIONS:  Current Outpatient Medications  Medication Sig Dispense Refill   apixaban (ELIQUIS) 5 MG TABS tablet Take 1 tablet (5 mg total) by mouth 2 (two) times daily. 60 tablet 3   lidocaine-prilocaine (EMLA) cream Apply to affected area once 30 g 3   lipase/protease/amylase (CREON) 36000 UNITS CPEP capsule Take 2 capsules (72,000 Units total) by mouth 3 (three) times daily with meals. May also take 1 capsule (36,000 Units total) as needed (with snacks). 240 capsule 3   loperamide (IMODIUM A-D) 2 MG tablet Take 2 mg by mouth 4 (four) times daily as needed for diarrhea or loose stools.     ondansetron (ZOFRAN) 8 MG tablet Take 1 tablet (8 mg total) by mouth every 8 (eight) hours as needed for nausea or  vomiting. Start on the third day after irinotecan (Patient not taking: Reported on 04/10/2023) 30 tablet 1   pantoprazole (PROTONIX) 40 MG tablet Take 1 tablet (40 mg total) by mouth daily. 30 tablet 1   potassium chloride SA (KLOR-CON M20) 20 MEQ tablet Take 1 tablet (20 mEq total) by mouth 2 (two) times daily AND 1 tablet (20 mEq total) daily. Take 1 tab (20 meq) 2x daily for 7 days and then take 1 tab (20 meq) daily for remainder of the month. 37 tablet 0   prochlorperazine (COMPAZINE) 10 MG tablet Take 1 tablet (10 mg total) by mouth every 6 (six) hours as needed for nausea or vomiting. (Patient not taking: Reported on 04/10/2023) 30 tablet 1   valACYclovir  (VALTREX) 500 MG tablet Take 1 tablet (500 mg total) by mouth 2 (two) times daily. 60 tablet 3   No current facility-administered medications for this visit.   Facility-Administered Medications Ordered in Other Visits  Medication Dose Route Frequency Provider Last Rate Last Admin   fluorouracil (ADRUCIL) 5,900 mg in sodium chloride 0.9 % 132 mL chemo infusion  2,400 mg/m2 (Treatment Plan Recorded) Intravenous 1 day or 1 dose [name], MD       heparin lock flush 100 unit/mL  500 Units Intracatheter Once PRN [name], MD       sodium chloride flush (NS) 0.9 % injection 10 mL  10 mL Intracatheter PRN [name], MD        PHYSICAL EXAMINATION: ECOG PERFORMANCE STATUS: 1 - Symptomatic but completely ambulatory  Vitals:   07/02/23 1222  BP: 104/61  Pulse: 65  Resp: 18  Temp: 97.6 F (36.4 C)  SpO2: 96%   Wt Readings from Last 3 Encounters:  07/02/23 238 lb 4.8 oz (108.1 kg)  06/18/23 235 lb 11.2 oz (106.9 kg)  06/04/23 237 lb 9.6 oz (107.8 kg)     GENERAL:alert, no distress and comfortable SKIN: skin color, texture, turgor are normal, no rashes or significant lesions EYES: normal, Conjunctiva are pink and non-injected, sclera clear NECK: supple, thyroid normal size, non-tender, without nodularity LYMPH:  no palpable lymphadenopathy in the cervical, axillary  LUNGS: clear to auscultation and percussion with normal breathing effort HEART: regular rate & rhythm and no murmurs and no lower extremity edema ABDOMEN:abdomen soft, non-tender and normal bowel sounds Musculoskeletal:no cyanosis of digits and no clubbing  NEURO: alert & oriented x 3 with fluent speech, no focal motor/sensory deficits    LABORATORY DATA:  I have reviewed the data as listed    Latest Ref Rng & Units 07/02/2023   12:02 PM 06/18/2023   10:34 AM 06/04/2023   10:38 AM  CBC  WBC 4.0 - 10.5 K/uL 6.4  5.9  6.0   Hemoglobin 13.0 - 17.0 g/dL 08.6  57.8  46.9   Hematocrit 39.0 - 52.0 % 30.0  31.6  30.3    Platelets 150 - 400 K/uL 312  296  150         Latest Ref Rng & Units 07/02/2023   12:02 PM 06/18/2023   10:34 AM 06/04/2023   12:20 PM  CMP  Glucose 70 - 99 mg/dL 629  528  96   BUN 6 - 20 mg/dL 8  9  8    Creatinine 0.61 - 1.24 mg/dL 4.13  2.44  0.10   Sodium 135 - 145 mmol/L 139  137  138   Potassium 3.5 - 5.1 mmol/L 3.2  3.0  3.8   Chloride 98 -  111 mmol/L 107  105  106   CO2 22 - 32 mmol/L 26  26  24    Calcium 8.9 - 10.3 mg/dL 8.4  8.8  8.9   Total Protein 6.5 - 8.1 g/dL 5.7  6.1  6.2   Total Bilirubin <1.2 mg/dL 0.4  0.5  0.6   Alkaline Phos 38 - 126 U/L 80  101  72   AST 15 - 41 U/L 17  21  18    ALT 0 - 44 U/L 17  34  21       RADIOGRAPHIC STUDIES: I have personally reviewed the radiological images as listed and agreed with the findings in the report. No results found.    No orders of the defined types were placed in this encounter.  All questions were answered. The patient knows to call the clinic with any problems, questions or concerns. No barriers to learning was detected. The total time spent in the appointment was 25 minutes.     [name], MD 07/02/2023

## 2023-07-03 LAB — CANCER ANTIGEN 19-9: CA 19-9: 86 U/mL — ABNORMAL HIGH (ref 0–35)

## 2023-07-04 ENCOUNTER — Inpatient Hospital Stay: Payer: BC Managed Care – PPO

## 2023-07-04 VITALS — BP 117/61 | HR 56 | Temp 98.8°F | Resp 16

## 2023-07-04 DIAGNOSIS — K219 Gastro-esophageal reflux disease without esophagitis: Secondary | ICD-10-CM | POA: Diagnosis not present

## 2023-07-04 DIAGNOSIS — C259 Malignant neoplasm of pancreas, unspecified: Secondary | ICD-10-CM

## 2023-07-04 DIAGNOSIS — K521 Toxic gastroenteritis and colitis: Secondary | ICD-10-CM | POA: Diagnosis not present

## 2023-07-04 DIAGNOSIS — C250 Malignant neoplasm of head of pancreas: Secondary | ICD-10-CM | POA: Diagnosis not present

## 2023-07-04 DIAGNOSIS — G620 Drug-induced polyneuropathy: Secondary | ICD-10-CM | POA: Diagnosis not present

## 2023-07-04 DIAGNOSIS — Z452 Encounter for adjustment and management of vascular access device: Secondary | ICD-10-CM | POA: Diagnosis not present

## 2023-07-04 DIAGNOSIS — R5383 Other fatigue: Secondary | ICD-10-CM | POA: Diagnosis not present

## 2023-07-04 DIAGNOSIS — C787 Secondary malignant neoplasm of liver and intrahepatic bile duct: Secondary | ICD-10-CM | POA: Diagnosis not present

## 2023-07-04 DIAGNOSIS — E876 Hypokalemia: Secondary | ICD-10-CM | POA: Diagnosis not present

## 2023-07-04 DIAGNOSIS — K1230 Oral mucositis (ulcerative), unspecified: Secondary | ICD-10-CM | POA: Diagnosis not present

## 2023-07-04 DIAGNOSIS — Z5111 Encounter for antineoplastic chemotherapy: Secondary | ICD-10-CM | POA: Diagnosis not present

## 2023-07-04 MED ORDER — SODIUM CHLORIDE 0.9% FLUSH
10.0000 mL | INTRAVENOUS | Status: DC | PRN
Start: 2023-07-04 — End: 2023-07-04
  Administered 2023-07-04: 10 mL

## 2023-07-04 MED ORDER — HEPARIN SOD (PORK) LOCK FLUSH 100 UNIT/ML IV SOLN
500.0000 [IU] | Freq: Once | INTRAVENOUS | Status: AC | PRN
  Administered 2023-07-04: 500 [IU]

## 2023-07-05 ENCOUNTER — Other Ambulatory Visit: Payer: Self-pay | Admitting: Hematology

## 2023-07-06 DIAGNOSIS — C787 Secondary malignant neoplasm of liver and intrahepatic bile duct: Secondary | ICD-10-CM | POA: Diagnosis not present

## 2023-07-06 DIAGNOSIS — C259 Malignant neoplasm of pancreas, unspecified: Secondary | ICD-10-CM | POA: Diagnosis not present

## 2023-07-15 NOTE — Assessment & Plan Note (Signed)
metastatic to liver, stage IV, KRAS G12R (+), MMR normal  -incidental finding on lung cancer screening chest CT on 01/19/22. 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  -He began first-line palliative FOLFIRINOX on 03/13/22, but unfortunately did not respond well. CA 19-9 also rose on treatment. -he switched to second line gemcitabine/abraxane on 06/05/22. He tolerates moderate well and has continued to work full time. CA 19-9 has dropped on treatment. -restaging CT from 08/24/22 showed partial response to treatment, improved primary tumor, and liver/node mets, I discussed with him  -Pt was referred to see Dr. [name] on 10/10/2022 , surgery was discussed but not felt to be a surgical candidate due to his multiple liver metastasis (5) -Restaging CT scan on November 24, 2022 showed moderate disease progression in liver and the pancreas, his tumor marker also increased.  I have changed his chemotherapy to 5-FU and liposomal irinotecan, started on 12/04/22, he is tolerating well  -Restaging scan from March 06, 2023 showed slightly decreased pancreatic mass, but he has slight progression in liver metastasis.  Has been trending up lately, also indicating a mild progression. -We discussed there is very limited treatment option at this point, I would recommend him to try docetaxel, gemcitabine and capecitabine as next line therapy. -pt is clinically doing well, given the limited treatment options, we decided to continue current therapy until he has further disease progression. -I reviewed his Guardant360 results, which is negative for targetable mutations  -restaging CT on 05/30/2023 showed overall stable disease, but probably a tiny new liver metastatic lesion.  His tumor marker has slightly increased lately.  Given the limited treatment options, I will continue his current therapy now and repeat CT in 2 months

## 2023-07-16 ENCOUNTER — Encounter: Payer: Self-pay | Admitting: Hematology

## 2023-07-16 ENCOUNTER — Ambulatory Visit: Payer: BC Managed Care – PPO

## 2023-07-16 ENCOUNTER — Inpatient Hospital Stay: Payer: BC Managed Care – PPO

## 2023-07-16 ENCOUNTER — Inpatient Hospital Stay: Payer: BC Managed Care – PPO | Admitting: Hematology

## 2023-07-16 VITALS — BP 113/81 | HR 65 | Temp 97.2°F | Resp 18 | Ht 72.0 in | Wt 235.2 lb

## 2023-07-16 DIAGNOSIS — C259 Malignant neoplasm of pancreas, unspecified: Secondary | ICD-10-CM | POA: Diagnosis not present

## 2023-07-16 DIAGNOSIS — K521 Toxic gastroenteritis and colitis: Secondary | ICD-10-CM | POA: Diagnosis not present

## 2023-07-16 DIAGNOSIS — C250 Malignant neoplasm of head of pancreas: Secondary | ICD-10-CM | POA: Diagnosis not present

## 2023-07-16 DIAGNOSIS — Z95828 Presence of other vascular implants and grafts: Secondary | ICD-10-CM

## 2023-07-16 DIAGNOSIS — E876 Hypokalemia: Secondary | ICD-10-CM | POA: Diagnosis not present

## 2023-07-16 DIAGNOSIS — K1230 Oral mucositis (ulcerative), unspecified: Secondary | ICD-10-CM | POA: Diagnosis not present

## 2023-07-16 DIAGNOSIS — Z5111 Encounter for antineoplastic chemotherapy: Secondary | ICD-10-CM | POA: Diagnosis not present

## 2023-07-16 DIAGNOSIS — G620 Drug-induced polyneuropathy: Secondary | ICD-10-CM | POA: Diagnosis not present

## 2023-07-16 DIAGNOSIS — Z452 Encounter for adjustment and management of vascular access device: Secondary | ICD-10-CM | POA: Diagnosis not present

## 2023-07-16 DIAGNOSIS — C787 Secondary malignant neoplasm of liver and intrahepatic bile duct: Secondary | ICD-10-CM | POA: Diagnosis not present

## 2023-07-16 DIAGNOSIS — R5383 Other fatigue: Secondary | ICD-10-CM | POA: Diagnosis not present

## 2023-07-16 DIAGNOSIS — K219 Gastro-esophageal reflux disease without esophagitis: Secondary | ICD-10-CM | POA: Diagnosis not present

## 2023-07-16 LAB — CBC WITH DIFFERENTIAL (CANCER CENTER ONLY)
Abs Immature Granulocytes: 0.01 10*3/uL (ref 0.00–0.07)
Basophils Absolute: 0.1 10*3/uL (ref 0.0–0.1)
Basophils Relative: 1 %
Eosinophils Absolute: 0.7 10*3/uL — ABNORMAL HIGH (ref 0.0–0.5)
Eosinophils Relative: 14 %
HCT: 31 % — ABNORMAL LOW (ref 39.0–52.0)
Hemoglobin: 10.5 g/dL — ABNORMAL LOW (ref 13.0–17.0)
Immature Granulocytes: 0 %
Lymphocytes Relative: 35 %
Lymphs Abs: 1.8 10*3/uL (ref 0.7–4.0)
MCH: 31 pg (ref 26.0–34.0)
MCHC: 33.9 g/dL (ref 30.0–36.0)
MCV: 91.4 fL (ref 80.0–100.0)
Monocytes Absolute: 0.6 10*3/uL (ref 0.1–1.0)
Monocytes Relative: 11 %
Neutro Abs: 2 10*3/uL (ref 1.7–7.7)
Neutrophils Relative %: 39 %
Platelet Count: 267 10*3/uL (ref 150–400)
RBC: 3.39 MIL/uL — ABNORMAL LOW (ref 4.22–5.81)
RDW: 15.7 % — ABNORMAL HIGH (ref 11.5–15.5)
WBC Count: 5.1 10*3/uL (ref 4.0–10.5)
nRBC: 0 % (ref 0.0–0.2)

## 2023-07-16 LAB — CMP (CANCER CENTER ONLY)
ALT: 32 U/L (ref 0–44)
AST: 18 U/L (ref 15–41)
Albumin: 3.4 g/dL — ABNORMAL LOW (ref 3.5–5.0)
Alkaline Phosphatase: 107 U/L (ref 38–126)
Anion gap: 6 (ref 5–15)
BUN: 7 mg/dL (ref 6–20)
CO2: 26 mmol/L (ref 22–32)
Calcium: 8.5 mg/dL — ABNORMAL LOW (ref 8.9–10.3)
Chloride: 106 mmol/L (ref 98–111)
Creatinine: 0.8 mg/dL (ref 0.61–1.24)
GFR, Estimated: >60 mL/min (ref >60)
Glucose, Bld: 122 mg/dL — ABNORMAL HIGH (ref 70–99)
Potassium: 3.1 mmol/L — ABNORMAL LOW (ref 3.5–5.1)
Sodium: 138 mmol/L (ref 135–145)
Total Bilirubin: 0.4 mg/dL (ref <1.2)
Total Protein: 6.1 g/dL — ABNORMAL LOW (ref 6.5–8.1)

## 2023-07-16 MED ORDER — DEXAMETHASONE SODIUM PHOSPHATE 10 MG/ML IJ SOLN
10.0000 mg | Freq: Once | INTRAMUSCULAR | Status: AC
  Administered 2023-07-16: 10 mg via INTRAVENOUS
  Filled 2023-07-16: qty 1

## 2023-07-16 MED ORDER — SODIUM CHLORIDE 0.9 % IV SOLN: Freq: Once | INTRAVENOUS | Status: AC

## 2023-07-16 MED ORDER — SODIUM CHLORIDE 0.9 % IV SOLN
60.0000 mg/m2 | Freq: Once | INTRAVENOUS | Status: AC
  Administered 2023-07-16: 146.2 mg via INTRAVENOUS
  Filled 2023-07-16: qty 34

## 2023-07-16 MED ORDER — SODIUM CHLORIDE 0.9 % IV SOLN
2400.0000 mg/m2 | INTRAVENOUS | Status: DC
  Administered 2023-07-16: 5900 mg via INTRAVENOUS
  Filled 2023-07-16: qty 118

## 2023-07-16 MED ORDER — ATROPINE SULFATE 1 MG/ML IV SOLN
0.5000 mg | Freq: Once | INTRAVENOUS | Status: AC | PRN
  Administered 2023-07-16: 0.5 mg via INTRAVENOUS
  Filled 2023-07-16: qty 1

## 2023-07-16 MED ORDER — SODIUM CHLORIDE 0.9% FLUSH
10.0000 mL | Freq: Once | INTRAVENOUS | Status: AC
  Administered 2023-07-16: 10 mL

## 2023-07-16 MED ORDER — PALONOSETRON HCL INJECTION 0.25 MG/5ML
0.2500 mg | Freq: Once | INTRAVENOUS | Status: AC
  Administered 2023-07-16: 0.25 mg via INTRAVENOUS
  Filled 2023-07-16: qty 5

## 2023-07-16 MED ORDER — SODIUM CHLORIDE 0.9 % IV SOLN
400.0000 mg/m2 | Freq: Once | INTRAVENOUS | Status: AC
  Administered 2023-07-16: 980 mg via INTRAVENOUS
  Filled 2023-07-16: qty 49

## 2023-07-16 NOTE — Progress Notes (Signed)
[facility]   [phone] [phone]   Clinic Follow up Note   Patient Care Team: [name], MD as PCP - General (Family Medicine) [name], MD as Consulting Physician (Oncology) [name], Revonda Standard., MD as Consulting Physician (General Surgery) [facility] as Radiologist (Radiology)  Date of Service:  07/16/2023  CHIEF COMPLAINT: f/u of pancreatic cancer  CURRENT THERAPY:  Liposomal irinotecan and 5-FU  Oncology History   Pancreatic cancer metastasized to liver [facility] metastatic to liver, stage IV, KRAS G12R (+), MMR normal  -incidental finding on lung cancer screening chest CT on 01/19/22. 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  -He began first-line palliative FOLFIRINOX on 03/13/22, but unfortunately did not respond well. CA 19-9 also rose on treatment. -he switched to second line gemcitabine/abraxane on 06/05/22. He tolerates moderate well and has continued to work full time. CA 19-9 has dropped on treatment. -restaging CT from 08/24/22 showed partial response to treatment, improved primary tumor, and liver/node mets, I discussed with him  -Pt was referred to see Dr. [name] on 10/10/2022 , surgery was discussed but not felt to be a surgical candidate due to his multiple liver metastasis (5) -Restaging CT scan on November 24, 2022 showed moderate disease progression in liver and the pancreas, his tumor marker also increased.  I have changed his chemotherapy to 5-FU and liposomal irinotecan, started on 12/04/22, he is tolerating well  -Restaging scan from March 06, 2023 showed slightly decreased pancreatic mass, but he has slight progression in liver metastasis.  Has been trending up lately, also indicating a mild progression. -We discussed there is very limited treatment option at this point, I would recommend him to try  docetaxel, gemcitabine and capecitabine as next line therapy. -pt is clinically doing well, given the limited treatment options, we decided to continue current therapy until he has further disease progression. -I reviewed his Guardant360 results, which is negative for targetable mutations  -restaging CT on 05/30/2023 showed overall stable disease, but probably a tiny new liver metastatic lesion.  His tumor marker has slightly increased lately.  Given the limited treatment options, I will continue his current therapy now and repeat CT in 2 months     Assessment and Plan    Pancreatic Cancer Follow-up for pancreatic cancer. Reports diarrhea lasting seven days with 3-5 bowel movements per day, managed with Imodium. CBC normal, tumor markers CA19.9 stable at 86. Potassium low at 3.2, protein slightly low at 5.7. Neuropathy symptoms well-managed, with cold sensation in feet. No new symptoms or significant changes noted. Discussed hydration and electrolyte balance, especially during diarrhea episodes. Emphasized potassium supplementation on days with diarrhea to prevent hypokalemia. Discussed non-alcohol mouthwash and baking soda for oral care to manage oral mucositis. Advised on gloves and lotion for chemotherapy-induced dry skin. - Continue current chemotherapy regimen - Monitor CBC and tumor markers every four weeks - Encourage hydration with water and electrolytes - Take potassium supplements, especially on days with diarrhea - Consider multivitamin if not consuming enough fruits and vegetables - Use non-alcohol mouthwash and baking soda for oral care - Apply Neosporin to any open wounds - Wear gloves and use lotion for dry skin - Schedule next appointments for December 9 and December 31  Chemotherapy-Induced Diarrhea Diarrhea lasting seven days with 3-5 bowel movements per day, managed with Imodium. Discussed hydration and electrolyte balance during diarrhea  episodes. - Continue using Imodium as  needed - Ensure adequate hydration with water and electrolytes  Hypokalemia Potassium low at 3.2. Takes potassium supplements daily, especially on days with diarrhea. Emphasized increased potassium intake during diarrhea episodes to prevent hypokalemia. - Continue potassium supplements, increase dose on days with diarrhea  Chemotherapy-Induced Neuropathy Neuropathy symptoms well-managed. Cold sensation in feet, especially distal parts, with some numbness. Symptoms attributed to previous chemotherapy. Discussed measures to keep feet warm and monitor symptoms. - Monitor neuropathy symptoms - Advise on measures to keep feet warm  Oral Mucositis Top of mouth tender, no significant pain. Uses non-alcohol mouthwash but not baking soda. Previous lidocaine mouthwash caused nausea and vomiting. Discussed baking soda for oral care if needed. - Use non-alcohol mouthwash - Consider using baking soda for oral care if needed  Plan -Lab reviewed, adequate for treatment, will proceed same dose today and continue every 2 weeks -Follow-up in 2 weeks -Due to persistent hypokalemia, I encouraged him to increase potassium to twice daily     SUMMARY OF ONCOLOGIC HISTORY: Oncology History Overview Note   Cancer Staging  Pancreatic cancer metastasized to liver [facility]) Staging form: Exocrine Pancreas, AJCC 8th Edition - Clinical stage from 02/28/2022: Stage IV (cT2, cN1, pM1) - Signed by [name], MD on 03/02/2022 Stage prefix: Initial diagnosis     Pancreatic cancer metastasized to liver (HCC)  02/28/2022 Cancer Staging   Staging form: Exocrine Pancreas, AJCC 8th Edition - Clinical stage from 02/28/2022: Stage IV (cT2, cN1, pM1) - Signed by [name], MD on 03/02/2022 Stage prefix: Initial diagnosis   03/02/2022 Initial Diagnosis   Pancreatic cancer metastasized to liver (HCC)   03/13/2022 - 04/13/2022 Chemotherapy   Patient is on Treatment Plan : PANCREAS Modified FOLFIRINOX q14d x 4 cycles      03/13/2022 - 05/17/2022 Chemotherapy   Patient is on Treatment Plan : PANCREAS Modified FOLFIRINOX q14d x 4 cycles     03/18/2022 Genetic Testing   Negative genetic testing on the CancerNext-Expanded+RNAinsight panel.  APC [id number] VUs identified.  The report date is March 18, 2022.  The CancerNext-Expanded gene panel offered by [facility] and includes sequencing and rearrangement analysis for the following 77 genes: AIP, ALK, APC*, ATM*, AXIN2, BAP1, BARD1, BLM, BMPR1A, BRCA1*, BRCA2*, BRIP1*, CDC73, CDH1*, CDK4, CDKN1B, CDKN2A, CHEK2*, CTNNA1, DICER1, FANCC, FH, FLCN, GALNT12, KIF1B, LZTR1, MAX, MEN1, MET, MLH1*, MSH2*, MSH3, MSH6*, MUTYH*, NBN, NF1*, NF2, NTHL1, PALB2*, PHOX2B, PMS2*, POT1, PRKAR1A, PTCH1, PTEN*, RAD51C*, RAD51D*, RB1, RECQL, RET, SDHA, SDHAF2, SDHB, SDHC, SDHD, SMAD4, SMARCA4, SMARCB1, SMARCE1, STK11, SUFU, TMEM127, TP53*, TSC1, TSC2, VHL and XRCC2 (sequencing and deletion/duplication); EGFR, EGLN1, HOXB13, KIT, MITF, PDGFRA, POLD1, and POLE (sequencing only); EPCAM and GREM1 (deletion/duplication only). DNA and RNA analyses performed for * genes.    05/25/2022 Progression   Rising CA 19-9, CT CAP IMPRESSION: 1. Multiple new and enlarged hypodense, rim enhancing liver metastases. 2. Unchanged, ill-defined hypoenhancing mass of the pancreatic uncinate, consistent with pancreatic adenocarcinoma 3. Unchanged small prominent portacaval and gastrohepatic ligament nodes, modestly suspicious for nodal metastases. No overtly enlarged lymph nodes in the abdomen or pelvis. 4. Prostatomegaly.    06/05/2022 - 11/13/2022 Chemotherapy   Patient is on Treatment Plan : PANCREATIC Abraxane D1,8,15 + Gemcitabine D1,8,15 q28d     11/24/2022 Imaging   IMPRESSION: 1. Today's study demonstrates mild progression of disease as evidenced by slight enlargement of the mass in the uncinate process of the pancreas which demonstrates potential direct invasion into the superior wall of the third  portion of  the duodenum, but currently demonstrates no definite vascular involvement. Multiple hepatic lesions are generally stable to the prior study, with exception of enlargement of a lesion in segment 8, as detailed above. 2. No new sites of metastatic disease are noted elsewhere in the chest or pelvis. 3. Aortic atherosclerosis.  Rising CA 19-9    12/04/2022 -  Chemotherapy   Patient is on Treatment Plan : PANCREAS Liposomal Irinotecan + Leucovorin + 5-FU IVCI q14d     03/06/2023 Imaging    IMPRESSION: 1. Mass in the pancreatic head demonstrates decreased size since prior study. 2. Multiple liver metastasis are mildly increased in size. No new lesions. 3. 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 Metastatic disease would be less likely. 4. Mild aortic atherosclerosis. 5. Nonspecific sclerotic foci in the pelvis, unchanged, likely benign bone islands.        Discussed the use of AI scribe software for clinical note transcription with the patient, who gave verbal consent to proceed.  History of Present Illness   A 57 year old male with a history of pancreatic cancer presents for a follow-up visit. He reports that his last cycle of chemotherapy was similar to previous ones, but he noticed a change in his diarrhea. He experienced diarrhea for about a week, starting on a Friday and lasting until the following Friday. He estimates having three to five bowel movements per day during this period. He managed his symptoms with over-the-counter Imodium, which he took as needed, usually closer to bedtime to prevent nighttime disruptions. He also has a prescription for Lomotil, but he stopped taking it because it made him nauseous and led to vomiting.  The patient also reports having mouth sores, which he manages with a non-alcohol mouthwash. He has not been using  baking soda as previously suggested. He also mentions that the top of his mouth is becoming more tender.  In addition, the patient has neuropathy, which he describes as a cold sensation in his feet. He reports that the cold part of his feet feels numb, but he can still feel something. He also mentions that his nose often drips without him noticing until it has already started.  The patient also mentions that he has been taking potassium supplements, mainly on the days when he has diarrhea. He also takes vitamin B12 and iron supplements. He has been eating canned pineapple chunks in juice and asks if he should be taking any additional vitamins.         All other systems were reviewed with the patient and are negative.  MEDICAL HISTORY:  Past Medical History:  Diagnosis Date   Family history of adverse reaction to anesthesia    mother allergic to ether    Family history of breast cancer    Family history of colon cancer    Pneumonia    hx of 2014     SURGICAL HISTORY: Past Surgical History:  Procedure Laterality Date   APPENDECTOMY     CHOLECYSTECTOMY N/A 07/12/2018   Procedure: LAPAROSCOPIC CHOLECYSTECTOMY WITH INTRAOPERATIVE CHOLANGIOGRAM;  Surgeon: Luretha Murphy, MD;  Location: [facility];  Service: General;  Laterality: N/A;   left wrist surgery      has plate in arm    NASAL POLYP SURGERY  2017   PORTACATH PLACEMENT N/A 03/10/2022   Procedure: INSERTION PORT-A-CATH;  Surgeon: Fritzi Mandes, MD;  Location: [facility];  Service:  General;  Laterality: N/A;   VENTRAL HERNIA REPAIR N/A 07/15/2014   Procedure: LAPAROSCOPIC REPAIR INCARCARTED UMBILICAL  VENTRAL HERNIA, ;  Surgeon: Claud Kelp, MD;  Location: [facility];  Service: General;  Laterality: N/A;  With MESH    I have reviewed the social history and family history with the patient and they are unchanged from previous note.  ALLERGIES:  has No Known Allergies.  MEDICATIONS:  Current Outpatient Medications  Medication Sig  Dispense Refill   apixaban (ELIQUIS) 5 MG TABS tablet Take 1 tablet (5 mg total) by mouth 2 (two) times daily. 60 tablet 3   KLOR-CON M20 20 MEQ tablet TAKE 1 TAB (20 MEQ) 2X DAILY FOR 7 DAYS AND THEN TAKE 1 TAB DAILY FOR REMAINDER OF THE MONTH. 37 tablet 0   lidocaine-prilocaine (EMLA) cream Apply to affected area once 30 g 3   lipase/protease/amylase (CREON) 36000 UNITS CPEP capsule Take 2 capsules (72,000 Units total) by mouth 3 (three) times daily with meals. May also take 1 capsule (36,000 Units total) as needed (with snacks). 240 capsule 3   loperamide (IMODIUM A-D) 2 MG tablet Take 2 mg by mouth 4 (four) times daily as needed for diarrhea or loose stools.     ondansetron (ZOFRAN) 8 MG tablet Take 1 tablet (8 mg total) by mouth every 8 (eight) hours as needed for nausea or vomiting. Start on the third day after irinotecan (Patient not taking: Reported on 04/10/2023) 30 tablet 1   pantoprazole (PROTONIX) 40 MG tablet Take 1 tablet (40 mg total) by mouth daily. 30 tablet 1   prochlorperazine (COMPAZINE) 10 MG tablet Take 1 tablet (10 mg total) by mouth every 6 (six) hours as needed for nausea or vomiting. (Patient not taking: Reported on 04/10/2023) 30 tablet 1   valACYclovir (VALTREX) 500 MG tablet Take 1 tablet (500 mg total) by mouth 2 (two) times daily. 60 tablet 3   No current facility-administered medications for this visit.   Facility-Administered Medications Ordered in Other Visits  Medication Dose Route Frequency Provider Last Rate Last Admin   atropine injection 0.5 mg  0.5 mg Intravenous Once PRN [name], MD       fluorouracil (ADRUCIL) 5,900 mg in sodium chloride 0.9 % 132 mL chemo infusion  2,400 mg/m2 (Treatment Plan Recorded) Intravenous 1 day or 1 dose [name], MD       irinotecan LIPOSOME (ONIVYDE) 146.2 mg in sodium chloride 0.9 % 500 mL chemo infusion  60 mg/m2 (Treatment Plan Recorded) Intravenous Once [name], MD       leucovorin 980 mg in sodium chloride 0.9 % 250 mL  infusion  400 mg/m2 (Treatment Plan Recorded) Intravenous Once [name], MD        PHYSICAL EXAMINATION: ECOG PERFORMANCE STATUS: 1 - Symptomatic but completely ambulatory  Vitals:   07/16/23 1054  BP: 113/81  Pulse: 65  Resp: 18  Temp: (!) 97.2 F (36.2 C)  SpO2: 99%   Wt Readings from Last 3 Encounters:  07/16/23 235 lb 4 oz (106.7 kg)  07/02/23 238 lb 4.8 oz (108.1 kg)  06/18/23 235 lb 11.2 oz (106.9 kg)     GENERAL:alert, no distress and comfortable SKIN: skin color, texture, turgor are normal, no rashes or significant lesions EYES: normal, Conjunctiva are pink and non-injected, sclera clear NECK: supple, thyroid normal size, non-tender, without nodularity LYMPH:  no palpable lymphadenopathy in the cervical, axillary  LUNGS: clear to auscultation and percussion with normal breathing effort HEART: regular rate &  rhythm and no murmurs and no lower extremity edema ABDOMEN:abdomen soft, non-tender and normal bowel sounds Musculoskeletal:no cyanosis of digits and no clubbing  NEURO: alert & oriented x 3 with fluent speech, no focal motor/sensory deficits    LABORATORY DATA:  I have reviewed the data as listed    Latest Ref Rng & Units 07/16/2023   10:38 AM 07/02/2023   12:02 PM 06/18/2023   10:34 AM  CBC  WBC 4.0 - 10.5 K/uL 5.1  6.4  5.9   Hemoglobin 13.0 - 17.0 g/dL 78.4  69.6  29.5   Hematocrit 39.0 - 52.0 % 31.0  30.0  31.6   Platelets 150 - 400 K/uL 267  312  296         Latest Ref Rng & Units 07/16/2023   10:38 AM 07/02/2023   12:02 PM 06/18/2023   10:34 AM  CMP  Glucose 70 - 99 mg/dL 284  132  440   BUN 6 - 20 mg/dL 7  8  9    Creatinine 0.61 - 1.24 mg/dL 1.02  7.25  3.66   Sodium 135 - 145 mmol/L 138  139  137   Potassium 3.5 - 5.1 mmol/L 3.1  3.2  3.0   Chloride 98 - 111 mmol/L 106  107  105   CO2 22 - 32 mmol/L 26  26  26    Calcium 8.9 - 10.3 mg/dL 8.5  8.4  8.8   Total Protein 6.5 - 8.1 g/dL 6.1  5.7  6.1   Total Bilirubin <1.2 mg/dL 0.4  0.4   0.5   Alkaline Phos 38 - 126 U/L 107  80  101   AST 15 - 41 U/L 18  17  21    ALT 0 - 44 U/L 32  17  34       RADIOGRAPHIC STUDIES: I have personally reviewed the radiological images as listed and agreed with the findings in the report. No results found.    Orders Placed This Encounter  Procedures   Cancer antigen 19-9    Standing Status:   Standing    Number of Occurrences:   20    Standing Expiration Date:   07/15/2024   CBC with Differential (Cancer Center Only)    Standing Status:   Future    Standing Expiration Date:   09/02/2024   CMP (Cancer Center only)    Standing Status:   Future    Standing Expiration Date:   09/02/2024   All questions were answered. The patient knows to call the clinic with any problems, questions or concerns. No barriers to learning was detected. The total time spent in the appointment was 25 minutes.     [name], MD 07/16/2023

## 2023-07-17 LAB — CANCER ANTIGEN 19-9: CA 19-9: 91 U/mL — ABNORMAL HIGH (ref 0–35)

## 2023-07-18 ENCOUNTER — Inpatient Hospital Stay: Payer: BC Managed Care – PPO

## 2023-07-18 DIAGNOSIS — C250 Malignant neoplasm of head of pancreas: Secondary | ICD-10-CM | POA: Diagnosis not present

## 2023-07-18 DIAGNOSIS — K521 Toxic gastroenteritis and colitis: Secondary | ICD-10-CM | POA: Diagnosis not present

## 2023-07-18 DIAGNOSIS — C787 Secondary malignant neoplasm of liver and intrahepatic bile duct: Secondary | ICD-10-CM | POA: Diagnosis not present

## 2023-07-18 DIAGNOSIS — K219 Gastro-esophageal reflux disease without esophagitis: Secondary | ICD-10-CM | POA: Diagnosis not present

## 2023-07-18 DIAGNOSIS — E876 Hypokalemia: Secondary | ICD-10-CM | POA: Diagnosis not present

## 2023-07-18 DIAGNOSIS — K1230 Oral mucositis (ulcerative), unspecified: Secondary | ICD-10-CM | POA: Diagnosis not present

## 2023-07-18 DIAGNOSIS — Z5111 Encounter for antineoplastic chemotherapy: Secondary | ICD-10-CM | POA: Diagnosis not present

## 2023-07-18 DIAGNOSIS — C259 Malignant neoplasm of pancreas, unspecified: Secondary | ICD-10-CM

## 2023-07-18 DIAGNOSIS — R5383 Other fatigue: Secondary | ICD-10-CM | POA: Diagnosis not present

## 2023-07-18 DIAGNOSIS — G620 Drug-induced polyneuropathy: Secondary | ICD-10-CM | POA: Diagnosis not present

## 2023-07-18 DIAGNOSIS — Z452 Encounter for adjustment and management of vascular access device: Secondary | ICD-10-CM | POA: Diagnosis not present

## 2023-07-18 MED ORDER — HEPARIN SOD (PORK) LOCK FLUSH 100 UNIT/ML IV SOLN
500.0000 [IU] | Freq: Once | INTRAVENOUS | Status: AC | PRN
  Administered 2023-07-18: 500 [IU]

## 2023-07-18 MED ORDER — SODIUM CHLORIDE 0.9% FLUSH
10.0000 mL | INTRAVENOUS | Status: DC | PRN
  Administered 2023-07-18: 10 mL

## 2023-07-25 ENCOUNTER — Other Ambulatory Visit: Payer: Self-pay

## 2023-07-29 NOTE — Assessment & Plan Note (Signed)
metastatic to liver, stage IV, KRAS G12R (+), MMR normal  -incidental finding on lung cancer screening chest CT on 01/19/22. 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  -He began first-line palliative FOLFIRINOX on 03/13/22, but unfortunately did not respond well. CA 19-9 also rose on treatment. -he switched to second line gemcitabine/abraxane on 06/05/22. He tolerates moderate well and has continued to work full time. CA 19-9 has dropped on treatment. -restaging CT from 08/24/22 showed partial response to treatment, improved primary tumor, and liver/node mets, I discussed with him  -Pt was referred to see Dr. [name] on 10/10/2022 , surgery was discussed but not felt to be a surgical candidate due to his multiple liver metastasis (5) -Restaging CT scan on November 24, 2022 showed moderate disease progression in liver and the pancreas, his tumor marker also increased.  I have changed his chemotherapy to 5-FU and liposomal irinotecan, started on 12/04/22, he is tolerating well  -Restaging scan from March 06, 2023 showed slightly decreased pancreatic mass, but he has slight progression in liver metastasis.  Has been trending up lately, also indicating a mild progression. -We discussed there is very limited treatment option at this point, I would recommend him to try docetaxel, gemcitabine and capecitabine as next line therapy. -pt is clinically doing well, given the limited treatment options, we decided to continue current therapy until he has further disease progression. -I reviewed his Guardant360 results, which is negative for targetable mutations  -restaging CT on 05/30/2023 showed overall stable disease, but probably a tiny new liver metastatic lesion.  His tumor marker has slightly increased lately.  Given the limited treatment options, I will continue his current therapy now and repeat CT in  early Jan  2025

## 2023-07-30 ENCOUNTER — Inpatient Hospital Stay: Payer: BC Managed Care – PPO | Admitting: Hematology

## 2023-07-30 ENCOUNTER — Inpatient Hospital Stay: Payer: BC Managed Care – PPO | Attending: Hematology

## 2023-07-30 ENCOUNTER — Inpatient Hospital Stay: Payer: BC Managed Care – PPO

## 2023-07-30 VITALS — BP 120/72 | HR 72 | Temp 97.1°F | Resp 15 | Wt 234.1 lb

## 2023-07-30 DIAGNOSIS — C259 Malignant neoplasm of pancreas, unspecified: Secondary | ICD-10-CM

## 2023-07-30 DIAGNOSIS — I700 Atherosclerosis of aorta: Secondary | ICD-10-CM | POA: Diagnosis not present

## 2023-07-30 DIAGNOSIS — R112 Nausea with vomiting, unspecified: Secondary | ICD-10-CM | POA: Insufficient documentation

## 2023-07-30 DIAGNOSIS — C250 Malignant neoplasm of head of pancreas: Secondary | ICD-10-CM | POA: Insufficient documentation

## 2023-07-30 DIAGNOSIS — C787 Secondary malignant neoplasm of liver and intrahepatic bile duct: Secondary | ICD-10-CM | POA: Diagnosis not present

## 2023-07-30 DIAGNOSIS — Z452 Encounter for adjustment and management of vascular access device: Secondary | ICD-10-CM | POA: Diagnosis not present

## 2023-07-30 DIAGNOSIS — Z5111 Encounter for antineoplastic chemotherapy: Secondary | ICD-10-CM | POA: Diagnosis not present

## 2023-07-30 DIAGNOSIS — Z95828 Presence of other vascular implants and grafts: Secondary | ICD-10-CM

## 2023-07-30 DIAGNOSIS — J329 Chronic sinusitis, unspecified: Secondary | ICD-10-CM | POA: Insufficient documentation

## 2023-07-30 DIAGNOSIS — Z7901 Long term (current) use of anticoagulants: Secondary | ICD-10-CM | POA: Diagnosis not present

## 2023-07-30 LAB — CBC WITH DIFFERENTIAL (CANCER CENTER ONLY)
Abs Immature Granulocytes: 0.02 10*3/uL (ref 0.00–0.07)
Basophils Absolute: 0.1 10*3/uL (ref 0.0–0.1)
Basophils Relative: 1 %
Eosinophils Absolute: 0.7 10*3/uL — ABNORMAL HIGH (ref 0.0–0.5)
Eosinophils Relative: 11 %
HCT: 32.3 % — ABNORMAL LOW (ref 39.0–52.0)
Hemoglobin: 11 g/dL — ABNORMAL LOW (ref 13.0–17.0)
Immature Granulocytes: 0 %
Lymphocytes Relative: 41 %
Lymphs Abs: 2.3 10*3/uL (ref 0.7–4.0)
MCH: 31.3 pg (ref 26.0–34.0)
MCHC: 34.1 g/dL (ref 30.0–36.0)
MCV: 92 fL (ref 80.0–100.0)
Monocytes Absolute: 0.5 10*3/uL (ref 0.1–1.0)
Monocytes Relative: 8 %
Neutro Abs: 2.2 10*3/uL (ref 1.7–7.7)
Neutrophils Relative %: 39 %
Platelet Count: 292 10*3/uL (ref 150–400)
RBC: 3.51 MIL/uL — ABNORMAL LOW (ref 4.22–5.81)
RDW: 15.4 % (ref 11.5–15.5)
WBC Count: 5.7 10*3/uL (ref 4.0–10.5)
nRBC: 0 % (ref 0.0–0.2)

## 2023-07-30 LAB — CMP (CANCER CENTER ONLY)
ALT: 17 U/L (ref 0–44)
AST: 17 U/L (ref 15–41)
Albumin: 3.6 g/dL (ref 3.5–5.0)
Alkaline Phosphatase: 90 U/L (ref 38–126)
Anion gap: 7 (ref 5–15)
BUN: 8 mg/dL (ref 6–20)
CO2: 25 mmol/L (ref 22–32)
Calcium: 8.8 mg/dL — ABNORMAL LOW (ref 8.9–10.3)
Chloride: 105 mmol/L (ref 98–111)
Creatinine: 0.94 mg/dL (ref 0.61–1.24)
GFR, Estimated: >60 mL/min (ref >60)
Glucose, Bld: 133 mg/dL — ABNORMAL HIGH (ref 70–99)
Potassium: 3.4 mmol/L — ABNORMAL LOW (ref 3.5–5.1)
Sodium: 137 mmol/L (ref 135–145)
Total Bilirubin: 0.4 mg/dL (ref <1.2)
Total Protein: 6.1 g/dL — ABNORMAL LOW (ref 6.5–8.1)

## 2023-07-30 MED ORDER — ATROPINE SULFATE 1 MG/ML IV SOLN
0.5000 mg | Freq: Once | INTRAVENOUS | Status: AC | PRN
  Administered 2023-07-30: 0.5 mg via INTRAVENOUS
  Filled 2023-07-30: qty 1

## 2023-07-30 MED ORDER — DEXAMETHASONE SODIUM PHOSPHATE 10 MG/ML IJ SOLN
10.0000 mg | Freq: Once | INTRAMUSCULAR | Status: AC
  Administered 2023-07-30: 10 mg via INTRAVENOUS
  Filled 2023-07-30: qty 1

## 2023-07-30 MED ORDER — SODIUM CHLORIDE 0.9% FLUSH
10.0000 mL | INTRAVENOUS | Status: DC | PRN
  Administered 2023-07-30: 10 mL

## 2023-07-30 MED ORDER — SODIUM CHLORIDE 0.9 % IV SOLN
400.0000 mg/m2 | Freq: Once | INTRAVENOUS | Status: AC
  Administered 2023-07-30: 980 mg via INTRAVENOUS
  Filled 2023-07-30: qty 49

## 2023-07-30 MED ORDER — SODIUM CHLORIDE 0.9% FLUSH
10.0000 mL | Freq: Once | INTRAVENOUS | Status: AC
  Administered 2023-07-30: 10 mL

## 2023-07-30 MED ORDER — SODIUM CHLORIDE 0.9 % IV SOLN
60.0000 mg/m2 | Freq: Once | INTRAVENOUS | Status: AC
  Administered 2023-07-30: 146.2 mg via INTRAVENOUS
  Filled 2023-07-30: qty 34

## 2023-07-30 MED ORDER — PALONOSETRON HCL INJECTION 0.25 MG/5ML
0.2500 mg | Freq: Once | INTRAVENOUS | Status: AC
  Administered 2023-07-30: 0.25 mg via INTRAVENOUS
  Filled 2023-07-30: qty 5

## 2023-07-30 MED ORDER — SODIUM CHLORIDE 0.9 % IV SOLN
2400.0000 mg/m2 | INTRAVENOUS | Status: DC
  Administered 2023-07-30: 5900 mg via INTRAVENOUS
  Filled 2023-07-30: qty 118

## 2023-07-30 MED ORDER — SODIUM CHLORIDE 0.9 % IV SOLN: Freq: Once | INTRAVENOUS | Status: AC

## 2023-07-30 NOTE — Patient Instructions (Signed)
[facility] - A DEPT OF [facility]  Discharge Instructions: Thank you for choosing [facility] to provide your oncology and hematology care.   If you have a lab appointment with the Cancer Center, please go directly to the Cancer Center and check in at the registration area.   Wear comfortable clothing and clothing appropriate for easy access to any Portacath or PICC line.   We strive to give you quality time with your provider. You may need to reschedule your appointment if you arrive late (15 or more minutes).  Arriving late affects you and other patients whose appointments are after yours.  Also, if you miss three or more appointments without notifying the office, you may be dismissed from the clinic at the provider's discretion.      For prescription refill requests, have your pharmacy contact our office and allow 72 hours for refills to be completed.    Today you received the following chemotherapy and/or immunotherapy agents: Irinotecan Liposome/Leucovorin/Fluorouracil       To help prevent nausea and vomiting after your treatment, we encourage you to take your nausea medication as directed.  BELOW ARE SYMPTOMS THAT SHOULD BE REPORTED IMMEDIATELY: *FEVER GREATER THAN 100.4 F (38 C) OR HIGHER *CHILLS OR SWEATING *NAUSEA AND VOMITING THAT IS NOT CONTROLLED WITH YOUR NAUSEA MEDICATION *UNUSUAL SHORTNESS OF BREATH *UNUSUAL BRUISING OR BLEEDING *URINARY PROBLEMS (pain or burning when urinating, or frequent urination) *BOWEL PROBLEMS (unusual diarrhea, constipation, pain near the anus) TENDERNESS IN MOUTH AND THROAT WITH OR WITHOUT PRESENCE OF ULCERS (sore throat, sores in mouth, or a toothache) UNUSUAL RASH, SWELLING OR PAIN  UNUSUAL VAGINAL DISCHARGE OR ITCHING   Items with * indicate a potential emergency and should be followed up as soon as possible or go to the Emergency Department if any problems should occur.  Please show the  CHEMOTHERAPY ALERT CARD or IMMUNOTHERAPY ALERT CARD at check-in to the Emergency Department and triage nurse.  Should you have questions after your visit or need to cancel or reschedule your appointment, please contact [facility] - A DEPT OF [facility]  Dept: [phone]  and follow the prompts.  Office hours are 8:00 a.m. to 4:30 p.m. Monday - Friday. Please note that voicemails left after 4:00 p.m. may not be returned until the following business day.  We are closed weekends and major holidays. You have access to a nurse at all times for urgent questions. Please call the main number to the clinic Dept: [phone] and follow the prompts.   For any non-urgent questions, you may also contact your provider using MyChart. We now offer e-Visits for anyone 34 and older to request care online for non-urgent symptoms. For details visit [email or web address].   Also download the MyChart app! Go to the app store, search "MyChart", open the app, select [place], and log in with your MyChart username and password.

## 2023-07-30 NOTE — Progress Notes (Signed)
[facility]   Telephone:([phone]) [phone] Fax:([phone]   Clinic Follow up Note   Patient Care Team: [name], MD as PCP - General (Family Medicine) [name], MD as Consulting Physician (Oncology) [name], Revonda Standard., MD as Consulting Physician (General Surgery) [facility] as Radiologist (Radiology)  Date of Service:  07/30/2023  CHIEF COMPLAINT: f/u of metastatic pancreatic cancer  CURRENT THERAPY:  Third line chemotherapy liposomal irinotecan and 5-FU  Oncology History   Pancreatic cancer metastasized to liver [facility]) metastatic to liver, stage IV, KRAS G12R (+), MMR normal  -incidental finding on lung cancer screening chest CT on 01/19/22. 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  -He began first-line palliative FOLFIRINOX on 03/13/22, but unfortunately did not respond well. CA 19-9 also rose on treatment. -he switched to second line gemcitabine/abraxane on 06/05/22. He tolerates moderate well and has continued to work full time. CA 19-9 has dropped on treatment. -restaging CT from 08/24/22 showed partial response to treatment, improved primary tumor, and liver/node mets, I discussed with him  -Pt was referred to see Dr. [name] on 10/10/2022 , surgery was discussed but not felt to be a surgical candidate due to his multiple liver metastasis (5) -Restaging CT scan on November 24, 2022 showed moderate disease progression in liver and the pancreas, his tumor marker also increased.  I have changed his chemotherapy to 5-FU and liposomal irinotecan, started on 12/04/22, he is tolerating well  -Restaging scan from March 06, 2023 showed slightly decreased pancreatic mass, but he has slight progression in liver metastasis.  Has been trending up lately, also indicating a mild progression. -We discussed there is very limited treatment option at this  point, I would recommend him to try docetaxel, gemcitabine and capecitabine as next line therapy. -pt is clinically doing well, given the limited treatment options, we decided to continue current therapy until he has further disease progression. -I reviewed his Guardant360 results, which is negative for targetable mutations  -restaging CT on 05/30/2023 showed overall stable disease, but probably a tiny new liver metastatic lesion.  His tumor marker has slightly increased lately.  Given the limited treatment options, I will continue his current therapy now and repeat CT in  early Jan 2025   Assessment and Plan    Metastatic Pancreatic Cancer Follow-up for metastatic pancreatic cancer. Creon has improved bowel movements, reducing discomfort. No significant acid reflux or heartburn. Currently on chemotherapy, causing dry skin and delayed healing. Blood counts stable, tumor marker at 91, indicating overall stability. Last scan in October, follow-up scan due in January. Discussed potential financial assistance for Creon. - Order CT scan for the second week of January - Schedule follow-up appointment on September 04, 2023  Chemotherapy-Induced Dry Skin Dry skin related to chemotherapy, slowing healing. Using Neosporin and a moisturizer. - Continue Neosporin for skin spots - Use moisturizer regularly, especially after washing hands  Chemotherapy-Induced Nausea Occasional nausea when port is accessed, sometimes vomits during port flush and at the end of chemotherapy. Adjustments in port flush technique have reduced nausea. - Monitor nausea and vomiting during port access and chemotherapy sessions  Chronic Sinusitis Constant clear nasal drip, not taking allergy medication. Adverse reactions to Flonase in the past. - Try over-the-counter allergy medication like Claritin  General Health Maintenance On Eliquis without significant bleeding issues. Using baking soda for oral hygiene due to irritation from  toothpaste. - Continue Eliquis - Use baking soda for oral hygiene - Consider using alcohol-free mouthwash.      Plan -Lab reviewed, adequate for treatment, will proceed chemo today -Postpone next treatment for week due to holiday -Plan to repeat a CT scan in early January, ordered today    SUMMARY OF ONCOLOGIC HISTORY: Oncology History Overview Note   Cancer Staging  Pancreatic cancer metastasized to liver [facility]) Staging form: Exocrine Pancreas, AJCC 8th Edition - Clinical stage from 02/28/2022: Stage IV (cT2, cN1, pM1) - Signed by [name], MD on 03/02/2022 Stage prefix: Initial diagnosis     Pancreatic cancer metastasized to liver (HCC)  02/28/2022 Cancer Staging   Staging form: Exocrine Pancreas, AJCC 8th Edition - Clinical stage from 02/28/2022: Stage IV (cT2, cN1, pM1) - Signed by [name], MD on 03/02/2022 Stage prefix: Initial diagnosis   03/02/2022 Initial Diagnosis   Pancreatic cancer metastasized to liver (HCC)   03/13/2022 - 04/13/2022 Chemotherapy   Patient is on Treatment Plan : PANCREAS Modified FOLFIRINOX q14d x 4 cycles     03/13/2022 - 05/17/2022 Chemotherapy   Patient is on Treatment Plan : PANCREAS Modified FOLFIRINOX q14d x 4 cycles     03/18/2022 Genetic Testing   Negative genetic testing on the CancerNext-Expanded+RNAinsight panel.  APC [id number] VUs identified.  The report date is March 18, 2022.  The CancerNext-Expanded gene panel offered by [facility] and includes sequencing and rearrangement analysis for the following 77 genes: AIP, ALK, APC*, ATM*, AXIN2, BAP1, BARD1, BLM, BMPR1A, BRCA1*, BRCA2*, BRIP1*, CDC73, CDH1*, CDK4, CDKN1B, CDKN2A, CHEK2*, CTNNA1, DICER1, FANCC, FH, FLCN, GALNT12, KIF1B, LZTR1, MAX, MEN1, MET, MLH1*, MSH2*, MSH3, MSH6*, MUTYH*, NBN, NF1*, NF2, NTHL1, PALB2*, PHOX2B, PMS2*, POT1, PRKAR1A, PTCH1, PTEN*, RAD51C*, RAD51D*, RB1, RECQL, RET, SDHA, SDHAF2, SDHB, SDHC, SDHD, SMAD4, SMARCA4, SMARCB1, SMARCE1, STK11, SUFU, TMEM127, TP53*,  TSC1, TSC2, VHL and XRCC2 (sequencing and deletion/duplication); EGFR, EGLN1, HOXB13, KIT, MITF, PDGFRA, POLD1, and POLE (sequencing only); EPCAM and GREM1 (deletion/duplication only). DNA and RNA analyses performed for * genes.    05/25/2022 Progression   Rising CA 19-9, CT CAP IMPRESSION: 1. Multiple new and enlarged hypodense, rim enhancing liver metastases. 2. Unchanged, ill-defined hypoenhancing mass of the pancreatic uncinate, consistent with pancreatic adenocarcinoma 3. Unchanged small prominent portacaval and gastrohepatic ligament nodes, modestly suspicious for nodal metastases. No overtly enlarged lymph nodes in the abdomen or pelvis. 4. Prostatomegaly.    06/05/2022 - 11/13/2022 Chemotherapy   Patient is on Treatment Plan : PANCREATIC Abraxane D1,8,15 + Gemcitabine D1,8,15 q28d     11/24/2022 Imaging   IMPRESSION: 1. Today's study demonstrates mild progression of disease as evidenced by slight enlargement of the mass in the uncinate process of the pancreas which demonstrates potential direct invasion into the superior wall of the third portion of the duodenum, but currently demonstrates no definite vascular involvement. Multiple hepatic lesions are generally stable to the prior study, with exception of enlargement of a lesion in segment 8, as detailed above. 2. No new sites of metastatic disease are noted elsewhere in the chest or pelvis. 3. Aortic atherosclerosis.  Rising CA 19-9    12/04/2022 -  Chemotherapy   Patient is on Treatment Plan : PANCREAS Liposomal Irinotecan + Leucovorin + 5-FU IVCI q14d     03/06/2023 Imaging    IMPRESSION: 1. Mass in the pancreatic head demonstrates decreased size since prior study. 2. Multiple liver metastasis are mildly increased in size. No new lesions. 3. 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 Metastatic  disease would be less likely. 4. Mild aortic atherosclerosis. 5. Nonspecific sclerotic foci in the pelvis, unchanged, likely benign bone islands.        Discussed the use of AI scribe software for clinical note transcription with the patient, who gave verbal consent to proceed.  History of Present Illness   The patient, a 57 year old with metastatic pancreatic cancer, presents for a routine follow-up. He reports no significant acid reflux or heartburn, and has not been taking Protonix. He has been taking Creon, which has significantly improved his bowel movements, reducing the previously reported scratchy and painful sensation. He denies any out-of-pocket expenses for Creon.  The patient is also on Eliquis, with no reported bleeding issues, although he notes that minor injuries such as nicks and scratches bleed more than usual. He has experienced some skin dryness, which he attributes to his chemotherapy, and has been using Neosporin and other creams for moisturization.  He has switched from toothpaste to baking soda for oral hygiene, as toothpaste was causing a burning sensation. He also uses mouthwash regularly.  The patient reports a constant runny nose, with clear discharge, which he has not been treating with any medication. He also reports some diarrhea, which he believes is related to his chemotherapy.  He has a port for chemotherapy administration, which sometimes causes nausea and vomiting, particularly during flushing. He has found that reducing the amount of saline used during flushing helps to mitigate these symptoms.         All other systems were reviewed with the patient and are negative.  MEDICAL HISTORY:  Past Medical History:  Diagnosis Date   Family history of adverse reaction to anesthesia    mother allergic to ether    Family history of breast cancer    Family history of colon cancer    Pneumonia    hx of 2014     SURGICAL HISTORY: Past Surgical History:   Procedure Laterality Date   APPENDECTOMY     CHOLECYSTECTOMY N/A 07/12/2018   Procedure: LAPAROSCOPIC CHOLECYSTECTOMY WITH INTRAOPERATIVE CHOLANGIOGRAM;  Surgeon: Luretha Murphy, MD;  Location: [facility] ORS;  Service: General;  Laterality: N/A;   left wrist surgery      has plate in arm    NASAL POLYP SURGERY  2017   PORTACATH PLACEMENT N/A 03/10/2022   Procedure: INSERTION PORT-A-CATH;  Surgeon: Fritzi Mandes, MD;  Location: [facility] OR;  Service: General;  Laterality: N/A;   VENTRAL HERNIA REPAIR N/A 07/15/2014   Procedure: LAPAROSCOPIC REPAIR INCARCARTED UMBILICAL  VENTRAL HERNIA, ;  Surgeon: Claud Kelp, MD;  Location: [facility];  Service: General;  Laterality: N/A;  With MESH    I have reviewed the social history and family history with the patient and they are unchanged from previous note.  ALLERGIES:  has No Known Allergies.  MEDICATIONS:  Current Outpatient Medications  Medication Sig Dispense Refill   apixaban (ELIQUIS) 5 MG TABS tablet Take 1 tablet (5 mg total) by mouth 2 (two) times daily. 60 tablet 3   KLOR-CON M20 20 MEQ tablet TAKE 1 TAB (20 MEQ) 2X DAILY FOR 7 DAYS AND THEN TAKE 1 TAB DAILY FOR REMAINDER OF THE MONTH. 37 tablet 0   lidocaine-prilocaine (EMLA) cream Apply to affected area once 30 g 3   lipase/protease/amylase (CREON) 36000 UNITS CPEP capsule Take 2 capsules (72,000 Units total) by mouth 3 (three) times daily with meals. May also  take 1 capsule (36,000 Units total) as needed (with snacks). 240 capsule 3   loperamide (IMODIUM A-D) 2 MG tablet Take 2 mg by mouth 4 (four) times daily as needed for diarrhea or loose stools.     ondansetron (ZOFRAN) 8 MG tablet Take 1 tablet (8 mg total) by mouth every 8 (eight) hours as needed for nausea or vomiting. Start on the third day after irinotecan (Patient not taking: Reported on 04/10/2023) 30 tablet 1   prochlorperazine (COMPAZINE) 10 MG tablet Take 1 tablet (10 mg total) by mouth every 6 (six) hours as needed for nausea or  vomiting. (Patient not taking: Reported on 04/10/2023) 30 tablet 1   valACYclovir (VALTREX) 500 MG tablet Take 1 tablet (500 mg total) by mouth 2 (two) times daily. 60 tablet 3   No current facility-administered medications for this visit.   Facility-Administered Medications Ordered in Other Visits  Medication Dose Route Frequency Provider Last Rate Last Admin   fluorouracil (ADRUCIL) 5,900 mg in sodium chloride 0.9 % 132 mL chemo infusion  2,400 mg/m2 (Treatment Plan Recorded) Intravenous 1 day or 1 dose [name], MD   Infusion Verify at 07/30/23 1438   sodium chloride flush (NS) 0.9 % injection 10 mL  10 mL Intracatheter PRN [name], MD   10 mL at 07/30/23 1432    PHYSICAL EXAMINATION: ECOG PERFORMANCE STATUS: 1 - Symptomatic but completely ambulatory  Vitals:   07/30/23 1111  BP: 120/72  Pulse: 72  Resp: 15  Temp: (!) 97.1 F (36.2 C)  SpO2: 95%   Wt Readings from Last 3 Encounters:  07/30/23 234 lb 1.6 oz (106.2 kg)  07/16/23 235 lb 4 oz (106.7 kg)  07/02/23 238 lb 4.8 oz (108.1 kg)     GENERAL:alert, no distress and comfortable SKIN: skin color, texture, turgor are normal, dry skin especially in his hands, a few shallow skin ulcers in his hands and arms EYES: normal, Conjunctiva are pink and non-injected, sclera clear NECK: supple, thyroid normal size, non-tender, without nodularity LYMPH:  no palpable lymphadenopathy in the cervical, axillary  LUNGS: clear to auscultation and percussion with normal breathing effort HEART: regular rate & rhythm and no murmurs and no lower extremity edema ABDOMEN:abdomen soft, non-tender and normal bowel sounds Musculoskeletal:no cyanosis of digits and no clubbing  NEURO: alert & oriented x 3 with fluent speech, no focal motor/sensory deficits    LABORATORY DATA:  I have reviewed the data as listed    Latest Ref Rng & Units 07/30/2023   10:39 AM 07/16/2023   10:38 AM 07/02/2023   12:02 PM  CBC  WBC 4.0 - 10.5 K/uL 5.7  5.1  6.4    Hemoglobin 13.0 - 17.0 g/dL 25.3  66.4  40.3   Hematocrit 39.0 - 52.0 % 32.3  31.0  30.0   Platelets 150 - 400 K/uL 292  267  312         Latest Ref Rng & Units 07/30/2023   10:39 AM 07/16/2023   10:38 AM 07/02/2023   12:02 PM  CMP  Glucose 70 - 99 mg/dL 474  259  563   BUN 6 - 20 mg/dL 8  7  8    Creatinine 0.61 - 1.24 mg/dL 8.75  6.43  3.29   Sodium 135 - 145 mmol/L 137  138  139   Potassium 3.5 - 5.1 mmol/L 3.4  3.1  3.2   Chloride 98 - 111 mmol/L 105  106  107   CO2 22 -  32 mmol/L 25  26  26    Calcium 8.9 - 10.3 mg/dL 8.8  8.5  8.4   Total Protein 6.5 - 8.1 g/dL 6.1  6.1  5.7   Total Bilirubin <1.2 mg/dL 0.4  0.4  0.4   Alkaline Phos 38 - 126 U/L 90  107  80   AST 15 - 41 U/L 17  18  17    ALT 0 - 44 U/L 17  32  17       RADIOGRAPHIC STUDIES: I have personally reviewed the radiological images as listed and agreed with the findings in the report. No results found.    Orders Placed This Encounter  Procedures   CT CHEST ABDOMEN PELVIS W CONTRAST    Standing Status:   Future    Standing Expiration Date:   07/29/2024    Order Specific Question:   If indicated for the ordered procedure, I authorize the administration of contrast media per Radiology protocol    Answer:   Yes    Order Specific Question:   Does the patient have a contrast media/X-ray dye allergy?    Answer:   No    Order Specific Question:   Preferred imaging location?    Answer:   [facility]    Order Specific Question:   If indicated for the ordered procedure, I authorize the administration of oral contrast media per Radiology protocol    Answer:   Yes   All questions were answered. The patient knows to call the clinic with any problems, questions or concerns. No barriers to learning was detected. The total time spent in the appointment was 25 minutes.     [name], MD 07/30/2023

## 2023-07-31 LAB — CANCER ANTIGEN 19-9: CA 19-9: 107 U/mL — ABNORMAL HIGH (ref 0–35)

## 2023-08-01 ENCOUNTER — Inpatient Hospital Stay: Payer: BC Managed Care – PPO

## 2023-08-01 ENCOUNTER — Encounter: Payer: Self-pay | Admitting: Hematology

## 2023-08-01 DIAGNOSIS — C250 Malignant neoplasm of head of pancreas: Secondary | ICD-10-CM | POA: Diagnosis not present

## 2023-08-01 DIAGNOSIS — Z7901 Long term (current) use of anticoagulants: Secondary | ICD-10-CM | POA: Diagnosis not present

## 2023-08-01 DIAGNOSIS — C787 Secondary malignant neoplasm of liver and intrahepatic bile duct: Secondary | ICD-10-CM | POA: Diagnosis not present

## 2023-08-01 DIAGNOSIS — R112 Nausea with vomiting, unspecified: Secondary | ICD-10-CM | POA: Diagnosis not present

## 2023-08-01 DIAGNOSIS — Z5111 Encounter for antineoplastic chemotherapy: Secondary | ICD-10-CM | POA: Diagnosis not present

## 2023-08-01 DIAGNOSIS — J329 Chronic sinusitis, unspecified: Secondary | ICD-10-CM | POA: Diagnosis not present

## 2023-08-01 DIAGNOSIS — Z95828 Presence of other vascular implants and grafts: Secondary | ICD-10-CM

## 2023-08-01 DIAGNOSIS — I700 Atherosclerosis of aorta: Secondary | ICD-10-CM | POA: Diagnosis not present

## 2023-08-01 DIAGNOSIS — Z452 Encounter for adjustment and management of vascular access device: Secondary | ICD-10-CM | POA: Diagnosis not present

## 2023-08-01 MED ORDER — HEPARIN SOD (PORK) LOCK FLUSH 100 UNIT/ML IV SOLN
500.0000 [IU] | Freq: Once | INTRAVENOUS | Status: AC
  Administered 2023-08-01: 500 [IU]

## 2023-08-01 MED ORDER — SODIUM CHLORIDE 0.9% FLUSH
10.0000 mL | Freq: Once | INTRAVENOUS | Status: AC
  Administered 2023-08-01: 10 mL

## 2023-08-05 DIAGNOSIS — C259 Malignant neoplasm of pancreas, unspecified: Secondary | ICD-10-CM | POA: Diagnosis not present

## 2023-08-05 DIAGNOSIS — C787 Secondary malignant neoplasm of liver and intrahepatic bile duct: Secondary | ICD-10-CM | POA: Diagnosis not present

## 2023-08-09 ENCOUNTER — Other Ambulatory Visit: Payer: Self-pay

## 2023-08-09 DIAGNOSIS — C259 Malignant neoplasm of pancreas, unspecified: Secondary | ICD-10-CM

## 2023-08-09 NOTE — Progress Notes (Signed)
Order for CT CAP w/contrast was sent to [facility].  Requested for pt to be contacted for an appt by 08/14/2023.  Order receipt confirmation received.

## 2023-08-10 ENCOUNTER — Other Ambulatory Visit: Payer: Self-pay

## 2023-08-20 NOTE — Assessment & Plan Note (Signed)
metastatic to liver, stage IV, KRAS G12R (+), MMR normal  -incidental finding on lung cancer screening chest CT on 01/19/22. 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  -He began first-line palliative FOLFIRINOX on 03/13/22, but unfortunately did not respond well. CA 19-9 also rose on treatment. -he switched to second line gemcitabine/abraxane on 06/05/22. He tolerates moderate well and has continued to work full time. CA 19-9 has dropped on treatment. -restaging CT from 08/24/22 showed partial response to treatment, improved primary tumor, and liver/node mets, I discussed with him  -Pt was referred to see Dr. [name] on 10/10/2022 , surgery was discussed but not felt to be a surgical candidate due to his multiple liver metastasis (5) -Restaging CT scan on November 24, 2022 showed moderate disease progression in liver and the pancreas, his tumor marker also increased.  I have changed his chemotherapy to 5-FU and liposomal irinotecan, started on 12/04/22, he is tolerating well  -Restaging scan from March 06, 2023 showed slightly decreased pancreatic mass, but he has slight progression in liver metastasis.  Has been trending up lately, also indicating a mild progression. -Limited treatment option at this point. docetaxel, gemcitabine and capecitabine as next line therapy were recommended. -pt is clinically doing well, given the limited treatment options, it was decided to continue current therapy until he has further disease progression. -Guardant360 results were reviewed with him, which is negative for targetable mutations  -restaging CT on 05/30/2023 showed overall stable disease, but probably a tiny new liver metastatic lesion.  His tumor marker has slightly increased lately.  Given the limited treatment options, I will continue his current therapy now and repeat CT in  early Jan 2025

## 2023-08-20 NOTE — Progress Notes (Signed)
 Patient Care Team: Okey Carlin Redbird, MD as PCP - General (Family Medicine) Lanny Callander, MD as Consulting Physician (Oncology) Ginette, Sabino Jr., MD as Consulting Physician (General Surgery) [facility] as Radiologist (Radiology) [facility] as Radiologist (Diagnostic Radiology)  Clinic Day:  08/26/2023  Referring physician: Lanny Callander, MD  ASSESSMENT & PLAN:   Assessment & Plan: Pancreatic cancer metastasized to liver [facility]) metastatic to liver, stage IV, KRAS G12R (+), MMR normal  -incidental finding on lung cancer screening chest CT on 01/19/22. 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  -He began first-line palliative FOLFIRINOX on 03/13/22, but unfortunately did not respond well. CA 19-9 also rose on treatment. -he switched to second line gemcitabine /abraxane  on 06/05/22. He tolerates moderate well and has continued to work full time. CA 19-9 has dropped on treatment. -restaging CT from 08/24/22 showed partial response to treatment, improved primary tumor, and liver/node mets, I discussed with him  -Pt was referred to see Dr. Zani on 10/10/2022 , surgery was discussed but not felt to be a surgical candidate due to his multiple liver metastasis (5) -Restaging CT scan on November 24, 2022 showed moderate disease progression in liver and the pancreas, his tumor marker also increased. His chemotherapy was changed to 5-FU and liposomal irinotecan , started on 12/04/22, he is tolerating well  -Restaging scan from March 06, 2023 showed slightly decreased pancreatic mass, but he has slight progression in liver metastasis.  Tumor marker has been trending up lately, also indicating a mild progression. -Limited treatment option at this point. docetaxel, gemcitabine  and capecitabine as next line therapy were recommended. -pt is clinically doing well, given  the limited treatment options, it was decided to continue current therapy until he has further disease progression. -Guardant360 results were reviewed with him, which is negative for targetable mutations  -restaging CT on 05/30/2023 showed overall stable disease, but probably a tiny new liver metastatic lesion.  His tumor marker has slightly increased lately.  Given the limited treatment options, his current therapy was continued without changes and repeat CT in  early Jan 2025 Today, 08/21/2023, he is receiving cycle 18 day 1 of liposomal irinotecan  and 5-FU. Restaging CT CAP scheduled for 09/17/2023.   Plan: Labs reviewed  -CBC showing WBC 12.2; Hgb 11.2; Hct 33.3; Plt 349; Anc 6.5 -CMP - K 3.7; glucose 139; BUN 9; Creatinine 0.32; eGFR > 60; Ca 8.8; LFTs normal.   The patient condition and labs are satisfactory for treatment today. Proceed with cycle 18 day 1 of liposomal irinotecan  and 5-FU. Labs/flush, follow-up, and treatment as scheduled. Restaging CT CAP scheduled for 09/17/2023.   The patient understands the plans discussed today and is in agreement with them.  He knows to contact our office if he develops concerns prior to his next appointment.  I provided 25 minutes of face-to-face time during this encounter and > 50% was spent counseling as documented under my assessment and plan.    Powell FORBES Lessen, NP  [facility] - A DEPT OF [facility] [address] [place] [address] Dept: [phone] Dept Fax: [phone]   No orders of the defined types were placed in this encounter.     CHIEF COMPLAINT:  CC: Follow-up of metastatic pancreatic cancer  Current Treatment: Third line chemotherapy liposomal irinotecan  and 5-FU  INTERVAL  HISTORY:  Steve Mills is here today for repeat clinical assessment.  Patient was last seen by Dr. Lanny on 07/30/2023.  Today is cycle 18 day 1.  New CT CAP scheduled for 09/17/2023.   Will revisit other options for chemotherapy after results have been reviewed with patient.  His overall tolerating current treatment well. He denies chest pain, chest pressure, or shortness of breath. He denies headaches or visual disturbances. He denies abdominal pain, nausea, vomiting, or changes in bowel or bladder habits.  He denies fevers or chills. He denies pain. His appetite is good. His weight has been stable.  I have reviewed the past medical history, past surgical history, social history and family history with the patient and they are unchanged from previous note.  ALLERGIES:  has no known allergies.  MEDICATIONS:  Current Outpatient Medications  Medication Sig Dispense Refill   apixaban  (ELIQUIS ) 5 MG TABS tablet Take 1 tablet (5 mg total) by mouth 2 (two) times daily. 60 tablet 3   azithromycin  (ZITHROMAX  Z-PAK) 250 MG tablet Take 2 tablets po on day 1 and 1 tablet po on days 2 through 5. 6 each 0   KLOR-CON  M20 20 MEQ tablet TAKE 1 TAB (20 MEQ) 2X DAILY FOR 7 DAYS AND THEN TAKE 1 TAB DAILY FOR REMAINDER OF THE MONTH. 37 tablet 0   lidocaine -prilocaine  (EMLA ) cream Apply to affected area once 30 g 3   lipase/protease/amylase (CREON ) 36000 UNITS CPEP capsule Take 2 capsules (72,000 Units total) by mouth 3 (three) times daily with meals. May also take 1 capsule (36,000 Units total) as needed (with snacks). 240 capsule 3   loperamide (IMODIUM A-D) 2 MG tablet Take 2 mg by mouth 4 (four) times daily as needed for diarrhea or loose stools.     ondansetron  (ZOFRAN ) 8 MG tablet Take 1 tablet (8 mg total) by mouth every 8 (eight) hours as needed for nausea or vomiting. Start on the third day after irinotecan  30 tablet 1   prochlorperazine  (COMPAZINE ) 10 MG tablet Take 1 tablet (10 mg total) by mouth every 6 (six) hours as needed for nausea or vomiting. 30 tablet 1   valACYclovir  (VALTREX ) 500 MG tablet Take 1 tablet (500 mg total) by mouth 2 (two) times daily. 60 tablet 3   No current  facility-administered medications for this visit.    HISTORY OF PRESENT ILLNESS:   Oncology History Overview Note   Cancer Staging  Pancreatic cancer metastasized to liver [facility]) Staging form: Exocrine Pancreas, AJCC 8th Edition - Clinical stage from 02/28/2022: Stage IV (cT2, cN1, pM1) - Signed by Lanny Callander, MD on 03/02/2022 Stage prefix: Initial diagnosis     Pancreatic cancer metastasized to liver (HCC)  02/28/2022 Cancer Staging   Staging form: Exocrine Pancreas, AJCC 8th Edition - Clinical stage from 02/28/2022: Stage IV (cT2, cN1, pM1) - Signed by Lanny Callander, MD on 03/02/2022 Stage prefix: Initial diagnosis   03/02/2022 Initial Diagnosis   Pancreatic cancer metastasized to liver (HCC)   03/13/2022 - 04/13/2022 Chemotherapy   Patient is on Treatment Plan : PANCREAS Modified FOLFIRINOX q14d x 4 cycles     03/13/2022 - 05/17/2022 Chemotherapy   Patient is on Treatment Plan : PANCREAS Modified FOLFIRINOX q14d x 4 cycles     03/18/2022 Genetic Testing   Negative genetic testing on the CancerNext-Expanded+RNAinsight panel.  APC c.[id number] VUs identified.  The report date is March 18, 2022.  The CancerNext-Expanded gene panel offered by [employer] and includes sequencing and rearrangement analysis for the  following 77 genes: AIP, ALK, APC*, ATM*, AXIN2, BAP1, BARD1, BLM, BMPR1A, BRCA1*, BRCA2*, BRIP1*, CDC73, CDH1*, CDK4, CDKN1B, CDKN2A, CHEK2*, CTNNA1, DICER1, FANCC, FH, FLCN, GALNT12, KIF1B, LZTR1, MAX, MEN1, MET, MLH1*, MSH2*, MSH3, MSH6*, MUTYH*, NBN, NF1*, NF2, NTHL1, PALB2*, PHOX2B, PMS2*, POT1, PRKAR1A, PTCH1, PTEN*, RAD51C*, RAD51D*, RB1, RECQL, RET, SDHA, SDHAF2, SDHB, SDHC, SDHD, SMAD4, SMARCA4, SMARCB1, SMARCE1, STK11, SUFU, TMEM127, TP53*, TSC1, TSC2, VHL and XRCC2 (sequencing and deletion/duplication); EGFR, EGLN1, HOXB13, KIT, MITF, PDGFRA, POLD1, and POLE (sequencing only); EPCAM and GREM1 (deletion/duplication only). DNA and RNA analyses performed for * genes.    05/25/2022  Progression   Rising CA 19-9, CT CAP IMPRESSION: 1. Multiple new and enlarged hypodense, rim enhancing liver metastases. 2. Unchanged, ill-defined hypoenhancing mass of the pancreatic uncinate, consistent with pancreatic adenocarcinoma 3. Unchanged small prominent portacaval and gastrohepatic ligament nodes, modestly suspicious for nodal metastases. No overtly enlarged lymph nodes in the abdomen or pelvis. 4. Prostatomegaly.    06/05/2022 - 11/13/2022 Chemotherapy   Patient is on Treatment Plan : PANCREATIC Abraxane  D1,8,15 + Gemcitabine  D1,8,15 q28d     11/24/2022 Imaging   IMPRESSION: 1. Today's study demonstrates mild progression of disease as evidenced by slight enlargement of the mass in the uncinate process of the pancreas which demonstrates potential direct invasion into the superior wall of the third portion of the duodenum, but currently demonstrates no definite vascular involvement. Multiple hepatic lesions are generally stable to the prior study, with exception of enlargement of a lesion in segment 8, as detailed above. 2. No new sites of metastatic disease are noted elsewhere in the chest or pelvis. 3. Aortic atherosclerosis.  Rising CA 19-9    12/04/2022 -  Chemotherapy   Patient is on Treatment Plan : PANCREAS Liposomal Irinotecan  + Leucovorin  + 5-FU IVCI q14d     03/06/2023 Imaging    IMPRESSION: 1. Mass in the pancreatic head demonstrates decreased size since prior study. 2. Multiple liver metastasis are mildly increased in size. No new lesions. 3. 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 Metastatic disease would be less likely. 4. Mild aortic atherosclerosis. 5. Nonspecific sclerotic foci in the pelvis, unchanged, likely benign bone islands.         REVIEW OF SYSTEMS:   Constitutional: Denies fevers, chills or abnormal weight  loss Eyes: Denies blurriness of vision Ears, nose, mouth, throat, and face: Denies mucositis or sore throat Respiratory: Denies cough, dyspnea or wheezes Cardiovascular: Denies palpitation, chest discomfort or lower extremity swelling Gastrointestinal:  Denies nausea, heartburn or change in bowel habits Skin: Denies abnormal skin rashes Lymphatics: Denies new lymphadenopathy or easy bruising Neurological:Denies numbness, tingling or new weaknesses Behavioral/Psych: Mood is stable, no new changes  All other systems were reviewed with the patient and are negative.   VITALS:   Today's Vitals   08/21/23 1115 08/21/23 1120  BP: (!) 126/90 94/74  Pulse: 73   Resp: 14   Temp: (!) 97.2 F (36.2 C)   TempSrc: Temporal   SpO2: 96%   Weight: 234 lb 14.4 oz (106.5 kg)   PainSc:  0-No pain   Body mass index is 31.86 kg/m.    Wt Readings from Last 3 Encounters:  08/21/23 234 lb 14.4 oz (106.5 kg)  07/30/23 234 lb 1.6 oz (106.2 kg)  07/16/23 235 lb 4 oz (106.7 kg)    Body mass index is 31.86 kg/m.  Performance status (ECOG): 1 -  Symptomatic but completely ambulatory  PHYSICAL EXAM:   GENERAL:alert, no distress and comfortable SKIN: skin color, texture, turgor are normal, no rashes or significant lesions EYES: normal, Conjunctiva are pink and non-injected, sclera clear OROPHARYNX:no exudate, no erythema and lips, buccal mucosa, and tongue normal  NECK: supple, thyroid normal size, non-tender, without nodularity LYMPH:  no palpable lymphadenopathy in the cervical, axillary or inguinal LUNGS: clear to auscultation and percussion with normal breathing effort HEART: regular rate & rhythm and no murmurs and no lower extremity edema ABDOMEN:abdomen soft, non-tender and normal bowel sounds Musculoskeletal:no cyanosis of digits and no clubbing  NEURO: alert & oriented x 3 with fluent speech, no focal motor/sensory deficits  LABORATORY DATA:  I have reviewed the data as listed     Component Value Date/Time   NA 138 08/21/2023 1035   K 3.7 08/21/2023 1035   CL 105 08/21/2023 1035   CO2 25 08/21/2023 1035   GLUCOSE 139 (H) 08/21/2023 1035   BUN 9 08/21/2023 1035   CREATININE 0.82 08/21/2023 1035   CALCIUM  8.8 (L) 08/21/2023 1035   PROT 6.3 (L) 08/21/2023 1035   ALBUMIN 3.6 08/21/2023 1035   AST 17 08/21/2023 1035   ALT 14 08/21/2023 1035   ALKPHOS 92 08/21/2023 1035   BILITOT 0.4 08/21/2023 1035   GFRNONAA >60 08/21/2023 1035   GFRAA >60 07/12/2018 0448   Lab Results  Component Value Date   WBC 12.2 (H) 08/21/2023   NEUTROABS 6.5 08/21/2023   HGB 11.2 (L) 08/21/2023   HCT 33.3 (L) 08/21/2023   MCV 90.2 08/21/2023   PLT 349 08/21/2023

## 2023-08-21 ENCOUNTER — Inpatient Hospital Stay: Payer: BC Managed Care – PPO

## 2023-08-21 ENCOUNTER — Inpatient Hospital Stay: Payer: BC Managed Care – PPO | Admitting: Nurse Practitioner

## 2023-08-21 VITALS — BP 94/74 | HR 73 | Temp 97.2°F | Resp 14 | Wt 234.9 lb

## 2023-08-21 DIAGNOSIS — Z5111 Encounter for antineoplastic chemotherapy: Secondary | ICD-10-CM | POA: Diagnosis not present

## 2023-08-21 DIAGNOSIS — R112 Nausea with vomiting, unspecified: Secondary | ICD-10-CM | POA: Diagnosis not present

## 2023-08-21 DIAGNOSIS — Z452 Encounter for adjustment and management of vascular access device: Secondary | ICD-10-CM | POA: Diagnosis not present

## 2023-08-21 DIAGNOSIS — C259 Malignant neoplasm of pancreas, unspecified: Secondary | ICD-10-CM

## 2023-08-21 DIAGNOSIS — C787 Secondary malignant neoplasm of liver and intrahepatic bile duct: Secondary | ICD-10-CM

## 2023-08-21 DIAGNOSIS — Z95828 Presence of other vascular implants and grafts: Secondary | ICD-10-CM

## 2023-08-21 DIAGNOSIS — C250 Malignant neoplasm of head of pancreas: Secondary | ICD-10-CM | POA: Diagnosis not present

## 2023-08-21 DIAGNOSIS — Z7901 Long term (current) use of anticoagulants: Secondary | ICD-10-CM | POA: Diagnosis not present

## 2023-08-21 DIAGNOSIS — I700 Atherosclerosis of aorta: Secondary | ICD-10-CM | POA: Diagnosis not present

## 2023-08-21 DIAGNOSIS — J329 Chronic sinusitis, unspecified: Secondary | ICD-10-CM | POA: Diagnosis not present

## 2023-08-21 LAB — CBC WITH DIFFERENTIAL (CANCER CENTER ONLY)
Abs Immature Granulocytes: 0.14 10*3/uL — ABNORMAL HIGH (ref 0.00–0.07)
Basophils Absolute: 0.1 10*3/uL (ref 0.0–0.1)
Basophils Relative: 1 %
Eosinophils Absolute: 1.6 10*3/uL — ABNORMAL HIGH (ref 0.0–0.5)
Eosinophils Relative: 13 %
HCT: 33.3 % — ABNORMAL LOW (ref 39.0–52.0)
Hemoglobin: 11.2 g/dL — ABNORMAL LOW (ref 13.0–17.0)
Immature Granulocytes: 1 %
Lymphocytes Relative: 22 %
Lymphs Abs: 2.7 10*3/uL (ref 0.7–4.0)
MCH: 30.4 pg (ref 26.0–34.0)
MCHC: 33.6 g/dL (ref 30.0–36.0)
MCV: 90.2 fL (ref 80.0–100.0)
Monocytes Absolute: 1.1 10*3/uL — ABNORMAL HIGH (ref 0.1–1.0)
Monocytes Relative: 9 %
Neutro Abs: 6.5 10*3/uL (ref 1.7–7.7)
Neutrophils Relative %: 54 %
Platelet Count: 349 10*3/uL (ref 150–400)
RBC: 3.69 MIL/uL — ABNORMAL LOW (ref 4.22–5.81)
RDW: 14.8 % (ref 11.5–15.5)
WBC Count: 12.2 10*3/uL — ABNORMAL HIGH (ref 4.0–10.5)
nRBC: 0 % (ref 0.0–0.2)

## 2023-08-21 LAB — CMP (CANCER CENTER ONLY)
ALT: 14 U/L (ref 0–44)
AST: 17 U/L (ref 15–41)
Albumin: 3.6 g/dL (ref 3.5–5.0)
Alkaline Phosphatase: 92 U/L (ref 38–126)
Anion gap: 8 (ref 5–15)
BUN: 9 mg/dL (ref 6–20)
CO2: 25 mmol/L (ref 22–32)
Calcium: 8.8 mg/dL — ABNORMAL LOW (ref 8.9–10.3)
Chloride: 105 mmol/L (ref 98–111)
Creatinine: 0.82 mg/dL (ref 0.61–1.24)
GFR, Estimated: >60 mL/min (ref >60)
Glucose, Bld: 139 mg/dL — ABNORMAL HIGH (ref 70–99)
Potassium: 3.7 mmol/L (ref 3.5–5.1)
Sodium: 138 mmol/L (ref 135–145)
Total Bilirubin: 0.4 mg/dL (ref 0.0–1.2)
Total Protein: 6.3 g/dL — ABNORMAL LOW (ref 6.5–8.1)

## 2023-08-21 MED ORDER — SODIUM CHLORIDE 0.9% FLUSH
10.0000 mL | Freq: Once | INTRAVENOUS | Status: AC
  Administered 2023-08-21: 10 mL

## 2023-08-21 MED ORDER — PALONOSETRON HCL INJECTION 0.25 MG/5ML
0.2500 mg | Freq: Once | INTRAVENOUS | Status: AC
  Administered 2023-08-21: 0.25 mg via INTRAVENOUS

## 2023-08-21 MED ORDER — SODIUM CHLORIDE 0.9 % IV SOLN: Freq: Once | INTRAVENOUS | Status: AC

## 2023-08-21 MED ORDER — IRINOTECAN HCL LIPOSOME CHEMO INJECTION 43 MG/10ML
60.0000 mg/m2 | INJECTION | Freq: Once | INTRAVENOUS | Status: AC
  Administered 2023-08-21: 146.2 mg via INTRAVENOUS
  Filled 2023-08-21: qty 34

## 2023-08-21 MED ORDER — SODIUM CHLORIDE 0.9 % IV SOLN
400.0000 mg/m2 | Freq: Once | INTRAVENOUS | Status: AC
  Administered 2023-08-21: 980 mg via INTRAVENOUS
  Filled 2023-08-21: qty 49

## 2023-08-21 MED ORDER — FLUOROURACIL CHEMO INJECTION 5 GM/100ML
2400.0000 mg/m2 | INTRAVENOUS | Status: DC
  Administered 2023-08-21: 5900 mg via INTRAVENOUS
  Filled 2023-08-21: qty 118

## 2023-08-21 MED ORDER — AZITHROMYCIN 250 MG PO TABS: ORAL_TABLET | ORAL | 0 refills | Status: DC

## 2023-08-21 MED ORDER — DEXAMETHASONE SODIUM PHOSPHATE 10 MG/ML IJ SOLN
10.0000 mg | Freq: Once | INTRAMUSCULAR | Status: AC
  Administered 2023-08-21: 10 mg via INTRAVENOUS

## 2023-08-23 ENCOUNTER — Inpatient Hospital Stay: Payer: BC Managed Care – PPO | Attending: Hematology

## 2023-08-23 VITALS — BP 122/78 | HR 70 | Temp 98.9°F | Resp 18

## 2023-08-23 DIAGNOSIS — C250 Malignant neoplasm of head of pancreas: Secondary | ICD-10-CM | POA: Insufficient documentation

## 2023-08-23 DIAGNOSIS — E876 Hypokalemia: Secondary | ICD-10-CM | POA: Diagnosis not present

## 2023-08-23 DIAGNOSIS — Z452 Encounter for adjustment and management of vascular access device: Secondary | ICD-10-CM | POA: Insufficient documentation

## 2023-08-23 DIAGNOSIS — C787 Secondary malignant neoplasm of liver and intrahepatic bile duct: Secondary | ICD-10-CM | POA: Diagnosis not present

## 2023-08-23 DIAGNOSIS — C259 Malignant neoplasm of pancreas, unspecified: Secondary | ICD-10-CM

## 2023-08-23 DIAGNOSIS — Z5111 Encounter for antineoplastic chemotherapy: Secondary | ICD-10-CM | POA: Diagnosis not present

## 2023-08-23 MED ORDER — HEPARIN SOD (PORK) LOCK FLUSH 100 UNIT/ML IV SOLN
500.0000 [IU] | Freq: Once | INTRAVENOUS | Status: AC | PRN
  Administered 2023-08-23: 500 [IU]

## 2023-08-23 MED ORDER — SODIUM CHLORIDE 0.9% FLUSH
10.0000 mL | INTRAVENOUS | Status: DC | PRN
  Administered 2023-08-23: 10 mL

## 2023-08-26 ENCOUNTER — Encounter: Payer: Self-pay | Admitting: Physician Assistant

## 2023-08-26 ENCOUNTER — Encounter: Payer: Self-pay | Admitting: Hematology

## 2023-08-26 ENCOUNTER — Encounter: Payer: Self-pay | Admitting: Nurse Practitioner

## 2023-09-02 NOTE — Assessment & Plan Note (Signed)
 metastatic to liver, stage IV, KRAS G12R (+), MMR normal  -incidental finding on lung cancer screening chest CT on 01/19/22. 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  -He began first-line palliative FOLFIRINOX on 03/13/22, but unfortunately did not respond well. CA 19-9 also rose on treatment. -he switched to second line gemcitabine/abraxane on 06/05/22. He tolerates moderate well and has continued to work full time. CA 19-9 has dropped on treatment. -restaging CT from 08/24/22 showed partial response to treatment, improved primary tumor, and liver/node mets, I discussed with him  -Pt was referred to see Dr. [name] on 10/10/2022 , surgery was discussed but not felt to be a surgical candidate due to his multiple liver metastasis (5) -Restaging CT scan on November 24, 2022 showed moderate disease progression in liver and the pancreas, his tumor marker also increased.  I have changed his chemotherapy to 5-FU and liposomal irinotecan, started on 12/04/22, he is tolerating well  -Restaging scan from March 06, 2023 showed slightly decreased pancreatic mass, but he has slight progression in liver metastasis.  Has been trending up lately, also indicating a mild progression. -We discussed there is very limited treatment option at this point, I would recommend him to try docetaxel, gemcitabine and capecitabine as next line therapy. -pt is clinically doing well, given the limited treatment options, we decided to continue current therapy until he has further disease progression. -I reviewed his Guardant360 results, which is negative for targetable mutations  -restaging CT on 05/30/2023 showed overall stable disease, but probably a tiny new liver metastatic lesion.  His tumor marker has slightly increased lately.  Given the limited treatment options, I will continue his current therapy now and repeat CT in  early Jan  2025

## 2023-09-04 ENCOUNTER — Inpatient Hospital Stay: Payer: BC Managed Care – PPO

## 2023-09-04 ENCOUNTER — Inpatient Hospital Stay: Payer: BC Managed Care – PPO | Admitting: Hematology

## 2023-09-04 VITALS — BP 114/81 | HR 85 | Temp 97.6°F | Resp 18 | Wt 235.2 lb

## 2023-09-04 DIAGNOSIS — C787 Secondary malignant neoplasm of liver and intrahepatic bile duct: Secondary | ICD-10-CM

## 2023-09-04 DIAGNOSIS — Z452 Encounter for adjustment and management of vascular access device: Secondary | ICD-10-CM | POA: Diagnosis not present

## 2023-09-04 DIAGNOSIS — Z95828 Presence of other vascular implants and grafts: Secondary | ICD-10-CM

## 2023-09-04 DIAGNOSIS — Z5111 Encounter for antineoplastic chemotherapy: Secondary | ICD-10-CM | POA: Diagnosis not present

## 2023-09-04 DIAGNOSIS — C259 Malignant neoplasm of pancreas, unspecified: Secondary | ICD-10-CM

## 2023-09-04 DIAGNOSIS — C250 Malignant neoplasm of head of pancreas: Secondary | ICD-10-CM | POA: Diagnosis not present

## 2023-09-04 DIAGNOSIS — E876 Hypokalemia: Secondary | ICD-10-CM | POA: Diagnosis not present

## 2023-09-04 LAB — CBC WITH DIFFERENTIAL (CANCER CENTER ONLY)
Abs Immature Granulocytes: 0.03 10*3/uL (ref 0.00–0.07)
Basophils Absolute: 0.1 10*3/uL (ref 0.0–0.1)
Basophils Relative: 1 %
Eosinophils Absolute: 1.7 10*3/uL — ABNORMAL HIGH (ref 0.0–0.5)
Eosinophils Relative: 21 %
HCT: 31 % — ABNORMAL LOW (ref 39.0–52.0)
Hemoglobin: 10.5 g/dL — ABNORMAL LOW (ref 13.0–17.0)
Immature Granulocytes: 0 %
Lymphocytes Relative: 25 %
Lymphs Abs: 2 10*3/uL (ref 0.7–4.0)
MCH: 30.2 pg (ref 26.0–34.0)
MCHC: 33.9 g/dL (ref 30.0–36.0)
MCV: 89.1 fL (ref 80.0–100.0)
Monocytes Absolute: 0.5 10*3/uL (ref 0.1–1.0)
Monocytes Relative: 6 %
Neutro Abs: 3.8 10*3/uL (ref 1.7–7.7)
Neutrophils Relative %: 47 %
Platelet Count: 279 10*3/uL (ref 150–400)
RBC: 3.48 MIL/uL — ABNORMAL LOW (ref 4.22–5.81)
RDW: 15 % (ref 11.5–15.5)
WBC Count: 8 10*3/uL (ref 4.0–10.5)
nRBC: 0 % (ref 0.0–0.2)

## 2023-09-04 LAB — CMP (CANCER CENTER ONLY)
ALT: 17 U/L (ref 0–44)
AST: 15 U/L (ref 15–41)
Albumin: 3.5 g/dL (ref 3.5–5.0)
Alkaline Phosphatase: 85 U/L (ref 38–126)
Anion gap: 7 (ref 5–15)
BUN: 9 mg/dL (ref 6–20)
CO2: 26 mmol/L (ref 22–32)
Calcium: 8.8 mg/dL — ABNORMAL LOW (ref 8.9–10.3)
Chloride: 103 mmol/L (ref 98–111)
Creatinine: 0.75 mg/dL (ref 0.61–1.24)
GFR, Estimated: >60 mL/min (ref >60)
Glucose, Bld: 164 mg/dL — ABNORMAL HIGH (ref 70–99)
Potassium: 3.2 mmol/L — ABNORMAL LOW (ref 3.5–5.1)
Sodium: 136 mmol/L (ref 135–145)
Total Bilirubin: 0.5 mg/dL (ref 0.0–1.2)
Total Protein: 6.1 g/dL — ABNORMAL LOW (ref 6.5–8.1)

## 2023-09-04 MED ORDER — SODIUM CHLORIDE 0.9 % IV SOLN
400.0000 mg/m2 | Freq: Once | INTRAVENOUS | Status: AC
  Administered 2023-09-04: 980 mg via INTRAVENOUS
  Filled 2023-09-04: qty 49

## 2023-09-04 MED ORDER — FLUOROURACIL CHEMO INJECTION 5 GM/100ML
2400.0000 mg/m2 | INTRAVENOUS | Status: DC
  Administered 2023-09-04: 5900 mg via INTRAVENOUS
  Filled 2023-09-04: qty 118

## 2023-09-04 MED ORDER — SODIUM CHLORIDE 0.9 % IV SOLN
60.0000 mg/m2 | Freq: Once | INTRAVENOUS | Status: AC
  Administered 2023-09-04: 146.2 mg via INTRAVENOUS
  Filled 2023-09-04: qty 34

## 2023-09-04 MED ORDER — SODIUM CHLORIDE 0.9% FLUSH
10.0000 mL | Freq: Once | INTRAVENOUS | Status: AC
  Administered 2023-09-04: 10 mL

## 2023-09-04 MED ORDER — PALONOSETRON HCL INJECTION 0.25 MG/5ML
0.2500 mg | Freq: Once | INTRAVENOUS | Status: AC
  Administered 2023-09-04: 0.25 mg via INTRAVENOUS
  Filled 2023-09-04: qty 5

## 2023-09-04 MED ORDER — SODIUM CHLORIDE 0.9 % IV SOLN: Freq: Once | INTRAVENOUS | Status: AC

## 2023-09-04 MED ORDER — DEXAMETHASONE SODIUM PHOSPHATE 10 MG/ML IJ SOLN
10.0000 mg | Freq: Once | INTRAMUSCULAR | Status: AC
  Administered 2023-09-04: 10 mg via INTRAVENOUS
  Filled 2023-09-04: qty 1

## 2023-09-04 MED ORDER — HEPARIN SOD (PORK) LOCK FLUSH 100 UNIT/ML IV SOLN: 500.0000 [IU] | Freq: Once | INTRAVENOUS | Status: DC

## 2023-09-04 NOTE — Progress Notes (Signed)
 [facility]   [phone] [phone]   Clinic Follow up Note   Patient Care Team: Okey Carlin Redbird, MD as PCP - General (Family Medicine) Lanny Callander, MD as Consulting Physician (Oncology) Ginette, Olympia Raddle., MD as Consulting Physician (General Surgery) [facility] as Radiologist (Radiology) [facility] as Radiologist (Diagnostic Radiology)  Date of Service:  09/04/2023  CHIEF COMPLAINT: f/u of pancreatic cancer  CURRENT THERAPY:  FOLFIRI with liposomal irinotecan  every 2 weeks  Oncology History   Pancreatic cancer metastasized to liver [facility]) metastatic to liver, stage IV, KRAS G12R (+), MMR normal  -incidental finding on lung cancer screening chest CT on 01/19/22. 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  -He began first-line palliative FOLFIRINOX on 03/13/22, but unfortunately did not respond well. CA 19-9 also rose on treatment. -he switched to second line gemcitabine /abraxane  on 06/05/22. He tolerates moderate well and has continued to work full time. CA 19-9 has dropped on treatment. -restaging CT from 08/24/22 showed partial response to treatment, improved primary tumor, and liver/node mets, I discussed with him  -Pt was referred to see Dr. Zani on 10/10/2022 , surgery was discussed but not felt to be a surgical candidate due to his multiple liver metastasis (5) -Restaging CT scan on November 24, 2022 showed moderate disease progression in liver and the pancreas, his tumor marker also increased.  I have changed his chemotherapy to 5-FU and liposomal irinotecan , started on 12/04/22, he is tolerating well  -Restaging scan from March 06, 2023 showed slightly decreased pancreatic mass, but he has slight progression in liver metastasis.  Has been trending up lately, also indicating a mild progression. -We  discussed there is very limited treatment option at this point, I would recommend him to try docetaxel, gemcitabine  and capecitabine as next line therapy. -pt is clinically doing well, given the limited treatment options, we decided to continue current therapy until he has further disease progression. -I reviewed his Guardant360 results, which is negative for targetable mutations  -restaging CT on 05/30/2023 showed overall stable disease, but probably a tiny new liver metastatic lesion.  His tumor marker has slightly increased lately.  Given the limited treatment options, I will continue his current therapy now and repeat CT in  early Jan 2025    Assessment and Plan    Pancreatic Cancer Follow-up for pancreatic cancer. On current chemotherapy regimen since April last year. Reports less diarrhea with Imodium as needed. Neuropathy unchanged. Hemoglobin at 10.5. Kidney and liver functions are good. Potassium at 3.2. Tumor marker results pending; last result was 107, showing a slow increase over four months. Next CT scan on September 17, 2023, and next treatment on September 18, 2023. Discussed importance of CT scan in guiding treatment decisions and potential need to change therapy if tumor size increases by more than 20%. - Increase potassium supplementation to twice daily - Schedule CT scan for September 17, 2023 - Administer chemotherapy on September 18, 2023 - Schedule two more cycles of chemotherapy - Review CT scan results before the next treatment  Hypokalemia Potassium level at 3.2. Increase potassium supplementation to twice daily as previously instructed. - Increase potassium supplementation to twice daily  Follow-up - Follow-up visit on September 18, 2023.        SUMMARY OF ONCOLOGIC HISTORY: Oncology History Overview Note   Cancer Staging  Pancreatic cancer metastasized  to liver [facility]) Staging form: Exocrine Pancreas, AJCC 8th Edition - Clinical stage from 02/28/2022: Stage IV (cT2, cN1,  pM1) - Signed by Lanny Callander, MD on 03/02/2022 Stage prefix: Initial diagnosis     Pancreatic cancer metastasized to liver [facility])  02/28/2022 Cancer Staging   Staging form: Exocrine Pancreas, AJCC 8th Edition - Clinical stage from 02/28/2022: Stage IV (cT2, cN1, pM1) - Signed by Lanny Callander, MD on 03/02/2022 Stage prefix: Initial diagnosis   03/02/2022 Initial Diagnosis   Pancreatic cancer metastasized to liver (HCC)   03/13/2022 - 04/13/2022 Chemotherapy   Patient is on Treatment Plan : PANCREAS Modified FOLFIRINOX q14d x 4 cycles     03/13/2022 - 05/17/2022 Chemotherapy   Patient is on Treatment Plan : PANCREAS Modified FOLFIRINOX q14d x 4 cycles     03/18/2022 Genetic Testing   Negative genetic testing on the CancerNext-Expanded+RNAinsight panel.  APC [id number] VUs identified.  The report date is March 18, 2022.  The CancerNext-Expanded gene panel offered by [facility] and includes sequencing and rearrangement analysis for the following 77 genes: AIP, ALK, APC*, ATM*, AXIN2, BAP1, BARD1, BLM, BMPR1A, BRCA1*, BRCA2*, BRIP1*, CDC73, CDH1*, CDK4, CDKN1B, CDKN2A, CHEK2*, CTNNA1, DICER1, FANCC, FH, FLCN, GALNT12, KIF1B, LZTR1, MAX, MEN1, MET, MLH1*, MSH2*, MSH3, MSH6*, MUTYH*, NBN, NF1*, NF2, NTHL1, PALB2*, PHOX2B, PMS2*, POT1, PRKAR1A, PTCH1, PTEN*, RAD51C*, RAD51D*, RB1, RECQL, RET, SDHA, SDHAF2, SDHB, SDHC, SDHD, SMAD4, SMARCA4, SMARCB1, SMARCE1, STK11, SUFU, TMEM127, TP53*, TSC1, TSC2, VHL and XRCC2 (sequencing and deletion/duplication); EGFR, EGLN1, HOXB13, KIT, MITF, PDGFRA, POLD1, and POLE (sequencing only); EPCAM and GREM1 (deletion/duplication only). DNA and RNA analyses performed for * genes.    05/25/2022 Progression   Rising CA 19-9, CT CAP IMPRESSION: 1. Multiple new and enlarged hypodense, rim enhancing liver metastases. 2. Unchanged, ill-defined hypoenhancing mass of the pancreatic uncinate, consistent with pancreatic adenocarcinoma 3. Unchanged small prominent portacaval and  gastrohepatic ligament nodes, modestly suspicious for nodal metastases. No overtly enlarged lymph nodes in the abdomen or pelvis. 4. Prostatomegaly.    06/05/2022 - 11/13/2022 Chemotherapy   Patient is on Treatment Plan : PANCREATIC Abraxane  D1,8,15 + Gemcitabine  D1,8,15 q28d     11/24/2022 Imaging   IMPRESSION: 1. Today's study demonstrates mild progression of disease as evidenced by slight enlargement of the mass in the uncinate process of the pancreas which demonstrates potential direct invasion into the superior wall of the third portion of the duodenum, but currently demonstrates no definite vascular involvement. Multiple hepatic lesions are generally stable to the prior study, with exception of enlargement of a lesion in segment 8, as detailed above. 2. No new sites of metastatic disease are noted elsewhere in the chest or pelvis. 3. Aortic atherosclerosis.  Rising CA 19-9    12/04/2022 -  Chemotherapy   Patient is on Treatment Plan : PANCREAS Liposomal Irinotecan  + Leucovorin  + 5-FU IVCI q14d     03/06/2023 Imaging    IMPRESSION: 1. Mass in the pancreatic head demonstrates decreased size since prior study. 2. Multiple liver metastasis are mildly increased in size. No new lesions. 3. 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 Metastatic disease would be less likely. 4. Mild aortic atherosclerosis. 5. Nonspecific sclerotic foci in the pelvis, unchanged, likely benign bone islands.        Discussed the use of AI scribe software for clinical note transcription with the patient, who gave verbal consent to proceed.  History of Present  Illness   The patient, a 58 year old with pancreatic cancer, presents for a routine follow-up. He reports no significant issues from recent chemotherapy treatments, with less diarrhea than usual. He uses Imodium as  needed, but has not required much recently. He reports no change in his neuropathy symptoms. He has been taking potassium supplements once daily, but his potassium levels remain slightly low. His tumor marker results are not yet available, but he has been slowly increasing over the past four months.         All other systems were reviewed with the patient and are negative.  MEDICAL HISTORY:  Past Medical History:  Diagnosis Date   Family history of adverse reaction to anesthesia    mother allergic to ether    Family history of breast cancer    Family history of colon cancer    Pneumonia    hx of 2014     SURGICAL HISTORY: Past Surgical History:  Procedure Laterality Date   APPENDECTOMY     CHOLECYSTECTOMY N/A 07/12/2018   Procedure: LAPAROSCOPIC CHOLECYSTECTOMY WITH INTRAOPERATIVE CHOLANGIOGRAM;  Surgeon: Gladis Cough, MD;  Location: [facility] ORS;  Service: General;  Laterality: N/A;   left wrist surgery      has plate in arm    NASAL POLYP SURGERY  2017   PORTACATH PLACEMENT N/A 03/10/2022   Procedure: INSERTION PORT-A-CATH;  Surgeon: Dasie Leonor CROME, MD;  Location: [facility] OR;  Service: General;  Laterality: N/A;   VENTRAL HERNIA REPAIR N/A 07/15/2014   Procedure: LAPAROSCOPIC REPAIR INCARCARTED UMBILICAL  VENTRAL HERNIA, ;  Surgeon: Elon Pacini, MD;  Location: [facility] ORS;  Service: General;  Laterality: N/A;  With MESH    I have reviewed the social history and family history with the patient and they are unchanged from previous note.  ALLERGIES:  has no known allergies.  MEDICATIONS:  Current Outpatient Medications  Medication Sig Dispense Refill   apixaban  (ELIQUIS ) 5 MG TABS tablet Take 1 tablet (5 mg total) by mouth 2 (two) times daily. 60 tablet 3   azithromycin  (ZITHROMAX  Z-PAK) 250 MG tablet Take 2 tablets po on day 1 and 1 tablet po on days 2 through 5. 6 each 0   KLOR-CON  M20 20 MEQ tablet TAKE 1 TAB (20 MEQ) 2X DAILY FOR 7 DAYS AND THEN TAKE 1 TAB DAILY FOR REMAINDER OF  THE MONTH. 37 tablet 0   lidocaine -prilocaine  (EMLA ) cream Apply to affected area once 30 g 3   lipase/protease/amylase (CREON ) 36000 UNITS CPEP capsule Take 2 capsules (72,000 Units total) by mouth 3 (three) times daily with meals. May also take 1 capsule (36,000 Units total) as needed (with snacks). 240 capsule 3   loperamide (IMODIUM A-D) 2 MG tablet Take 2 mg by mouth 4 (four) times daily as needed for diarrhea or loose stools.     ondansetron  (ZOFRAN ) 8 MG tablet Take 1 tablet (8 mg total) by mouth every 8 (eight) hours as needed for nausea or vomiting. Start on the third day after irinotecan  30 tablet 1   prochlorperazine  (COMPAZINE ) 10 MG tablet Take 1 tablet (10 mg total) by mouth every 6 (six) hours as needed for nausea or vomiting. 30 tablet 1   valACYclovir  (VALTREX ) 500 MG tablet Take 1 tablet (500 mg total) by mouth 2 (two) times daily. 60 tablet 3   No current facility-administered medications for this visit.   Facility-Administered Medications Ordered in Other Visits  Medication Dose Route Frequency Provider Last Rate Last Admin   fluorouracil  (ADRUCIL ) 5,900  mg in sodium chloride  0.9 % 132 mL chemo infusion  2,400 mg/m2 (Treatment Plan Recorded) Intravenous 1 day or 1 dose Lanny Callander, MD   Infusion Verify at 09/04/23 1550    PHYSICAL EXAMINATION: ECOG PERFORMANCE STATUS: 1 - Symptomatic but completely ambulatory  Vitals:   09/04/23 1111  BP: 114/81  Pulse: 85  Resp: 18  Temp: 97.6 F (36.4 C)  SpO2: 99%   Wt Readings from Last 3 Encounters:  09/04/23 235 lb 3.2 oz (106.7 kg)  08/21/23 234 lb 14.4 oz (106.5 kg)  07/30/23 234 lb 1.6 oz (106.2 kg)     GENERAL:alert, no distress and comfortable SKIN: skin color, texture, turgor are normal, no rashes or significant lesions EYES: normal, Conjunctiva are pink and non-injected, sclera clear NECK: supple, thyroid normal size, non-tender, without nodularity LYMPH:  no palpable lymphadenopathy in the cervical, axillary   LUNGS: clear to auscultation and percussion with normal breathing effort HEART: regular rate & rhythm and no murmurs and no lower extremity edema ABDOMEN:abdomen soft, non-tender and normal bowel sounds Musculoskeletal:no cyanosis of digits and no clubbing  NEURO: alert & oriented x 3 with fluent speech, no focal motor/sensory deficits    LABORATORY DATA:  I have reviewed the data as listed    Latest Ref Rng & Units 09/04/2023   10:34 AM 08/21/2023   10:35 AM 07/30/2023   10:39 AM  CBC  WBC 4.0 - 10.5 K/uL 8.0  12.2  5.7   Hemoglobin 13.0 - 17.0 g/dL 89.4  88.7  88.9   Hematocrit 39.0 - 52.0 % 31.0  33.3  32.3   Platelets 150 - 400 K/uL 279  349  292         Latest Ref Rng & Units 09/04/2023   10:34 AM 08/21/2023   10:35 AM 07/30/2023   10:39 AM  CMP  Glucose 70 - 99 mg/dL 835  860  866   BUN 6 - 20 mg/dL 9  9  8    Creatinine 0.61 - 1.24 mg/dL 9.24  9.17  9.05   Sodium 135 - 145 mmol/L 136  138  137   Potassium 3.5 - 5.1 mmol/L 3.2  3.7  3.4   Chloride 98 - 111 mmol/L 103  105  105   CO2 22 - 32 mmol/L 26  25  25    Calcium  8.9 - 10.3 mg/dL 8.8  8.8  8.8   Total Protein 6.5 - 8.1 g/dL 6.1  6.3  6.1   Total Bilirubin 0.0 - 1.2 mg/dL 0.5  0.4  0.4   Alkaline Phos 38 - 126 U/L 85  92  90   AST 15 - 41 U/L 15  17  17    ALT 0 - 44 U/L 17  14  17        RADIOGRAPHIC STUDIES: I have personally reviewed the radiological images as listed and agreed with the findings in the report. No results found.    Orders Placed This Encounter  Procedures   CBC with Differential (Cancer Center Only)    Standing Status:   Future    Expected Date:   09/17/2023    Expiration Date:   09/16/2024   CMP (Cancer Center only)    Standing Status:   Future    Expected Date:   09/17/2023    Expiration Date:   09/16/2024   CBC with Differential (Cancer Center Only)    Standing Status:   Future    Expected Date:   10/01/2023  Expiration Date:   09/30/2024   CMP (Cancer Center only)    Standing  Status:   Future    Expected Date:   10/01/2023    Expiration Date:   09/30/2024   All questions were answered. The patient knows to call the clinic with any problems, questions or concerns. No barriers to learning was detected. The total time spent in the appointment was 25 minutes.     Onita Mattock, MD 09/04/2023

## 2023-09-05 ENCOUNTER — Other Ambulatory Visit: Payer: Self-pay

## 2023-09-05 DIAGNOSIS — C259 Malignant neoplasm of pancreas, unspecified: Secondary | ICD-10-CM | POA: Diagnosis not present

## 2023-09-05 DIAGNOSIS — C787 Secondary malignant neoplasm of liver and intrahepatic bile duct: Secondary | ICD-10-CM | POA: Diagnosis not present

## 2023-09-05 LAB — CANCER ANTIGEN 19-9: CA 19-9: 215 U/mL — ABNORMAL HIGH (ref 0–35)

## 2023-09-06 ENCOUNTER — Other Ambulatory Visit: Payer: Self-pay

## 2023-09-06 ENCOUNTER — Inpatient Hospital Stay: Payer: BC Managed Care – PPO

## 2023-09-11 ENCOUNTER — Encounter: Payer: Self-pay | Admitting: Hematology

## 2023-09-17 ENCOUNTER — Ambulatory Visit
Admission: RE | Admit: 2023-09-17 | Discharge: 2023-09-17 | Disposition: A | Discharge status: Home / Self Care | Payer: BC Managed Care – PPO | Source: Ambulatory Visit | Attending: Hematology

## 2023-09-17 ENCOUNTER — Telehealth: Payer: Self-pay

## 2023-09-17 DIAGNOSIS — C259 Malignant neoplasm of pancreas, unspecified: Secondary | ICD-10-CM | POA: Diagnosis not present

## 2023-09-17 DIAGNOSIS — C787 Secondary malignant neoplasm of liver and intrahepatic bile duct: Secondary | ICD-10-CM | POA: Diagnosis not present

## 2023-09-17 MED ORDER — IOPAMIDOL (ISOVUE-370) INJECTION 76%
500.0000 mL | Freq: Once | INTRAVENOUS | Status: AC | PRN
  Administered 2023-09-17: 80 mL via INTRAVENOUS

## 2023-09-17 NOTE — Telephone Encounter (Signed)
Pt LVM requesting to cancel appts scheduled on 09/18/2023 d/t diarrhea and n/v.  Returned pt's call regarding voicemail.  Pt stated he's had diarrhea x8 days and taking Imodium AD which seems to be helping with the diarrhea.  Pt stated he had n/v x4 days which stopped last Thursday 09/13/2023.  Pt stated he's drinking Liquid IV which is helping to rehydrate him.  Pt denied dizziness and fever.  Pt stated everyone in his house along with grandchildren all are sick w/this stomach virus.  Pt stated he is definitely not coming on 09/18/2023 for his chemo tx and would like to cancel the appts.  Pt refused prescription for Lomotil.  Notified Dr. Mosetta Putt and her Team of the pt's call.

## 2023-09-18 ENCOUNTER — Inpatient Hospital Stay: Payer: BC Managed Care – PPO | Admitting: Hematology

## 2023-09-18 ENCOUNTER — Inpatient Hospital Stay: Payer: BC Managed Care – PPO

## 2023-09-19 ENCOUNTER — Telehealth: Payer: Self-pay | Admitting: Hematology

## 2023-09-19 NOTE — Telephone Encounter (Signed)
Patient is aware of scheduled appointment times/dates

## 2023-09-25 ENCOUNTER — Encounter: Payer: Self-pay | Admitting: Hematology

## 2023-09-25 ENCOUNTER — Inpatient Hospital Stay: Payer: BC Managed Care – PPO | Attending: Hematology | Admitting: Hematology

## 2023-09-25 DIAGNOSIS — K5900 Constipation, unspecified: Secondary | ICD-10-CM | POA: Insufficient documentation

## 2023-09-25 DIAGNOSIS — C787 Secondary malignant neoplasm of liver and intrahepatic bile duct: Secondary | ICD-10-CM | POA: Diagnosis not present

## 2023-09-25 DIAGNOSIS — G620 Drug-induced polyneuropathy: Secondary | ICD-10-CM | POA: Insufficient documentation

## 2023-09-25 DIAGNOSIS — C250 Malignant neoplasm of head of pancreas: Secondary | ICD-10-CM | POA: Insufficient documentation

## 2023-09-25 DIAGNOSIS — C259 Malignant neoplasm of pancreas, unspecified: Secondary | ICD-10-CM

## 2023-09-25 DIAGNOSIS — K219 Gastro-esophageal reflux disease without esophagitis: Secondary | ICD-10-CM | POA: Insufficient documentation

## 2023-09-25 DIAGNOSIS — Z5111 Encounter for antineoplastic chemotherapy: Secondary | ICD-10-CM | POA: Insufficient documentation

## 2023-09-25 DIAGNOSIS — Z452 Encounter for adjustment and management of vascular access device: Secondary | ICD-10-CM | POA: Insufficient documentation

## 2023-09-25 DIAGNOSIS — R5383 Other fatigue: Secondary | ICD-10-CM | POA: Insufficient documentation

## 2023-09-25 NOTE — Assessment & Plan Note (Signed)
metastatic to liver, stage IV, KRAS G12R (+), MMR normal  -incidental finding on lung cancer screening chest CT on 01/19/22. 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  -He began first-line palliative FOLFIRINOX on 03/13/22, but unfortunately did not respond well. CA 19-9 also rose on treatment. -he switched to second line gemcitabine/abraxane on 06/05/22. He tolerates moderate well and has continued to work full time. CA 19-9 has dropped on treatment. -restaging CT from 08/24/22 showed partial response to treatment, improved primary tumor, and liver/node mets, I discussed with him  -Pt was referred to see Dr. [name] on 10/10/2022 , surgery was discussed but not felt to be a surgical candidate due to his multiple liver metastasis (5) -Restaging CT scan on November 24, 2022 showed moderate disease progression in liver and the pancreas, his tumor marker also increased.  I have changed his chemotherapy to 5-FU and liposomal irinotecan, started on 12/04/22, he is tolerating well  -Restaging scan from March 06, 2023 showed slightly decreased pancreatic mass, but he has slight progression in liver metastasis.  Has been trending up lately, also indicating a mild progression. -We discussed there is very limited treatment option at this point, I would recommend him to try docetaxel, gemcitabine and capecitabine as next line therapy. -pt is clinically doing well, given the limited treatment options, we decided to continue current therapy until he has further disease progression. -I reviewed his Guardant360 results, which is negative for targetable mutations  -restaging CT on 05/30/2023 showed overall stable disease, but probably a tiny new liver metastatic lesion.  His tumor marker has slightly increased lately.  Given the limited treatment options, I continued same chemo -restaging CT September 17, 2023 showed disease  progression in liver and abdominal adenopathy -We discussed the limited treatment options, and I recommend FOLFOX or docetaxel, gemcitabine and 5-FU

## 2023-09-25 NOTE — Progress Notes (Signed)
[facility]   [phone]) [phone] [phone]) [phone]   Clinic Follow up Note   Patient Care Team: [name], MD as PCP - General (Family Medicine) [name], MD as Consulting Physician (Oncology) [name], Revonda Standard., MD as Consulting Physician (General Surgery) Diagnostic Radiology [facility] Imaging, [place] as Radiologist (Radiology) Diagnostic Radiology [facility], [facility] as Radiologist (Diagnostic Radiology) 09/25/2023  I connected with Asa Saunas on 09/25/23 at  2:00 PM EST by telephone and verified that I am speaking with the correct person using two identifiers.   I discussed the limitations, risks, security and privacy concerns of performing an evaluation and management service by telephone and the availability of in person appointments. I also discussed with the patient that there may be a patient responsible charge related to this service. The patient expressed understanding and agreed to proceed.   Patient's location:  Home  Provider's location:  Office    CHIEF COMPLAINT: Review of CT scan result   CURRENT THERAPY: Chemotherapy liposomal irinotecan and 5-FU  Oncology history Pancreatic cancer metastasized to liver [facility]) metastatic to liver, stage IV, KRAS G12R (+), MMR normal  -incidental finding on lung cancer screening chest CT on 01/19/22. 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  -He began first-line palliative FOLFIRINOX on 03/13/22, but unfortunately did not respond well. CA 19-9 also rose on treatment. -he switched to second line gemcitabine/abraxane on 06/05/22. He tolerates moderate well and has continued to work full time. CA 19-9 has dropped on treatment. -restaging CT from 08/24/22 showed partial response to treatment, improved primary tumor, and liver/node mets, I discussed with him  -Pt was referred to see Dr. [name] on 10/10/2022 , surgery  was discussed but not felt to be a surgical candidate due to his multiple liver metastasis (5) -Restaging CT scan on November 24, 2022 showed moderate disease progression in liver and the pancreas, his tumor marker also increased.  I have changed his chemotherapy to 5-FU and liposomal irinotecan, started on 12/04/22, he is tolerating well  -Restaging scan from March 06, 2023 showed slightly decreased pancreatic mass, but he has slight progression in liver metastasis.  Has been trending up lately, also indicating a mild progression. -We discussed there is very limited treatment option at this point, I would recommend him to try docetaxel, gemcitabine and capecitabine as next line therapy. -pt is clinically doing well, given the limited treatment options, we decided to continue current therapy until he has further disease progression. -I reviewed his Guardant360 results, which is negative for targetable mutations  -restaging CT on 05/30/2023 showed overall stable disease, but probably a tiny new liver metastatic lesion.  His tumor marker has slightly increased lately.  Given the limited treatment options, I continued same chemo -restaging CT September 17, 2023 showed disease progression in liver and abdominal adenopathy -We discussed the limited treatment options, and I recommend FOLFOX or docetaxel, gemcitabine and 5-FU  Assessment and Plan    Pancreatic Cancer with Liver Metastasis Progression of pancreatic cancer with liver metastasis. Recent CT shows increased liver lesions and new lesions; pancreatic tumor size is stable. Tumor marker increased from 170 in December to 215. Current treatment regimen has been in place for ten months. Discussed switching to FOLFOX regimen with low dose oxaliplatin due to disease progression. Explained risks of oxaliplatin including cold sensitivity and neuropathy. Alternative regimen includes gemcitabine, docetaxel, and Xeloda, but response rate  may be lower due to prior  exposure to different medications. Patient prefers FOLFOX regimen despite potential side effects. 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 Neuropathy is a known side effect of oxaliplatin. Patient has pre-existing mild neuropathy in hands and feet. - Monitor neuropathy symptoms closely during FOLFOX treatment  Pulmonary Embolism No new findings of blood clots in the recent CT scan. Currently on anticoagulants. - Continue current anticoagulant regimen  Gastroenteritis Recent episode of vomiting for four days and diarrhea for eight days, likely viral as family members had similar symptoms. Lost 13 pounds but has mostly regained the weight. - Encourage adequate hydration and nutrition - Monitor for any recurrence of symptoms  Plan -Restaging CT scan reviewed, unfortunately he progressed. -Will change chemotherapy to FOLFOX with dose reduction, plan to start next week.  He has recovered well from his gastritis. -f/u next week      SUMMARY OF ONCOLOGIC HISTORY: Oncology History Overview Note   Cancer Staging  Pancreatic cancer metastasized to liver [facility]) Staging form: Exocrine Pancreas, AJCC 8th Edition - Clinical stage from 02/28/2022: Stage IV (cT2, cN1, pM1) - Signed by [name], MD on 03/02/2022 Stage prefix: Initial diagnosis     Pancreatic cancer metastasized to liver (HCC)  02/28/2022 Cancer Staging   Staging form: Exocrine Pancreas, AJCC 8th Edition - Clinical stage from 02/28/2022: Stage IV (cT2, cN1, pM1) - Signed by [name], MD on 03/02/2022 Stage prefix: Initial diagnosis   03/02/2022 Initial Diagnosis   Pancreatic cancer metastasized to liver (HCC)   03/13/2022 - 04/13/2022 Chemotherapy   Patient is on Treatment Plan : PANCREAS Modified  FOLFIRINOX q14d x 4 cycles     03/13/2022 - 05/17/2022 Chemotherapy   Patient is on Treatment Plan : PANCREAS Modified FOLFIRINOX q14d x 4 cycles     03/18/2022 Genetic Testing   Negative genetic testing on the CancerNext-Expanded+RNAinsight panel.  APC [id number] VUs identified.  The report date is March 18, 2022.  The CancerNext-Expanded gene panel offered by [facility] and includes sequencing and rearrangement analysis for the following 77 genes: AIP, ALK, APC*, ATM*, AXIN2, BAP1, BARD1, BLM, BMPR1A, BRCA1*, BRCA2*, BRIP1*, CDC73, CDH1*, CDK4, CDKN1B, CDKN2A, CHEK2*, CTNNA1, DICER1, FANCC, FH, FLCN, GALNT12, KIF1B, LZTR1, MAX, MEN1, MET, MLH1*, MSH2*, MSH3, MSH6*, MUTYH*, NBN, NF1*, NF2, NTHL1, PALB2*, PHOX2B, PMS2*, POT1, PRKAR1A, PTCH1, PTEN*, RAD51C*, RAD51D*, RB1, RECQL, RET, SDHA, SDHAF2, SDHB, SDHC, SDHD, SMAD4, SMARCA4, SMARCB1, SMARCE1, STK11, SUFU, TMEM127, TP53*, TSC1, TSC2, VHL and XRCC2 (sequencing and deletion/duplication); EGFR, EGLN1, HOXB13, KIT, MITF, PDGFRA, POLD1, and POLE (sequencing only); EPCAM and GREM1 (deletion/duplication only). DNA and RNA analyses performed for * genes.    05/25/2022 Progression   Rising CA 19-9, CT CAP IMPRESSION: 1. Multiple new and enlarged hypodense, rim enhancing liver metastases. 2. Unchanged, ill-defined hypoenhancing mass of the pancreatic uncinate, consistent with pancreatic adenocarcinoma 3. Unchanged small prominent portacaval and gastrohepatic ligament nodes, modestly suspicious for nodal metastases. No overtly enlarged lymph nodes in the abdomen or pelvis. 4. Prostatomegaly.    06/05/2022 - 11/13/2022 Chemotherapy   Patient is on Treatment Plan : PANCREATIC Abraxane D1,8,15 + Gemcitabine D1,8,15 q28d     11/24/2022 Imaging   IMPRESSION: 1. Today's study demonstrates mild progression of disease as evidenced by slight enlargement of the mass in the uncinate process of the pancreas which  demonstrates potential direct invasion into the  superior wall of the third portion of the duodenum, but currently demonstrates no definite vascular involvement. Multiple hepatic lesions are generally stable to the prior study, with exception of enlargement of a lesion in segment 8, as detailed above. 2. No new sites of metastatic disease are noted elsewhere in the chest or pelvis. 3. Aortic atherosclerosis.  Rising CA 19-9    12/04/2022 -  Chemotherapy   Patient is on Treatment Plan : PANCREAS Liposomal Irinotecan + Leucovorin + 5-FU IVCI q14d     03/06/2023 Imaging    IMPRESSION: 1. Mass in the pancreatic head demonstrates decreased size since prior study. 2. Multiple liver metastasis are mildly increased in size. No new lesions. 3. 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 Metastatic disease would be less likely. 4. Mild aortic atherosclerosis. 5. Nonspecific sclerotic foci in the pelvis, unchanged, likely benign bone islands.       Discussed the use of AI scribe software for clinical note transcription with the patient, who gave verbal consent to proceed.  History of Present Illness   The patient, with a history of pancreatic cancer with liver metastasis, presents for follow-up after his most recent chemotherapy cycle. He reports a significant illness following his last treatment, characterized by four days of vomiting and eight days of diarrhea, resulting in a thirteen-pound weight loss. He believes this was a stomach bug, as his family members experienced similar symptoms. Despite the severity of his symptoms, he managed to avoid hospitalization for IV fluids by consuming liquid IV and Pedialyte. He has since recovered and regained most of his lost weight.  The patient also has a history of blood clots in the lung, which he is managing with blood thinners. He reports no new symptoms related to  this condition.  He also has peripheral neuropathy, which is more noticeable in his feet than his hands. He describes the front third of his foot as feeling numb most of the time and being sensitive to bumps. He denies any functional impairment, such as dropping things or difficulty with small objects.         REVIEW OF SYSTEMS:   Constitutional: Denies fevers, chills or abnormal weight loss Eyes: Denies blurriness of vision Ears, nose, mouth, throat, and face: Denies mucositis or sore throat Respiratory: Denies cough, dyspnea or wheezes Cardiovascular: Denies palpitation, chest discomfort or lower extremity swelling Gastrointestinal:  Denies nausea, heartburn or change in bowel habits Skin: Denies abnormal skin rashes Lymphatics: Denies new lymphadenopathy or easy bruising Neurological:Denies numbness, tingling or new weaknesses Behavioral/Psych: Mood is stable, no new changes  All other systems were reviewed with the patient and are negative.  MEDICAL HISTORY:  Past Medical History:  Diagnosis Date   Family history of adverse reaction to anesthesia    mother allergic to ether    Family history of breast cancer    Family history of colon cancer    Pneumonia    hx of 2014     SURGICAL HISTORY: Past Surgical History:  Procedure Laterality Date   APPENDECTOMY     CHOLECYSTECTOMY N/A 07/12/2018   Procedure: LAPAROSCOPIC CHOLECYSTECTOMY WITH INTRAOPERATIVE CHOLANGIOGRAM;  Surgeon: Luretha Murphy, MD;  Location: [facility];  Service: General;  Laterality: N/A;   left wrist surgery      has plate in arm    NASAL POLYP SURGERY  2017   PORTACATH PLACEMENT  N/A 03/10/2022   Procedure: INSERTION PORT-A-CATH;  Surgeon: Fritzi Mandes, MD;  Location: [facility] OR;  Service: General;  Laterality: N/A;   VENTRAL HERNIA REPAIR N/A 07/15/2014   Procedure: LAPAROSCOPIC REPAIR INCARCARTED UMBILICAL  VENTRAL HERNIA, ;  Surgeon: Claud Kelp, MD;  Location: [facility];  Service: General;  Laterality: N/A;   With MESH    I have reviewed the social history and family history with the patient and they are unchanged from previous note.  ALLERGIES:  has no known allergies.  MEDICATIONS:  Current Outpatient Medications  Medication Sig Dispense Refill   apixaban (ELIQUIS) 5 MG TABS tablet Take 1 tablet (5 mg total) by mouth 2 (two) times daily. 60 tablet 3   KLOR-CON M20 20 MEQ tablet TAKE 1 TAB (20 MEQ) 2X DAILY FOR 7 DAYS AND THEN TAKE 1 TAB DAILY FOR REMAINDER OF THE MONTH. 37 tablet 0   lidocaine-prilocaine (EMLA) cream Apply to affected area once 30 g 3   lipase/protease/amylase (CREON) 36000 UNITS CPEP capsule Take 2 capsules (72,000 Units total) by mouth 3 (three) times daily with meals. May also take 1 capsule (36,000 Units total) as needed (with snacks). 240 capsule 3   loperamide (IMODIUM A-D) 2 MG tablet Take 2 mg by mouth 4 (four) times daily as needed for diarrhea or loose stools.     ondansetron (ZOFRAN) 8 MG tablet Take 1 tablet (8 mg total) by mouth every 8 (eight) hours as needed for nausea or vomiting. Start on the third day after irinotecan 30 tablet 1   prochlorperazine (COMPAZINE) 10 MG tablet Take 1 tablet (10 mg total) by mouth every 6 (six) hours as needed for nausea or vomiting. 30 tablet 1   valACYclovir (VALTREX) 500 MG tablet Take 1 tablet (500 mg total) by mouth 2 (two) times daily. 60 tablet 3   No current facility-administered medications for this visit.    PHYSICAL EXAMINATION: Not performed   LABORATORY DATA:  I have reviewed the data as listed    Latest Ref Rng & Units 09/04/2023   10:34 AM 08/21/2023   10:35 AM 07/30/2023   10:39 AM  CBC  WBC 4.0 - 10.5 K/uL 8.0  12.2  5.7   Hemoglobin 13.0 - 17.0 g/dL 16.1  09.6  04.5   Hematocrit 39.0 - 52.0 % 31.0  33.3  32.3   Platelets 150 - 400 K/uL 279  349  292         Latest Ref Rng & Units 09/04/2023   10:34 AM 08/21/2023   10:35 AM 07/30/2023   10:39 AM  CMP  Glucose 70 - 99 mg/dL 409  811  914   BUN 6 -  20 mg/dL 9  9  8    Creatinine 0.61 - 1.24 mg/dL 7.82  9.56  2.13   Sodium 135 - 145 mmol/L 136  138  137   Potassium 3.5 - 5.1 mmol/L 3.2  3.7  3.4   Chloride 98 - 111 mmol/L 103  105  105   CO2 22 - 32 mmol/L 26  25  25    Calcium 8.9 - 10.3 mg/dL 8.8  8.8  8.8   Total Protein 6.5 - 8.1 g/dL 6.1  6.3  6.1   Total Bilirubin 0.0 - 1.2 mg/dL 0.5  0.4  0.4   Alkaline Phos 38 - 126 U/L 85  92  90   AST 15 - 41 U/L 15  17  17    ALT 0 - 44 U/L 17  14  17       RADIOGRAPHIC STUDIES: I have personally reviewed the radiological images as listed and agreed with the findings in the report. No results found.     I discussed the assessment and treatment plan with the patient. The patient was provided an opportunity to ask questions and all were answered. The patient agreed with the plan and demonstrated an understanding of the instructions.   The patient was advised to call back or seek an in-person evaluation if the symptoms worsen or if the condition fails to improve as anticipated.  I provided 30 minutes of face-to-face video visit time during this encounter, and > 50% was spent counseling as documented under my assessment & plan.     [name], MD 09/25/23

## 2023-10-01 ENCOUNTER — Other Ambulatory Visit: Payer: Self-pay

## 2023-10-01 DIAGNOSIS — C259 Malignant neoplasm of pancreas, unspecified: Secondary | ICD-10-CM

## 2023-10-01 MED ORDER — ONDANSETRON HCL 8 MG PO TABS: 8.0000 mg | ORAL_TABLET | Freq: Three times a day (TID) | ORAL | 1 refills | Status: DC | PRN

## 2023-10-01 MED ORDER — PROCHLORPERAZINE MALEATE 10 MG PO TABS: 10.0000 mg | ORAL_TABLET | Freq: Four times a day (QID) | ORAL | 1 refills | Status: DC | PRN

## 2023-10-01 MED ORDER — DEXAMETHASONE 4 MG PO TABS: 8.0000 mg | ORAL_TABLET | Freq: Every day | ORAL | 1 refills | Status: DC

## 2023-10-01 NOTE — Assessment & Plan Note (Signed)
 metastatic to liver, stage IV, KRAS G12R (+), MMR normal  -incidental finding on lung cancer screening chest CT on 01/19/22. 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  -He began first-line palliative FOLFIRINOX on 03/13/22, but unfortunately did not respond well. CA 19-9 also rose on treatment. -he switched to second line gemcitabine /abraxane  on 06/05/22. He tolerates moderate well and has continued to work full time. CA 19-9 has dropped on treatment. -restaging CT from 08/24/22 showed partial response to treatment, improved primary tumor, and liver/node mets, I discussed with him  -Pt was referred to see Dr. Zani on 10/10/2022 , surgery was discussed but not felt to be a surgical candidate due to his multiple liver metastasis (5) -Restaging CT scan on November 24, 2022 showed moderate disease progression in liver and the pancreas, his tumor marker also increased.  I have changed his chemotherapy to 5-FU and liposomal irinotecan , started on 12/04/22, he is tolerating well  -Restaging scan from March 06, 2023 showed slightly decreased pancreatic mass, but he has slight progression in liver metastasis.  Has been trending up lately, also indicating a mild progression. -We discussed there is very limited treatment option at this point, I would recommend him to try docetaxel, gemcitabine  and capecitabine as next line therapy. -pt is clinically doing well, given the limited treatment options, we decided to continue current therapy until he has further disease progression. -I reviewed his Guardant360 results, which is negative for targetable mutations  -restaging CT on 05/30/2023 showed overall stable disease, but probably a tiny new liver metastatic lesion.  His tumor marker has slightly increased lately.  Given the limited treatment options, I continued same chemo -restaging CT September 17, 2023 showed disease  progression in liver and abdominal adenopathy -We discussed the limited treatment options, and I recommend FOLFOX or docetaxel, gemcitabine  and 5-FU. Plan to start FOLFOX on 10/02/2023

## 2023-10-02 ENCOUNTER — Inpatient Hospital Stay: Payer: BC Managed Care – PPO | Admitting: Hematology

## 2023-10-02 ENCOUNTER — Inpatient Hospital Stay: Payer: BC Managed Care – PPO

## 2023-10-02 ENCOUNTER — Other Ambulatory Visit: Payer: Self-pay

## 2023-10-02 ENCOUNTER — Encounter: Payer: Self-pay | Admitting: Hematology

## 2023-10-02 VITALS — BP 114/94 | HR 86 | Temp 98.0°F | Resp 17 | Wt 229.8 lb

## 2023-10-02 DIAGNOSIS — K5900 Constipation, unspecified: Secondary | ICD-10-CM | POA: Diagnosis not present

## 2023-10-02 DIAGNOSIS — C787 Secondary malignant neoplasm of liver and intrahepatic bile duct: Secondary | ICD-10-CM | POA: Diagnosis not present

## 2023-10-02 DIAGNOSIS — Z95828 Presence of other vascular implants and grafts: Secondary | ICD-10-CM

## 2023-10-02 DIAGNOSIS — Z5111 Encounter for antineoplastic chemotherapy: Secondary | ICD-10-CM | POA: Diagnosis not present

## 2023-10-02 DIAGNOSIS — C259 Malignant neoplasm of pancreas, unspecified: Secondary | ICD-10-CM

## 2023-10-02 DIAGNOSIS — C250 Malignant neoplasm of head of pancreas: Secondary | ICD-10-CM | POA: Diagnosis not present

## 2023-10-02 DIAGNOSIS — G620 Drug-induced polyneuropathy: Secondary | ICD-10-CM | POA: Diagnosis not present

## 2023-10-02 DIAGNOSIS — Z452 Encounter for adjustment and management of vascular access device: Secondary | ICD-10-CM | POA: Diagnosis not present

## 2023-10-02 DIAGNOSIS — K219 Gastro-esophageal reflux disease without esophagitis: Secondary | ICD-10-CM | POA: Diagnosis not present

## 2023-10-02 DIAGNOSIS — R5383 Other fatigue: Secondary | ICD-10-CM | POA: Diagnosis not present

## 2023-10-02 LAB — CBC WITH DIFFERENTIAL (CANCER CENTER ONLY)
Abs Immature Granulocytes: 0.15 10*3/uL — ABNORMAL HIGH (ref 0.00–0.07)
Basophils Absolute: 0.2 10*3/uL — ABNORMAL HIGH (ref 0.0–0.1)
Basophils Relative: 1 %
Eosinophils Absolute: 1.1 10*3/uL — ABNORMAL HIGH (ref 0.0–0.5)
Eosinophils Relative: 7 %
HCT: 32.6 % — ABNORMAL LOW (ref 39.0–52.0)
Hemoglobin: 10.5 g/dL — ABNORMAL LOW (ref 13.0–17.0)
Immature Granulocytes: 1 %
Lymphocytes Relative: 15 %
Lymphs Abs: 2.4 10*3/uL (ref 0.7–4.0)
MCH: 28.2 pg (ref 26.0–34.0)
MCHC: 32.2 g/dL (ref 30.0–36.0)
MCV: 87.4 fL (ref 80.0–100.0)
Monocytes Absolute: 1.5 10*3/uL — ABNORMAL HIGH (ref 0.1–1.0)
Monocytes Relative: 9 %
Neutro Abs: 10.5 10*3/uL — ABNORMAL HIGH (ref 1.7–7.7)
Neutrophils Relative %: 67 %
Platelet Count: 423 10*3/uL — ABNORMAL HIGH (ref 150–400)
RBC: 3.73 MIL/uL — ABNORMAL LOW (ref 4.22–5.81)
RDW: 15.2 % (ref 11.5–15.5)
WBC Count: 15.9 10*3/uL — ABNORMAL HIGH (ref 4.0–10.5)
nRBC: 0 % (ref 0.0–0.2)

## 2023-10-02 LAB — CMP (CANCER CENTER ONLY)
ALT: 19 U/L (ref 0–44)
AST: 20 U/L (ref 15–41)
Albumin: 3.6 g/dL (ref 3.5–5.0)
Alkaline Phosphatase: 140 U/L — ABNORMAL HIGH (ref 38–126)
Anion gap: 7 (ref 5–15)
BUN: 11 mg/dL (ref 6–20)
CO2: 26 mmol/L (ref 22–32)
Calcium: 9 mg/dL (ref 8.9–10.3)
Chloride: 102 mmol/L (ref 98–111)
Creatinine: 0.76 mg/dL (ref 0.61–1.24)
GFR, Estimated: >60 mL/min (ref >60)
Glucose, Bld: 127 mg/dL — ABNORMAL HIGH (ref 70–99)
Potassium: 4 mmol/L (ref 3.5–5.1)
Sodium: 135 mmol/L (ref 135–145)
Total Bilirubin: 0.5 mg/dL (ref 0.0–1.2)
Total Protein: 6.9 g/dL (ref 6.5–8.1)

## 2023-10-02 MED ORDER — SODIUM CHLORIDE 0.9 % IV SOLN
2400.0000 mg/m2 | INTRAVENOUS | Status: DC
  Administered 2023-10-02: 5000 mg via INTRAVENOUS
  Filled 2023-10-02: qty 100

## 2023-10-02 MED ORDER — DEXTROSE 5 % IV SOLN: INTRAVENOUS | Status: DC

## 2023-10-02 MED ORDER — OXALIPLATIN CHEMO INJECTION 100 MG/20ML
70.0000 mg/m2 | Freq: Once | INTRAVENOUS | Status: AC
  Administered 2023-10-02: 150 mg via INTRAVENOUS
  Filled 2023-10-02: qty 10

## 2023-10-02 MED ORDER — LEUCOVORIN CALCIUM INJECTION 350 MG
400.0000 mg/m2 | Freq: Once | INTRAVENOUS | Status: AC
  Administered 2023-10-02: 880 mg via INTRAVENOUS
  Filled 2023-10-02: qty 44

## 2023-10-02 MED ORDER — FAMOTIDINE IN NACL 20-0.9 MG/50ML-% IV SOLN
20.0000 mg | Freq: Once | INTRAVENOUS | Status: AC
  Administered 2023-10-02: 20 mg via INTRAVENOUS
  Filled 2023-10-02: qty 50

## 2023-10-02 MED ORDER — PALONOSETRON HCL INJECTION 0.25 MG/5ML
0.2500 mg | Freq: Once | INTRAVENOUS | Status: AC
  Administered 2023-10-02: 0.25 mg via INTRAVENOUS
  Filled 2023-10-02: qty 5

## 2023-10-02 MED ORDER — SODIUM CHLORIDE 0.9% FLUSH
10.0000 mL | Freq: Once | INTRAVENOUS | Status: AC
  Administered 2023-10-02: 10 mL

## 2023-10-02 MED ORDER — DEXAMETHASONE SODIUM PHOSPHATE 10 MG/ML IJ SOLN
10.0000 mg | Freq: Once | INTRAMUSCULAR | Status: AC
  Administered 2023-10-02: 10 mg via INTRAVENOUS
  Filled 2023-10-02: qty 1

## 2023-10-02 MED ORDER — LORATADINE 10 MG PO TABS
10.0000 mg | ORAL_TABLET | Freq: Every day | ORAL | Status: DC
  Administered 2023-10-02: 10 mg via ORAL
  Filled 2023-10-02: qty 1

## 2023-10-02 NOTE — Progress Notes (Signed)
[facility]   [phone] Fax:([phone]) [phone]   Clinic Follow up Note   Patient Care Team: [name], MD as PCP - General (Family Medicine) [name], MD as Consulting Physician (Oncology) [name], Revonda Standard., MD as Consulting Physician (General Surgery) [facility] as Radiologist (Radiology) [facility] as Radiologist (Diagnostic Radiology)  Date of Service:  10/02/2023  CHIEF COMPLAINT: f/u of pancreatic cancer  CURRENT THERAPY:  First-line FOLFOX  Oncology History   Pancreatic cancer metastasized to liver [facility]) metastatic to liver, stage IV, KRAS G12R (+), MMR normal  -incidental finding on lung cancer screening chest CT on 01/19/22. 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  -He began first-line palliative FOLFIRINOX on 03/13/22, but unfortunately did not respond well. CA 19-9 also rose on treatment. -he switched to second line gemcitabine/abraxane on 06/05/22. He tolerates moderate well and has continued to work full time. CA 19-9 has dropped on treatment. -restaging CT from 08/24/22 showed partial response to treatment, improved primary tumor, and liver/node mets, I discussed with him  -Pt was referred to see Dr. [name] on 10/10/2022 , surgery was discussed but not felt to be a surgical candidate due to his multiple liver metastasis (5) -Restaging CT scan on November 24, 2022 showed moderate disease progression in liver and the pancreas, his tumor marker also increased.  I have changed his chemotherapy to 5-FU and liposomal irinotecan, started on 12/04/22, he is tolerating well  -Restaging scan from March 06, 2023 showed slightly decreased pancreatic mass, but he has slight progression in liver metastasis.  Has been trending up lately, also indicating a mild progression. -We discussed there is very  limited treatment option at this point, I would recommend him to try docetaxel, gemcitabine and capecitabine as next line therapy. -pt is clinically doing well, given the limited treatment options, we decided to continue current therapy until he has further disease progression. -I reviewed his Guardant360 results, which is negative for targetable mutations  -restaging CT on 05/30/2023 showed overall stable disease, but probably a tiny new liver metastatic lesion.  His tumor marker has slightly increased lately.  Given the limited treatment options, I continued same chemo -restaging CT September 17, 2023 showed disease progression in liver and abdominal adenopathy -We discussed the limited treatment options, and I recommend FOLFOX or docetaxel, gemcitabine and 5-FU. Plan to start FOLFOX on 10/02/2023  Assessment and Plan    Pancreatic Cancer Follow-up for pancreatic cancer. Reports significant fatigue and 5-pound weight loss. Recovered from recent gastroenteritis but continues to experience fatigue. Blood counts: WBC 15.9, hemoglobin 10.5. Liver function and calcium levels are normal. Starting new chemotherapy regimen with oxaliplatin and 5-FU (dose reduced due to previous neuropathy). Informed about cold sensitivity and taste changes. Open to clinical trials and further genetic testing. Discussed potential next line treatment with trypsidabine and docetaxel if current regimen fails. Discussed clinical trials at [place], [place], and [place]. Patient willing to try clinical trials. - Administer oxaliplatin and 5-FU pump today  - Advise no cold drinks or food - Wear a mask in cold weather - Consider next line treatment with trypsidabine and docetaxel if current regimen fails - Perform Gaudium 360 blood test for new mutations - Refer to clinical trials if standard treatments are exhausted  Chemotherapy-Induced Fatigue Significant fatigue likely related to chemotherapy. Inquires about steroids to improve  strength. Will  receive dexamethasone today and instructed to take 4 mg PO daily for 2-3 days post-chemotherapy. 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 - Monitor neuropathy symptoms - Adjust chemotherapy dosage as needed  General Health Maintenance Recovered from recent gastroenteritis. Advised to stay hydrated and schedule additional chemotherapy cycles. - Ensure adequate hydration - Schedule additional chemotherapy cycles - Follow up in two weeks.      Plan -Lab reviewed, adequate for treatment, will proceed for cycle FOLFOX today at the reduced dose -Lab, follow-up and treatment in 2 weeks   SUMMARY OF ONCOLOGIC HISTORY: Oncology History Overview Note   Cancer Staging  Pancreatic cancer metastasized to liver [facility]) Staging form: Exocrine Pancreas, AJCC 8th Edition - Clinical stage from 02/28/2022: Stage IV (cT2, cN1, pM1) - Signed by [name], MD on 03/02/2022 Stage prefix: Initial diagnosis     Pancreatic cancer metastasized to liver (HCC)  02/28/2022 Cancer Staging   Staging form: Exocrine Pancreas, AJCC 8th Edition - Clinical stage from 02/28/2022: Stage IV (cT2, cN1, pM1) - Signed by [name], MD on 03/02/2022 Stage prefix: Initial diagnosis   03/02/2022 Initial Diagnosis   Pancreatic cancer metastasized to liver (HCC)   03/13/2022 - 04/13/2022 Chemotherapy   Patient is on Treatment Plan : PANCREAS Modified FOLFIRINOX q14d x 4 cycles     03/13/2022 - 05/17/2022 Chemotherapy   Patient is on Treatment Plan : PANCREAS Modified FOLFIRINOX q14d x 4 cycles     03/18/2022 Genetic Testing   Negative genetic testing on the CancerNext-Expanded+RNAinsight panel.  APC c.[id number] VUs identified.  The report date is March 18, 2022.  The CancerNext-Expanded gene panel offered by [facility] and includes sequencing and rearrangement analysis  for the following 77 genes: AIP, ALK, APC*, ATM*, AXIN2, BAP1, BARD1, BLM, BMPR1A, BRCA1*, BRCA2*, BRIP1*, CDC73, CDH1*, CDK4, CDKN1B, CDKN2A, CHEK2*, CTNNA1, DICER1, FANCC, FH, FLCN, GALNT12, KIF1B, LZTR1, MAX, MEN1, MET, MLH1*, MSH2*, MSH3, MSH6*, MUTYH*, NBN, NF1*, NF2, NTHL1, PALB2*, PHOX2B, PMS2*, POT1, PRKAR1A, PTCH1, PTEN*, RAD51C*, RAD51D*, RB1, RECQL, RET, SDHA, SDHAF2, SDHB, SDHC, SDHD, SMAD4, SMARCA4, SMARCB1, SMARCE1, STK11, SUFU, TMEM127, TP53*, TSC1, TSC2, VHL and XRCC2 (sequencing and deletion/duplication); EGFR, EGLN1, HOXB13, KIT, MITF, PDGFRA, POLD1, and POLE (sequencing only); EPCAM and GREM1 (deletion/duplication only). DNA and RNA analyses performed for * genes.    05/25/2022 Progression   Rising CA 19-9, CT CAP IMPRESSION: 1. Multiple new and enlarged hypodense, rim enhancing liver metastases. 2. Unchanged, ill-defined hypoenhancing mass of the pancreatic uncinate, consistent with pancreatic adenocarcinoma 3. Unchanged small prominent portacaval and gastrohepatic ligament nodes, modestly suspicious for nodal metastases. No overtly enlarged lymph nodes in the abdomen or pelvis. 4. Prostatomegaly.    06/05/2022 - 11/13/2022 Chemotherapy   Patient is on Treatment Plan : PANCREATIC Abraxane D1,8,15 + Gemcitabine D1,8,15 q28d     11/24/2022 Imaging   IMPRESSION: 1. Today's study demonstrates mild progression of disease as evidenced by slight enlargement of the mass in the uncinate process of the pancreas which demonstrates potential direct invasion into the superior wall of the third portion of the duodenum, but currently demonstrates no definite vascular involvement. Multiple hepatic lesions are generally stable to the prior study, with exception of enlargement of a lesion in segment 8, as detailed above. 2. No new sites of metastatic disease are noted elsewhere in the chest or pelvis. 3. Aortic atherosclerosis.  Rising CA 19-9    12/04/2022 - 09/04/2023 Chemotherapy   Patient is  on Treatment Plan : PANCREAS Liposomal Irinotecan + Leucovorin + 5-FU IVCI q14d     03/06/2023 Imaging    IMPRESSION: 1. Mass in the pancreatic head demonstrates decreased size since prior study. 2. Multiple liver metastasis are mildly increased in size. No new lesions. 3. 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 Metastatic disease would be less likely. 4. Mild aortic atherosclerosis. 5. Nonspecific sclerotic foci in the pelvis, unchanged, likely benign bone islands.     10/02/2023 -  Chemotherapy   Patient is on Treatment Plan : GASTRIC FOLFOX q14d x 12 cycles        Discussed the use of AI scribe software for clinical note transcription with the patient, who gave verbal consent to proceed.  History of Present Illness   Steve Mills, a 58 year old gentleman with a known diagnosis of pancreatic cancer, presents for follow-up. He recently recovered from a stomach bug, which has left him feeling extremely fatigued. He reports significant tiredness, to the point where he needs to sit down after minimal exertion such as walking around the yard. He has also lost approximately five pounds in weight. Despite this, he reports that his appetite is normal. He has not had diarrhea for about one and a half to two weeks and his bowel movements have returned to normal. He occasionally feels a little nauseous, but attributes this to sinus drainage rather than a symptom of his cancer or treatment. He denies any fever or chills. He also reports some residual neuropathy, particularly in his feet, from previous chemotherapy.         All other systems were reviewed with the patient and are negative.  MEDICAL HISTORY:  Past Medical History:  Diagnosis Date   Family history of adverse reaction to anesthesia    mother allergic to ether    Family history of breast cancer     Family history of colon cancer    Pneumonia    hx of 2014     SURGICAL HISTORY: Past Surgical History:  Procedure Laterality Date   APPENDECTOMY     CHOLECYSTECTOMY N/A 07/12/2018   Procedure: LAPAROSCOPIC CHOLECYSTECTOMY WITH INTRAOPERATIVE CHOLANGIOGRAM;  Surgeon: Luretha Murphy, MD;  Location: [facility];  Service: General;  Laterality: N/A;   left wrist surgery      has plate in arm    NASAL POLYP SURGERY  2017   PORTACATH PLACEMENT N/A 03/10/2022   Procedure: INSERTION PORT-A-CATH;  Surgeon: Fritzi Mandes, MD;  Location: [facility];  Service: General;  Laterality: N/A;   VENTRAL HERNIA REPAIR N/A 07/15/2014   Procedure: LAPAROSCOPIC REPAIR INCARCARTED UMBILICAL  VENTRAL HERNIA, ;  Surgeon: Claud Kelp, MD;  Location: [facility];  Service: General;  Laterality: N/A;  With MESH    I have reviewed the social history and family history with the patient and they are unchanged from previous note.  ALLERGIES:  has no known allergies.  MEDICATIONS:  Current Outpatient Medications  Medication Sig Dispense Refill   apixaban (ELIQUIS) 5 MG TABS tablet Take 1 tablet (5 mg total) by mouth 2 (two) times daily. 60 tablet 3   dexamethasone (DECADRON) 4 MG tablet Take 2 tablets (8 mg total) by mouth daily. Start the day after chemotherapy for 2 days. Take with food. 30 tablet 1   KLOR-CON M20 20 MEQ tablet TAKE 1 TAB (20 MEQ) 2X DAILY FOR 7 DAYS AND THEN TAKE 1 TAB DAILY FOR  REMAINDER OF THE MONTH. 37 tablet 0   lipase/protease/amylase (CREON) 36000 UNITS CPEP capsule Take 2 capsules (72,000 Units total) by mouth 3 (three) times daily with meals. May also take 1 capsule (36,000 Units total) as needed (with snacks). 240 capsule 3   loperamide (IMODIUM A-D) 2 MG tablet Take 2 mg by mouth 4 (four) times daily as needed for diarrhea or loose stools.     ondansetron (ZOFRAN) 8 MG tablet Take 1 tablet (8 mg total) by mouth every 8 (eight) hours as needed for nausea or vomiting. Start on the third day after  chemotherapy. 30 tablet 1   prochlorperazine (COMPAZINE) 10 MG tablet Take 1 tablet (10 mg total) by mouth every 6 (six) hours as needed for nausea or vomiting. 30 tablet 1   valACYclovir (VALTREX) 500 MG tablet Take 1 tablet (500 mg total) by mouth 2 (two) times daily. 60 tablet 3   No current facility-administered medications for this visit.   Facility-Administered Medications Ordered in Other Visits  Medication Dose Route Frequency Provider Last Rate Last Admin   dextrose 5 % solution   Intravenous Continuous [name], MD   Stopped at 10/02/23 1552   fluorouracil (ADRUCIL) 5,000 mg in sodium chloride 0.9 % 150 mL chemo infusion  2,400 mg/m2 (Order-Specific) Intravenous 1 day or 1 dose [name], MD   Infusion Verify at 10/02/23 1559   loratadine (CLARITIN) tablet 10 mg  10 mg Oral Daily [name], MD   10 mg at 10/02/23 1242    PHYSICAL EXAMINATION: ECOG PERFORMANCE STATUS: 1 - Symptomatic but completely ambulatory  Vitals:   10/02/23 1159  BP: (!) 114/94  Pulse: 86  Resp: 17  Temp: 98 F (36.7 C)  SpO2: 96%   Wt Readings from Last 3 Encounters:  10/02/23 104.2 kg  09/04/23 106.7 kg  08/21/23 106.5 kg     GENERAL:alert, no distress and comfortable SKIN: skin color, texture, turgor are normal, no rashes or significant lesions EYES: normal, Conjunctiva are pink and non-injected, sclera clear NECK: supple, thyroid normal size, non-tender, without nodularity LYMPH:  no palpable lymphadenopathy in the cervical, axillary  LUNGS: clear to auscultation and percussion with normal breathing effort HEART: regular rate & rhythm and no murmurs and no lower extremity edema ABDOMEN:abdomen soft, non-tender and normal bowel sounds Musculoskeletal:no cyanosis of digits and no clubbing  NEURO: alert & oriented x 3 with fluent speech, no focal motor/sensory deficits  Physical Exam   MEASUREMENTS: WT- 5 pounds lost SKIN: Tenting indicating dehydration      LABORATORY DATA:  I have  reviewed the data as listed    Latest Ref Rng & Units 10/02/2023   11:19 AM 09/04/2023   10:34 AM 08/21/2023   10:35 AM  CBC  WBC 4.0 - 10.5 K/uL 15.9  8.0  12.2   Hemoglobin 13.0 - 17.0 g/dL 40.9  81.1  91.4   Hematocrit 39.0 - 52.0 % 32.6  31.0  33.3   Platelets 150 - 400 K/uL 423  279  349         Latest Ref Rng & Units 10/02/2023   11:19 AM 09/04/2023   10:34 AM 08/21/2023   10:35 AM  CMP  Glucose 70 - 99 mg/dL 782  956  213   BUN 6 - 20 mg/dL 11  9  9    Creatinine 0.61 - 1.24 mg/dL 0.86  5.78  4.69   Sodium 135 - 145 mmol/L 135  136  138   Potassium 3.5 -  5.1 mmol/L 4.0  3.2  3.7   Chloride 98 - 111 mmol/L 102  103  105   CO2 22 - 32 mmol/L 26  26  25    Calcium 8.9 - 10.3 mg/dL 9.0  8.8  8.8   Total Protein 6.5 - 8.1 g/dL 6.9  6.1  6.3   Total Bilirubin 0.0 - 1.2 mg/dL 0.5  0.5  0.4   Alkaline Phos 38 - 126 U/L 140  85  92   AST 15 - 41 U/L 20  15  17    ALT 0 - 44 U/L 19  17  14        RADIOGRAPHIC STUDIES: I have personally reviewed the radiological images as listed and agreed with the findings in the report. No results found.    No orders of the defined types were placed in this encounter.  All questions were answered. The patient knows to call the clinic with any problems, questions or concerns. No barriers to learning was detected. The total time spent in the appointment was 25 minutes.     [name], MD 10/02/2023

## 2023-10-02 NOTE — Patient Instructions (Signed)
[facility] - A DEPT OF [facility]  Discharge Instructions: Thank you for choosing [facility] to provide your oncology and hematology care.   If you have a lab appointment with the Cancer Center, please go directly to the Cancer Center and check in at the registration area.   Wear comfortable clothing and clothing appropriate for easy access to any Portacath or PICC line.   We strive to give you quality time with your provider. You may need to reschedule your appointment if you arrive late (15 or more minutes).  Arriving late affects you and other patients whose appointments are after yours.  Also, if you miss three or more appointments without notifying the office, you may be dismissed from the clinic at the provider's discretion.      For prescription refill requests, have your pharmacy contact our office and allow 72 hours for refills to be completed.    Today you received the following chemotherapy and/or immunotherapy agents Oxaliplatin, Leukovorin, 5FU      To help prevent nausea and vomiting after your treatment, we encourage you to take your nausea medication as directed.  BELOW ARE SYMPTOMS THAT SHOULD BE REPORTED IMMEDIATELY: *FEVER GREATER THAN 100.4 F (38 C) OR HIGHER *CHILLS OR SWEATING *NAUSEA AND VOMITING THAT IS NOT CONTROLLED WITH YOUR NAUSEA MEDICATION *UNUSUAL SHORTNESS OF BREATH *UNUSUAL BRUISING OR BLEEDING *URINARY PROBLEMS (pain or burning when urinating, or frequent urination) *BOWEL PROBLEMS (unusual diarrhea, constipation, pain near the anus) TENDERNESS IN MOUTH AND THROAT WITH OR WITHOUT PRESENCE OF ULCERS (sore throat, sores in mouth, or a toothache) UNUSUAL RASH, SWELLING OR PAIN  UNUSUAL VAGINAL DISCHARGE OR ITCHING   Items with * indicate a potential emergency and should be followed up as soon as possible or go to the Emergency Department if any problems should occur.  Please show the CHEMOTHERAPY ALERT CARD  or IMMUNOTHERAPY ALERT CARD at check-in to the Emergency Department and triage nurse.  Should you have questions after your visit or need to cancel or reschedule your appointment, please contact [facility] - A DEPT OF [facility]  Dept: [phone]  and follow the prompts.  Office hours are 8:00 a.m. to 4:30 p.m. Monday - Friday. Please note that voicemails left after 4:00 p.m. may not be returned until the following business day.  We are closed weekends and major holidays. You have access to a nurse at all times for urgent questions. Please call the main number to the clinic Dept: [phone] and follow the prompts.   For any non-urgent questions, you may also contact your provider using MyChart. We now offer e-Visits for anyone 76 and older to request care online for non-urgent symptoms. For details visit [email or web address].   Also download the MyChart app! Go to the app store, search "MyChart", open the app, select [place], and log in with your MyChart username and password.

## 2023-10-03 LAB — CANCER ANTIGEN 19-9: CA 19-9: 424 U/mL — ABNORMAL HIGH (ref 0–35)

## 2023-10-04 ENCOUNTER — Inpatient Hospital Stay: Payer: BC Managed Care – PPO

## 2023-10-04 ENCOUNTER — Other Ambulatory Visit: Payer: Self-pay

## 2023-10-04 VITALS — BP 120/88 | HR 78 | Temp 98.3°F | Resp 17

## 2023-10-04 DIAGNOSIS — C787 Secondary malignant neoplasm of liver and intrahepatic bile duct: Secondary | ICD-10-CM | POA: Diagnosis not present

## 2023-10-04 DIAGNOSIS — R5383 Other fatigue: Secondary | ICD-10-CM | POA: Diagnosis not present

## 2023-10-04 DIAGNOSIS — Z5111 Encounter for antineoplastic chemotherapy: Secondary | ICD-10-CM | POA: Diagnosis not present

## 2023-10-04 DIAGNOSIS — Z95828 Presence of other vascular implants and grafts: Secondary | ICD-10-CM

## 2023-10-04 DIAGNOSIS — K5900 Constipation, unspecified: Secondary | ICD-10-CM | POA: Diagnosis not present

## 2023-10-04 DIAGNOSIS — C250 Malignant neoplasm of head of pancreas: Secondary | ICD-10-CM | POA: Diagnosis not present

## 2023-10-04 DIAGNOSIS — G620 Drug-induced polyneuropathy: Secondary | ICD-10-CM | POA: Diagnosis not present

## 2023-10-04 DIAGNOSIS — Z452 Encounter for adjustment and management of vascular access device: Secondary | ICD-10-CM | POA: Diagnosis not present

## 2023-10-04 DIAGNOSIS — K219 Gastro-esophageal reflux disease without esophagitis: Secondary | ICD-10-CM | POA: Diagnosis not present

## 2023-10-04 MED ORDER — HEPARIN SOD (PORK) LOCK FLUSH 100 UNIT/ML IV SOLN
500.0000 [IU] | Freq: Once | INTRAVENOUS | Status: AC
  Administered 2023-10-04: 500 [IU]

## 2023-10-04 MED ORDER — SODIUM CHLORIDE 0.9% FLUSH
10.0000 mL | Freq: Once | INTRAVENOUS | Status: AC
  Administered 2023-10-04: 10 mL

## 2023-10-06 DIAGNOSIS — C787 Secondary malignant neoplasm of liver and intrahepatic bile duct: Secondary | ICD-10-CM | POA: Diagnosis not present

## 2023-10-06 DIAGNOSIS — C259 Malignant neoplasm of pancreas, unspecified: Secondary | ICD-10-CM | POA: Diagnosis not present

## 2023-10-16 ENCOUNTER — Inpatient Hospital Stay (HOSPITAL_BASED_OUTPATIENT_CLINIC_OR_DEPARTMENT_OTHER): Payer: BC Managed Care – PPO | Admitting: Hematology

## 2023-10-16 ENCOUNTER — Telehealth: Payer: Self-pay

## 2023-10-16 ENCOUNTER — Inpatient Hospital Stay: Payer: BC Managed Care – PPO

## 2023-10-16 ENCOUNTER — Encounter: Payer: Self-pay | Admitting: Hematology

## 2023-10-16 VITALS — BP 124/84 | HR 68 | Temp 97.3°F | Resp 15 | Wt 227.7 lb

## 2023-10-16 DIAGNOSIS — C259 Malignant neoplasm of pancreas, unspecified: Secondary | ICD-10-CM

## 2023-10-16 DIAGNOSIS — Z452 Encounter for adjustment and management of vascular access device: Secondary | ICD-10-CM | POA: Diagnosis not present

## 2023-10-16 DIAGNOSIS — Z5111 Encounter for antineoplastic chemotherapy: Secondary | ICD-10-CM | POA: Diagnosis not present

## 2023-10-16 DIAGNOSIS — G620 Drug-induced polyneuropathy: Secondary | ICD-10-CM | POA: Diagnosis not present

## 2023-10-16 DIAGNOSIS — K219 Gastro-esophageal reflux disease without esophagitis: Secondary | ICD-10-CM | POA: Diagnosis not present

## 2023-10-16 DIAGNOSIS — C787 Secondary malignant neoplasm of liver and intrahepatic bile duct: Secondary | ICD-10-CM | POA: Diagnosis not present

## 2023-10-16 DIAGNOSIS — C250 Malignant neoplasm of head of pancreas: Secondary | ICD-10-CM | POA: Diagnosis not present

## 2023-10-16 DIAGNOSIS — R5383 Other fatigue: Secondary | ICD-10-CM | POA: Diagnosis not present

## 2023-10-16 DIAGNOSIS — Z95828 Presence of other vascular implants and grafts: Secondary | ICD-10-CM

## 2023-10-16 DIAGNOSIS — K5900 Constipation, unspecified: Secondary | ICD-10-CM | POA: Diagnosis not present

## 2023-10-16 LAB — CBC WITH DIFFERENTIAL (CANCER CENTER ONLY)
Abs Immature Granulocytes: 0.07 10*3/uL (ref 0.00–0.07)
Basophils Absolute: 0.1 10*3/uL (ref 0.0–0.1)
Basophils Relative: 1 %
Eosinophils Absolute: 2.5 10*3/uL — ABNORMAL HIGH (ref 0.0–0.5)
Eosinophils Relative: 18 %
HCT: 29.7 % — ABNORMAL LOW (ref 39.0–52.0)
Hemoglobin: 9.7 g/dL — ABNORMAL LOW (ref 13.0–17.0)
Immature Granulocytes: 1 %
Lymphocytes Relative: 14 %
Lymphs Abs: 2 10*3/uL (ref 0.7–4.0)
MCH: 27.4 pg (ref 26.0–34.0)
MCHC: 32.7 g/dL (ref 30.0–36.0)
MCV: 83.9 fL (ref 80.0–100.0)
Monocytes Absolute: 1.4 10*3/uL — ABNORMAL HIGH (ref 0.1–1.0)
Monocytes Relative: 10 %
Neutro Abs: 8.4 10*3/uL — ABNORMAL HIGH (ref 1.7–7.7)
Neutrophils Relative %: 56 %
Platelet Count: 331 10*3/uL (ref 150–400)
RBC: 3.54 MIL/uL — ABNORMAL LOW (ref 4.22–5.81)
RDW: 16 % — ABNORMAL HIGH (ref 11.5–15.5)
WBC Count: 14.4 10*3/uL — ABNORMAL HIGH (ref 4.0–10.5)
nRBC: 0 % (ref 0.0–0.2)

## 2023-10-16 LAB — CMP (CANCER CENTER ONLY)
ALT: 50 U/L — ABNORMAL HIGH (ref 0–44)
AST: 38 U/L (ref 15–41)
Albumin: 3.3 g/dL — ABNORMAL LOW (ref 3.5–5.0)
Alkaline Phosphatase: 302 U/L — ABNORMAL HIGH (ref 38–126)
Anion gap: 7 (ref 5–15)
BUN: 10 mg/dL (ref 6–20)
CO2: 26 mmol/L (ref 22–32)
Calcium: 8.5 mg/dL — ABNORMAL LOW (ref 8.9–10.3)
Chloride: 102 mmol/L (ref 98–111)
Creatinine: 0.73 mg/dL (ref 0.61–1.24)
GFR, Estimated: >60 mL/min (ref >60)
Glucose, Bld: 125 mg/dL — ABNORMAL HIGH (ref 70–99)
Potassium: 4 mmol/L (ref 3.5–5.1)
Sodium: 135 mmol/L (ref 135–145)
Total Bilirubin: 0.5 mg/dL (ref 0.0–1.2)
Total Protein: 6.6 g/dL (ref 6.5–8.1)

## 2023-10-16 MED ORDER — SODIUM CHLORIDE 0.9% FLUSH
10.0000 mL | Freq: Once | INTRAVENOUS | Status: AC
  Administered 2023-10-16: 10 mL

## 2023-10-16 MED ORDER — SODIUM CHLORIDE 0.9% FLUSH
10.0000 mL | INTRAVENOUS | Status: DC | PRN
Start: 2023-10-16 — End: 2023-10-16

## 2023-10-16 MED ORDER — SODIUM CHLORIDE 0.9 % IV SOLN
2200.0000 mg/m2 | INTRAVENOUS | Status: DC
  Administered 2023-10-16: 5000 mg via INTRAVENOUS
  Filled 2023-10-16: qty 100

## 2023-10-16 MED ORDER — DEXAMETHASONE SODIUM PHOSPHATE 10 MG/ML IJ SOLN
10.0000 mg | Freq: Once | INTRAMUSCULAR | Status: AC
  Administered 2023-10-16: 10 mg via INTRAVENOUS
  Filled 2023-10-16: qty 1

## 2023-10-16 MED ORDER — FAMOTIDINE IN NACL 20-0.9 MG/50ML-% IV SOLN
20.0000 mg | Freq: Once | INTRAVENOUS | Status: AC
  Administered 2023-10-16: 20 mg via INTRAVENOUS
  Filled 2023-10-16: qty 50

## 2023-10-16 MED ORDER — DEXTROSE 5 % IV SOLN
INTRAVENOUS | Status: DC
Start: 2023-10-16 — End: 2023-10-16

## 2023-10-16 MED ORDER — OXALIPLATIN CHEMO INJECTION 100 MG/20ML
70.0000 mg/m2 | Freq: Once | INTRAVENOUS | Status: AC
  Administered 2023-10-16: 150 mg via INTRAVENOUS
  Filled 2023-10-16: qty 10

## 2023-10-16 MED ORDER — LEUCOVORIN CALCIUM INJECTION 350 MG
400.0000 mg/m2 | Freq: Once | INTRAVENOUS | Status: AC
  Administered 2023-10-16: 880 mg via INTRAVENOUS
  Filled 2023-10-16: qty 44

## 2023-10-16 MED ORDER — LORATADINE 10 MG PO TABS
10.0000 mg | ORAL_TABLET | Freq: Once | ORAL | Status: AC
  Administered 2023-10-16: 10 mg via ORAL
  Filled 2023-10-16: qty 1

## 2023-10-16 MED ORDER — HEPARIN SOD (PORK) LOCK FLUSH 100 UNIT/ML IV SOLN: 500.0000 [IU] | Freq: Once | INTRAVENOUS | Status: DC | PRN

## 2023-10-16 MED ORDER — PALONOSETRON HCL INJECTION 0.25 MG/5ML
0.2500 mg | Freq: Once | INTRAVENOUS | Status: AC
  Administered 2023-10-16: 0.25 mg via INTRAVENOUS
  Filled 2023-10-16: qty 5

## 2023-10-16 MED ORDER — LIDOCAINE-PRILOCAINE 2.5-2.5 % EX CREA: 1.0000 | TOPICAL_CREAM | CUTANEOUS | 2 refills | Status: DC | PRN

## 2023-10-16 NOTE — Progress Notes (Signed)
 [facility]   [phone] [phone]   Clinic Follow up Note   Patient Care Team: [name], MD as PCP - General (Family Medicine) [name], MD as Consulting Physician (Oncology) [name], Revonda Standard., MD as Consulting Physician (General Surgery) [facility] as Radiologist (Radiology) [facility] as Radiologist (Diagnostic Radiology)  Date of Service:  10/16/2023  CHIEF COMPLAINT: f/u of pancreatic cancer  CURRENT THERAPY:  FOLFOX every 2 weeks  Oncology History   Pancreatic cancer metastasized to liver [facility]) metastatic to liver, stage IV, KRAS G12R (+), MMR normal  -incidental finding on lung cancer screening chest CT on 01/19/22. 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  -He began first-line palliative FOLFIRINOX on 03/13/22, but unfortunately did not respond well. CA 19-9 also rose on treatment. -he switched to second line gemcitabine/abraxane on 06/05/22. He tolerates moderate well and has continued to work full time. CA 19-9 has dropped on treatment. -restaging CT from 08/24/22 showed partial response to treatment, improved primary tumor, and liver/node mets, I discussed with him  -Pt was referred to see Dr. [name] on 10/10/2022 , surgery was discussed but not felt to be a surgical candidate due to his multiple liver metastasis (5) -Restaging CT scan on November 24, 2022 showed moderate disease progression in liver and the pancreas, his tumor marker also increased.  I have changed his chemotherapy to 5-FU and liposomal irinotecan, started on 12/04/22, he is tolerating well  -Restaging scan from March 06, 2023 showed slightly decreased pancreatic mass, but he has slight progression in liver metastasis.  Has been trending up lately, also indicating a mild progression. -We discussed there is very  limited treatment option at this point, I would recommend him to try docetaxel, gemcitabine and capecitabine as next line therapy. -pt is clinically doing well, given the limited treatment options, we decided to continue current therapy until he has further disease progression. -I reviewed his Guardant360 results, which is negative for targetable mutations  -restaging CT on 05/30/2023 showed overall stable disease, but probably a tiny new liver metastatic lesion.  His tumor marker has slightly increased lately.  Given the limited treatment options, I continued same chemo -restaging CT September 17, 2023 showed disease progression in liver and abdominal adenopathy -We discussed the limited treatment options, and I recommend FOLFOX or docetaxel, gemcitabine and 5-FU. He started FOLFOX on 10/02/2023   Assessment and Plan    Metastatic Pancreatic Cancer Follow-up for metastatic pancreatic cancer. Reports extreme fatigue, dyspnea on exertion, and occasional calf cramps. Hemoglobin at 9.7, slightly decreased likely due to chemotherapy. Platelet count is good, white count slightly elevated. No fever or chills. Pending kidney and liver function tests. CA 19.9 tumor marker was high last time; will be checked every other treatment. Discussed potential dose reduction of chemotherapy to manage side effects. Advised against ivermectin due to lack of clinical evidence. Discussed potential clinical trials for KRAS G12R mutation at [facility]. Patient willing to try available treatment options. Discussed financial burden of chemotherapy. - Continue current chemotherapy regimen with slight dose adjustment - Monitor CA 19.9 tumor marker every other treatment - Schedule CT scan every three months or sooner if tumor marker does not decrease - Reach out to [facility] for potential clinical trials for KRAS G12R mutation - Consider Guardian 360 liquid biopsy to check for new mutations - Check oxygen levels  during walking to assess for  hypoxia - Encourage activity to maintain muscle mass - Avoid spicy and hard-to-digest foods - Refill lidocaine cream - Continue Creon as prescribed  Chemotherapy-Induced Fatigue Reports extreme fatigue impacting daily activities and work. Likely multifactorial, related to chemotherapy and possible recent illness. - Adjust chemotherapy dose to manage fatigue - Encourage activity to maintain muscle mass - Monitor blood counts and kidney/liver function tests  Chemotherapy-Induced Peripheral Neuropathy Reports numbness in fingers and toes, no tingling or difficulty with fine motor tasks. 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 - Monitor symptoms and consider dose reduction of chemotherapy if symptoms worsen  Constipation Bowel movements every other day, with hard stools. Recently started Colace and increased fiber intake. - Continue Colace - Increase dietary fiber intake  Gastroesophageal Reflux Disease (GERD) Experienced severe vomiting and burning sensation after eating a meal with ketchup. Reports frequent burping and occasional bloating. - Avoid acidic and spicy foods - Monitor symptoms and consider further evaluation if symptoms persist  Plan -Lab reviewed, adequate for treatment, will proceed to cycle 2 chemo today with slight dose reduction on 5-FU due to his significant fatigue - Schedule next follow-up appointment in two weeks -I will reach out to [facility] to see if he is a candidate for clinical trial      SUMMARY OF ONCOLOGIC HISTORY: Oncology History Overview Note   Cancer Staging  Pancreatic cancer metastasized to liver [facility]) Staging form: Exocrine Pancreas, AJCC 8th Edition - Clinical stage from 02/28/2022: Stage IV (cT2, cN1, pM1) - Signed by [name], MD on 03/02/2022 Stage prefix: Initial diagnosis     Pancreatic cancer metastasized to liver (HCC)  02/28/2022 Cancer Staging   Staging form: Exocrine Pancreas, AJCC 8th Edition - Clinical  stage from 02/28/2022: Stage IV (cT2, cN1, pM1) - Signed by [name], MD on 03/02/2022 Stage prefix: Initial diagnosis   03/02/2022 Initial Diagnosis   Pancreatic cancer metastasized to liver (HCC)   03/13/2022 - 04/13/2022 Chemotherapy   Patient is on Treatment Plan : PANCREAS Modified FOLFIRINOX q14d x 4 cycles     03/13/2022 - 05/17/2022 Chemotherapy   Patient is on Treatment Plan : PANCREAS Modified FOLFIRINOX q14d x 4 cycles     03/18/2022 Genetic Testing   Negative genetic testing on the CancerNext-Expanded+RNAinsight panel.  APC [id number] VUs identified.  The report date is March 18, 2022.  The CancerNext-Expanded gene panel offered by [facility] and includes sequencing and rearrangement analysis for the following 77 genes: AIP, ALK, APC*, ATM*, AXIN2, BAP1, BARD1, BLM, BMPR1A, BRCA1*, BRCA2*, BRIP1*, CDC73, CDH1*, CDK4, CDKN1B, CDKN2A, CHEK2*, CTNNA1, DICER1, FANCC, FH, FLCN, GALNT12, KIF1B, LZTR1, MAX, MEN1, MET, MLH1*, MSH2*, MSH3, MSH6*, MUTYH*, NBN, NF1*, NF2, NTHL1, PALB2*, PHOX2B, PMS2*, POT1, PRKAR1A, PTCH1, PTEN*, RAD51C*, RAD51D*, RB1, RECQL, RET, SDHA, SDHAF2, SDHB, SDHC, SDHD, SMAD4, SMARCA4, SMARCB1, SMARCE1, STK11, SUFU, TMEM127, TP53*, TSC1, TSC2, VHL and XRCC2 (sequencing and deletion/duplication); EGFR, EGLN1, HOXB13, KIT, MITF, PDGFRA, POLD1, and POLE (sequencing only); EPCAM and GREM1 (deletion/duplication only). DNA and RNA analyses performed for * genes.    05/25/2022 Progression   Rising CA 19-9, CT CAP IMPRESSION: 1. Multiple new and enlarged hypodense, rim enhancing liver metastases. 2. Unchanged, ill-defined hypoenhancing mass of the pancreatic uncinate, consistent with pancreatic adenocarcinoma 3. Unchanged small prominent portacaval and gastrohepatic ligament nodes, modestly suspicious for nodal metastases. No overtly enlarged lymph nodes in the abdomen or pelvis. 4. Prostatomegaly.    06/05/2022 - 11/13/2022 Chemotherapy   Patient is on Treatment Plan :  PANCREATIC Abraxane D1,8,15 + Gemcitabine D1,8,15 q28d     11/24/2022 Imaging   IMPRESSION: 1. Today's study demonstrates mild progression of disease as evidenced by slight enlargement of the mass in the uncinate process of the pancreas which demonstrates potential direct invasion into the superior wall of the third portion of the duodenum, but currently demonstrates no definite vascular involvement. Multiple hepatic lesions are generally stable to the prior study, with exception of enlargement of a lesion in segment 8, as detailed above. 2. No new sites of metastatic disease are noted elsewhere in the chest or pelvis. 3. Aortic atherosclerosis.  Rising CA 19-9    12/04/2022 - 09/04/2023 Chemotherapy   Patient is on Treatment Plan : PANCREAS Liposomal Irinotecan + Leucovorin + 5-FU IVCI q14d     03/06/2023 Imaging    IMPRESSION: 1. Mass in the pancreatic head demonstrates decreased size since prior study. 2. Multiple liver metastasis are mildly increased in size. No new lesions. 3. 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 Metastatic disease would be less likely. 4. Mild aortic atherosclerosis. 5. Nonspecific sclerotic foci in the pelvis, unchanged, likely benign bone islands.     10/02/2023 -  Chemotherapy   Patient is on Treatment Plan : GASTRIC FOLFOX q14d x 12 cycles        Discussed the use of AI scribe software for clinical note transcription with the patient, who gave verbal consent to proceed.  History of Present Illness   A 58 year old patient with a history of metastatic pancreatic cancer presents for a follow-up visit. He reports significant fatigue since his last chemotherapy session, which has affected his ability to work and perform daily activities. He describes feeling extremely tired and needing to sit down after short walks, such  as from his location to the front door. He denies experiencing any diarrhea or vomiting post-chemotherapy.  The patient also reports occasional forgetfulness in taking his blood thinner medication, but this is not a frequent occurrence. He denies any bleeding or swelling. He has been experiencing peripheral neuropathy, specifically numbness in his fingers and toes, but denies any difficulty in picking up objects or any tingling sensation.  The patient also mentions a recent illness, which he describes as a stomach bug. He was bedridden for four to five days, but denies having a fever. He reports that his bowel movements have been irregular, sometimes every other day, and hard. He has started taking Colace, a stool softener, to manage this.  The patient's appetite is good, and he has been maintaining three meals a day. However, he reports that certain foods, such as ketchup, cause discomfort and lead to a burning sensation in his stomach. He also mentions frequent burping.         All other systems were reviewed with the patient and are negative.  MEDICAL HISTORY:  Past Medical History:  Diagnosis Date   Family history of adverse reaction to anesthesia    mother allergic to ether    Family history of breast cancer    Family history of colon cancer    Pneumonia    hx of 2014     SURGICAL HISTORY: Past Surgical History:  Procedure Laterality Date   APPENDECTOMY     CHOLECYSTECTOMY N/A 07/12/2018   Procedure: LAPAROSCOPIC CHOLECYSTECTOMY WITH INTRAOPERATIVE CHOLANGIOGRAM;  Surgeon: Luretha Murphy, MD;  Location: [facility];  Service: General;  Laterality: N/A;   left wrist  surgery      has plate in arm    NASAL POLYP SURGERY  2017   PORTACATH PLACEMENT N/A 03/10/2022   Procedure: INSERTION PORT-A-CATH;  Surgeon: Fritzi Mandes, MD;  Location: [facility] OR;  Service: General;  Laterality: N/A;   VENTRAL HERNIA REPAIR N/A 07/15/2014   Procedure: LAPAROSCOPIC REPAIR INCARCARTED UMBILICAL  VENTRAL  HERNIA, ;  Surgeon: Claud Kelp, MD;  Location: [facility];  Service: General;  Laterality: N/A;  With MESH    I have reviewed the social history and family history with the patient and they are unchanged from previous note.  ALLERGIES:  has no known allergies.  MEDICATIONS:  Current Outpatient Medications  Medication Sig Dispense Refill   lidocaine-prilocaine (EMLA) cream Apply 1 Application topically as needed. 30 g 2   apixaban (ELIQUIS) 5 MG TABS tablet Take 1 tablet (5 mg total) by mouth 2 (two) times daily. 60 tablet 3   dexamethasone (DECADRON) 4 MG tablet Take 2 tablets (8 mg total) by mouth daily. Start the day after chemotherapy for 2 days. Take with food. 30 tablet 1   KLOR-CON M20 20 MEQ tablet TAKE 1 TAB (20 MEQ) 2X DAILY FOR 7 DAYS AND THEN TAKE 1 TAB DAILY FOR REMAINDER OF THE MONTH. 37 tablet 0   lipase/protease/amylase (CREON) 36000 UNITS CPEP capsule Take 2 capsules (72,000 Units total) by mouth 3 (three) times daily with meals. May also take 1 capsule (36,000 Units total) as needed (with snacks). 240 capsule 3   loperamide (IMODIUM A-D) 2 MG tablet Take 2 mg by mouth 4 (four) times daily as needed for diarrhea or loose stools.     ondansetron (ZOFRAN) 8 MG tablet Take 1 tablet (8 mg total) by mouth every 8 (eight) hours as needed for nausea or vomiting. Start on the third day after chemotherapy. 30 tablet 1   prochlorperazine (COMPAZINE) 10 MG tablet Take 1 tablet (10 mg total) by mouth every 6 (six) hours as needed for nausea or vomiting. 30 tablet 1   valACYclovir (VALTREX) 500 MG tablet Take 1 tablet (500 mg total) by mouth 2 (two) times daily. 60 tablet 3   No current facility-administered medications for this visit.   Facility-Administered Medications Ordered in Other Visits  Medication Dose Route Frequency Provider Last Rate Last Admin   dextrose 5 % solution   Intravenous Continuous [name], MD       dextrose 5 % solution   Intravenous Continuous [name], MD    Stopped at 10/16/23 1317   fluorouracil (ADRUCIL) 5,000 mg in sodium chloride 0.9 % 150 mL chemo infusion  2,200 mg/m2 (Order-Specific) Intravenous 1 day or 1 dose [name], MD   Infusion Verify at 10/16/23 1416   heparin lock flush 100 unit/mL  500 Units Intracatheter Once PRN [name], MD       sodium chloride flush (NS) 0.9 % injection 10 mL  10 mL Intracatheter PRN [name], MD        PHYSICAL EXAMINATION: ECOG PERFORMANCE STATUS: 2 - Symptomatic, <50% confined to bed  Vitals:   10/16/23 0852  BP: 124/84  Pulse: 68  Resp: 15  Temp: (!) 97.3 F (36.3 C)  SpO2: 100%   Wt Readings from Last 3 Encounters:  10/16/23 227 lb 11.2 oz (103.3 kg)  10/02/23 229 lb 12.8 oz (104.2 kg)  09/04/23 235 lb 3.2 oz (106.7 kg)     GENERAL:alert, no distress and comfortable SKIN: skin color, texture, turgor are normal, no rashes  or significant lesions EYES: normal, Conjunctiva are pink and non-injected, sclera clear NECK: supple, thyroid normal size, non-tender, without nodularity LYMPH:  no palpable lymphadenopathy in the cervical, axillary  LUNGS: clear to auscultation and percussion with normal breathing effort HEART: regular rate & rhythm and no murmurs and no lower extremity edema ABDOMEN:abdomen soft, non-tender and normal bowel sounds Musculoskeletal:no cyanosis of digits and no clubbing  NEURO: alert & oriented x 3 with fluent speech, no focal motor/sensory deficits   LABORATORY DATA:  I have reviewed the data as listed    Latest Ref Rng & Units 10/16/2023    8:33 AM 10/02/2023   11:19 AM 09/04/2023   10:34 AM  CBC  WBC 4.0 - 10.5 K/uL 14.4  15.9  8.0   Hemoglobin 13.0 - 17.0 g/dL 9.7  82.9  56.2   Hematocrit 39.0 - 52.0 % 29.7  32.6  31.0   Platelets 150 - 400 K/uL 331  423  279         Latest Ref Rng & Units 10/16/2023    8:33 AM 10/02/2023   11:19 AM 09/04/2023   10:34 AM  CMP  Glucose 70 - 99 mg/dL 130  865  784   BUN 6 - 20 mg/dL 10  11  9    Creatinine 0.61 - 1.24  mg/dL 6.96  2.95  2.84   Sodium 135 - 145 mmol/L 135  135  136   Potassium 3.5 - 5.1 mmol/L 4.0  4.0  3.2   Chloride 98 - 111 mmol/L 102  102  103   CO2 22 - 32 mmol/L 26  26  26    Calcium 8.9 - 10.3 mg/dL 8.5  9.0  8.8   Total Protein 6.5 - 8.1 g/dL 6.6  6.9  6.1   Total Bilirubin 0.0 - 1.2 mg/dL 0.5  0.5  0.5   Alkaline Phos 38 - 126 U/L 302  140  85   AST 15 - 41 U/L 38  20  15   ALT 0 - 44 U/L 50  19  17       RADIOGRAPHIC STUDIES: I have personally reviewed the radiological images as listed and agreed with the findings in the report. No results found.    No orders of the defined types were placed in this encounter.  All questions were answered. The patient knows to call the clinic with any problems, questions or concerns. No barriers to learning was detected. The total time spent in the appointment was 25 minutes.     [name], MD 10/16/2023

## 2023-10-16 NOTE — Telephone Encounter (Signed)
 Notified Patient of prior authorization approval for Lidocaine-Prilocaine 2.5% Cream (EMLA). Medication is approved through 01/14/2024. No other needs or concerns noted at this time.

## 2023-10-16 NOTE — Assessment & Plan Note (Signed)
 metastatic to liver, stage IV, KRAS G12R (+), MMR normal  -incidental finding on lung cancer screening chest CT on 01/19/22. 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  -He began first-line palliative FOLFIRINOX on 03/13/22, but unfortunately did not respond well. CA 19-9 also rose on treatment. -he switched to second line gemcitabine/abraxane on 06/05/22. He tolerates moderate well and has continued to work full time. CA 19-9 has dropped on treatment. -restaging CT from 08/24/22 showed partial response to treatment, improved primary tumor, and liver/node mets, I discussed with him  -Pt was referred to see Dr. [name] on 10/10/2022 , surgery was discussed but not felt to be a surgical candidate due to his multiple liver metastasis (5) -Restaging CT scan on November 24, 2022 showed moderate disease progression in liver and the pancreas, his tumor marker also increased.  I have changed his chemotherapy to 5-FU and liposomal irinotecan, started on 12/04/22, he is tolerating well  -Restaging scan from March 06, 2023 showed slightly decreased pancreatic mass, but he has slight progression in liver metastasis.  Has been trending up lately, also indicating a mild progression. -We discussed there is very limited treatment option at this point, I would recommend him to try docetaxel, gemcitabine and capecitabine as next line therapy. -pt is clinically doing well, given the limited treatment options, we decided to continue current therapy until he has further disease progression. -I reviewed his Guardant360 results, which is negative for targetable mutations  -restaging CT on 05/30/2023 showed overall stable disease, but probably a tiny new liver metastatic lesion.  His tumor marker has slightly increased lately.  Given the limited treatment options, I continued same chemo -restaging CT September 17, 2023 showed disease  progression in liver and abdominal adenopathy -We discussed the limited treatment options, and I recommend FOLFOX or docetaxel, gemcitabine and 5-FU. He started FOLFOX on 10/02/2023

## 2023-10-18 ENCOUNTER — Inpatient Hospital Stay: Payer: BC Managed Care – PPO

## 2023-10-18 VITALS — BP 107/74 | HR 89 | Temp 98.8°F | Resp 17

## 2023-10-18 DIAGNOSIS — C250 Malignant neoplasm of head of pancreas: Secondary | ICD-10-CM | POA: Diagnosis not present

## 2023-10-18 DIAGNOSIS — G620 Drug-induced polyneuropathy: Secondary | ICD-10-CM | POA: Diagnosis not present

## 2023-10-18 DIAGNOSIS — Z452 Encounter for adjustment and management of vascular access device: Secondary | ICD-10-CM | POA: Diagnosis not present

## 2023-10-18 DIAGNOSIS — C787 Secondary malignant neoplasm of liver and intrahepatic bile duct: Secondary | ICD-10-CM | POA: Diagnosis not present

## 2023-10-18 DIAGNOSIS — R5383 Other fatigue: Secondary | ICD-10-CM | POA: Diagnosis not present

## 2023-10-18 DIAGNOSIS — K5900 Constipation, unspecified: Secondary | ICD-10-CM | POA: Diagnosis not present

## 2023-10-18 DIAGNOSIS — K219 Gastro-esophageal reflux disease without esophagitis: Secondary | ICD-10-CM | POA: Diagnosis not present

## 2023-10-18 DIAGNOSIS — Z95828 Presence of other vascular implants and grafts: Secondary | ICD-10-CM

## 2023-10-18 DIAGNOSIS — Z5111 Encounter for antineoplastic chemotherapy: Secondary | ICD-10-CM | POA: Diagnosis not present

## 2023-10-18 MED ORDER — SODIUM CHLORIDE 0.9% FLUSH
10.0000 mL | Freq: Once | INTRAVENOUS | Status: AC
  Administered 2023-10-18: 10 mL

## 2023-10-18 MED ORDER — HEPARIN SOD (PORK) LOCK FLUSH 100 UNIT/ML IV SOLN
500.0000 [IU] | Freq: Once | INTRAVENOUS | Status: AC
Start: 2023-10-18 — End: 2023-10-18
  Administered 2023-10-18: 500 [IU]

## 2023-10-30 ENCOUNTER — Other Ambulatory Visit: Payer: Self-pay

## 2023-10-30 ENCOUNTER — Encounter: Payer: Self-pay | Admitting: Hematology

## 2023-10-30 ENCOUNTER — Inpatient Hospital Stay: Payer: BC Managed Care – PPO | Attending: Hematology

## 2023-10-30 ENCOUNTER — Inpatient Hospital Stay: Payer: BC Managed Care – PPO

## 2023-10-30 ENCOUNTER — Inpatient Hospital Stay: Payer: BC Managed Care – PPO | Attending: Hematology | Admitting: Hematology

## 2023-10-30 VITALS — BP 103/79 | HR 92 | Temp 98.5°F | Resp 18

## 2023-10-30 DIAGNOSIS — C259 Malignant neoplasm of pancreas, unspecified: Secondary | ICD-10-CM

## 2023-10-30 DIAGNOSIS — Z5111 Encounter for antineoplastic chemotherapy: Secondary | ICD-10-CM | POA: Insufficient documentation

## 2023-10-30 DIAGNOSIS — C787 Secondary malignant neoplasm of liver and intrahepatic bile duct: Secondary | ICD-10-CM

## 2023-10-30 DIAGNOSIS — Z95828 Presence of other vascular implants and grafts: Secondary | ICD-10-CM

## 2023-10-30 DIAGNOSIS — C250 Malignant neoplasm of head of pancreas: Secondary | ICD-10-CM | POA: Insufficient documentation

## 2023-10-30 LAB — CBC WITH DIFFERENTIAL (CANCER CENTER ONLY)
Abs Immature Granulocytes: 0.07 10*3/uL (ref 0.00–0.07)
Basophils Absolute: 0.1 10*3/uL (ref 0.0–0.1)
Basophils Relative: 1 %
Eosinophils Absolute: 1.5 10*3/uL — ABNORMAL HIGH (ref 0.0–0.5)
Eosinophils Relative: 15 %
HCT: 28.9 % — ABNORMAL LOW (ref 39.0–52.0)
Hemoglobin: 9.1 g/dL — ABNORMAL LOW (ref 13.0–17.0)
Immature Granulocytes: 1 %
Lymphocytes Relative: 22 %
Lymphs Abs: 2.2 10*3/uL (ref 0.7–4.0)
MCH: 25.6 pg — ABNORMAL LOW (ref 26.0–34.0)
MCHC: 31.5 g/dL (ref 30.0–36.0)
MCV: 81.4 fL (ref 80.0–100.0)
Monocytes Absolute: 2.1 10*3/uL — ABNORMAL HIGH (ref 0.1–1.0)
Monocytes Relative: 21 %
Neutro Abs: 4.2 10*3/uL (ref 1.7–7.7)
Neutrophils Relative %: 40 %
Platelet Count: 421 10*3/uL — ABNORMAL HIGH (ref 150–400)
RBC: 3.55 MIL/uL — ABNORMAL LOW (ref 4.22–5.81)
RDW: 16.8 % — ABNORMAL HIGH (ref 11.5–15.5)
WBC Count: 10.2 10*3/uL (ref 4.0–10.5)
nRBC: 0 % (ref 0.0–0.2)

## 2023-10-30 LAB — CMP (CANCER CENTER ONLY)
ALT: 33 U/L (ref 0–44)
AST: 21 U/L (ref 15–41)
Albumin: 3.2 g/dL — ABNORMAL LOW (ref 3.5–5.0)
Alkaline Phosphatase: 374 U/L — ABNORMAL HIGH (ref 38–126)
Anion gap: 8 (ref 5–15)
BUN: 9 mg/dL (ref 6–20)
CO2: 26 mmol/L (ref 22–32)
Calcium: 8.1 mg/dL — ABNORMAL LOW (ref 8.9–10.3)
Chloride: 98 mmol/L (ref 98–111)
Creatinine: 0.72 mg/dL (ref 0.61–1.24)
GFR, Estimated: >60 mL/min (ref >60)
Glucose, Bld: 123 mg/dL — ABNORMAL HIGH (ref 70–99)
Potassium: 4.2 mmol/L (ref 3.5–5.1)
Sodium: 132 mmol/L — ABNORMAL LOW (ref 135–145)
Total Bilirubin: 0.5 mg/dL (ref 0.0–1.2)
Total Protein: 6.4 g/dL — ABNORMAL LOW (ref 6.5–8.1)

## 2023-10-30 MED ORDER — SODIUM CHLORIDE 0.9% FLUSH
10.0000 mL | Freq: Once | INTRAVENOUS | Status: AC
  Administered 2023-10-30: 10 mL

## 2023-10-30 MED ORDER — SODIUM CHLORIDE 0.9 % IV SOLN
1800.0000 mg/m2 | INTRAVENOUS | Status: DC
  Administered 2023-10-30: 3950 mg via INTRAVENOUS
  Filled 2023-10-30: qty 79

## 2023-10-30 MED ORDER — DEXTROSE 5 % IV SOLN: INTRAVENOUS | Status: DC

## 2023-10-30 MED ORDER — FAMOTIDINE IN NACL 20-0.9 MG/50ML-% IV SOLN
20.0000 mg | Freq: Once | INTRAVENOUS | Status: AC
  Administered 2023-10-30: 20 mg via INTRAVENOUS
  Filled 2023-10-30: qty 50

## 2023-10-30 MED ORDER — POTASSIUM CHLORIDE CRYS ER 20 MEQ PO TBCR: 20.0000 meq | EXTENDED_RELEASE_TABLET | Freq: Two times a day (BID) | ORAL | 1 refills | Status: DC

## 2023-10-30 MED ORDER — PALONOSETRON HCL INJECTION 0.25 MG/5ML
0.2500 mg | Freq: Once | INTRAVENOUS | Status: AC
  Administered 2023-10-30: 0.25 mg via INTRAVENOUS
  Filled 2023-10-30: qty 5

## 2023-10-30 MED ORDER — LEUCOVORIN CALCIUM INJECTION 350 MG
400.0000 mg/m2 | Freq: Once | INTRAVENOUS | Status: AC
  Administered 2023-10-30: 880 mg via INTRAVENOUS
  Filled 2023-10-30: qty 44

## 2023-10-30 MED ORDER — DEXAMETHASONE SODIUM PHOSPHATE 10 MG/ML IJ SOLN
10.0000 mg | Freq: Once | INTRAMUSCULAR | Status: AC
  Administered 2023-10-30: 10 mg via INTRAVENOUS
  Filled 2023-10-30: qty 1

## 2023-10-30 MED ORDER — OXALIPLATIN CHEMO INJECTION 100 MG/20ML
50.0000 mg/m2 | Freq: Once | INTRAVENOUS | Status: AC
  Administered 2023-10-30: 100 mg via INTRAVENOUS
  Filled 2023-10-30: qty 3.74

## 2023-10-30 MED ORDER — LORATADINE 10 MG PO TABS
10.0000 mg | ORAL_TABLET | Freq: Once | ORAL | Status: AC
Start: 2023-10-30 — End: 2023-10-30
  Administered 2023-10-30: 10 mg via ORAL
  Filled 2023-10-30: qty 1

## 2023-10-30 NOTE — Assessment & Plan Note (Signed)
 metastatic to liver, stage IV, KRAS G12R (+), MMR normal  -incidental finding on lung cancer screening chest CT on 01/19/22. 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  -He began first-line palliative FOLFIRINOX on 03/13/22, but unfortunately did not respond well. CA 19-9 also rose on treatment. -he switched to second line gemcitabine/abraxane on 06/05/22. He tolerates moderate well and has continued to work full time. CA 19-9 has dropped on treatment. -restaging CT from 08/24/22 showed partial response to treatment, improved primary tumor, and liver/node mets, I discussed with him  -Pt was referred to see Dr. [name] on 10/10/2022 , surgery was discussed but not felt to be a surgical candidate due to his multiple liver metastasis (5) -Restaging CT scan on November 24, 2022 showed moderate disease progression in liver and the pancreas, his tumor marker also increased.  I have changed his chemotherapy to 5-FU and liposomal irinotecan, started on 12/04/22, he is tolerating well  -Restaging scan from March 06, 2023 showed slightly decreased pancreatic mass, but he has slight progression in liver metastasis.  Has been trending up lately, also indicating a mild progression. -We discussed there is very limited treatment option at this point, I would recommend him to try docetaxel, gemcitabine and capecitabine as next line therapy. -pt is clinically doing well, given the limited treatment options, we decided to continue current therapy until he has further disease progression. -I reviewed his Guardant360 results, which is negative for targetable mutations  -restaging CT on 05/30/2023 showed overall stable disease, but probably a tiny new liver metastatic lesion.  His tumor marker has slightly increased lately.  Given the limited treatment options, I continued same chemo -restaging CT September 17, 2023 showed disease  progression in liver and abdominal adenopathy -We discussed the limited treatment options, and I recommend FOLFOX or docetaxel, gemcitabine and 5-FU. He started FOLFOX on 10/02/2023

## 2023-10-30 NOTE — Patient Instructions (Signed)
 [facility] - A DEPT OF [facility]  Discharge Instructions: Thank you for choosing [facility] to provide your oncology and hematology care.   If you have a lab appointment with the Cancer Center, please go directly to the Cancer Center and check in at the registration area.   Wear comfortable clothing and clothing appropriate for easy access to any Portacath or PICC line.   We strive to give you quality time with your provider. You may need to reschedule your appointment if you arrive late (15 or more minutes).  Arriving late affects you and other patients whose appointments are after yours.  Also, if you miss three or more appointments without notifying the office, you may be dismissed from the clinic at the provider's discretion.      For prescription refill requests, have your pharmacy contact our office and allow 72 hours for refills to be completed.    Today you received the following chemotherapy and/or immunotherapy agents Oxaliplatin, Leukovorin, 5FU      To help prevent nausea and vomiting after your treatment, we encourage you to take your nausea medication as directed.  BELOW ARE SYMPTOMS THAT SHOULD BE REPORTED IMMEDIATELY: *FEVER GREATER THAN 100.4 F (38 C) OR HIGHER *CHILLS OR SWEATING *NAUSEA AND VOMITING THAT IS NOT CONTROLLED WITH YOUR NAUSEA MEDICATION *UNUSUAL SHORTNESS OF BREATH *UNUSUAL BRUISING OR BLEEDING *URINARY PROBLEMS (pain or burning when urinating, or frequent urination) *BOWEL PROBLEMS (unusual diarrhea, constipation, pain near the anus) TENDERNESS IN MOUTH AND THROAT WITH OR WITHOUT PRESENCE OF ULCERS (sore throat, sores in mouth, or a toothache) UNUSUAL RASH, SWELLING OR PAIN  UNUSUAL VAGINAL DISCHARGE OR ITCHING   Items with * indicate a potential emergency and should be followed up as soon as possible or go to the Emergency Department if any problems should occur.  Please show the CHEMOTHERAPY ALERT CARD  or IMMUNOTHERAPY ALERT CARD at check-in to the Emergency Department and triage nurse.  Should you have questions after your visit or need to cancel or reschedule your appointment, please contact [facility] - A DEPT OF [facility]  Dept: [phone]  and follow the prompts.  Office hours are 8:00 a.m. to 4:30 p.m. Monday - Friday. Please note that voicemails left after 4:00 p.m. may not be returned until the following business day.  We are closed weekends and major holidays. You have access to a nurse at all times for urgent questions. Please call the main number to the clinic Dept: [phone] and follow the prompts.   For any non-urgent questions, you may also contact your provider using MyChart. We now offer e-Visits for anyone 76 and older to request care online for non-urgent symptoms. For details visit [email or web address].   Also download the MyChart app! Go to the app store, search "MyChart", open the app, select [place], and log in with your MyChart username and password.

## 2023-10-30 NOTE — Progress Notes (Signed)
 [facility]   Telephone:([phone] Fax:([phone]) [phone]   Clinic Follow up Note   Patient Care Team: [name], MD as PCP - General (Family Medicine) [name], MD as Consulting Physician (Oncology) [name], Revonda Standard., MD as Consulting Physician (General Surgery) [facility] as Radiologist (Radiology) [facility] as Radiologist (Diagnostic Radiology)  Date of Service:  10/30/2023  CHIEF COMPLAINT: f/u of pancreatic cancer   CURRENT THERAPY:  FOLFOX   Oncology History   Pancreatic cancer metastasized to liver [facility]) metastatic to liver, stage IV, KRAS G12R (+), MMR normal  -incidental finding on lung cancer screening chest CT on 01/19/22. 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  -He began first-line palliative FOLFIRINOX on 03/13/22, but unfortunately did not respond well. CA 19-9 also rose on treatment. -he switched to second line gemcitabine/abraxane on 06/05/22. He tolerates moderate well and has continued to work full time. CA 19-9 has dropped on treatment. -restaging CT from 08/24/22 showed partial response to treatment, improved primary tumor, and liver/node mets, I discussed with him  -Pt was referred to see Dr. [name] on 10/10/2022 , surgery was discussed but not felt to be a surgical candidate due to his multiple liver metastasis (5) -Restaging CT scan on November 24, 2022 showed moderate disease progression in liver and the pancreas, his tumor marker also increased.  I have changed his chemotherapy to 5-FU and liposomal irinotecan, started on 12/04/22, he is tolerating well  -Restaging scan from March 06, 2023 showed slightly decreased pancreatic mass, but he has slight progression in liver metastasis.  Has been trending up lately, also indicating a mild progression. -We discussed there is very limited  treatment option at this point, I would recommend him to try docetaxel, gemcitabine and capecitabine as next line therapy. -pt is clinically doing well, given the limited treatment options, we decided to continue current therapy until he has further disease progression. -I reviewed his Guardant360 results, which is negative for targetable mutations  -restaging CT on 05/30/2023 showed overall stable disease, but probably a tiny new liver metastatic lesion.  His tumor marker has slightly increased lately.  Given the limited treatment options, I continued same chemo -restaging CT September 17, 2023 showed disease progression in liver and abdominal adenopathy -We discussed the limited treatment options, and I recommend FOLFOX or docetaxel, gemcitabine and 5-FU. He started FOLFOX on 10/02/2023    Assessment and Plan    Metastatic pancreatic cancer He is experiencing increased fatigue, likely related to chemotherapy. Reports no nausea or diarrhea but significant weakness and decreased appetite, possibly due to gas and bloating. Has lost three pounds. Neuropathy and cold sensitivity have worsened. Current chemotherapy regimen was started recently, and there is concern about cancer progression. However, he believes the fatigue is due to chemotherapy rather than cancer progression. Last scan was done on January 27, and a repeat scan is planned to assess the effectiveness of the current regimen. Hemoglobin levels have decreased from 10.5 to 9.1. - Reduce chemotherapy dose significantly to improve energy levels - Order repeat scan in two weeks to assess treatment effectiveness - Consider changing chemotherapy regimen if current treatment is ineffective - Discuss potential use of steroids to improve appetite and energy - Ensure potassium levels are adequate - Monitor blood counts, especially hemoglobin levels  Fatigue  -started after cycle 1 FOLFOX -will again reduce his chemo  dose -repeating staging CT to  rule out cancer progression   Plan -lab reviewed, will proceed with C3 FOFLOX today with further dose reduction due to fatigue  Postpone next cycle chemo for a week -CT in 2 weeks with a virtual visit after CT    SUMMARY OF ONCOLOGIC HISTORY: Oncology History Overview Note   Cancer Staging  Pancreatic cancer metastasized to liver [facility]) Staging form: Exocrine Pancreas, AJCC 8th Edition - Clinical stage from 02/28/2022: Stage IV (cT2, cN1, pM1) - Signed by [name], MD on 03/02/2022 Stage prefix: Initial diagnosis     Pancreatic cancer metastasized to liver (HCC)  02/28/2022 Cancer Staging   Staging form: Exocrine Pancreas, AJCC 8th Edition - Clinical stage from 02/28/2022: Stage IV (cT2, cN1, pM1) - Signed by [name], MD on 03/02/2022 Stage prefix: Initial diagnosis   03/02/2022 Initial Diagnosis   Pancreatic cancer metastasized to liver (HCC)   03/13/2022 - 04/13/2022 Chemotherapy   Patient is on Treatment Plan : PANCREAS Modified FOLFIRINOX q14d x 4 cycles     03/13/2022 - 05/17/2022 Chemotherapy   Patient is on Treatment Plan : PANCREAS Modified FOLFIRINOX q14d x 4 cycles     03/18/2022 Genetic Testing   Negative genetic testing on the CancerNext-Expanded+RNAinsight panel.  APC c.[id number] VUs identified.  The report date is March 18, 2022.  The CancerNext-Expanded gene panel offered by [facility] and includes sequencing and rearrangement analysis for the following 77 genes: AIP, ALK, APC*, ATM*, AXIN2, BAP1, BARD1, BLM, BMPR1A, BRCA1*, BRCA2*, BRIP1*, CDC73, CDH1*, CDK4, CDKN1B, CDKN2A, CHEK2*, CTNNA1, DICER1, FANCC, FH, FLCN, GALNT12, KIF1B, LZTR1, MAX, MEN1, MET, MLH1*, MSH2*, MSH3, MSH6*, MUTYH*, NBN, NF1*, NF2, NTHL1, PALB2*, PHOX2B, PMS2*, POT1, PRKAR1A, PTCH1, PTEN*, RAD51C*, RAD51D*, RB1, RECQL, RET, SDHA, SDHAF2, SDHB, SDHC, SDHD, SMAD4, SMARCA4, SMARCB1, SMARCE1, STK11, SUFU, TMEM127, TP53*, TSC1, TSC2, VHL and XRCC2 (sequencing and deletion/duplication); EGFR, EGLN1, HOXB13,  KIT, MITF, PDGFRA, POLD1, and POLE (sequencing only); EPCAM and GREM1 (deletion/duplication only). DNA and RNA analyses performed for * genes.    05/25/2022 Progression   Rising CA 19-9, CT CAP IMPRESSION: 1. Multiple new and enlarged hypodense, rim enhancing liver metastases. 2. Unchanged, ill-defined hypoenhancing mass of the pancreatic uncinate, consistent with pancreatic adenocarcinoma 3. Unchanged small prominent portacaval and gastrohepatic ligament nodes, modestly suspicious for nodal metastases. No overtly enlarged lymph nodes in the abdomen or pelvis. 4. Prostatomegaly.    06/05/2022 - 11/13/2022 Chemotherapy   Patient is on Treatment Plan : PANCREATIC Abraxane D1,8,15 + Gemcitabine D1,8,15 q28d     11/24/2022 Imaging   IMPRESSION: 1. Today's study demonstrates mild progression of disease as evidenced by slight enlargement of the mass in the uncinate process of the pancreas which demonstrates potential direct invasion into the superior wall of the third portion of the duodenum, but currently demonstrates no definite vascular involvement. Multiple hepatic lesions are generally stable to the prior study, with exception of enlargement of a lesion in segment 8, as detailed above. 2. No new sites of metastatic disease are noted elsewhere in the chest or pelvis. 3. Aortic atherosclerosis.  Rising CA 19-9    12/04/2022 - 09/04/2023 Chemotherapy   Patient is on Treatment Plan : PANCREAS Liposomal Irinotecan + Leucovorin + 5-FU IVCI q14d     03/06/2023 Imaging    IMPRESSION: 1. Mass in the pancreatic head demonstrates decreased size since prior study. 2. Multiple liver metastasis are mildly increased in size. No new lesions. 3. 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 Metastatic disease would be less likely. 4. Mild aortic atherosclerosis. 5.  Nonspecific sclerotic foci in the pelvis, unchanged, likely benign bone islands.     10/02/2023 -  Chemotherapy   Patient is on Treatment Plan : GASTRIC FOLFOX q14d x 12 cycles        Discussed the use of AI scribe software for clinical note transcription with the patient, who gave verbal consent to proceed.  History of Present Illness   A 58 year old patient with a history of metastatic pancreatic cancer presents for a follow-up visit. The patient is currently on chemotherapy and reports worsening fatigue since the last visit. The fatigue is so severe that it limits the patient's daily activities, including work. The patient describes the fatigue as constant and more severe than the previous visit. Despite the fatigue, the patient denies experiencing nausea or diarrhea. The patient also reports feeling weak, to the point of needing to sit down to urinate and rest after walking short distances.  In addition to fatigue and weakness, the patient has been experiencing significant gas, which has affected his appetite and led to a weight loss of about three pounds. The patient also reports some cold sensitivity and neuropathy, particularly in the fingers. Despite these symptoms, the patient denies any pain.         All other systems were reviewed with the patient and are negative.  MEDICAL HISTORY:  Past Medical History:  Diagnosis Date   Family history of adverse reaction to anesthesia    mother allergic to ether    Family history of breast cancer    Family history of colon cancer    Pneumonia    hx of 2014     SURGICAL HISTORY: Past Surgical History:  Procedure Laterality Date   APPENDECTOMY     CHOLECYSTECTOMY N/A 07/12/2018   Procedure: LAPAROSCOPIC CHOLECYSTECTOMY WITH INTRAOPERATIVE CHOLANGIOGRAM;  Surgeon: Luretha Murphy, MD;  Location: [facility];  Service: General;  Laterality: N/A;   left wrist surgery      has plate in arm    NASAL POLYP SURGERY  2017   PORTACATH PLACEMENT  N/A 03/10/2022   Procedure: INSERTION PORT-A-CATH;  Surgeon: Fritzi Mandes, MD;  Location: [facility] OR;  Service: General;  Laterality: N/A;   VENTRAL HERNIA REPAIR N/A 07/15/2014   Procedure: LAPAROSCOPIC REPAIR INCARCARTED UMBILICAL  VENTRAL HERNIA, ;  Surgeon: Claud Kelp, MD;  Location: [facility];  Service: General;  Laterality: N/A;  With MESH    I have reviewed the social history and family history with the patient and they are unchanged from previous note.  ALLERGIES:  has no known allergies.  MEDICATIONS:  Current Outpatient Medications  Medication Sig Dispense Refill   apixaban (ELIQUIS) 5 MG TABS tablet Take 1 tablet (5 mg total) by mouth 2 (two) times daily. 60 tablet 3   dexamethasone (DECADRON) 4 MG tablet Take 2 tablets (8 mg total) by mouth daily. Start the day after chemotherapy for 2 days. Take with food. 30 tablet 1   lidocaine-prilocaine (EMLA) cream Apply 1 Application topically as needed. 30 g 2   lipase/protease/amylase (CREON) 36000 UNITS CPEP capsule Take 2 capsules (72,000 Units total) by mouth 3 (three) times daily with meals. May also take 1 capsule (36,000 Units total) as needed (with snacks). 240 capsule 3   loperamide (IMODIUM A-D) 2 MG tablet Take 2 mg by mouth 4 (four) times daily as needed for diarrhea or loose stools.  ondansetron (ZOFRAN) 8 MG tablet Take 1 tablet (8 mg total) by mouth every 8 (eight) hours as needed for nausea or vomiting. Start on the third day after chemotherapy. 30 tablet 1   potassium chloride SA (KLOR-CON M20) 20 MEQ tablet Take 1 tablet (20 mEq total) by mouth 2 (two) times daily. 60 tablet 1   prochlorperazine (COMPAZINE) 10 MG tablet Take 1 tablet (10 mg total) by mouth every 6 (six) hours as needed for nausea or vomiting. 30 tablet 1   valACYclovir (VALTREX) 500 MG tablet Take 1 tablet (500 mg total) by mouth 2 (two) times daily. 60 tablet 3   No current facility-administered medications for this visit.   Facility-Administered  Medications Ordered in Other Visits  Medication Dose Route Frequency Provider Last Rate Last Admin   dextrose 5 % solution   Intravenous Continuous [name], MD       dextrose 5 % solution   Intravenous Continuous [name], MD   Stopped at 10/30/23 1713   fluorouracil (ADRUCIL) 3,950 mg in sodium chloride 0.9 % 71 mL chemo infusion  1,800 mg/m2 (Order-Specific) Intravenous 1 day or 1 dose [name], MD   Infusion Verify at 10/30/23 1720    PHYSICAL EXAMINATION: ECOG PERFORMANCE STATUS: 2 - Symptomatic, <50% confined to bed  There were no vitals filed for this visit. Wt Readings from Last 3 Encounters:  10/16/23 227 lb 11.2 oz (103.3 kg)  10/02/23 229 lb 12.8 oz (104.2 kg)  09/04/23 235 lb 3.2 oz (106.7 kg)     GENERAL:alert, no distress and comfortable SKIN: skin color, texture, turgor are normal, no rashes or significant lesions EYES: normal, Conjunctiva are pink and non-injected, sclera clear NECK: supple, thyroid normal size, non-tender, without nodularity LYMPH:  no palpable lymphadenopathy in the cervical, axillary  LUNGS: clear to auscultation and percussion with normal breathing effort HEART: regular rate & rhythm and no murmurs and no lower extremity edema ABDOMEN:abdomen soft, non-tender and normal bowel sounds Musculoskeletal:no cyanosis of digits and no clubbing  NEURO: alert & oriented x 3 with fluent speech, no focal motor/sensory deficits  Physical Exam          LABORATORY DATA:  I have reviewed the data as listed    Latest Ref Rng & Units 10/30/2023    1:16 PM 10/16/2023    8:33 AM 10/02/2023   11:19 AM  CBC  WBC 4.0 - 10.5 K/uL 10.2  14.4  15.9   Hemoglobin 13.0 - 17.0 g/dL 9.1  9.7  16.1   Hematocrit 39.0 - 52.0 % 28.9  29.7  32.6   Platelets 150 - 400 K/uL 421  331  423         Latest Ref Rng & Units 10/30/2023    1:16 PM 10/16/2023    8:33 AM 10/02/2023   11:19 AM  CMP  Glucose 70 - 99 mg/dL 096  045  409   BUN 6 - 20 mg/dL 9  10  11    Creatinine  0.61 - 1.24 mg/dL 8.11  9.14  7.82   Sodium 135 - 145 mmol/L 132  135  135   Potassium 3.5 - 5.1 mmol/L 4.2  4.0  4.0   Chloride 98 - 111 mmol/L 98  102  102   CO2 22 - 32 mmol/L 26  26  26    Calcium 8.9 - 10.3 mg/dL 8.1  8.5  9.0   Total Protein 6.5 - 8.1 g/dL 6.4  6.6  6.9   Total  Bilirubin 0.0 - 1.2 mg/dL 0.5  0.5  0.5   Alkaline Phos 38 - 126 U/L 374  302  140   AST 15 - 41 U/L 21  38  20   ALT 0 - 44 U/L 33  50  19       RADIOGRAPHIC STUDIES: I have personally reviewed the radiological images as listed and agreed with the findings in the report. No results found.    No orders of the defined types were placed in this encounter.  All questions were answered. The patient knows to call the clinic with any problems, questions or concerns. No barriers to learning was detected. The total time spent in the appointment was 25 minutes.     [name], MD 10/30/2023

## 2023-10-31 ENCOUNTER — Other Ambulatory Visit: Payer: Self-pay

## 2023-10-31 LAB — CANCER ANTIGEN 19-9: CA 19-9: 673 U/mL — ABNORMAL HIGH (ref 0–35)

## 2023-11-01 ENCOUNTER — Inpatient Hospital Stay: Payer: BC Managed Care – PPO

## 2023-11-01 ENCOUNTER — Other Ambulatory Visit: Payer: Self-pay

## 2023-11-01 DIAGNOSIS — Z95828 Presence of other vascular implants and grafts: Secondary | ICD-10-CM

## 2023-11-01 DIAGNOSIS — C250 Malignant neoplasm of head of pancreas: Secondary | ICD-10-CM | POA: Diagnosis not present

## 2023-11-01 DIAGNOSIS — C787 Secondary malignant neoplasm of liver and intrahepatic bile duct: Secondary | ICD-10-CM | POA: Diagnosis not present

## 2023-11-01 DIAGNOSIS — Z5111 Encounter for antineoplastic chemotherapy: Secondary | ICD-10-CM | POA: Diagnosis not present

## 2023-11-01 MED ORDER — SODIUM CHLORIDE 0.9% FLUSH
10.0000 mL | Freq: Once | INTRAVENOUS | Status: AC
Start: 2023-11-01 — End: 2023-11-01
  Administered 2023-11-01: 10 mL

## 2023-11-01 MED ORDER — HEPARIN SOD (PORK) LOCK FLUSH 100 UNIT/ML IV SOLN: 500.0000 [IU] | Freq: Once | INTRAVENOUS | Status: DC | PRN

## 2023-11-01 MED ORDER — SODIUM CHLORIDE 0.9% FLUSH: 10.0000 mL | INTRAVENOUS | Status: DC | PRN

## 2023-11-01 MED ORDER — HEPARIN SOD (PORK) LOCK FLUSH 100 UNIT/ML IV SOLN
500.0000 [IU] | Freq: Once | INTRAVENOUS | Status: AC
Start: 2023-11-01 — End: 2023-11-01
  Administered 2023-11-01: 500 [IU]

## 2023-11-02 ENCOUNTER — Other Ambulatory Visit: Payer: Self-pay

## 2023-11-02 ENCOUNTER — Telehealth: Payer: Self-pay | Admitting: Hematology

## 2023-11-02 NOTE — Telephone Encounter (Signed)
 Per staff message on 11/02/2023 patient is aware of scheduled appointment for port flush with labs on 11/08/23; patietn has callabck number if needing to reschedule or cancel appointment

## 2023-11-02 NOTE — Addendum Note (Signed)
 Addended by: [name] on: 11/02/2023 10:27 AM   Modules accepted: Orders

## 2023-11-07 ENCOUNTER — Other Ambulatory Visit: Payer: Self-pay

## 2023-11-07 DIAGNOSIS — C259 Malignant neoplasm of pancreas, unspecified: Secondary | ICD-10-CM

## 2023-11-08 ENCOUNTER — Inpatient Hospital Stay

## 2023-11-13 ENCOUNTER — Ambulatory Visit: Payer: BC Managed Care – PPO

## 2023-11-13 ENCOUNTER — Ambulatory Visit: Payer: BC Managed Care – PPO | Admitting: Nurse Practitioner

## 2023-11-13 ENCOUNTER — Other Ambulatory Visit: Payer: BC Managed Care – PPO

## 2023-11-15 ENCOUNTER — Ambulatory Visit (HOSPITAL_COMMUNITY)
Admission: RE | Admit: 2023-11-15 | Discharge: 2023-11-15 | Disposition: A | Discharge status: Home / Self Care | Source: Ambulatory Visit | Attending: Hematology | Admitting: Hematology

## 2023-11-15 ENCOUNTER — Emergency Department (HOSPITAL_COMMUNITY)

## 2023-11-15 ENCOUNTER — Inpatient Hospital Stay

## 2023-11-15 ENCOUNTER — Encounter (HOSPITAL_COMMUNITY): Payer: Self-pay | Admitting: Emergency Medicine

## 2023-11-15 ENCOUNTER — Other Ambulatory Visit: Payer: Self-pay

## 2023-11-15 ENCOUNTER — Ambulatory Visit: Admitting: Hematology

## 2023-11-15 ENCOUNTER — Emergency Department (HOSPITAL_COMMUNITY)
Admission: EM | Admit: 2023-11-15 | Discharge: 2023-11-15 | Disposition: A | Discharge status: Home / Self Care | Attending: Emergency Medicine | Admitting: Emergency Medicine

## 2023-11-15 ENCOUNTER — Telehealth: Payer: Self-pay

## 2023-11-15 ENCOUNTER — Other Ambulatory Visit: Payer: Self-pay | Admitting: Hematology

## 2023-11-15 DIAGNOSIS — C259 Malignant neoplasm of pancreas, unspecified: Secondary | ICD-10-CM

## 2023-11-15 DIAGNOSIS — M1611 Unilateral primary osteoarthritis, right hip: Secondary | ICD-10-CM | POA: Diagnosis not present

## 2023-11-15 DIAGNOSIS — R0602 Shortness of breath: Secondary | ICD-10-CM | POA: Insufficient documentation

## 2023-11-15 DIAGNOSIS — I700 Atherosclerosis of aorta: Secondary | ICD-10-CM | POA: Insufficient documentation

## 2023-11-15 DIAGNOSIS — R918 Other nonspecific abnormal finding of lung field: Secondary | ICD-10-CM | POA: Diagnosis not present

## 2023-11-15 DIAGNOSIS — Z8505 Personal history of malignant neoplasm of liver: Secondary | ICD-10-CM | POA: Insufficient documentation

## 2023-11-15 DIAGNOSIS — R202 Paresthesia of skin: Secondary | ICD-10-CM | POA: Diagnosis not present

## 2023-11-15 DIAGNOSIS — C787 Secondary malignant neoplasm of liver and intrahepatic bile duct: Secondary | ICD-10-CM | POA: Insufficient documentation

## 2023-11-15 DIAGNOSIS — M25551 Pain in right hip: Secondary | ICD-10-CM | POA: Insufficient documentation

## 2023-11-15 DIAGNOSIS — R600 Localized edema: Secondary | ICD-10-CM | POA: Diagnosis not present

## 2023-11-15 DIAGNOSIS — Z7901 Long term (current) use of anticoagulants: Secondary | ICD-10-CM | POA: Diagnosis not present

## 2023-11-15 DIAGNOSIS — R200 Anesthesia of skin: Secondary | ICD-10-CM | POA: Diagnosis not present

## 2023-11-15 DIAGNOSIS — Z8507 Personal history of malignant neoplasm of pancreas: Secondary | ICD-10-CM | POA: Insufficient documentation

## 2023-11-15 DIAGNOSIS — Z95828 Presence of other vascular implants and grafts: Secondary | ICD-10-CM

## 2023-11-15 DIAGNOSIS — M79604 Pain in right leg: Secondary | ICD-10-CM | POA: Diagnosis not present

## 2023-11-15 DIAGNOSIS — C786 Secondary malignant neoplasm of retroperitoneum and peritoneum: Secondary | ICD-10-CM | POA: Diagnosis not present

## 2023-11-15 LAB — CBC WITH DIFFERENTIAL (CANCER CENTER ONLY)
Abs Immature Granulocytes: 0.15 10*3/uL — ABNORMAL HIGH (ref 0.00–0.07)
Basophils Absolute: 0.1 10*3/uL (ref 0.0–0.1)
Basophils Relative: 1 %
Eosinophils Absolute: 3.7 10*3/uL — ABNORMAL HIGH (ref 0.0–0.5)
Eosinophils Relative: 23 %
HCT: 28.8 % — ABNORMAL LOW (ref 39.0–52.0)
Hemoglobin: 9 g/dL — ABNORMAL LOW (ref 13.0–17.0)
Immature Granulocytes: 1 %
Lymphocytes Relative: 12 %
Lymphs Abs: 2 10*3/uL (ref 0.7–4.0)
MCH: 25.3 pg — ABNORMAL LOW (ref 26.0–34.0)
MCHC: 31.3 g/dL (ref 30.0–36.0)
MCV: 80.9 fL (ref 80.0–100.0)
Monocytes Absolute: 3 10*3/uL — ABNORMAL HIGH (ref 0.1–1.0)
Monocytes Relative: 18 %
Neutro Abs: 7.2 10*3/uL (ref 1.7–7.7)
Neutrophils Relative %: 45 %
Platelet Count: 419 10*3/uL — ABNORMAL HIGH (ref 150–400)
RBC: 3.56 MIL/uL — ABNORMAL LOW (ref 4.22–5.81)
RDW: 18.7 % — ABNORMAL HIGH (ref 11.5–15.5)
WBC Count: 16.1 10*3/uL — ABNORMAL HIGH (ref 4.0–10.5)
nRBC: 0 % (ref 0.0–0.2)

## 2023-11-15 LAB — CMP (CANCER CENTER ONLY)
ALT: 26 U/L (ref 0–44)
AST: 19 U/L (ref 15–41)
Albumin: 3 g/dL — ABNORMAL LOW (ref 3.5–5.0)
Alkaline Phosphatase: 403 U/L — ABNORMAL HIGH (ref 38–126)
Anion gap: 9 (ref 5–15)
BUN: 8 mg/dL (ref 6–20)
CO2: 24 mmol/L (ref 22–32)
Calcium: 8.4 mg/dL — ABNORMAL LOW (ref 8.9–10.3)
Chloride: 98 mmol/L (ref 98–111)
Creatinine: 0.63 mg/dL (ref 0.61–1.24)
GFR, Estimated: >60 mL/min (ref >60)
Glucose, Bld: 105 mg/dL — ABNORMAL HIGH (ref 70–99)
Potassium: 4 mmol/L (ref 3.5–5.1)
Sodium: 131 mmol/L — ABNORMAL LOW (ref 135–145)
Total Bilirubin: 0.8 mg/dL (ref 0.0–1.2)
Total Protein: 6.5 g/dL (ref 6.5–8.1)

## 2023-11-15 MED ORDER — SODIUM CHLORIDE 0.9% FLUSH
10.0000 mL | Freq: Once | INTRAVENOUS | Status: AC
  Administered 2023-11-15: 10 mL

## 2023-11-15 MED ORDER — ACETAMINOPHEN 500 MG PO TABS
1000.0000 mg | ORAL_TABLET | Freq: Once | ORAL | Status: DC
Start: 2023-11-15 — End: 2023-11-15

## 2023-11-15 MED ORDER — HEPARIN SOD (PORK) LOCK FLUSH 100 UNIT/ML IV SOLN
500.0000 [IU] | Freq: Once | INTRAVENOUS | Status: AC
  Administered 2023-11-15: 500 [IU]
  Filled 2023-11-15: qty 5

## 2023-11-15 MED ORDER — OXYCODONE HCL 5 MG PO TABS: 5.0000 mg | ORAL_TABLET | Freq: Once | ORAL | Status: DC

## 2023-11-15 MED ORDER — IOHEXOL 350 MG/ML SOLN
100.0000 mL | Freq: Once | INTRAVENOUS | Status: AC | PRN
  Administered 2023-11-15: 100 mL via INTRAVENOUS

## 2023-11-15 MED ORDER — HEPARIN SOD (PORK) LOCK FLUSH 100 UNIT/ML IV SOLN: 500.0000 [IU] | Freq: Once | INTRAVENOUS | Status: DC
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
## 2023-11-15 NOTE — Progress Notes (Signed)
 As per Dr. Mosetta Putt, order was place for Guardant 360 for this patient, paperwork was taken to lab to be drawn by the lab today. Paperwork given to [employer].

## 2023-11-15 NOTE — Progress Notes (Signed)
 Pt arrived late for lab appt in flush room. After accessing port, not enough blood return gotten for blood samples. Lab appt made. Tubes and Guardant kit given to pt to take with him. Pt arrived via wheelchair. Left accessed with power needle for CT. Flushes well with no issues. CT called and made aware, updated about above information.

## 2023-11-15 NOTE — ED Provider Notes (Signed)
 Received patient in signout from previous provider pending imaging results and completion of ED workup.  See his note.  In short, patient presents to emergency department for evaluation of shortness of breath and lower extremity weakness.  He endorses shortness of breath has been going on for 6 weeks but has has gotten noticeably worse in the last week.  He has had lower extremity paresthesia typically after sitting for long periods of time from mid thigh down. 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 Followed by Dr. Mosetta Putt at [facility] cancer center.  Sent by cancer center today for evaluation of SHOB and bilateral leg numbness. He had CT PE and CT abdomen pelvis with contrast completed today.  Hip XR does not show any acute abnormality. CT PE negative for PE.  Notes 3 new/increased solid noncalcified nodules in the bilateral lungs concerning for metastasis.  No acute osseous abnormalities to suggest mets to spine on CT.  Patient is neurologically intact with no sensation nor motor deficit noted on exam. No TTP of cervical, thoracic, nor lumbar spine. Based on patient's story and reassuring workup, I do not have strong suspicion for spinal mets at this time. Sounds like numbness occurs following sitting and placing pressure on posterior thighs.  Shared decision making is had with patient regarding obtaining MRI imaging of spine to ensure no mets.  At this time, patient does not wish to have additional imaging of spine. I think this is reasonable.  Fortunately ruled out PE as cause of SHOB. It is likely due to chemo, decreased activity since starting new chemo, and new/increased nodules in lungs. Ambulates today without difficulty nor O2 supplementation.   Dr. Mosetta Putt, oncology, individually assessed patient and recommends for follow up in 2-3 weeks.  Discussed ED workup, disposition, return to ED  precautions with patient who expresses understanding agrees with plan.  All questions answered to their satisfaction.  They are agreeable to plan.  Discharge instructions provided on paperwork   Judithann Sheen, PA 11/15/23 1740    Jacalyn Lefevre, MD 11/15/23 Zollie Pee

## 2023-11-15 NOTE — ED Notes (Signed)
 Meal tray delivered.

## 2023-11-15 NOTE — ED Triage Notes (Signed)
 Pt sent over from cancer center for weakness and numbness in bilateral legs. Complaints of shortness of breath as well for 6 weeks it has gotten worse in the last week. Numbness in bilateral legs is when patient is sitting up straight for long periods of time.

## 2023-11-15 NOTE — Telephone Encounter (Signed)
 Patient had labs and CT scan scheduled for today. Lab appt completed. Received telephone call from Radiology with a message for Dr. Mosetta Putt from Mrs. Scrogham. Wife requesting to speak to Dr. Mosetta Putt stating patient waiting to have ct scan and is c/o of a lot of pain, difficulty breathing, and numbness in both legs.  Added patient to Dr. Latanya Maudlin schedule for patient to be seen after the ct scan. Transferred the call (wife) to Dr. Mosetta Putt. Dr. Mosetta Putt advised to take the patient to [facility] ED for further evaluation.  Canceled [facility] appt with Dr. Mosetta Putt.

## 2023-11-15 NOTE — Discharge Instructions (Addendum)
 Thank you for letting us evaluate you today.  Your CT shows 3 new nodules in bilateral lungs.  Otherwise is mostly unremarkable.  We do not see any obvious bone abnormalities of your spine on the CT.  Your hip x-ray was negative for any acute bony abnormalities.  Please follow-up with your oncologist Dr. Parke Poisson for further management

## 2023-11-15 NOTE — ED Notes (Signed)
Patient able to ambulate without difficulty.

## 2023-11-16 ENCOUNTER — Telehealth: Payer: Self-pay | Admitting: Hematology

## 2023-11-16 LAB — CANCER ANTIGEN 19-9: CA 19-9: 912 U/mL — ABNORMAL HIGH (ref 0–35)

## 2023-11-16 NOTE — Telephone Encounter (Signed)
 Scheduled appointments per 3/28 staff message. Talked with the patient and she is aware of the made appointments.

## 2023-11-17 ENCOUNTER — Other Ambulatory Visit: Payer: Self-pay

## 2023-11-20 ENCOUNTER — Other Ambulatory Visit: Payer: Self-pay

## 2023-11-20 ENCOUNTER — Inpatient Hospital Stay: Admitting: Nurse Practitioner

## 2023-11-20 ENCOUNTER — Inpatient Hospital Stay

## 2023-11-20 ENCOUNTER — Inpatient Hospital Stay: Attending: Hematology

## 2023-11-20 DIAGNOSIS — C259 Malignant neoplasm of pancreas, unspecified: Secondary | ICD-10-CM

## 2023-11-20 NOTE — Progress Notes (Signed)
 Placed order for PT in home as per Dr. Mosetta Putt. Sent order to [facility]. Spoke with Carma Lair she stated she would pull the order and set up with the patient a time to come out.

## 2023-11-21 ENCOUNTER — Encounter: Payer: Self-pay | Admitting: Hematology

## 2023-11-21 ENCOUNTER — Encounter: Payer: Self-pay | Admitting: Physician Assistant

## 2023-11-21 DIAGNOSIS — M25551 Pain in right hip: Secondary | ICD-10-CM | POA: Diagnosis not present

## 2023-11-21 NOTE — Progress Notes (Signed)
 Opened in error

## 2023-11-22 ENCOUNTER — Inpatient Hospital Stay

## 2023-11-22 ENCOUNTER — Encounter: Payer: Self-pay | Admitting: Hematology

## 2023-11-22 DIAGNOSIS — C259 Malignant neoplasm of pancreas, unspecified: Secondary | ICD-10-CM | POA: Diagnosis not present

## 2023-11-23 LAB — GUARDANT 360

## 2023-11-24 DIAGNOSIS — Z79899 Other long term (current) drug therapy: Secondary | ICD-10-CM | POA: Diagnosis not present

## 2023-11-24 DIAGNOSIS — Z556 Problems related to health literacy: Secondary | ICD-10-CM | POA: Diagnosis not present

## 2023-11-24 DIAGNOSIS — Z8701 Personal history of pneumonia (recurrent): Secondary | ICD-10-CM | POA: Diagnosis not present

## 2023-11-24 DIAGNOSIS — Z7952 Long term (current) use of systemic steroids: Secondary | ICD-10-CM | POA: Diagnosis not present

## 2023-11-24 DIAGNOSIS — G629 Polyneuropathy, unspecified: Secondary | ICD-10-CM | POA: Diagnosis not present

## 2023-11-24 DIAGNOSIS — Z604 Social exclusion and rejection: Secondary | ICD-10-CM | POA: Diagnosis not present

## 2023-11-24 DIAGNOSIS — C259 Malignant neoplasm of pancreas, unspecified: Secondary | ICD-10-CM | POA: Diagnosis not present

## 2023-11-24 DIAGNOSIS — Z7901 Long term (current) use of anticoagulants: Secondary | ICD-10-CM | POA: Diagnosis not present

## 2023-11-24 DIAGNOSIS — C787 Secondary malignant neoplasm of liver and intrahepatic bile duct: Secondary | ICD-10-CM | POA: Diagnosis not present

## 2023-11-26 ENCOUNTER — Other Ambulatory Visit: Payer: Self-pay

## 2023-11-26 ENCOUNTER — Emergency Department (HOSPITAL_COMMUNITY)

## 2023-11-26 ENCOUNTER — Observation Stay (HOSPITAL_COMMUNITY)
Admission: EM | Admit: 2023-11-26 | Discharge: 2023-11-29 | Disposition: A | Discharge status: Hospice | Attending: Family Medicine | Admitting: Family Medicine

## 2023-11-26 ENCOUNTER — Encounter (HOSPITAL_COMMUNITY): Payer: Self-pay | Admitting: Student

## 2023-11-26 ENCOUNTER — Telehealth: Payer: Self-pay

## 2023-11-26 DIAGNOSIS — K219 Gastro-esophageal reflux disease without esophagitis: Secondary | ICD-10-CM | POA: Insufficient documentation

## 2023-11-26 DIAGNOSIS — R112 Nausea with vomiting, unspecified: Secondary | ICD-10-CM

## 2023-11-26 DIAGNOSIS — R7401 Elevation of levels of liver transaminase levels: Secondary | ICD-10-CM | POA: Diagnosis not present

## 2023-11-26 DIAGNOSIS — E440 Moderate protein-calorie malnutrition: Secondary | ICD-10-CM | POA: Insufficient documentation

## 2023-11-26 DIAGNOSIS — R627 Adult failure to thrive: Secondary | ICD-10-CM | POA: Insufficient documentation

## 2023-11-26 DIAGNOSIS — R601 Generalized edema: Secondary | ICD-10-CM | POA: Insufficient documentation

## 2023-11-26 DIAGNOSIS — D72829 Elevated white blood cell count, unspecified: Secondary | ICD-10-CM | POA: Diagnosis not present

## 2023-11-26 DIAGNOSIS — Z7901 Long term (current) use of anticoagulants: Secondary | ICD-10-CM | POA: Insufficient documentation

## 2023-11-26 DIAGNOSIS — R1110 Vomiting, unspecified: Secondary | ICD-10-CM | POA: Diagnosis present

## 2023-11-26 DIAGNOSIS — C259 Malignant neoplasm of pancreas, unspecified: Principal | ICD-10-CM | POA: Insufficient documentation

## 2023-11-26 DIAGNOSIS — D638 Anemia in other chronic diseases classified elsewhere: Secondary | ICD-10-CM | POA: Insufficient documentation

## 2023-11-26 DIAGNOSIS — C7889 Secondary malignant neoplasm of other digestive organs: Secondary | ICD-10-CM | POA: Diagnosis not present

## 2023-11-26 DIAGNOSIS — I959 Hypotension, unspecified: Secondary | ICD-10-CM | POA: Diagnosis not present

## 2023-11-26 DIAGNOSIS — C772 Secondary and unspecified malignant neoplasm of intra-abdominal lymph nodes: Secondary | ICD-10-CM | POA: Diagnosis not present

## 2023-11-26 DIAGNOSIS — R609 Edema, unspecified: Secondary | ICD-10-CM | POA: Diagnosis not present

## 2023-11-26 DIAGNOSIS — Z87891 Personal history of nicotine dependence: Secondary | ICD-10-CM | POA: Insufficient documentation

## 2023-11-26 DIAGNOSIS — E871 Hypo-osmolality and hyponatremia: Principal | ICD-10-CM | POA: Insufficient documentation

## 2023-11-26 DIAGNOSIS — R770 Abnormality of albumin: Secondary | ICD-10-CM | POA: Diagnosis not present

## 2023-11-26 DIAGNOSIS — R0689 Other abnormalities of breathing: Secondary | ICD-10-CM | POA: Diagnosis not present

## 2023-11-26 DIAGNOSIS — C770 Secondary and unspecified malignant neoplasm of lymph nodes of head, face and neck: Secondary | ICD-10-CM | POA: Insufficient documentation

## 2023-11-26 DIAGNOSIS — C787 Secondary malignant neoplasm of liver and intrahepatic bile duct: Secondary | ICD-10-CM | POA: Diagnosis not present

## 2023-11-26 DIAGNOSIS — Z7189 Other specified counseling: Secondary | ICD-10-CM | POA: Insufficient documentation

## 2023-11-26 DIAGNOSIS — R9431 Abnormal electrocardiogram [ECG] [EKG]: Secondary | ICD-10-CM | POA: Diagnosis not present

## 2023-11-26 DIAGNOSIS — C786 Secondary malignant neoplasm of retroperitoneum and peritoneum: Secondary | ICD-10-CM | POA: Insufficient documentation

## 2023-11-26 LAB — COMPREHENSIVE METABOLIC PANEL WITH GFR
ALT: 24 U/L (ref 0–44)
AST: 29 U/L (ref 15–41)
Albumin: 1.9 g/dL — ABNORMAL LOW (ref 3.5–5.0)
Alkaline Phosphatase: 382 U/L — ABNORMAL HIGH (ref 38–126)
Anion gap: 11 (ref 5–15)
BUN: 15 mg/dL (ref 6–20)
CO2: 20 mmol/L — ABNORMAL LOW (ref 22–32)
Calcium: 7.6 mg/dL — ABNORMAL LOW (ref 8.9–10.3)
Chloride: 96 mmol/L — ABNORMAL LOW (ref 98–111)
Creatinine, Ser: 0.94 mg/dL (ref 0.61–1.24)
GFR, Estimated: >60 mL/min (ref >60)
Glucose, Bld: 115 mg/dL — ABNORMAL HIGH (ref 70–99)
Potassium: 4.2 mmol/L (ref 3.5–5.1)
Sodium: 127 mmol/L — ABNORMAL LOW (ref 135–145)
Total Bilirubin: 1.9 mg/dL — ABNORMAL HIGH (ref 0.0–1.2)
Total Protein: 5.9 g/dL — ABNORMAL LOW (ref 6.5–8.1)

## 2023-11-26 LAB — CBC
HCT: 27.8 % — ABNORMAL LOW (ref 39.0–52.0)
Hemoglobin: 8.4 g/dL — ABNORMAL LOW (ref 13.0–17.0)
MCH: 24.9 pg — ABNORMAL LOW (ref 26.0–34.0)
MCHC: 30.2 g/dL (ref 30.0–36.0)
MCV: 82.2 fL (ref 80.0–100.0)
Platelets: 325 10*3/uL (ref 150–400)
RBC: 3.38 MIL/uL — ABNORMAL LOW (ref 4.22–5.81)
RDW: 20.5 % — ABNORMAL HIGH (ref 11.5–15.5)
WBC: 48.7 10*3/uL — ABNORMAL HIGH (ref 4.0–10.5)
nRBC: 0 % (ref 0.0–0.2)

## 2023-11-26 LAB — URINALYSIS, ROUTINE W REFLEX MICROSCOPIC
Bilirubin Urine: NEGATIVE
Glucose, UA: NEGATIVE mg/dL
Hgb urine dipstick: NEGATIVE
Ketones, ur: NEGATIVE mg/dL
Leukocytes,Ua: NEGATIVE
Nitrite: NEGATIVE
Protein, ur: NEGATIVE mg/dL
Specific Gravity, Urine: 1.021 (ref 1.005–1.030)
pH: 5 (ref 5.0–8.0)

## 2023-11-26 LAB — LIPASE, BLOOD: Lipase: 155 U/L — ABNORMAL HIGH (ref 11–51)

## 2023-11-26 MED ORDER — OXYCODONE HCL 5 MG PO TABS
5.0000 mg | ORAL_TABLET | Freq: Four times a day (QID) | ORAL | Status: DC | PRN
  Administered 2023-11-26 – 2023-11-28 (×5): 5 mg via ORAL
  Filled 2023-11-26 (×5): qty 1

## 2023-11-26 MED ORDER — PANTOPRAZOLE SODIUM 40 MG PO TBEC
40.0000 mg | DELAYED_RELEASE_TABLET | Freq: Two times a day (BID) | ORAL | Status: DC
  Administered 2023-11-26 – 2023-11-29 (×6): 40 mg via ORAL
  Filled 2023-11-26 (×6): qty 1

## 2023-11-26 MED ORDER — ONDANSETRON HCL 4 MG/2ML IJ SOLN
4.0000 mg | Freq: Once | INTRAMUSCULAR | Status: AC
  Administered 2023-11-26: 4 mg via INTRAVENOUS
  Filled 2023-11-26: qty 2

## 2023-11-26 MED ORDER — APIXABAN 5 MG PO TABS
5.0000 mg | ORAL_TABLET | Freq: Two times a day (BID) | ORAL | Status: DC
  Administered 2023-11-26 – 2023-11-29 (×6): 5 mg via ORAL
  Filled 2023-11-26 (×4): qty 1
  Filled 2023-11-26: qty 2
  Filled 2023-11-26: qty 1

## 2023-11-26 MED ORDER — ACETAMINOPHEN 325 MG PO TABS
650.0000 mg | ORAL_TABLET | Freq: Four times a day (QID) | ORAL | Status: DC | PRN
  Administered 2023-11-26 – 2023-11-29 (×3): 650 mg via ORAL
  Filled 2023-11-26 (×3): qty 2

## 2023-11-26 MED ORDER — PANCRELIPASE (LIP-PROT-AMYL) 36000-114000 UNITS PO CPEP
72000.0000 [IU] | ORAL_CAPSULE | Freq: Three times a day (TID) | ORAL | Status: DC
  Administered 2023-11-27 – 2023-11-29 (×8): 72000 [IU] via ORAL
  Filled 2023-11-26 (×9): qty 2

## 2023-11-26 MED ORDER — ONDANSETRON HCL 4 MG PO TABS
4.0000 mg | ORAL_TABLET | Freq: Four times a day (QID) | ORAL | Status: DC | PRN
  Administered 2023-11-29: 4 mg via ORAL
  Filled 2023-11-26: qty 1

## 2023-11-26 MED ORDER — IOHEXOL 300 MG/ML  SOLN
100.0000 mL | Freq: Once | INTRAMUSCULAR | Status: AC | PRN
  Administered 2023-11-26: 100 mL via INTRAVENOUS

## 2023-11-26 MED ORDER — LACTATED RINGERS IV BOLUS
1000.0000 mL | Freq: Once | INTRAVENOUS | Status: AC
  Administered 2023-11-26: 1000 mL via INTRAVENOUS

## 2023-11-26 MED ORDER — ONDANSETRON HCL 4 MG/2ML IJ SOLN: 4.0000 mg | Freq: Four times a day (QID) | INTRAMUSCULAR | Status: DC | PRN

## 2023-11-26 MED ORDER — SODIUM CHLORIDE 0.9 % IV BOLUS: 500.0000 mL | Freq: Once | INTRAVENOUS | Status: DC

## 2023-11-26 MED ORDER — ACETAMINOPHEN 650 MG RE SUPP: 650.0000 mg | Freq: Four times a day (QID) | RECTAL | Status: DC | PRN

## 2023-11-26 MED ORDER — PANCRELIPASE (LIP-PROT-AMYL) 36000-114000 UNITS PO CPEP: 36000.0000 [IU] | ORAL_CAPSULE | ORAL | Status: DC | PRN

## 2023-11-26 NOTE — ED Triage Notes (Signed)
 Pt BIBA from home for emesis, everything comes back up about 4-6 hours after eating. Having some decreased urine output and fluid retention in abdomen. Feeling weaker as well. Concerned for growth of pancreatic mass.  97/75 baseline 96% 2L  + orthos 18 rr HR 90 Cbg 118

## 2023-11-26 NOTE — H&P (Signed)
 History and Physical    Patient: Steve Mills WUJ:[id number] DOB: 1965-12-20 DOA: 11/26/2023 DOS: the patient was seen and examined on 11/26/2023 PCP: [facility] [place]  Patient coming from: Home  Chief Complaint:  Chief Complaint  Patient presents with   Emesis   Bloated   HPI: Steve Mills is a 58 y.o. male with medical history significant of pancreatic cancer with metastasis to liver, chronic anemia, obesity, s/p cholecystectomy (2019), obesity presenting to the ED with decreased PO intake and vomiting.   Patient with known pancreatic cancer with metastases to liver. He was on chemotherapy until about a month ago when he began experiencing significant side effects to the point where it began impacting his functionality. He was unable to go to work and has been deteriorating over the past few weeks. Has noted worsening over the last week and has not been able to eat much. States he will have 3 grapes and a little bit of water and immediately feel full. He has also been experiencing an episode of NBNB vomiting once a day. States he does not feel much nausea but will have abrupt feeling of needing to vomit prior to vomiting. He does also endorse symptoms of acid reflux at home. Has been using OTC prilosec prn with partial relief. He does note decreased urination over the past week in conjunction with decreased PO intake. Denies any fevers, chills, chest pain, palpitations, cough, abdominal pain.  He is hungry right now. Would like to try eating something like jello.  ED course: Vital signs stable aside from low BP (ranging in 90s-100s/70s). CBC with profound leukocytosis to 48.7, hemoglobin 8.4 (baseline around 9-10). CMP with hyponatremia to 127, glucose 115, Cr 0.94 (baseline 0.7-0.8), albumin 1.9, alk phos 382 (chronic), T bili 1.9. Lipase 155. Urinalysis negative for infection. EKG with sinus rhythm and PVCs. CT abd/pel without acute obstruction, does redemonstrate  known pancreatic head/uncinate cancer with extensive hepatic metastases, peritoneal carcinomatosis, and metastatic lymphadenopathy (all similar to recent prior study). CT imaging also showing small-to-moderate ascites, increased since prior study. Lastly, CT showing solid noncalcified left lower lobe lung nodule concerning for metastases (similar to prior study). Oncology consulted by EDP. TRH asked to evaluate patient for admission.   Review of Systems: As mentioned in the history of present illness. All other systems reviewed and are negative. Past Medical History:  Diagnosis Date   Family history of adverse reaction to anesthesia    mother allergic to ether    Family history of breast cancer    Family history of colon cancer    Pneumonia    hx of 2014    Past Surgical History:  Procedure Laterality Date   APPENDECTOMY     CHOLECYSTECTOMY N/A 07/12/2018   Procedure: LAPAROSCOPIC CHOLECYSTECTOMY WITH INTRAOPERATIVE CHOLANGIOGRAM;  Surgeon: Luretha Murphy, MD;  Location: [facility] ORS;  Service: General;  Laterality: N/A;   left wrist surgery      has plate in arm    NASAL POLYP SURGERY  2017   PORTACATH PLACEMENT N/A 03/10/2022   Procedure: INSERTION PORT-A-CATH;  Surgeon: Fritzi Mandes, MD;  Location: [facility] OR;  Service: General;  Laterality: N/A;   VENTRAL HERNIA REPAIR N/A 07/15/2014   Procedure: LAPAROSCOPIC REPAIR INCARCARTED UMBILICAL  VENTRAL HERNIA, ;  Surgeon: Claud Kelp, MD;  Location: [facility] ORS;  Service: General;  Laterality: N/A;  With MESH   Social History:  reports that he quit smoking about 10 years ago. His smoking use included cigarettes.  He started smoking about 30 years ago. He has a 50 pack-year smoking history. He has never used smokeless tobacco. He reports that he does not drink alcohol and does not use drugs.  No Known Allergies  Family History  Problem Relation Age of Onset   Lung cancer Mother 78   Breast cancer Mother        dx in her 13s or possibly 63s    Lung cancer Father 27   Breast cancer Maternal Aunt    Brain cancer Maternal Uncle    Lung cancer Paternal Aunt    Liver cancer Paternal Uncle    Colon cancer Maternal Grandfather    Cervical cancer Niece 68       cervical v uterine    Prior to Admission medications   Medication Sig Start Date End Date Taking? Authorizing Provider  apixaban (ELIQUIS) 5 MG TABS tablet Take 1 tablet (5 mg total) by mouth 2 (two) times daily. 06/18/23   [name], MD  dexamethasone (DECADRON) 4 MG tablet Take 2 tablets (8 mg total) by mouth daily. Start the day after chemotherapy for 2 days. Take with food. 10/01/23   [name], MD  lidocaine-prilocaine (EMLA) cream Apply 1 Application topically as needed. 10/16/23   [name], MD  lipase/protease/amylase (CREON) 36000 UNITS CPEP capsule Take 2 capsules (72,000 Units total) by mouth 3 (three) times daily with meals. May also take 1 capsule (36,000 Units total) as needed (with snacks). 04/10/23   Pollyann Samples, NP  loperamide (IMODIUM A-D) 2 MG tablet Take 2 mg by mouth 4 (four) times daily as needed for diarrhea or loose stools.    [provider]  ondansetron (ZOFRAN) 8 MG tablet Take 1 tablet (8 mg total) by mouth every 8 (eight) hours as needed for nausea or vomiting. Start on the third day after chemotherapy. 10/01/23   [name], MD  oxyCODONE (OXY IR/ROXICODONE) 5 MG immediate release tablet Take 5 mg by mouth. 11/23/23   [provider]  potassium chloride SA (KLOR-CON M20) 20 MEQ tablet Take 1 tablet (20 mEq total) by mouth 2 (two) times daily. 10/30/23   [name], MD  prochlorperazine (COMPAZINE) 10 MG tablet Take 1 tablet (10 mg total) by mouth every 6 (six) hours as needed for nausea or vomiting. 10/01/23   [name], MD  valACYclovir (VALTREX) 500 MG tablet Take 1 tablet (500 mg total) by mouth 2 (two) times daily. 02/21/23   Loa Socks, NP    Physical Exam: Vitals:   11/26/23 1159 11/26/23 1500  BP: 106/69 102/63  Pulse:  89 89  Resp: 16 17  Temp: 97.9 F (36.6 C)   TempSrc: Oral   SpO2: 97% 98%   Physical Exam Constitutional:      Appearance: He is obese.     Comments: Chronically ill appearing  HENT:     Head: Normocephalic and atraumatic.     Mouth/Throat:     Mouth: Mucous membranes are dry.     Pharynx: Oropharynx is clear. No oropharyngeal exudate.     Comments: Sublingual jaundice noted. Eyes:     General: Scleral icterus present.     Extraocular Movements: Extraocular movements intact.     Pupils: Pupils are equal, round, and reactive to light.  Cardiovascular:     Rate and Rhythm: Normal rate and regular rhythm.     Heart sounds: Normal heart sounds. No murmur heard.    No friction rub. No gallop.  Pulmonary:  Effort: Pulmonary effort is normal. No respiratory distress.     Breath sounds: Normal breath sounds. No wheezing, rhonchi or rales.  Abdominal:     General: Bowel sounds are normal. There is distension.     Palpations: Abdomen is soft.     Tenderness: There is no abdominal tenderness. There is no guarding or rebound.     Comments: Shifting dullness noted on exam.  Musculoskeletal:        General: Normal range of motion.     Cervical back: Normal range of motion.     Comments: No peripheral edema noted on exam.  Skin:    General: Skin is warm and dry.     Coloration: Skin is jaundiced.  Neurological:     General: No focal deficit present.     Mental Status: He is alert and oriented to person, place, and time.  Psychiatric:        Mood and Affect: Mood normal.        Behavior: Behavior normal.     Data Reviewed:  There are no new results to review at this time.    Latest Ref Rng & Units 11/26/2023   12:55 PM 11/15/2023   11:55 AM 10/30/2023    1:16 PM  CBC  WBC 4.0 - 10.5 K/uL 48.7  16.1  10.2   Hemoglobin 13.0 - 17.0 g/dL 8.4  9.0  9.1   Hematocrit 39.0 - 52.0 % 27.8  28.8  28.9   Platelets 150 - 400 K/uL 325  419  421       Latest Ref Rng & Units 11/26/2023    12:55 PM 11/15/2023   11:55 AM 10/30/2023    1:16 PM  CMP  Glucose 70 - 99 mg/dL 409  811  914   BUN 6 - 20 mg/dL 15  8  9    Creatinine 0.61 - 1.24 mg/dL 7.82  9.56  2.13   Sodium 135 - 145 mmol/L 127  131  132   Potassium 3.5 - 5.1 mmol/L 4.2  4.0  4.2   Chloride 98 - 111 mmol/L 96  98  98   CO2 22 - 32 mmol/L 20  24  26    Calcium 8.9 - 10.3 mg/dL 7.6  8.4  8.1   Total Protein 6.5 - 8.1 g/dL 5.9  6.5  6.4   Total Bilirubin 0.0 - 1.2 mg/dL 1.9  0.8  0.5   Alkaline Phos 38 - 126 U/L 382  403  374   AST 15 - 41 U/L 29  19  21    ALT 0 - 44 U/L 24  26  33    Lipase     Component Value Date/Time   LIPASE 155 (H) 11/26/2023 1255   Urinalysis    Component Value Date/Time   COLORURINE AMBER (A) 11/26/2023 1416   APPEARANCEUR CLEAR 11/26/2023 1416   LABSPEC 1.021 11/26/2023 1416   PHURINE 5.0 11/26/2023 1416   GLUCOSEU NEGATIVE 11/26/2023 1416   HGBUR NEGATIVE 11/26/2023 1416   BILIRUBINUR NEGATIVE 11/26/2023 1416   KETONESUR NEGATIVE 11/26/2023 1416   PROTEINUR NEGATIVE 11/26/2023 1416   UROBILINOGEN 1.0 07/09/2014 1050   NITRITE NEGATIVE 11/26/2023 1416   LEUKOCYTESUR NEGATIVE 11/26/2023 1416    CT ABDOMEN PELVIS W CONTRAST Result Date: 11/26/2023 CLINICAL DATA:  Bowel obstruction suspected. Emesis. History of pancreatic cancer. * Tracking Code: BO * EXAM: CT ABDOMEN AND PELVIS WITH CONTRAST TECHNIQUE: Multidetector CT imaging of the abdomen and pelvis was performed using the  standard protocol following bolus administration of intravenous contrast. RADIATION DOSE REDUCTION: This exam was performed according to the departmental dose-optimization program which includes automated exposure control, adjustment of the mA and/or kV according to patient size and/or use of iterative reconstruction technique. CONTRAST:  OMNIPAQUE IOHEXOL 300 MG/ML  SOLN COMPARISON:  CT scan abdomen and pelvis from 11/15/2023. FINDINGS: Lower chest: There is a 3 x 4 mm solid noncalcified nodule in the  left lung lower lobe (series 6, image 29), grossly similar to the prior study but concerning for metastases. There are patchy atelectatic changes in the visualized lung bases. No overt consolidation. No pleural effusion. The heart is normal in size. No pericardial effusion. Hepatobiliary: The liver is normal in size. Non-cirrhotic configuration. Redemonstration of extensive ill-defined hypoattenuating masses throughout the liver, compatible with metastasis. When compared to the recent prior exam, no significant interval change. No intrahepatic bile duct dilation. There is mild prominence of the extrahepatic bile duct, most likely due to post cholecystectomy status. Gallbladder is surgically absent. Pancreas: Redemonstration of patient's known heterogeneous hypoattenuating mass centered in the pancreatic head/uncinate process region, grossly similar to the prior study. Spleen: Within normal limits. No focal lesion. Adrenals/Urinary Tract: Adrenal glands are unremarkable. No suspicious renal mass. Multiple bilateral renal sinus cysts again noted. No hydronephrosis. No renal or ureteric calculi. Unremarkable urinary bladder. Stomach/Bowel: No disproportionate dilation of the small or large bowel loops. No evidence of abnormal bowel wall thickening or inflammatory changes. The appendix was not visualized; however there is no acute inflammatory process in the right lower quadrant. Vascular/Lymphatic: There is small-to-moderate ascites, increased since the prior study. No pneumoperitoneum. Redemonstration of extensive enlarged/heterogeneous lymphadenopathy with largest in the portacaval region measuring up to 3.4 x 5.1 cm, grossly similar to the prior study. Redemonstration of irregular soft tissue attenuation nodules mainly in the right abdomen, compatible with peritoneal carcinomatosis no significant interval change since the prior study. No aneurysmal dilation of the major abdominal arteries. There are mild peripheral  atherosclerotic vascular calcifications of the aorta and its major branches. Reproductive: Normal size prostate. Symmetric seminal vesicles. Other: There are fat containing umbilical and bilateral inguinal hernias. The soft tissues and abdominal wall are otherwise unremarkable. Musculoskeletal: Stable subcentimeter sized sclerotic foci in the left ischium. There are mild multilevel degenerative changes in the visualized spine. IMPRESSION: 1. No bowel obstruction. 2. Redemonstration of patient's known pancreatic head/uncinate process mass, extensive hepatic metastases, peritoneal carcinomatosis and metastatic lymphadenopathy, as described in detail above. No significant interval change since the recent prior study. 3. Small-to-moderate ascites, increased since the prior study. 4. There is a 3 x 4 mm solid noncalcified nodule in the left lung lower lobe, grossly similar to the prior study, concerning for metastases. 5. Multiple other nonacute observations, as described above. Aortic Atherosclerosis (ICD10-I70.0). Electronically Signed   By: Jules Schick M.D.   On: 11/26/2023 16:25     Assessment and Plan: No notes have been filed under this hospital service. 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 These symptoms have impacted his functionality and he is no longer able to work. His antineoplastic therapy (FOLFOX) is currently on hold due to significant adverse effects. He follows  Dr. Mosetta Putt with oncology. Oncology has been consulted by ED provider, will formally evaluate tomorrow morning. He is hungry right now and thus will start liquid diet and  assess tolerance. Patient does have anasarca likely from liver dysfunction and hypoalbuminemia.  -oncology consulted, appreciate assistance, will await formal recommendations -resume home eliquis -zofran q6h prn for nausea -resume home creon -full liquid diet -PT/OT eval -consulted registered dietician  Anemia of chronic disease Patient with chronic anemia, hemoglobin appears to be gradually downtrending over the past few months.  Hemoglobin was around 11.2 about 3 months ago, 10.5 about 2 months ago, 9.7 about 1 month ago, 9.0 about 11 days ago.  On admission today, hemoglobin 8.4.  No reported bleeding events per patient.  Suspect gradual downtrend is secondary to known metastatic malignancy. - Trend hemoglobin curve, transfuse if hemoglobin less than 7 - f/u iron studies  Small-to-moderate Ascites -noted on CT imaging -no emergent indication for paracentesis at this time -can consider IR paracentesis tomorrow if required -may need to consider albumin infusion with paracentesis  Hyponatremia Sodium 127 today. Patient given IVF in the ED. Will assess response. From examination, appears that patient is likely intravascularly volume deplete given third-spacing of fluid due to hypoalbuminemia. May need to consider albumin infusion to help replete intravascular volume if no improvement with IVF. -trend sodium level -s/p 1L IVF in ED -consider albumin infusion if no improvement  Leukocytosis -WBC almost 49K -likely related to known metastatic cancer and/or antineoplastic therapy -thus far lab work unrevealing for infection -trend WBC curve and fever curve -f/u oncology recommendations  GERD -reports using prilosec OTC prn for symptoms with partial relief -unclear if this is playing role in patient's N/V -will trial protonix 40mg  BID     Advance Care Planning:   Code Status: Full Code   Consults: oncology  Family Communication: updated family at bedside  Severity of  Illness: The appropriate patient status for this patient is OBSERVATION. Observation status is judged to be reasonable and necessary in order to provide the required intensity of service to ensure the patient's safety. The patient's presenting symptoms, physical exam findings, and initial radiographic and laboratory data in the context of their medical condition is felt to place them at decreased risk for further clinical deterioration. Furthermore, it is anticipated that the patient will be medically stable for discharge from the hospital within 2 midnights of admission.   Portions of this note were generated with Scientist, clinical (histocompatibility and immunogenetics). Dictation errors may occur despite best attempts at proofreading.  Author: Briscoe Burns, MD 11/26/2023 6:03 PM  For on call review [email or web address].

## 2023-11-26 NOTE — ED Provider Notes (Signed)
  EMERGENCY DEPARTMENT AT [facility] Provider Note   CSN: [id number] Arrival date & time: 11/26/23  1145     History  Chief Complaint  Patient presents with   Emesis   Bloated    Steve Mills is a 58 y.o. male.  This is a 58 year old male with history of pancreatic cancer on chemotherapy presenting emergency department for generalized malaise, increased fatigue, worsening dyspnea on exertion and decreased p.o. intake with nausea and vomiting.  Not having abdominal pain.  Reports decreased p.o. intake over the past 5 days, seemingly more nauseated and vomiting.  Abdomen is more distended than usual.  Urine is dark-colored.   Emesis      Home Medications Prior to Admission medications   Medication Sig Start Date End Date Taking? Authorizing Provider  apixaban (ELIQUIS) 5 MG TABS tablet Take 1 tablet (5 mg total) by mouth 2 (two) times daily. 06/18/23   [name], MD  dexamethasone (DECADRON) 4 MG tablet Take 2 tablets (8 mg total) by mouth daily. Start the day after chemotherapy for 2 days. Take with food. 10/01/23   [name], MD  lidocaine-prilocaine (EMLA) cream Apply 1 Application topically as needed. 10/16/23   [name], MD  lipase/protease/amylase (CREON) 36000 UNITS CPEP capsule Take 2 capsules (72,000 Units total) by mouth 3 (three) times daily with meals. May also take 1 capsule (36,000 Units total) as needed (with snacks). 04/10/23   Pollyann Samples, NP  loperamide (IMODIUM A-D) 2 MG tablet Take 2 mg by mouth 4 (four) times daily as needed for diarrhea or loose stools.    [provider]  ondansetron (ZOFRAN) 8 MG tablet Take 1 tablet (8 mg total) by mouth every 8 (eight) hours as needed for nausea or vomiting. Start on the third day after chemotherapy. 10/01/23   [name], MD  oxyCODONE (OXY IR/ROXICODONE) 5 MG immediate release tablet Take 5 mg by mouth. 11/23/23   [provider]  potassium chloride SA (KLOR-CON M20) 20 MEQ tablet  Take 1 tablet (20 mEq total) by mouth 2 (two) times daily. 10/30/23   [name], MD  prochlorperazine (COMPAZINE) 10 MG tablet Take 1 tablet (10 mg total) by mouth every 6 (six) hours as needed for nausea or vomiting. 10/01/23   [name], MD  valACYclovir (VALTREX) 500 MG tablet Take 1 tablet (500 mg total) by mouth 2 (two) times daily. 02/21/23   Loa Socks, NP      Allergies    Patient has no known allergies.    Review of Systems   Review of Systems  Gastrointestinal:  Positive for vomiting.    Physical Exam Updated Vital Signs BP 102/63   Pulse 89   Temp 97.9 F (36.6 C) (Oral)   Resp 17   SpO2 98%  Physical Exam Vitals and nursing note reviewed.  Constitutional:      General: He is not in acute distress.    Appearance: He is obese. He is not toxic-appearing.  HENT:     Head: Normocephalic.     Nose: Nose normal.  Eyes:     Conjunctiva/sclera: Conjunctivae normal.  Cardiovascular:     Rate and Rhythm: Normal rate and regular rhythm.  Pulmonary:     Effort: Pulmonary effort is normal.     Breath sounds: Normal breath sounds.  Abdominal:     General: Abdomen is flat. There is distension.     Tenderness: There is no abdominal tenderness. There is no guarding  or rebound.  Musculoskeletal:        General: Normal range of motion.     Right lower leg: No edema.     Left lower leg: No edema.  Skin:    General: Skin is warm.     Capillary Refill: Capillary refill takes less than 2 seconds.  Neurological:     Mental Status: He is alert and oriented to person, place, and time.  Psychiatric:        Mood and Affect: Mood normal.        Behavior: Behavior normal.     ED Results / Procedures / Treatments   Labs (all labs ordered are listed, but only abnormal results are displayed) Labs Reviewed  LIPASE, BLOOD - Abnormal; Notable for the following components:      Result Value   Lipase 155 (*)    All other components within normal limits  COMPREHENSIVE  METABOLIC PANEL WITH GFR - Abnormal; Notable for the following components:   Sodium 127 (*)    Chloride 96 (*)    CO2 20 (*)    Glucose, Bld 115 (*)    Calcium 7.6 (*)    Total Protein 5.9 (*)    Albumin 1.9 (*)    Alkaline Phosphatase 382 (*)    Total Bilirubin 1.9 (*)    All other components within normal limits  CBC - Abnormal; Notable for the following components:   WBC 48.7 (*)    RBC 3.38 (*)    Hemoglobin 8.4 (*)    HCT 27.8 (*)    MCH 24.9 (*)    RDW 20.5 (*)    All other components within normal limits  URINALYSIS, ROUTINE W REFLEX MICROSCOPIC - Abnormal; Notable for the following components:   Color, Urine AMBER (*)    All other components within normal limits    EKG EKG Interpretation Date/Time:  Monday November 26 2023 11:57:08 EDT Ventricular Rate:  93 PR Interval:  155 QRS Duration:  95 QT Interval:  364 QTC Calculation: 453 R Axis:   83  Text Interpretation: Sinus rhythm Multiple ventricular premature complexes Borderline low voltage, extremity leads Nonspecific T abnormalities, anterior leads Confirmed by Estanislado Pandy [phone]) on 11/26/2023 3:08:56 PM  Radiology CT ABDOMEN PELVIS W CONTRAST Result Date: 11/26/2023 CLINICAL DATA:  Bowel obstruction suspected. Emesis. History of pancreatic cancer. * Tracking Code: BO * EXAM: CT ABDOMEN AND PELVIS WITH CONTRAST TECHNIQUE: Multidetector CT imaging of the abdomen and pelvis was performed using the standard protocol following bolus administration of intravenous contrast. RADIATION DOSE REDUCTION: This exam was performed according to the departmental dose-optimization program which includes automated exposure control, adjustment of the mA and/or kV according to patient size and/or use of iterative reconstruction technique. CONTRAST:  OMNIPAQUE IOHEXOL 300 MG/ML  SOLN COMPARISON:  CT scan abdomen and pelvis from 11/15/2023. FINDINGS: Lower chest: There is a 3 x 4 mm solid noncalcified nodule in the left lung lower lobe  (series 6, image 29), grossly similar to the prior study but concerning for metastases. There are patchy atelectatic changes in the visualized lung bases. No overt consolidation. No pleural effusion. The heart is normal in size. No pericardial effusion. Hepatobiliary: The liver is normal in size. Non-cirrhotic configuration. Redemonstration of extensive ill-defined hypoattenuating masses throughout the liver, compatible with metastasis. When compared to the recent prior exam, no significant interval change. No intrahepatic bile duct dilation. There is mild prominence of the extrahepatic bile duct, most likely due to post cholecystectomy status.  Gallbladder is surgically absent. Pancreas: Redemonstration of patient's known heterogeneous hypoattenuating mass centered in the pancreatic head/uncinate process region, grossly similar to the prior study. Spleen: Within normal limits. No focal lesion. Adrenals/Urinary Tract: Adrenal glands are unremarkable. No suspicious renal mass. Multiple bilateral renal sinus cysts again noted. No hydronephrosis. No renal or ureteric calculi. Unremarkable urinary bladder. Stomach/Bowel: No disproportionate dilation of the small or large bowel loops. No evidence of abnormal bowel wall thickening or inflammatory changes. The appendix was not visualized; however there is no acute inflammatory process in the right lower quadrant. Vascular/Lymphatic: There is small-to-moderate ascites, increased since the prior study. No pneumoperitoneum. Redemonstration of extensive enlarged/heterogeneous lymphadenopathy with largest in the portacaval region measuring up to 3.4 x 5.1 cm, grossly similar to the prior study. Redemonstration of irregular soft tissue attenuation nodules mainly in the right abdomen, compatible with peritoneal carcinomatosis no significant interval change since the prior study. No aneurysmal dilation of the major abdominal arteries. There are mild peripheral atherosclerotic  vascular calcifications of the aorta and its major branches. Reproductive: Normal size prostate. Symmetric seminal vesicles. Other: There are fat containing umbilical and bilateral inguinal hernias. The soft tissues and abdominal wall are otherwise unremarkable. Musculoskeletal: Stable subcentimeter sized sclerotic foci in the left ischium. There are mild multilevel degenerative changes in the visualized spine. IMPRESSION: 1. No bowel obstruction. 2. Redemonstration of patient's known pancreatic head/uncinate process mass, extensive hepatic metastases, peritoneal carcinomatosis and metastatic lymphadenopathy, as described in detail above. No significant interval change since the recent prior study. 3. Small-to-moderate ascites, increased since the prior study. 4. There is a 3 x 4 mm solid noncalcified nodule in the left lung lower lobe, grossly similar to the prior study, concerning for metastases. 5. Multiple other nonacute observations, as described above. Aortic Atherosclerosis (ICD10-I70.0). Electronically Signed   By: Jules Schick M.D.   On: 11/26/2023 16:25    Procedures Procedures    Medications Ordered in ED Medications  ondansetron (ZOFRAN) injection 4 mg (4 mg Intravenous Given 11/26/23 1246)  lactated ringers bolus 1,000 mL (0 mLs Intravenous Stopped 11/26/23 1546)  iohexol (OMNIPAQUE) 300 MG/ML solution 100 mL (100 mLs Intravenous Contrast Given 11/26/23 1520)    ED Course/ Medical Decision Making/ A&P Clinical Course as of 11/26/23 1714  Mon Nov 26, 2023  1629 CT ABDOMEN PELVIS W CONTRAST IMPRESSION: 1. No bowel obstruction. 2. Redemonstration of patient's known pancreatic head/uncinate process mass, extensive hepatic metastases, peritoneal carcinomatosis and metastatic lymphadenopathy, as described in detail above. No significant interval change since the recent prior study. 3. Small-to-moderate ascites, increased since the prior study. 4. There is a 3 x 4 mm solid noncalcified  nodule in the left lung lower lobe, grossly similar to the prior study, concerning for metastases. 5. Multiple other nonacute observations, as described above.  Aortic Atherosclerosis (ICD10-I70.0).   [TY]    Clinical Course User Index [TY] Coral Spikes, DO                                 Medical Decision Making 58 year old male presenting emergency department for abdominal pain in the setting of pancreatic cancer.  He is afebrile, not tachycardic, hemodynamically stable.  Benign abdominal exam, although it is distended.  Patient with hyponatremia, hypochloride which would suggest dehydration.  His liver enzymes and lipase appears to be at his baseline.  Does have significant increase in his white blood cell count which I suspect secondary to  his chemotherapy.  Worsening anemia.  Continues to feel poorly despite IV fluids.  Was given Zofran here, still nauseated.  Given patient's complex past medical history will admit for further IV hydration and management of his symptoms.  Case discussed with hospitalist agrees to see and admit patient.  Requesting oncology consult.  Oncology consulted, will see patient in consult tomorrow morning and make further recommendations.  Amount and/or Complexity of Data Reviewed Independent Historian:     Details: Family notes patient extremely weak. External Data Reviewed:     Details: On Eliquis and chemotherapy. Labs: ordered. Decision-making details documented in ED Course. Radiology: ordered. Decision-making details documented in ED Course. ECG/medicine tests: ordered.  Risk Prescription drug management. Decision regarding hospitalization.          Final Clinical Impression(s) / ED Diagnoses Final diagnoses:  Hyponatremia  Nausea and vomiting, unspecified vomiting type    Rx / DC Orders ED Discharge Orders     None         Coral Spikes, DO 11/26/23 1714

## 2023-11-26 NOTE — Telephone Encounter (Signed)
 Pt's wife called stating that the pt is not drinking nor eating at home.  Wife stated that the pt is having 5/10 bilateral lower back pain.  Pt denied peripheral edema.  Pt's wife stated the pt is diaphoretic but denied fevers.  Wife stated that pt has not urinated since 11/25/2023.  Pt denied blood and pain w/urination.  Pt described urine to be dark brown in color.  Pt's wife stated the pt went to the ED on 11/22/2023 at which time Dr. Mosetta Putt spoke w/them regarding the results of the pt's CT Scan.  Pt's wife stated that Dr. Mosetta Putt told them that the pt's cancer has progressed and is now in the pt's lungs.  Pt's wife stated the pt's stomach is extremely swollen.  Pt c/o of SOB and nausea when eating.  Pt stated he's vomitting once daily usually after trying to eat or drink.  Pt c/o of severe fatigue.  Place pt and wife on hold while this nurse speak w/Dr. Mosetta Putt regarding pt's symptoms.  Spoke w/Dr. Mosetta Putt regarding pt's symptoms and verbal instructions given to instruct pt to go to the ED for further evaluation.  Informed pt and spouse of Dr. Latanya Maudlin instructions.  Pt's wife stated she will call EMS to take pt to ED for further evaluation.  Pt and wife had no further questions or concerns at this time.

## 2023-11-27 ENCOUNTER — Observation Stay (HOSPITAL_COMMUNITY)

## 2023-11-27 ENCOUNTER — Other Ambulatory Visit: Payer: Self-pay

## 2023-11-27 DIAGNOSIS — C259 Malignant neoplasm of pancreas, unspecified: Secondary | ICD-10-CM | POA: Diagnosis not present

## 2023-11-27 DIAGNOSIS — R188 Other ascites: Secondary | ICD-10-CM | POA: Diagnosis not present

## 2023-11-27 LAB — HIV ANTIBODY (ROUTINE TESTING W REFLEX): HIV Screen 4th Generation wRfx: NONREACTIVE

## 2023-11-27 LAB — CBC
HCT: 27.7 % — ABNORMAL LOW (ref 39.0–52.0)
Hemoglobin: 8.5 g/dL — ABNORMAL LOW (ref 13.0–17.0)
MCH: 25.5 pg — ABNORMAL LOW (ref 26.0–34.0)
MCHC: 30.7 g/dL (ref 30.0–36.0)
MCV: 83.2 fL (ref 80.0–100.0)
Platelets: 278 10*3/uL (ref 150–400)
RBC: 3.33 MIL/uL — ABNORMAL LOW (ref 4.22–5.81)
RDW: 20.8 % — ABNORMAL HIGH (ref 11.5–15.5)
WBC: 46.7 10*3/uL — ABNORMAL HIGH (ref 4.0–10.5)
nRBC: 0 % (ref 0.0–0.2)

## 2023-11-27 LAB — COMPREHENSIVE METABOLIC PANEL WITH GFR
ALT: 23 U/L (ref 0–44)
AST: 33 U/L (ref 15–41)
Albumin: 1.8 g/dL — ABNORMAL LOW (ref 3.5–5.0)
Alkaline Phosphatase: 362 U/L — ABNORMAL HIGH (ref 38–126)
Anion gap: 9 (ref 5–15)
BUN: 18 mg/dL (ref 6–20)
CO2: 22 mmol/L (ref 22–32)
Calcium: 7.4 mg/dL — ABNORMAL LOW (ref 8.9–10.3)
Chloride: 95 mmol/L — ABNORMAL LOW (ref 98–111)
Creatinine, Ser: 0.97 mg/dL (ref 0.61–1.24)
GFR, Estimated: >60 mL/min (ref >60)
Glucose, Bld: 110 mg/dL — ABNORMAL HIGH (ref 70–99)
Potassium: 4.2 mmol/L (ref 3.5–5.1)
Sodium: 126 mmol/L — ABNORMAL LOW (ref 135–145)
Total Bilirubin: 2.3 mg/dL — ABNORMAL HIGH (ref 0.0–1.2)
Total Protein: 5.5 g/dL — ABNORMAL LOW (ref 6.5–8.1)

## 2023-11-27 LAB — IRON AND TIBC
Iron: 29 ug/dL — ABNORMAL LOW (ref 45–182)
Saturation Ratios: 16 % — ABNORMAL LOW (ref 17.9–39.5)
TIBC: 182 ug/dL — ABNORMAL LOW (ref 250–450)
UIBC: 153 ug/dL

## 2023-11-27 LAB — PROTIME-INR
INR: 1.5 — ABNORMAL HIGH (ref 0.8–1.2)
Prothrombin Time: 18.6 s — ABNORMAL HIGH (ref 11.4–15.2)

## 2023-11-27 LAB — APTT: aPTT: 30 s (ref 24–36)

## 2023-11-27 LAB — FERRITIN: Ferritin: 1093 ng/mL — ABNORMAL HIGH (ref 24–336)

## 2023-11-27 MED ORDER — SODIUM CHLORIDE 0.9 % IV SOLN: INTRAVENOUS | Status: DC

## 2023-11-27 MED ORDER — LIDOCAINE HCL 1 % IJ SOLN
INTRAMUSCULAR | Status: AC
Start: 2023-11-27 — End: ?
  Filled 2023-11-27: qty 20

## 2023-11-27 NOTE — Evaluation (Signed)
 Occupational Therapy Evaluation Patient Details Name: Steve Mills MRN: [id number] DOB: [birth year] Today's Date: 11/27/2023   History of Present Illness   Patient is a 58 year old male who presented with vomiting and decreased oral intake. Patient was admitted with failure to thrive, hypoalbuminemia, anemia of chronic disease, small to moderate ascites, hyponatremia, leukocytosis. PMH: GERD, pancreatic cancer with mets to liver, chronic anemia, obesity,     Clinical Impressions Patient evaluated by Occupational Therapy with no further acute OT needs identified. All education has been completed and the patient has no further questions. Patient endorsed being at baseline at this time for ADLs with wife reporting they have been able to support this level at home. Patient was educated on going slow with transitions with BP dropping. Patient verbalized understanding.  See below for any follow-up Occupational Therapy or equipment needs. OT is signing off. Thank you for this referral.      If plan is discharge home, recommend the following:   A little help with walking and/or transfers;Assistance with cooking/housework;Direct supervision/assist for medications management;Assist for transportation;Help with stairs or ramp for entrance;Direct supervision/assist for financial management;A little help with bathing/dressing/bathroom      Equipment Recommendations   None recommended by OT      Precautions/Restrictions   Precautions Precautions: Fall Precaution/Restrictions Comments: orthostatic Restrictions Weight Bearing Restrictions Per Provider Order: No     Mobility Bed Mobility Overal bed mobility: Modified Independent                          ADL either performed or assessed with clinical judgement   ADL Overall ADL's : At baseline           General ADL Comments: patient is max A for LB Dressing tasks with wife reporting she does these for patient at home.  patient was able to complete bed mobility and transfer with no A but supervision and occasional cues for safety. needed education on using nonskid socks over wool socks when up on feet to prevent sliding. patient endorsed being at his baseline for ADLs at this point.     Vision Baseline Vision/History: 1 Wears glasses Vision Assessment?: No apparent visual deficits;Wears glasses for reading            Pertinent Vitals/Pain Pain Assessment Pain Assessment: Faces Faces Pain Scale: Hurts little more Pain Location: back with movement Pain Descriptors / Indicators: Grimacing, Constant Pain Intervention(s): Limited activity within patient's tolerance, Monitored during session     Extremity/Trunk Assessment Upper Extremity Assessment Upper Extremity Assessment: Overall WFL for tasks assessed   Lower Extremity Assessment Lower Extremity Assessment: Defer to PT evaluation   Cervical / Trunk Assessment Cervical / Trunk Assessment: Normal   Communication     Cognition Arousal: Alert Behavior During Therapy: WFL for tasks assessed/performed Cognition: No apparent impairments             OT - Cognition Comments: wife was present as well.                 Following commands: Intact       Cueing  General Comments      patient reported dizziness with sitting EOB and transfer to recliner with BP reading 89/76 mmhg sitting in recliner with feet on floor. patients BP was taken again with BP of 131/77 mmhg with feet elevated in recliner. nurse made aware. patient and wife educated on importance of spending time out of bed and upright positioning  to reduce dizziness with transitions.           Home Living Family/patient expects to be discharged to:: Private residence Living Arrangements: Spouse/significant other Available Help at Discharge: Family;Available 24 hours/day                         Home Equipment: Shower seat;BSC/3in1          Prior  Functioning/Environment Prior Level of Function : Needs assist               ADLs Comments: patient reported since last change in chemo he has been home bound with getitng up to bathroom and living room some but mostly inbed wife helps with LB ADls and IADLs            OT Goals(Current goals can be found in the care plan section)   Acute Rehab OT Goals OT Goal Formulation: All assessment and education complete, DC therapy   OT Frequency:          AM-PAC OT "6 Clicks" Daily Activity     Outcome Measure Help from another person eating meals?: None Help from another person taking care of personal grooming?: None Help from another person toileting, which includes using toliet, bedpan, or urinal?: A Little Help from another person bathing (including washing, rinsing, drying)?: A Little Help from another person to put on and taking off regular upper body clothing?: None Help from another person to put on and taking off regular lower body clothing?: A Little 6 Click Score: 21   End of Session Nurse Communication: Other (comment) (ok to participate in session, updated on vitals during session)  Activity Tolerance: Patient tolerated treatment well Patient left: in chair;with call bell/phone within reach;with family/visitor present;Other (comment) (NT  in room)  OT Visit Diagnosis: Dizziness and giddiness (R42)                Time: 1610-9604 OT Time Calculation (min): 16 min Charges:  OT General Charges $OT Visit: 1 Visit OT Evaluation $OT Eval Low Complexity: 1 Low  Makesha Belitz OTR/L, MS Acute Rehabilitation Department Office# [phone]   Selinda Flavin 11/27/2023, 11:02 AM

## 2023-11-27 NOTE — Progress Notes (Signed)
 Patient ID: Steve Mills, male   DOB: 1966-02-09, 58 y.o.   MRN: [id number] Patient presented to ultrasound department today for therapeutic paracentesis.  On limited ultrasound of abdomen in all 4 quadrants there is only a small amount of ascites noted in the perihepatic region.  Patient did not wish to proceed at this time.  Procedure canceled.

## 2023-11-27 NOTE — Progress Notes (Signed)
 [facility] Liaison Note  Received request from [facility] for hospice services at home after discharge. Spoke with patient to initiate education related to hospice philosophy, services and team approach to care. Patient verbalized understanding of information given. Per discussion, the plan for discharge is tomorrow.   DME needs discussed. Patient has the following equipment in the home: walker, wheelchair. Family requests the following equipment for delivery: BSC, shower chair.  Please send signed and completed DNR home with patient/family. Please provide prescriptions at discharge as needed to ensure ongoing symptom management.  [facility] information and contact numbers given to patient. Please call with any concerns.  Thank you for the opportunity to participate in this patient's care.   Henderson Newcomer, LPN [facility] Liaison [phone]

## 2023-11-27 NOTE — Plan of Care (Signed)

## 2023-11-27 NOTE — Progress Notes (Signed)
   11/27/23 1444  PT Visit Information  Reason Eval/Treat Not Completed Patient declined, no reason specified  History of Present Illness Patient is a 58 year old male who presented with vomiting and decreased oral intake. Patient was admitted with failure to thrive, hypoalbuminemia, anemia of chronic disease, small to moderate ascites, hyponatremia, leukocytosis. PMH: GERD, pancreatic cancer with mets to liver, chronic anemia, obesity,   Pt states that he was up this morning following OT eval, family present in room. Pt defers PT stating "I think that was enough for today", agreeable to follow up tomorrow.   Madaline Guthrie, PT Acute Rehabilitation Services Office: [phone] 11/27/2023

## 2023-11-27 NOTE — Progress Notes (Signed)
 PROGRESS NOTE    Steve Mills  UJW:[id number] DOB: Sep 13, 1965 DOA: 11/26/2023 PCP: [facility] [place]   Brief Narrative:  58 y.o. male with medical history significant of pancreatic cancer with metastasis to liver on chemotherapy as an outpatient, chronic anemia, obesity, s/p cholecystectomy (2019) presented with worsening oral intake and vomiting.  On presentation, WBC was 48.7, alkaline phosphatase 382, albumin 1.9, total bilirubin of 1.9 and lipase of 155.  UA was negative for infection.  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 CT imaging also showing small-to-moderate ascites, increased since prior study. Lastly, CT showing solid noncalcified left lower lobe lung nodule concerning for metastases (similar to prior study). Oncology consulted by EDP.   Assessment & Plan:   Failure to thrive Pancreatic cancer with metastasis to liver and possibly lung with peritoneal carcinomatosis and metastatic lymphadenopathy with small to moderate ascites Hypoalbuminemia Anasarca Elevated liver enzymes -Presented with decreased oral intake and vomiting with CT as above - Follows up with Dr. Feng/oncology as an outpatient: FOLFOX currently on hold due to significant adverse effects - Follow oncology recommendations. - Continue as needed antiemetics.  Advance diet as tolerated - Follow nutrition recommendations. - Might need IR paracentesis.  Anemia of chronic disease - From cancer and chemotherapy.  Hemoglobin stable.  No signs of bleeding.  Monitor  Leukocytosis - WBC still elevated at 46.7.  Possibly from metastatic disease and/or antineoplastic therapy - No signs of infection.  Afebrile.  Monitor  Hyponatremia - Sodium 127 on presentation.  Given IV fluids in the ED.  No labs today.   Monitor.  GERD - Continue Protonix  DVT prophylaxis: Eliquis Code Status: Full Family Communication: Wife at bedside Disposition Plan: Status is: Observation The patient will require care spanning > 2 midnights and should be moved to inpatient because: Of severity of illness.    Consultants: Oncology  Procedures: None  Antimicrobials: None   Subjective: Patient seen and examined at bedside.  Continues to feel weak with nausea.  Denies worsening abdominal pain.  No fever, worsening shortness of breath or chest pain reported.  Objective: Vitals:   11/26/23 1811 11/26/23 2008 11/27/23 0030 11/27/23 0400  BP:  100/73 105/72 105/78  Pulse: 92 86 80 81  Resp: 20 20 20 20   Temp:  98 F (36.7 C) (!) 97 F (36.1 C) (!) 97.3 F (36.3 C)  TempSrc:  Oral Oral Oral  SpO2: 96% 94% 95% 95%  Weight:    98.5 kg    Intake/Output Summary (Last 24 hours) at 11/27/2023 0733 Last data filed at 11/27/2023 0700 Gross per 24 hour  Intake 1000 ml  Output 1000 ml  Net 0 ml   Filed Weights   11/27/23 0400  Weight: 98.5 kg    Examination:  General exam: Appears calm and comfortable.  On room air. Respiratory system: Bilateral decreased breath sounds at bases with scattered crackles Cardiovascular system: S1 & S2 heard, Rate controlled Gastrointestinal system: Abdomen is distended, soft and nontender. Normal bowel sounds heard. Extremities: No cyanosis, clubbing; bilateral lower extremity edema present Central nervous system: Alert and oriented. No focal neurological deficits. Moving extremities Skin: No rashes, lesions or ulcers Psychiatry: Flat affect.  Not agitated.     Data Reviewed: I have personally reviewed following labs and imaging studies  CBC: Recent Labs  Lab 11/26/23 1255 11/27/23 0554  WBC 48.7* 46.7*  HGB 8.4* 8.5*  HCT 27.8* 27.7*  MCV 82.2 83.2  PLT 325 278   Basic Metabolic Panel: Recent Labs  Lab 11/26/23 1255  NA 127*  K 4.2  CL 96*  CO2 20*   GLUCOSE 115*  BUN 15  CREATININE 0.94  CALCIUM 7.6*   GFR: Estimated Creatinine Clearance: 104.2 mL/min (by C-G formula based on SCr of 0.94 mg/dL). Liver Function Tests: Recent Labs  Lab 11/26/23 1255  AST 29  ALT 24  ALKPHOS 382*  BILITOT 1.9*  PROT 5.9*  ALBUMIN 1.9*   Recent Labs  Lab 11/26/23 1255  LIPASE 155*   No results for input(s): "AMMONIA" in the last 168 hours. Coagulation Profile: Recent Labs  Lab 11/27/23 0554  INR 1.5*   Cardiac Enzymes: No results for input(s): "CKTOTAL", "CKMB", "CKMBINDEX", "TROPONINI" in the last 168 hours. BNP (last 3 results) No results for input(s): "PROBNP" in the last 8760 hours. HbA1C: No results for input(s): "HGBA1C" in the last 72 hours. CBG: No results for input(s): "GLUCAP" in the last 168 hours. Lipid Profile: No results for input(s): "CHOL", "HDL", "LDLCALC", "TRIG", "CHOLHDL", "LDLDIRECT" in the last 72 hours. Thyroid Function Tests: No results for input(s): "TSH", "T4TOTAL", "FREET4", "T3FREE", "THYROIDAB" in the last 72 hours. Anemia Panel: No results for input(s): "VITAMINB12", "FOLATE", "FERRITIN", "TIBC", "IRON", "RETICCTPCT" in the last 72 hours. Sepsis Labs: No results for input(s): "PROCALCITON", "LATICACIDVEN" in the last 168 hours.  No results found for this or any previous visit (from the past 240 hours).       Radiology Studies: CT ABDOMEN PELVIS W CONTRAST Result Date: 11/26/2023 CLINICAL DATA:  Bowel obstruction suspected. Emesis. History of pancreatic cancer. * Tracking Code: BO * EXAM: CT ABDOMEN AND PELVIS WITH CONTRAST TECHNIQUE: Multidetector CT imaging of the abdomen and pelvis was performed using the standard protocol following bolus administration of intravenous contrast. RADIATION DOSE REDUCTION: This exam was performed according to the departmental dose-optimization program which includes automated exposure control, adjustment of the mA and/or kV according to patient size and/or use of  iterative reconstruction technique. CONTRAST:  OMNIPAQUE IOHEXOL 300 MG/ML  SOLN COMPARISON:  CT scan abdomen and pelvis from 11/15/2023. FINDINGS: Lower chest: There is a 3 x 4 mm solid noncalcified nodule in the left lung lower lobe (series 6, image 29), grossly similar to the prior study but concerning for metastases. There are patchy atelectatic changes in the visualized lung bases. No overt consolidation. No pleural effusion. The heart is normal in size. No pericardial effusion. Hepatobiliary: The liver is normal in size. Non-cirrhotic configuration. Redemonstration of extensive ill-defined hypoattenuating masses throughout the liver, compatible with metastasis. When compared to the recent prior exam, no significant interval change. No intrahepatic bile duct dilation. There is mild prominence of the extrahepatic bile duct, most likely due to post cholecystectomy status. Gallbladder is surgically absent. Pancreas: Redemonstration of patient's known heterogeneous hypoattenuating mass centered in the pancreatic head/uncinate process region, grossly similar to the prior study. Spleen: Within normal limits. No focal lesion. Adrenals/Urinary Tract: Adrenal glands are unremarkable. No suspicious renal mass. Multiple bilateral renal sinus cysts again noted. No hydronephrosis. No renal or ureteric calculi. Unremarkable urinary bladder. Stomach/Bowel: No disproportionate dilation of the small or large bowel loops. No evidence of abnormal bowel wall thickening or inflammatory changes. The appendix was not visualized; however there is no acute inflammatory process in the right lower quadrant. Vascular/Lymphatic: There is small-to-moderate ascites, increased since the prior study. No pneumoperitoneum. Redemonstration of extensive enlarged/heterogeneous lymphadenopathy with  largest in the portacaval region measuring up to 3.4 x 5.1 cm, grossly similar to the prior study. Redemonstration of irregular soft tissue  attenuation nodules mainly in the right abdomen, compatible with peritoneal carcinomatosis no significant interval change since the prior study. No aneurysmal dilation of the major abdominal arteries. There are mild peripheral atherosclerotic vascular calcifications of the aorta and its major branches. Reproductive: Normal size prostate. Symmetric seminal vesicles. Other: There are fat containing umbilical and bilateral inguinal hernias. The soft tissues and abdominal wall are otherwise unremarkable. Musculoskeletal: Stable subcentimeter sized sclerotic foci in the left ischium. There are mild multilevel degenerative changes in the visualized spine. IMPRESSION: 1. No bowel obstruction. 2. Redemonstration of patient's known pancreatic head/uncinate process mass, extensive hepatic metastases, peritoneal carcinomatosis and metastatic lymphadenopathy, as described in detail above. No significant interval change since the recent prior study. 3. Small-to-moderate ascites, increased since the prior study. 4. There is a 3 x 4 mm solid noncalcified nodule in the left lung lower lobe, grossly similar to the prior study, concerning for metastases. 5. Multiple other nonacute observations, as described above. Aortic Atherosclerosis (ICD10-I70.0). Electronically Signed   By: Jules Schick M.D.   On: 11/26/2023 16:25        Scheduled Meds:  apixaban  5 mg Oral BID   lipase/protease/amylase  72,000 Units Oral TID WC   pantoprazole  40 mg Oral BID   Continuous Infusions:        Glade Lloyd, MD [facility] 11/27/2023, 7:33 AM

## 2023-11-27 NOTE — Progress Notes (Addendum)
 Steve Mills   DOB:[birth year]   WU#:[id number]   GNF#:[id number]  Medical oncology follow-up note  Subjective: Patient is well-known to me, under my care for his metastatic pancreatic cancer.  Since his recent hospital discharge, he has become progressively weak, bedbound, with recurrent nausea, anorexia, and abdominal distention.  He was admitted yesterday for worsening symptoms.   Objective:  Vitals:   11/27/23 0030 11/27/23 0400  BP: 105/72 105/78  Pulse: 80 81  Resp: 20 20  Temp: (!) 97 F (36.1 C) (!) 97.3 F (36.3 C)  SpO2: 95% 95%    Body mass index is 29.45 kg/m.  Intake/Output Summary (Last 24 hours) at 11/27/2023 0809 Last data filed at 11/27/2023 0700 Gross per 24 hour  Intake 1000 ml  Output 1000 ml  Net 0 ml     Sclerae unicteric  Oropharynx clear  No peripheral adenopathy  Lungs clear -- no rales or rhonchi  Heart regular rate and rhythm  Abdomen soft, distended, positive fluid sign  MSK no focal spinal tenderness, no peripheral edema  Neuro nonfocal   CBG (last 3)  No results for input(s): "GLUCAP" in the last 72 hours.   Labs:  Lab Results  Component Value Date   WBC 46.7 (H) 11/27/2023   HGB 8.5 (L) 11/27/2023   HCT 27.7 (L) 11/27/2023   MCV 83.2 11/27/2023   PLT 278 11/27/2023   NEUTROABS 7.2 11/15/2023     Urine Studies No results for input(s): "UHGB", "CRYS" in the last 72 hours.  Invalid input(s): "UACOL", "UAPR", "USPG", "UPH", "UTP", "UGL", "UKET", "UBIL", "UNIT", "UROB", "ULEU", "UEPI", "UWBC", "URBC", "UBAC", "CAST", "UCOM", "BILUA"  Basic Metabolic Panel: Recent Labs  Lab 11/26/23 1255 11/27/23 0554  NA 127* 126*  K 4.2 4.2  CL 96* 95*  CO2 20* 22  GLUCOSE 115* 110*  BUN 15 18  CREATININE 0.94 0.97  CALCIUM 7.6* 7.4*   GFR Estimated Creatinine Clearance: 101 mL/min (by C-G formula based on SCr of 0.97 mg/dL). Liver Function Tests: Recent Labs  Lab 11/26/23 1255 11/27/23 0554  AST 29 33  ALT 24 23  ALKPHOS 382*  362*  BILITOT 1.9* 2.3*  PROT 5.9* 5.5*  ALBUMIN 1.9* 1.8*   Recent Labs  Lab 11/26/23 1255  LIPASE 155*   No results for input(s): "AMMONIA" in the last 168 hours. Coagulation profile Recent Labs  Lab 11/27/23 0554  INR 1.5*    CBC: Recent Labs  Lab 11/26/23 1255 11/27/23 0554  WBC 48.7* 46.7*  HGB 8.4* 8.5*  HCT 27.8* 27.7*  MCV 82.2 83.2  PLT 325 278   Cardiac Enzymes: No results for input(s): "CKTOTAL", "CKMB", "CKMBINDEX", "TROPONINI" in the last 168 hours. BNP: Invalid input(s): "POCBNP" CBG: No results for input(s): "GLUCAP" in the last 168 hours. D-Dimer No results for input(s): "DDIMER" in the last 72 hours. Hgb A1c No results for input(s): "HGBA1C" in the last 72 hours. Lipid Profile No results for input(s): "CHOL", "HDL", "LDLCALC", "TRIG", "CHOLHDL", "LDLDIRECT" in the last 72 hours. Thyroid function studies No results for input(s): "TSH", "T4TOTAL", "T3FREE", "THYROIDAB" in the last 72 hours.  Invalid input(s): "FREET3" Anemia work up Recent Labs    11/27/23 0554  FERRITIN 1,093*  TIBC 182*  IRON 29*   Microbiology No results found for this or any previous visit (from the past 240 hours).    Studies:  CT ABDOMEN PELVIS W CONTRAST Result Date: 11/26/2023 CLINICAL DATA:  Bowel obstruction suspected. Emesis. History of pancreatic cancer. * Tracking Code: BO *  EXAM: CT ABDOMEN AND PELVIS WITH CONTRAST TECHNIQUE: Multidetector CT imaging of the abdomen and pelvis was performed using the standard protocol following bolus administration of intravenous contrast. RADIATION DOSE REDUCTION: This exam was performed according to the departmental dose-optimization program which includes automated exposure control, adjustment of the mA and/or kV according to patient size and/or use of iterative reconstruction technique. CONTRAST:  OMNIPAQUE IOHEXOL 300 MG/ML  SOLN COMPARISON:  CT scan abdomen and pelvis from 11/15/2023. FINDINGS: Lower chest: There is a  3 x 4 mm solid noncalcified nodule in the left lung lower lobe (series 6, image 29), grossly similar to the prior study but concerning for metastases. There are patchy atelectatic changes in the visualized lung bases. No overt consolidation. No pleural effusion. The heart is normal in size. No pericardial effusion. Hepatobiliary: The liver is normal in size. Non-cirrhotic configuration. Redemonstration of extensive ill-defined hypoattenuating masses throughout the liver, compatible with metastasis. When compared to the recent prior exam, no significant interval change. No intrahepatic bile duct dilation. There is mild prominence of the extrahepatic bile duct, most likely due to post cholecystectomy status. Gallbladder is surgically absent. Pancreas: Redemonstration of patient's known heterogeneous hypoattenuating mass centered in the pancreatic head/uncinate process region, grossly similar to the prior study. Spleen: Within normal limits. No focal lesion. Adrenals/Urinary Tract: Adrenal glands are unremarkable. No suspicious renal mass. Multiple bilateral renal sinus cysts again noted. No hydronephrosis. No renal or ureteric calculi. Unremarkable urinary bladder. Stomach/Bowel: No disproportionate dilation of the small or large bowel loops. No evidence of abnormal bowel wall thickening or inflammatory changes. The appendix was not visualized; however there is no acute inflammatory process in the right lower quadrant. Vascular/Lymphatic: There is small-to-moderate ascites, increased since the prior study. No pneumoperitoneum. Redemonstration of extensive enlarged/heterogeneous lymphadenopathy with largest in the portacaval region measuring up to 3.4 x 5.1 cm, grossly similar to the prior study. Redemonstration of irregular soft tissue attenuation nodules mainly in the right abdomen, compatible with peritoneal carcinomatosis no significant interval change since the prior study. No aneurysmal dilation of the major  abdominal arteries. There are mild peripheral atherosclerotic vascular calcifications of the aorta and its major branches. Reproductive: Normal size prostate. Symmetric seminal vesicles. Other: There are fat containing umbilical and bilateral inguinal hernias. The soft tissues and abdominal wall are otherwise unremarkable. Musculoskeletal: Stable subcentimeter sized sclerotic foci in the left ischium. There are mild multilevel degenerative changes in the visualized spine. IMPRESSION: 1. No bowel obstruction. 2. Redemonstration of patient's known pancreatic head/uncinate process mass, extensive hepatic metastases, peritoneal carcinomatosis and metastatic lymphadenopathy, as described in detail above. No significant interval change since the recent prior study. 3. Small-to-moderate ascites, increased since the prior study. 4. There is a 3 x 4 mm solid noncalcified nodule in the left lung lower lobe, grossly similar to the prior study, concerning for metastases. 5. Multiple other nonacute observations, as described above. Aortic Atherosclerosis (ICD10-I70.0). Electronically Signed   By: Jules Schick M.D.   On: 11/26/2023 16:25    Assessment: 58 y.o. male   Metastatic pancreatic cancer New onset ascites Failure to thrive Elevated liver enzymes and hyperbilirubinemia Anemia of chronic disease Leukocytosis    Plan:  -I personally reviewed his CT scan images, unfortunately showed diffuse metastasis, progressed overall. -He has progressed on multiple line chemotherapy, no standard chemotherapy is available at this point.  I discussed the reason the Guardant360 liquid biopsy results, unfortunately there is no targeted therapy available -Due to his rapid declining of performance status, and  worsening liver function, he is not a candidate for more cancer treatment -I recommend home hospice.  Patient is familiar with hospice service which his mother used a few years ago.  He is open to hospice service -Will  try IR paracentesis for some symptom relief -His leukocytosis is likely reactive, no clinical suspicion for infection. -I sent a message to [facility] liaison  -I also discussed CODE STATUS and recommended DNR.  Patient wants some time to think about it. -I will follow-up as needed.  I spent a total of 50 minutes for his visit today, more than 50% time on face-to-face counseling.    [name], MD 11/27/2023  8:09 AM

## 2023-11-27 NOTE — TOC Initial Note (Signed)
 Transition of Care [facility]) - Initial/Assessment Note    Patient Details  Name: Steve Mills MRN: [id number] Date of Birth: 1966-08-08  [facility]) CM/SW Contact:    Beckie Busing, RN Phone Number:[phone]  11/27/2023, 10:57 AM  Clinical Narrative:                 [facility] acknowledges consult for patient with hospice referral. CM at bedside to offer choice. Patient and spouse are agreeable to [facility]. CM has made patient aware that CM will reach out hospital liaison for [facility] to follow up on referral. TOC will continue to follow.   Referral has been called to [facility] with [facility].   Expected Discharge Plan: Home w Hospice Care Barriers to Discharge: Continued Medical Work up   Patient Goals and CMS Choice Patient states their goals for this hospitalization and ongoing recovery are:: Wants to be able to go home CMS Medicare.gov Compare Post Acute Care list provided to:: Patient Choice offered to / list presented to : Patient, Spouse [place] ownership interest in [facility].provided to::  (n/a)    Expected Discharge Plan and Services In-house Referral: NA Discharge Planning Services: CM Consult Post Acute Care Choice: Hospice Living arrangements for the past 2 months: Single Family Home                 DME Arranged: N/A DME Agency: NA       HH Arranged: NA HH Agency:  ([facility]) Date HH Agency Contacted: 11/27/23 Time HH Agency Contacted: 1051 Representative spoke with at [facility] Agency: Henderson Newcomer LPN  Prior Living Arrangements/Services Living arrangements for the past 2 months: Single Family Home Lives with:: Spouse Patient language and need for interpreter reviewed:: Yes Do you feel safe going back to the place where you live?: Yes      Need for Family Participation in Patient Care: Yes (Comment) Care giver support system in place?: Yes (comment) Current home services:  (n/a) Criminal Activity/Legal  Involvement Pertinent to Current Situation/Hospitalization: No - Comment as needed  Activities of Daily Living   ADL Screening (condition at time of admission) Independently performs ADLs?: Yes (appropriate for developmental age) Is the patient deaf or have difficulty hearing?: No Does the patient have difficulty seeing, even when wearing glasses/contacts?: No Does the patient have difficulty concentrating, remembering, or making decisions?: No  Permission Sought/Granted Permission sought to share information with : Family Supports Permission granted to share information with : Yes, Verbal Permission Granted  Share Information with NAME: Steve Mills  Permission granted to share info w AGENCY: [facility]  Permission granted to share info w Relationship: spouse  Permission granted to share info w Contact Information: [phone]  Emotional Assessment Appearance:: Appears stated age Attitude/Demeanor/Rapport: Engaged Affect (typically observed): Tearful/Crying, Pleasant Orientation: : Oriented to Self, Oriented to Place, Oriented to  Time, Oriented to Situation Alcohol / Substance Use: Not Applicable Psych Involvement: No (comment)  Admission diagnosis:  Hyponatremia [E87.1] Primary pancreatic cancer with metastasis to other site [facility]) [C25.9] Nausea and vomiting, unspecified vomiting type [R11.2] Patient Active Problem List   Diagnosis Date Noted   Primary pancreatic cancer with metastasis to other site [facility]) 11/26/2023   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   Port-A-Cath in place 04/11/2022   Genetic testing 03/27/2022   Family history of breast cancer 03/08/2022   Family history of colon cancer 03/08/2022   Pancreatic cancer metastasized to liver (HCC) 03/02/2022   Pancreatic mass 02/27/2022  S/P laparoscopic cholecystectomy Nov 2019 07/12/2018   Cholecystitis with cholelithiasis 07/11/2018   Pain in right hip 10/13/2016   Incarcerated ventral  hernia 07/15/2014   Ventral hernia with obstruction 03/24/2014   Obesity, unspecified 03/24/2014   PCP:  [facility] [place] [facility]:   [id number] - [place], [place] - [address] [place] [address] Phone: [phone] Fax: [phone]     Social Drivers of Health (SDOH) Social History: SDOH Screenings   Food Insecurity: No Food Insecurity (11/26/2023)  Housing: Low Risk  (11/26/2023)  Transportation Needs: No Transportation Needs (11/26/2023)  Utilities: Not At Risk (11/26/2023)  Tobacco Use: Medium Risk (11/26/2023)   SDOH Interventions:     Readmission Risk Interventions     No data to display

## 2023-11-28 DIAGNOSIS — C259 Malignant neoplasm of pancreas, unspecified: Secondary | ICD-10-CM | POA: Diagnosis not present

## 2023-11-28 LAB — CBC WITH DIFFERENTIAL/PLATELET
Abs Immature Granulocytes: 1.73 10*3/uL — ABNORMAL HIGH (ref 0.00–0.07)
Basophils Absolute: 0.1 10*3/uL (ref 0.0–0.1)
Basophils Relative: 0 %
Eosinophils Absolute: 3.3 10*3/uL — ABNORMAL HIGH (ref 0.0–0.5)
Eosinophils Relative: 7 %
HCT: 24.3 % — ABNORMAL LOW (ref 39.0–52.0)
Hemoglobin: 7.3 g/dL — ABNORMAL LOW (ref 13.0–17.0)
Immature Granulocytes: 4 %
Lymphocytes Relative: 4 %
Lymphs Abs: 1.7 10*3/uL (ref 0.7–4.0)
MCH: 25.2 pg — ABNORMAL LOW (ref 26.0–34.0)
MCHC: 30 g/dL (ref 30.0–36.0)
MCV: 83.8 fL (ref 80.0–100.0)
Monocytes Absolute: 2.5 10*3/uL — ABNORMAL HIGH (ref 0.1–1.0)
Monocytes Relative: 6 %
Neutro Abs: 35.1 10*3/uL — ABNORMAL HIGH (ref 1.7–7.7)
Neutrophils Relative %: 79 %
Platelets: 242 10*3/uL (ref 150–400)
RBC: 2.9 MIL/uL — ABNORMAL LOW (ref 4.22–5.81)
RDW: 21.2 % — ABNORMAL HIGH (ref 11.5–15.5)
WBC: 44.5 10*3/uL — ABNORMAL HIGH (ref 4.0–10.5)
nRBC: 0 % (ref 0.0–0.2)

## 2023-11-28 LAB — COMPREHENSIVE METABOLIC PANEL WITH GFR
ALT: 23 U/L (ref 0–44)
AST: 39 U/L (ref 15–41)
Albumin: 1.6 g/dL — ABNORMAL LOW (ref 3.5–5.0)
Alkaline Phosphatase: 367 U/L — ABNORMAL HIGH (ref 38–126)
Anion gap: 8 (ref 5–15)
BUN: 16 mg/dL (ref 6–20)
CO2: 20 mmol/L — ABNORMAL LOW (ref 22–32)
Calcium: 7 mg/dL — ABNORMAL LOW (ref 8.9–10.3)
Chloride: 94 mmol/L — ABNORMAL LOW (ref 98–111)
Creatinine, Ser: 0.79 mg/dL (ref 0.61–1.24)
GFR, Estimated: >60 mL/min
Glucose, Bld: 95 mg/dL (ref 70–99)
Potassium: 4.3 mmol/L (ref 3.5–5.1)
Sodium: 122 mmol/L — ABNORMAL LOW (ref 135–145)
Total Bilirubin: 3.2 mg/dL — ABNORMAL HIGH (ref 0.0–1.2)
Total Protein: 4.8 g/dL — ABNORMAL LOW (ref 6.5–8.1)

## 2023-11-28 LAB — MAGNESIUM: Magnesium: 2 mg/dL (ref 1.7–2.4)

## 2023-11-28 MED ORDER — SIMETHICONE 80 MG PO CHEW
160.0000 mg | CHEWABLE_TABLET | Freq: Four times a day (QID) | ORAL | Status: DC | PRN
  Administered 2023-11-28 – 2023-11-29 (×2): 160 mg via ORAL
  Filled 2023-11-28 (×2): qty 2

## 2023-11-28 MED ORDER — SODIUM CHLORIDE 1 G PO TABS
1.0000 g | ORAL_TABLET | Freq: Three times a day (TID) | ORAL | Status: DC
Start: 2023-11-28 — End: 2023-11-29
  Administered 2023-11-28 – 2023-11-29 (×4): 1 g via ORAL
  Filled 2023-11-28 (×4): qty 1

## 2023-11-28 MED ORDER — ENSURE ENLIVE PO LIQD
237.0000 mL | Freq: Two times a day (BID) | ORAL | Status: DC
  Administered 2023-11-28 – 2023-11-29 (×2): 237 mL via ORAL

## 2023-11-28 MED ORDER — CALCIUM CARBONATE ANTACID 500 MG PO CHEW
2.0000 | CHEWABLE_TABLET | Freq: Two times a day (BID) | ORAL | Status: DC | PRN
  Administered 2023-11-28 – 2023-11-29 (×2): 400 mg via ORAL
  Filled 2023-11-28 (×2): qty 2

## 2023-11-28 NOTE — Progress Notes (Signed)
 Initial Nutrition Assessment  DOCUMENTATION CODES:   Non-severe (moderate) malnutrition in context of chronic illness  INTERVENTION:   -Ensure Plus High Protein po BID, each supplement provides 350 kcal and 20 grams of protein. Can add milk to lessen sweetness if needed.  -Encourage PO as tolerated  NUTRITION DIAGNOSIS:   Moderate Malnutrition related to chronic illness, cancer and cancer related treatments as evidenced by moderate fat depletion, moderate muscle depletion, energy intake < or equal to 75% for > or equal to 1 month.  GOAL:   Patient will meet greater than or equal to 90% of their needs  MONITOR:   PO intake, Supplement acceptance  REASON FOR ASSESSMENT:   Consult Assessment of nutrition requirement/status  ASSESSMENT:   58 yrs old male with medical history significant of pancreatic cancer with metastasis to liver on chemotherapy as an outpatient, chronic anemia, obesity, s/p cholecystectomy (2019) presented with worsening oral intake and vomiting.  Patient in room, wife at bedside. Pt laying in bed, seems fatigued. States he is tolerating food so far today. Had some jello, eggs and a muffin.  PTA pt was not tolerating PO well, reports he would be vomiting hours after meals.  Currently trying to improve his sodium levels. Willing to try protein shakes to help intakes. Reports he doesn't want anything that is too sweet so RD suggested adding milk to lessen sweetness. Pt denies any issues with swallowing and chewing. Reports foods taste normal.  Per oncology note pt not a candidate for more cancer treatment. Recommended home with hospice, but pt declines, remains full code.   Per weight records, pt has lost 44 lbs since 12/04/22 (16% wt loss x 1 year, insignificant for time frame).   Medications: Creon, sodium chloride tablets  Labs reviewed: Low Na -trending down Low iron Elevated Ferritin   NUTRITION - FOCUSED PHYSICAL EXAM:  Flowsheet Row Most Recent  Value  Orbital Region Moderate depletion  Upper Arm Region Mild depletion  Thoracic and Lumbar Region Moderate depletion  Buccal Region Moderate depletion  Temple Region Moderate depletion  Clavicle Bone Region Moderate depletion  Clavicle and Acromion Bone Region Moderate depletion  Scapular Bone Region Moderate depletion  Dorsal Hand Moderate depletion  Patellar Region Moderate depletion  Anterior Thigh Region Moderate depletion  Posterior Calf Region Moderate depletion  Edema (RD Assessment) Mild  Hair Unable to assess  [no hair]  Eyes Reviewed  Mouth Reviewed  Skin Reviewed  [pale]  Nails Reviewed       Diet Order:   Diet Order             Diet regular Room service appropriate? Yes; Fluid consistency: Thin  Diet effective now                   EDUCATION NEEDS:   No education needs have been identified at this time  Skin:  Skin Assessment: Reviewed RN Assessment  Last BM:  4/7  Height:   Ht Readings from Last 1 Encounters:  11/28/23 6' (1.829 m)    Weight:   Wt Readings from Last 1 Encounters:  11/28/23 98 kg    BMI:  Body mass index is 29.3 kg/m.  Estimated Nutritional Needs:   Kcal:  6578-4696  Protein:  115-125g  Fluid:  2L/day   Tilda Franco, MS, RD, LDN Inpatient Clinical Dietitian Contact via Secure chat

## 2023-11-28 NOTE — Progress Notes (Addendum)
 Steve Mills   DOB:[birth year]   ZO#:[id number]   [id number]#:[id number]  Medical oncology follow-up note  Subjective: Patient's symptoms fluctuate.  He felt well this morning, and ate breakfast and lunch well.  He started feeling bloated and nauseated again this afternoon, he did try to sit in the recliner, but feel weak and laid down again.   Objective:  Vitals:   11/28/23 0539 11/28/23 1346  BP: 102/74 102/74  Pulse: 87 87  Resp: 14 14  Temp: 97.8 F (36.6 C) 97.8 F (36.6 C)  SpO2: 96%     Body mass index is 29.3 kg/m.  Intake/Output Summary (Last 24 hours) at 11/28/2023 1743 Last data filed at 11/28/2023 1000 Gross per 24 hour  Intake 240 ml  Output --  Net 240 ml     Sclerae unicteric, pale appearance  Oropharynx clear  No peripheral adenopathy  Lungs clear -- no rales or rhonchi  Heart regular rate and rhythm  Abdomen soft, distended, positive fluid sign  MSK no focal spinal tenderness, no peripheral edema  Neuro nonfocal   CBG (last 3)  No results for input(s): "GLUCAP" in the last 72 hours.   Labs:  Lab Results  Component Value Date   WBC 44.5 (H) 11/28/2023   HGB 7.3 (L) 11/28/2023   HCT 24.3 (L) 11/28/2023   MCV 83.8 11/28/2023   PLT 242 11/28/2023   NEUTROABS 35.1 (H) 11/28/2023     Urine Studies No results for input(s): "UHGB", "CRYS" in the last 72 hours.  Invalid input(s): "UACOL", "UAPR", "USPG", "UPH", "UTP", "UGL", "UKET", "UBIL", "UNIT", "UROB", "ULEU", "UEPI", "UWBC", "URBC", "UBAC", "CAST", "UCOM", "BILUA"  Basic Metabolic Panel: Recent Labs  Lab 11/26/23 1255 11/27/23 0554 11/28/23 0827  NA 127* 126* 122*  K 4.2 4.2 4.3  CL 96* 95* 94*  CO2 20* 22 20*  GLUCOSE 115* 110* 95  BUN 15 18 16   CREATININE 0.94 0.97 0.79  CALCIUM 7.6* 7.4* 7.0*  MG  --   --  2.0   GFR Estimated Creatinine Clearance: 122.1 mL/min (by C-G formula based on SCr of 0.79 mg/dL). Liver Function Tests: Recent Labs  Lab 11/26/23 1255 11/27/23 0554  11/28/23 0827  AST 29 33 39  ALT 24 23 23   ALKPHOS 382* 362* 367*  BILITOT 1.9* 2.3* 3.2*  PROT 5.9* 5.5* 4.8*  ALBUMIN 1.9* 1.8* 1.6*   Recent Labs  Lab 11/26/23 1255  LIPASE 155*   No results for input(s): "AMMONIA" in the last 168 hours. Coagulation profile Recent Labs  Lab 11/27/23 0554  INR 1.5*    CBC: Recent Labs  Lab 11/26/23 1255 11/27/23 0554 11/28/23 0827  WBC 48.7* 46.7* 44.5*  NEUTROABS  --   --  35.1*  HGB 8.4* 8.5* 7.3*  HCT 27.8* 27.7* 24.3*  MCV 82.2 83.2 83.8  PLT 325 278 242   Cardiac Enzymes: No results for input(s): "CKTOTAL", "CKMB", "CKMBINDEX", "TROPONINI" in the last 168 hours. BNP: Invalid input(s): "POCBNP" CBG: No results for input(s): "GLUCAP" in the last 168 hours. D-Dimer No results for input(s): "DDIMER" in the last 72 hours. Hgb A1c No results for input(s): "HGBA1C" in the last 72 hours. Lipid Profile No results for input(s): "CHOL", "HDL", "LDLCALC", "TRIG", "CHOLHDL", "LDLDIRECT" in the last 72 hours. Thyroid function studies No results for input(s): "TSH", "T4TOTAL", "T3FREE", "THYROIDAB" in the last 72 hours.  Invalid input(s): "FREET3" Anemia work up Recent Labs    11/27/23 0554  FERRITIN 1,093*  TIBC 182*  IRON 29*  Microbiology No results found for this or any previous visit (from the past 240 hours).    Studies:  US Abdomen Limited Result Date: 11/27/2023 CLINICAL DATA:  Patient with history of metastatic pancreatic cancer, ascites on prior imaging. Request received for therapeutic paracentesis. EXAM: LIMITED ABDOMEN ULTRASOUND FOR ASCITES TECHNIQUE: Limited ultrasound survey for ascites was performed in all four abdominal quadrants. There is only a small amount of ascites noted in the perihepatic region on limited ultrasound in all 4 quadrants today. COMPARISON:  CT abdomen and pelvis on 11/26/2023 FINDINGS: Small amount of ascites noted in perihepatic region. Paracentesis not performed today. IMPRESSION:  Small volume of perihepatic ascites. Therapeutic paracentesis was not performed. Performed by: Artemio Aly Electronically Signed   By: Roanna Banning M.D.   On: 11/27/2023 15:42    Assessment: 58 y.o. male   Metastatic pancreatic cancer New onset ascites Failure to thrive Elevated liver enzymes and hyperbilirubinemia Anemia of chronic disease Leukocytosis Worsening hyponatremia     Plan:  -Patient has met hospice liaison.  CODE STATUS discussed, patient agrees no ICU transfer, no ventilation, but does want to be resuscitated with chest compression, shock etc.  I explained to him that this may not be enough to get him back in the event of cardiac arrest. But that's what he wants. Will check with hospice team to see if they are okay with limited DNR. -He otherwise would like to go home with hospice.  We again reviewed the logistics of hospice, voiced good understanding.  His mother was under hospice care before she passed away. -I will f/u as needed.     [name], MD 11/28/2023  5:43 PM

## 2023-11-28 NOTE — Progress Notes (Signed)
 PT Cancellation Note  Patient Details Name: Steve Mills MRN: [id number] DOB: 03/28/1966   Cancelled Treatment:    Reason Eval/Treat Not Completed: PT screened, no needs identified, will sign off. Pt politely declines PT eval, reports not sleeping well at night due to hospital distractions/noise. Politely declines PT eval upon therapist's entry into room at 11:59 and declines return for PT eval. Pt reports going home tomorrow and declines needs. Will sign off at this time, please re-consult if needs arise.   Domenick Bookbinder PT, DPT 11/28/23, 12:02 PM

## 2023-11-28 NOTE — Plan of Care (Signed)

## 2023-11-28 NOTE — Plan of Care (Signed)

## 2023-11-28 NOTE — Progress Notes (Signed)
 PROGRESS NOTE    Steve Mills  ZOX:[id number] DOB: [birth year] DOA: 11/26/2023 PCP: [facility] [place]   Brief Narrative:  This 58 yrs old male with medical history significant of pancreatic cancer with metastasis to liver on chemotherapy as an outpatient, chronic anemia, obesity, s/p cholecystectomy (2019) presented with worsening oral intake and vomiting.  On presentation, WBC was 48.7, alkaline phosphatase 382, albumin 1.9, total bilirubin of 1.9 and lipase of 155.  UA was negative for infection.  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 CT imaging also showing small-to-moderate ascites, increased since prior study. Lastly, CT showing solid noncalcified left lower lobe lung nodule concerning for metastases (similar to prior study). Oncology consulted by EDP. He was admitted for further evaluation.  Assessment & Plan:   Principal Problem:   Primary pancreatic cancer with metastasis to other site [facility])   Failure to thrive: Pancreatic cancer with metastasis to liver and possibly lung: Peritoneal carcinomatosis and metastatic lymphadenopathy with small to moderate ascites: Anasarca associated with hypoalbuminemia: Patient presented with decreased oral intake and vomiting. 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 He follows up with Dr. Mosetta Putt [name] as an outpatient:  FOLFOX currently on hold due to significant adverse effects. Follow oncology recommendations. Continue as needed antiemetics.  Advance diet as tolerated Follow nutrition recommendations. IR consulted patient does not have enough ascitis to be drained.   Anemia of chronic disease: Likely due to cancer and chemotherapy.    Hemoglobin stable.  No signs of bleeding.  Monitor   Leukocytosis: WBC still elevated at 46.7.   Possibly from metastatic disease and/or antineoplastic therapy No signs of infection.  Afebrile.  Monitor   Hyponatremia: Sodium 127 on presentation.  Given IV fluids in the ED.   Sodium dropped to 122 .  Start salt tablets.   GERD: Continue Protonix.  Goals of care discussion Patient does not want to be DNR and stated he wants full code. Patient refuses hospice.  Dr. Mosetta Putt will see him in the afternoon.   DVT prophylaxis: Eliquis Code Status: Full code Family Communication:Wife at bed side. Disposition Plan:    Status is: Observation The patient remains OBS appropriate and will d/c before 2 midnights.   Admitted for failure to thrive, worsening metastatic disease, ascites requiring paracentesis.  Consultants:  Oncology  Procedures: None  Antimicrobials:  Anti-infectives (From admission, onward)    None      Subjective: Patient was seen and examined at bedside.  Overnight events noted. Patient seems improved.  He reports abdominal pain is better,  he was tolerating food.  Objective: Vitals:   11/27/23 0903 11/27/23 1354 11/27/23 2103 11/28/23 0539  BP: 109/79 96/69 91/62  102/74  Pulse: 92 97 80 87  Resp: 15 17 14 14   Temp: 98.2 F (36.8 C) 97.7 F (36.5 C) 98.3 F (36.8 C) 97.8 F (36.6 C)  TempSrc: Oral Oral Oral Oral  SpO2: 92% 96% 96% 96%  Weight:        Intake/Output Summary (Last 24 hours) at 11/28/2023 1145 Last data filed at 11/27/2023 1528 Gross per 24 hour  Intake 111.48 ml  Output --  Net 111.48 ml   Filed Weights   11/27/23 0400  Weight: 98.5 kg    Examination:  General exam: Appears calm and comfortable, deconditioned, not in any acute distress. Respiratory  system: Clear to auscultation. Respiratory effort normal.  RR 16 Cardiovascular system: S1 & S2 heard, RRR. No JVD, murmurs, rubs, gallops or clicks.  Gastrointestinal system:  Abdomen is distended, soft and Mildly tender.  Normal bowel sounds heard. Central nervous system: Alert and oriented x 3. No focal neurological deficits. Extremities: Edema + no cyanosis, no clubbing, Skin: No rashes, lesions or ulcers Psychiatry: Judgement and insight appear normal. Mood & affect appropriate.     Data Reviewed: I have personally reviewed following labs and imaging studies  CBC: Recent Labs  Lab 11/26/23 1255 11/27/23 0554 11/28/23 0827  WBC 48.7* 46.7* 44.5*  NEUTROABS  --   --  35.1*  HGB 8.4* 8.5* 7.3*  HCT 27.8* 27.7* 24.3*  MCV 82.2 83.2 83.8  PLT 325 278 242   Basic Metabolic Panel: Recent Labs  Lab 11/26/23 1255 11/27/23 0554 11/28/23 0827  NA 127* 126* 122*  K 4.2 4.2 4.3  CL 96* 95* 94*  CO2 20* 22 20*  GLUCOSE 115* 110* 95  BUN 15 18 16   CREATININE 0.94 0.97 0.79  CALCIUM 7.6* 7.4* 7.0*  MG  --   --  2.0   GFR: Estimated Creatinine Clearance: 122.4 mL/min (by C-G formula based on SCr of 0.79 mg/dL). Liver Function Tests: Recent Labs  Lab 11/26/23 1255 11/27/23 0554 11/28/23 0827  AST 29 33 39  ALT 24 23 23   ALKPHOS 382* 362* 367*  BILITOT 1.9* 2.3* 3.2*  PROT 5.9* 5.5* 4.8*  ALBUMIN 1.9* 1.8* 1.6*   Recent Labs  Lab 11/26/23 1255  LIPASE 155*   No results for input(s): "AMMONIA" in the last 168 hours. Coagulation Profile: Recent Labs  Lab 11/27/23 0554  INR 1.5*   Cardiac Enzymes: No results for input(s): "CKTOTAL", "CKMB", "CKMBINDEX", "TROPONINI" in the last 168 hours. BNP (last 3 results) No results for input(s): "PROBNP" in the last 8760 hours. HbA1C: No results for input(s): "HGBA1C" in the last 72 hours. CBG: No results for input(s): "GLUCAP" in the last 168 hours. Lipid Profile: No results for input(s): "CHOL", "HDL", "LDLCALC", "TRIG", "CHOLHDL", "LDLDIRECT" in the last 72 hours. Thyroid Function Tests: No results for input(s): "TSH", "T4TOTAL", "FREET4", "T3FREE", "THYROIDAB" in the last 72 hours. Anemia  Panel: Recent Labs    11/27/23 0554  FERRITIN 1,093*  TIBC 182*  IRON 29*   Sepsis Labs: No results for input(s): "PROCALCITON", "LATICACIDVEN" in the last 168 hours.  No results found for this or any previous visit (from the past 240 hours).   Radiology Studies: US Abdomen Limited Result Date: 11/27/2023 CLINICAL DATA:  Patient with history of metastatic pancreatic cancer, ascites on prior imaging. Request received for therapeutic paracentesis. EXAM: LIMITED ABDOMEN ULTRASOUND FOR ASCITES TECHNIQUE: Limited ultrasound survey for ascites was performed in all four abdominal quadrants. There is only a small amount of ascites noted in the perihepatic region on limited ultrasound in all 4 quadrants today. COMPARISON:  CT abdomen and pelvis on 11/26/2023 FINDINGS: Small amount of ascites noted in perihepatic region. Paracentesis not performed today. IMPRESSION: Small volume of perihepatic ascites. Therapeutic paracentesis was not performed. Performed by: Artemio Aly Electronically Signed   By: Roanna Banning M.D.   On: 11/27/2023 15:42   CT ABDOMEN PELVIS W CONTRAST Result Date: 11/26/2023 CLINICAL DATA:  Bowel obstruction suspected. Emesis. History of pancreatic cancer. * Tracking Code: BO * EXAM: CT ABDOMEN AND PELVIS WITH CONTRAST TECHNIQUE: Multidetector CT imaging of the abdomen and pelvis was performed using the standard protocol following bolus administration  of intravenous contrast. RADIATION DOSE REDUCTION: This exam was performed according to the departmental dose-optimization program which includes automated exposure control, adjustment of the mA and/or kV according to patient size and/or use of iterative reconstruction technique. CONTRAST:  OMNIPAQUE IOHEXOL 300 MG/ML  SOLN COMPARISON:  CT scan abdomen and pelvis from 11/15/2023. FINDINGS: Lower chest: There is a 3 x 4 mm solid noncalcified nodule in the left lung lower lobe (series 6, image 29), grossly similar to the prior study  but concerning for metastases. There are patchy atelectatic changes in the visualized lung bases. No overt consolidation. No pleural effusion. The heart is normal in size. No pericardial effusion. Hepatobiliary: The liver is normal in size. Non-cirrhotic configuration. Redemonstration of extensive ill-defined hypoattenuating masses throughout the liver, compatible with metastasis. When compared to the recent prior exam, no significant interval change. No intrahepatic bile duct dilation. There is mild prominence of the extrahepatic bile duct, most likely due to post cholecystectomy status. Gallbladder is surgically absent. Pancreas: Redemonstration of patient's known heterogeneous hypoattenuating mass centered in the pancreatic head/uncinate process region, grossly similar to the prior study. Spleen: Within normal limits. No focal lesion. Adrenals/Urinary Tract: Adrenal glands are unremarkable. No suspicious renal mass. Multiple bilateral renal sinus cysts again noted. No hydronephrosis. No renal or ureteric calculi. Unremarkable urinary bladder. Stomach/Bowel: No disproportionate dilation of the small or large bowel loops. No evidence of abnormal bowel wall thickening or inflammatory changes. The appendix was not visualized; however there is no acute inflammatory process in the right lower quadrant. Vascular/Lymphatic: There is small-to-moderate ascites, increased since the prior study. No pneumoperitoneum. Redemonstration of extensive enlarged/heterogeneous lymphadenopathy with largest in the portacaval region measuring up to 3.4 x 5.1 cm, grossly similar to the prior study. Redemonstration of irregular soft tissue attenuation nodules mainly in the right abdomen, compatible with peritoneal carcinomatosis no significant interval change since the prior study. No aneurysmal dilation of the major abdominal arteries. There are mild peripheral atherosclerotic vascular calcifications of the aorta and its major branches.  Reproductive: Normal size prostate. Symmetric seminal vesicles. Other: There are fat containing umbilical and bilateral inguinal hernias. The soft tissues and abdominal wall are otherwise unremarkable. Musculoskeletal: Stable subcentimeter sized sclerotic foci in the left ischium. There are mild multilevel degenerative changes in the visualized spine. IMPRESSION: 1. No bowel obstruction. 2. Redemonstration of patient's known pancreatic head/uncinate process mass, extensive hepatic metastases, peritoneal carcinomatosis and metastatic lymphadenopathy, as described in detail above. No significant interval change since the recent prior study. 3. Small-to-moderate ascites, increased since the prior study. 4. There is a 3 x 4 mm solid noncalcified nodule in the left lung lower lobe, grossly similar to the prior study, concerning for metastases. 5. Multiple other nonacute observations, as described above. Aortic Atherosclerosis (ICD10-I70.0). Electronically Signed   By: Jules Schick M.D.   On: 11/26/2023 16:25   Scheduled Meds:  apixaban  5 mg Oral BID   feeding supplement  237 mL Oral BID BM   lipase/protease/amylase  72,000 Units Oral TID WC   pantoprazole  40 mg Oral BID   sodium chloride  1 g Oral TID WC   Continuous Infusions:  sodium chloride 75 mL/hr at 11/28/23 0309     LOS: 0 days    Time spent: 50 mins    Willeen Niece, MD [facility]   If 7PM-7AM, please contact night-coverage

## 2023-11-29 DIAGNOSIS — C7802 Secondary malignant neoplasm of left lung: Secondary | ICD-10-CM | POA: Diagnosis not present

## 2023-11-29 DIAGNOSIS — Z7401 Bed confinement status: Secondary | ICD-10-CM | POA: Diagnosis not present

## 2023-11-29 DIAGNOSIS — E669 Obesity, unspecified: Secondary | ICD-10-CM | POA: Diagnosis not present

## 2023-11-29 DIAGNOSIS — D649 Anemia, unspecified: Secondary | ICD-10-CM | POA: Diagnosis not present

## 2023-11-29 DIAGNOSIS — Z9221 Personal history of antineoplastic chemotherapy: Secondary | ICD-10-CM | POA: Diagnosis not present

## 2023-11-29 DIAGNOSIS — Z86711 Personal history of pulmonary embolism: Secondary | ICD-10-CM | POA: Diagnosis not present

## 2023-11-29 DIAGNOSIS — C779 Secondary and unspecified malignant neoplasm of lymph node, unspecified: Secondary | ICD-10-CM | POA: Diagnosis not present

## 2023-11-29 DIAGNOSIS — C787 Secondary malignant neoplasm of liver and intrahepatic bile duct: Secondary | ICD-10-CM | POA: Diagnosis not present

## 2023-11-29 DIAGNOSIS — E440 Moderate protein-calorie malnutrition: Secondary | ICD-10-CM | POA: Insufficient documentation

## 2023-11-29 DIAGNOSIS — K219 Gastro-esophageal reflux disease without esophagitis: Secondary | ICD-10-CM | POA: Diagnosis not present

## 2023-11-29 DIAGNOSIS — R180 Malignant ascites: Secondary | ICD-10-CM | POA: Diagnosis not present

## 2023-11-29 DIAGNOSIS — I951 Orthostatic hypotension: Secondary | ICD-10-CM | POA: Diagnosis not present

## 2023-11-29 DIAGNOSIS — R112 Nausea with vomiting, unspecified: Secondary | ICD-10-CM | POA: Diagnosis not present

## 2023-11-29 DIAGNOSIS — C259 Malignant neoplasm of pancreas, unspecified: Secondary | ICD-10-CM | POA: Diagnosis not present

## 2023-11-29 DIAGNOSIS — R531 Weakness: Secondary | ICD-10-CM | POA: Diagnosis not present

## 2023-11-29 LAB — CBC
HCT: 25.2 % — ABNORMAL LOW (ref 39.0–52.0)
Hemoglobin: 7.5 g/dL — ABNORMAL LOW (ref 13.0–17.0)
MCH: 25.6 pg — ABNORMAL LOW (ref 26.0–34.0)
MCHC: 29.8 g/dL — ABNORMAL LOW (ref 30.0–36.0)
MCV: 86 fL (ref 80.0–100.0)
Platelets: 265 10*3/uL (ref 150–400)
RBC: 2.93 MIL/uL — ABNORMAL LOW (ref 4.22–5.81)
RDW: 21.9 % — ABNORMAL HIGH (ref 11.5–15.5)
WBC: 47.4 10*3/uL — ABNORMAL HIGH (ref 4.0–10.5)
nRBC: 0.1 % (ref 0.0–0.2)

## 2023-11-29 LAB — BASIC METABOLIC PANEL WITH GFR
Anion gap: 11 (ref 5–15)
BUN: 19 mg/dL (ref 6–20)
CO2: 19 mmol/L — ABNORMAL LOW (ref 22–32)
Calcium: 7.4 mg/dL — ABNORMAL LOW (ref 8.9–10.3)
Chloride: 97 mmol/L — ABNORMAL LOW (ref 98–111)
Creatinine, Ser: 0.92 mg/dL (ref 0.61–1.24)
GFR, Estimated: >60 mL/min (ref >60)
Glucose, Bld: 139 mg/dL — ABNORMAL HIGH (ref 70–99)
Potassium: 4.3 mmol/L (ref 3.5–5.1)
Sodium: 127 mmol/L — ABNORMAL LOW (ref 135–145)

## 2023-11-29 LAB — PHOSPHORUS: Phosphorus: 2.4 mg/dL — ABNORMAL LOW (ref 2.5–4.6)

## 2023-11-29 LAB — MAGNESIUM: Magnesium: 2.4 mg/dL (ref 1.7–2.4)

## 2023-11-29 MED ORDER — SIMETHICONE 80 MG PO CHEW: 160.0000 mg | CHEWABLE_TABLET | Freq: Four times a day (QID) | ORAL | 0 refills | Status: DC | PRN

## 2023-11-29 MED ORDER — SODIUM CHLORIDE 1 G PO TABS: 1.0000 g | ORAL_TABLET | Freq: Three times a day (TID) | ORAL | 0 refills | Status: AC

## 2023-11-29 MED ORDER — ONDANSETRON HCL 4 MG PO TABS: 4.0000 mg | ORAL_TABLET | Freq: Four times a day (QID) | ORAL | 0 refills | Status: DC | PRN

## 2023-11-29 MED ORDER — PANTOPRAZOLE SODIUM 40 MG PO TBEC: 40.0000 mg | DELAYED_RELEASE_TABLET | Freq: Two times a day (BID) | ORAL | 0 refills | Status: DC

## 2023-11-29 MED ORDER — CALCIUM CARBONATE ANTACID 500 MG PO CHEW: 2.0000 | CHEWABLE_TABLET | Freq: Two times a day (BID) | ORAL | 0 refills | Status: DC | PRN

## 2023-11-29 NOTE — Plan of Care (Signed)

## 2023-11-29 NOTE — Plan of Care (Signed)

## 2023-11-29 NOTE — Discharge Summary (Signed)
 Physician Discharge Summary  Steve Mills HYQ:[id number] DOB: 11/20/1965 DOA: 11/26/2023  PCP: [facility] [place]  Admit date: 11/26/2023  Discharge date: 11/29/2023  Admitted From: Home.  Disposition:  Residential Hospice  Recommendations for Outpatient Follow-up:  Follow up with PCP in 1-2 weeks. Please obtain BMP/CBC in one week. Patient is being discharged home with hospice. Advised to follow-up with Dr. Mosetta Putt as scheduled. Advised to take sodium chloride tablets 1 g 3 times daily for next few days.  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  On presentation, WBC was 48.7, alkaline phosphatase 382, albumin 1.9, total bilirubin of 1.9 and lipase of 155.  UA was negative for infection.  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 CT imaging also showing small-to-moderate ascites, increased since prior study. Lastly, CT showing solid noncalcified left lower lobe lung nodule concerning for metastases (similar to prior study).Oncology consulted by EDP. He was admitted for further evaluation.  IR was consulted for paracentesis but the ascites was minimal so far no paracentesis done.Oncology states patient has advanced disease, recommended home with home hospice.  Patient wants to be discharged home,  oxygen arranged.  Patient being discharged home with Hospice.   Discharge Diagnoses:  Principal Problem:   Primary pancreatic  cancer with metastasis to other site [facility]) Active Problems:   Malnutrition of moderate degree  Failure to thrive: Pancreatic cancer with metastasis to liver and possibly lung: Peritoneal carcinomatosis and metastatic lymphadenopathy with small to moderate ascites: Anasarca associated with hypoalbuminemia: Patient presented with decreased oral intake and vomiting. 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 He follows up with Dr. Mosetta Putt Steve Mills as an outpatient:  FOLFOX currently on hold due to significant adverse effects. Follow oncology recommendations. Continue as needed antiemetics.  Advance diet as tolerated Follow nutrition recommendations. IR consulted patient does not have enough ascitis to be drained.   Anemia of chronic disease: Likely due to cancer and chemotherapy.   Hemoglobin stable.  No signs of bleeding.  Monitor   Leukocytosis: WBC still elevated at 46.7.   Possibly from metastatic disease and/or antineoplastic therapy No signs of infection.  Afebrile.  Monitor   Hyponatremia: Sodium 127 on presentation.  Given IV fluids in the ED.   Sodium dropped to 122  now improved to 127.  Continue salt tablets.   GERD: Continue Protonix.   Goals of care discussion Patient does not want to be DNR and stated he wants full code. Patient refuses hospice.  Dr. Mosetta Putt will see him in the afternoon.  Discharge Instructions  Discharge Instructions     Call MD for:  difficulty breathing, headache or visual disturbances   Complete by: As directed    Call MD for:  persistant dizziness or light-headedness   Complete by: As directed    Call MD for:  persistant nausea and vomiting   Complete by: As directed    Diet - low sodium heart healthy   Complete by: As directed    Diet Carb Modified   Complete by: As directed  Discharge instructions   Complete by: As directed     Advised to follow-up with primary care physician in 1 week. Patient being discharged home with hospice. Advised to follow-up with Dr. Blake Divine as scheduled. Advised to take sodium chloride tablets 1 g 3 times daily for next few days.   Increase activity slowly   Complete by: As directed       Allergies as of 11/29/2023   No Known Allergies      Medication List     STOP taking these medications    valACYclovir 500 MG tablet Commonly known as: VALTREX       TAKE these medications    apixaban 5 MG Tabs tablet Commonly known as: ELIQUIS Take 1 tablet (5 mg total) by mouth 2 (two) times daily.   calcium carbonate 500 MG chewable tablet Commonly known as: TUMS - dosed in mg elemental calcium Chew 2 tablets (400 mg of elemental calcium total) by mouth 2 (two) times daily as needed for indigestion or heartburn.   cyanocobalamin 1000 MCG tablet Commonly known as: VITAMIN B12 Take 2,000 mcg by mouth daily.   lidocaine-prilocaine cream Commonly known as: EMLA Apply 1 Application topically as needed. What changed: reasons to take this   lipase/protease/amylase 16109 UNITS Cpep capsule Commonly known as: Creon Take 2 capsules (72,000 Units total) by mouth 3 (three) times daily with meals. May also take 1 capsule (36,000 Units total) as needed (with snacks).   ondansetron 4 MG tablet Commonly known as: ZOFRAN Take 1 tablet (4 mg total) by mouth every 6 (six) hours as needed for nausea.   oxyCODONE 5 MG immediate release tablet Commonly known as: Oxy IR/ROXICODONE Take 5 mg by mouth every 4 (four) hours as needed (for pain).   pantoprazole 40 MG tablet Commonly known as: PROTONIX Take 1 tablet (40 mg total) by mouth 2 (two) times daily.   potassium chloride SA 20 MEQ tablet Commonly known as: Klor-Con M20 Take 1 tablet (20 mEq total) by mouth 2 (two) times daily.   simethicone 80 MG chewable tablet Commonly known as: MYLICON Chew 2 tablets (160 mg total) by mouth every 6  (six) hours as needed for flatulence.   sodium chloride 1 g tablet Take 1 tablet (1 g total) by mouth 3 (three) times daily with meals for 5 days.               Durable Medical Equipment  (From admission, onward)           Start     Ordered   11/29/23 1024  DME Oxygen  Once       Question Answer Comment  Length of Need Lifetime   Mode or (Route) Nasal cannula   Liters per Minute 2   Frequency Continuous (stationary and portable oxygen unit needed)   Oxygen conserving device Yes   Oxygen delivery system Gas      11/29/23 1023            Follow-up Information     [facility] [place] Follow up in 1 week(s).   Specialty: Family Medicine Contact information: [address] [place] [address] [phone]         [name], MD Follow up in 1 week(s).   Specialties: Hematology, Oncology Contact information: [address] [place] [address] [phone]                No Known Allergies  Consultations: Oncology   Procedures/Studies:  US Abdomen Limited Result Date: 11/27/2023 CLINICAL DATA:  Patient with history of metastatic pancreatic cancer, ascites on prior imaging. Request received for therapeutic paracentesis. EXAM: LIMITED ABDOMEN ULTRASOUND FOR ASCITES TECHNIQUE: Limited ultrasound survey for ascites was performed in all four abdominal quadrants. There is only a small amount of ascites noted in the perihepatic region on limited ultrasound in all 4 quadrants today. COMPARISON:  CT abdomen and pelvis on 11/26/2023 FINDINGS: Small amount of ascites noted in perihepatic region. Paracentesis not performed today. IMPRESSION: Small volume of perihepatic ascites. Therapeutic paracentesis was not performed. Performed by: Artemio Aly Electronically Signed   By: Roanna Banning M.D.   On: 11/27/2023 15:42   CT ABDOMEN PELVIS W CONTRAST Result Date: 11/26/2023 CLINICAL DATA:  Bowel obstruction suspected. Emesis.  History of pancreatic cancer. * Tracking Code: BO * EXAM: CT ABDOMEN AND PELVIS WITH CONTRAST TECHNIQUE: Multidetector CT imaging of the abdomen and pelvis was performed using the standard protocol following bolus administration of intravenous contrast. RADIATION DOSE REDUCTION: This exam was performed according to the departmental dose-optimization program which includes automated exposure control, adjustment of the mA and/or kV according to patient size and/or use of iterative reconstruction technique. CONTRAST:  OMNIPAQUE IOHEXOL 300 MG/ML  SOLN COMPARISON:  CT scan abdomen and pelvis from 11/15/2023. FINDINGS: Lower chest: There is a 3 x 4 mm solid noncalcified nodule in the left lung lower lobe (series 6, image 29), grossly similar to the prior study but concerning for metastases. There are patchy atelectatic changes in the visualized lung bases. No overt consolidation. No pleural effusion. The heart is normal in size. No pericardial effusion. Hepatobiliary: The liver is normal in size. Non-cirrhotic configuration. Redemonstration of extensive ill-defined hypoattenuating masses throughout the liver, compatible with metastasis. When compared to the recent prior exam, no significant interval change. No intrahepatic bile duct dilation. There is mild prominence of the extrahepatic bile duct, most likely due to post cholecystectomy status. Gallbladder is surgically absent. Pancreas: Redemonstration of patient's known heterogeneous hypoattenuating mass centered in the pancreatic head/uncinate process region, grossly similar to the prior study. Spleen: Within normal limits. No focal lesion. Adrenals/Urinary Tract: Adrenal glands are unremarkable. No suspicious renal mass. Multiple bilateral renal sinus cysts again noted. No hydronephrosis. No renal or ureteric calculi. Unremarkable urinary bladder. Stomach/Bowel: No disproportionate dilation of the small or large bowel loops. No evidence of abnormal bowel wall  thickening or inflammatory changes. The appendix was not visualized; however there is no acute inflammatory process in the right lower quadrant. Vascular/Lymphatic: There is small-to-moderate ascites, increased since the prior study. No pneumoperitoneum. Redemonstration of extensive enlarged/heterogeneous lymphadenopathy with largest in the portacaval region measuring up to 3.4 x 5.1 cm, grossly similar to the prior study. Redemonstration of irregular soft tissue attenuation nodules mainly in the right abdomen, compatible with peritoneal carcinomatosis no significant interval change since the prior study. No aneurysmal dilation of the major abdominal arteries. There are mild peripheral atherosclerotic vascular calcifications of the aorta and its major branches. Reproductive: Normal size prostate. Symmetric seminal vesicles. Other: There are fat containing umbilical and bilateral inguinal hernias. The soft tissues and abdominal wall are otherwise unremarkable. Musculoskeletal: Stable subcentimeter sized sclerotic foci in the left ischium. There are mild multilevel degenerative changes in the visualized spine. IMPRESSION: 1. No bowel obstruction. 2. Redemonstration of patient's known pancreatic head/uncinate process mass, extensive hepatic metastases, peritoneal carcinomatosis and metastatic lymphadenopathy, as described in detail above. No significant interval change since the recent prior study. 3. Small-to-moderate ascites, increased since  the prior study. 4. There is a 3 x 4 mm solid noncalcified nodule in the left lung lower lobe, grossly similar to the prior study, concerning for metastases. 5. Multiple other nonacute observations, as described above. Aortic Atherosclerosis (ICD10-I70.0). Electronically Signed   By: Jules Schick M.D.   On: 11/26/2023 16:25   DG Hip Unilat With Pelvis 2-3 Views Right Result Date: 11/15/2023 CLINICAL DATA:  Bilateral lower extremity numbness. EXAM: DG HIP (WITH OR WITHOUT  PELVIS) 2-3V RIGHT COMPARISON:  None Available. FINDINGS: There is no evidence of hip fracture or dislocation. Minimal osteophyte formation is seen involving right hip. IMPRESSION: Minimal degenerative changes involving right hip. No acute abnormality seen. Electronically Signed   By: Lupita Raider M.D.   On: 11/15/2023 15:57   CT Angio Chest Pulmonary Embolism (PE) W or WO Contrast Result Date: 11/15/2023 CLINICAL DATA:  Pulmonary embolism (PE) suspected, high prob; Pancreatic cancer, assess treatment response. * Tracking Code: BO * EXAM: CT ANGIOGRAPHY CHEST CT ABDOMEN AND PELVIS WITH CONTRAST TECHNIQUE: Multidetector CT imaging of the chest was performed using the standard protocol during bolus administration of intravenous contrast. Multiplanar CT image reconstructions and MIPs were obtained to evaluate the vascular anatomy. Multidetector CT imaging of the abdomen and pelvis was performed using the standard protocol during bolus administration of intravenous contrast. RADIATION DOSE REDUCTION: This exam was performed according to the departmental dose-optimization program which includes automated exposure control, adjustment of the mA and/or kV according to patient size and/or use of iterative reconstruction technique. CONTRAST:  OMNIPAQUE IOHEXOL 350 MG/ML SOLN COMPARISON:  CT scan chest, abdomen and pelvis from 09/17/2023. FINDINGS: CTA CHEST FINDINGS Cardiovascular: No evidence of embolism to the proximal subsegmental pulmonary artery level. Normal cardiac size. No pericardial effusion. No aortic aneurysm. Mediastinum/Nodes: Visualized thyroid gland appears grossly unremarkable. No solid / cystic mediastinal masses. The esophagus is nondistended precluding optimal assessment. No axillary, mediastinal or hilar lymphadenopathy by size criteria. Lungs/Pleura: The central tracheo-bronchial tree is patent. There are patchy areas of linear, plate-like atelectasis and/or scarring throughout bilateral  lungs. No mass or consolidation. No pleural effusion or pneumothorax. There are 3 new/increased solid noncalcified nodules in bilateral lungs with largest in the upper lobe, posterior segment abutting the right major fissure measuring 6 x 6 mm, highly concerning for metastases. There is a 4 x 5 mm nodule in the right lung lower lobe, posterior inferiorly (series 7, image 102) and a 2 mm nodule in the left lung upper lobe, which are indeterminate. Attention on follow-up examination is recommended. Musculoskeletal: The visualized soft tissues of the chest wall are grossly unremarkable. No suspicious osseous lesions. There are mild multilevel degenerative changes in the visualized spine. Review of the MIP images confirms the above findings. CT ABDOMEN and PELVIS FINDINGS Hepatobiliary: The liver is normal in size. There are innumerable hypoattenuating liver lesions, occupying more than 20% of the total liver parenchyma compatible with metastasis. There is interval increase in the size and number of the lesions when compared to the prior exam. No intrahepatic or extrahepatic bile duct dilation. Gallbladder is surgically absent. Pancreas: There is heterogeneous hypoattenuating mass centered in the pancreatic head/uncinate process, which is grossly similar to the prior study. Spleen: Within normal limits. No focal lesion. Adrenals/Urinary Tract: Adrenal glands are unremarkable. No suspicious renal mass. Multiple bilateral renal sinus cysts noted. No hydronephrosis. No renal or ureteric calculi. Unremarkable urinary bladder. Stomach/Bowel: No disproportionate dilation of the small or large bowel loops. No evidence of abnormal bowel wall  thickening or inflammatory changes. The appendix was not visualized; however there is no acute inflammatory process in the right lower quadrant. Vascular/Lymphatic: There is small ascites mainly in the dependent pelvis, new since the prior study. There is also interval increase in the  several irregular heterogeneous nodules mainly in the right abdomen, compatible with peritoneal carcinomatosis. There is significant interval increase in the previously seen retroperitoneal lymph nodes with largest portacaval nodal mass currently measuring 3.3 x 5.0 cm. There also multiple peripancreatic necrotic lymph nodes which have increased in size. There is also increase in the pericardial/supradiaphragmatic lymph nodes. No aneurysmal dilation of the major abdominal arteries. There are mild peripheral atherosclerotic vascular calcifications of the aorta and its major branches. Reproductive: Normal size prostate. Symmetric seminal vesicles. Other: There is a small fat containing umbilical hernia. There are also small fat containing right inguinal and tiny fat containing left inguinal hernias. The soft tissues and abdominal wall are otherwise unremarkable. Musculoskeletal: No suspicious osseous lesions. Subcentimeter sclerotic foci in the left ischium, indeterminate but unchanged. There are mild multilevel degenerative changes in the visualized spine. Review of the MIP images confirms the above findings. IMPRESSION: 1. No embolism to the proximal subsegmental pulmonary artery level. 2. There is interval increase in the size and number of the liver metastases, peritoneal carcinomatosis and retroperitoneal lymphadenopathy, compatible with disease progression. 3. There are 3 new/increased solid noncalcified nodules in bilateral lungs with largest in the upper lobe, posterior segment abutting the right major fissure measuring 6 x 6 mm, highly concerning for metastases. 4. Multiple other nonacute observations, as described above. Aortic Atherosclerosis (ICD10-I70.0). Electronically Signed   By: Jules Schick M.D.   On: 11/15/2023 15:12   CT ABDOMEN PELVIS W CONTRAST Result Date: 11/15/2023 CLINICAL DATA:  Pulmonary embolism (PE) suspected, high prob; Pancreatic cancer, assess treatment response. * Tracking Code:  BO * EXAM: CT ANGIOGRAPHY CHEST CT ABDOMEN AND PELVIS WITH CONTRAST TECHNIQUE: Multidetector CT imaging of the chest was performed using the standard protocol during bolus administration of intravenous contrast. Multiplanar CT image reconstructions and MIPs were obtained to evaluate the vascular anatomy. Multidetector CT imaging of the abdomen and pelvis was performed using the standard protocol during bolus administration of intravenous contrast. RADIATION DOSE REDUCTION: This exam was performed according to the departmental dose-optimization program which includes automated exposure control, adjustment of the mA and/or kV according to patient size and/or use of iterative reconstruction technique. CONTRAST:  OMNIPAQUE IOHEXOL 350 MG/ML SOLN COMPARISON:  CT scan chest, abdomen and pelvis from 09/17/2023. FINDINGS: CTA CHEST FINDINGS Cardiovascular: No evidence of embolism to the proximal subsegmental pulmonary artery level. Normal cardiac size. No pericardial effusion. No aortic aneurysm. Mediastinum/Nodes: Visualized thyroid gland appears grossly unremarkable. No solid / cystic mediastinal masses. The esophagus is nondistended precluding optimal assessment. No axillary, mediastinal or hilar lymphadenopathy by size criteria. Lungs/Pleura: The central tracheo-bronchial tree is patent. There are patchy areas of linear, plate-like atelectasis and/or scarring throughout bilateral lungs. No mass or consolidation. No pleural effusion or pneumothorax. There are 3 new/increased solid noncalcified nodules in bilateral lungs with largest in the upper lobe, posterior segment abutting the right major fissure measuring 6 x 6 mm, highly concerning for metastases. There is a 4 x 5 mm nodule in the right lung lower lobe, posterior inferiorly (series 7, image 102) and a 2 mm nodule in the left lung upper lobe, which are indeterminate. Attention on follow-up examination is recommended. Musculoskeletal: The visualized soft  tissues of the chest wall are grossly  unremarkable. No suspicious osseous lesions. There are mild multilevel degenerative changes in the visualized spine. Review of the MIP images confirms the above findings. CT ABDOMEN and PELVIS FINDINGS Hepatobiliary: The liver is normal in size. There are innumerable hypoattenuating liver lesions, occupying more than 20% of the total liver parenchyma compatible with metastasis. There is interval increase in the size and number of the lesions when compared to the prior exam. No intrahepatic or extrahepatic bile duct dilation. Gallbladder is surgically absent. Pancreas: There is heterogeneous hypoattenuating mass centered in the pancreatic head/uncinate process, which is grossly similar to the prior study. Spleen: Within normal limits. No focal lesion. Adrenals/Urinary Tract: Adrenal glands are unremarkable. No suspicious renal mass. Multiple bilateral renal sinus cysts noted. No hydronephrosis. No renal or ureteric calculi. Unremarkable urinary bladder. Stomach/Bowel: No disproportionate dilation of the small or large bowel loops. No evidence of abnormal bowel wall thickening or inflammatory changes. The appendix was not visualized; however there is no acute inflammatory process in the right lower quadrant. Vascular/Lymphatic: There is small ascites mainly in the dependent pelvis, new since the prior study. There is also interval increase in the several irregular heterogeneous nodules mainly in the right abdomen, compatible with peritoneal carcinomatosis. There is significant interval increase in the previously seen retroperitoneal lymph nodes with largest portacaval nodal mass currently measuring 3.3 x 5.0 cm. There also multiple peripancreatic necrotic lymph nodes which have increased in size. There is also increase in the pericardial/supradiaphragmatic lymph nodes. No aneurysmal dilation of the major abdominal arteries. There are mild peripheral atherosclerotic vascular  calcifications of the aorta and its major branches. Reproductive: Normal size prostate. Symmetric seminal vesicles. Other: There is a small fat containing umbilical hernia. There are also small fat containing right inguinal and tiny fat containing left inguinal hernias. The soft tissues and abdominal wall are otherwise unremarkable. Musculoskeletal: No suspicious osseous lesions. Subcentimeter sclerotic foci in the left ischium, indeterminate but unchanged. There are mild multilevel degenerative changes in the visualized spine. Review of the MIP images confirms the above findings. IMPRESSION: 1. No embolism to the proximal subsegmental pulmonary artery level. 2. There is interval increase in the size and number of the liver metastases, peritoneal carcinomatosis and retroperitoneal lymphadenopathy, compatible with disease progression. 3. There are 3 new/increased solid noncalcified nodules in bilateral lungs with largest in the upper lobe, posterior segment abutting the right major fissure measuring 6 x 6 mm, highly concerning for metastases. 4. Multiple other nonacute observations, as described above. Aortic Atherosclerosis (ICD10-I70.0). Electronically Signed   By: Jules Schick M.D.   On: 11/15/2023 15:12     Subjective: Patient was seen and examined at bedside.  Overnight events noted.   Patient reports feeling better,  Home oxygen arranged.  Patient wants to be discharged home.  Discharge Exam: Vitals:   11/28/23 1346 11/28/23 2038  BP: 102/74 105/60  Pulse: 87 91  Resp: 14 18  Temp: 97.8 F (36.6 C) (!) 97.5 F (36.4 C)  SpO2:  95%   Vitals:   11/27/23 2103 11/28/23 0539 11/28/23 1346 11/28/23 2038  BP: 91/62 102/74 102/74 105/60  Pulse: 80 87 87 91  Resp: 14 14 14 18   Temp: 98.3 F (36.8 C) 97.8 F (36.6 C) 97.8 F (36.6 C) (!) 97.5 F (36.4 C)  TempSrc: Oral Oral Oral Oral  SpO2: 96% 96%  95%  Weight:   98 kg   Height:   6' (1.829 m)     General: Pt is alert, awake, not in  acute distress Cardiovascular: RRR, S1/S2 +, no rubs, no gallops Respiratory: CTA bilaterally, no wheezing, no rhonchi Abdominal: Soft, NT, Distention noted, bowel sounds + Extremities: no edema, no cyanosis    The results of significant diagnostics from this hospitalization (including imaging, microbiology, ancillary and laboratory) are listed below for reference.     Microbiology: No results found for this or any previous visit (from the past 240 hours).   Labs: BNP (last 3 results) No results for input(s): "BNP" in the last 8760 hours. Basic Metabolic Panel: Recent Labs  Lab 11/26/23 1255 11/27/23 0554 11/28/23 0827 11/29/23 0536  NA 127* 126* 122* 127*  K 4.2 4.2 4.3 4.3  CL 96* 95* 94* 97*  CO2 20* 22 20* 19*  GLUCOSE 115* 110* 95 139*  BUN 15 18 16 19   CREATININE 0.94 0.97 0.79 0.92  CALCIUM 7.6* 7.4* 7.0* 7.4*  MG  --   --  2.0 2.4  PHOS  --   --   --  2.4*   Liver Function Tests: Recent Labs  Lab 11/26/23 1255 11/27/23 0554 11/28/23 0827  AST 29 33 39  ALT 24 23 23   ALKPHOS 382* 362* 367*  BILITOT 1.9* 2.3* 3.2*  PROT 5.9* 5.5* 4.8*  ALBUMIN 1.9* 1.8* 1.6*   Recent Labs  Lab 11/26/23 1255  LIPASE 155*   No results for input(s): "AMMONIA" in the last 168 hours. CBC: Recent Labs  Lab 11/26/23 1255 11/27/23 0554 11/28/23 0827 11/29/23 0536  WBC 48.7* 46.7* 44.5* 47.4*  NEUTROABS  --   --  35.1*  --   HGB 8.4* 8.5* 7.3* 7.5*  HCT 27.8* 27.7* 24.3* 25.2*  MCV 82.2 83.2 83.8 86.0  PLT 325 278 242 265   Cardiac Enzymes: No results for input(s): "CKTOTAL", "CKMB", "CKMBINDEX", "TROPONINI" in the last 168 hours. BNP: Invalid input(s): "POCBNP" CBG: No results for input(s): "GLUCAP" in the last 168 hours. D-Dimer No results for input(s): "DDIMER" in the last 72 hours. Hgb A1c No results for input(s): "HGBA1C" in the last 72 hours. Lipid Profile No results for input(s): "CHOL", "HDL", "LDLCALC", "TRIG", "CHOLHDL", "LDLDIRECT" in the last 72  hours. Thyroid function studies No results for input(s): "TSH", "T4TOTAL", "T3FREE", "THYROIDAB" in the last 72 hours.  Invalid input(s): "FREET3" Anemia work up Recent Labs    11/27/23 0554  FERRITIN 1,093*  TIBC 182*  IRON 29*   Urinalysis    Component Value Date/Time   COLORURINE AMBER (A) 11/26/2023 1416   APPEARANCEUR CLEAR 11/26/2023 1416   LABSPEC 1.021 11/26/2023 1416   PHURINE 5.0 11/26/2023 1416   GLUCOSEU NEGATIVE 11/26/2023 1416   HGBUR NEGATIVE 11/26/2023 1416   BILIRUBINUR NEGATIVE 11/26/2023 1416   KETONESUR NEGATIVE 11/26/2023 1416   PROTEINUR NEGATIVE 11/26/2023 1416   UROBILINOGEN 1.0 07/09/2014 1050   NITRITE NEGATIVE 11/26/2023 1416   LEUKOCYTESUR NEGATIVE 11/26/2023 1416   Sepsis Labs Recent Labs  Lab 11/26/23 1255 11/27/23 0554 11/28/23 0827 11/29/23 0536  WBC 48.7* 46.7* 44.5* 47.4*   Microbiology No results found for this or any previous visit (from the past 240 hours).   Time coordinating discharge: Over 30 minutes  SIGNED:   Willeen Niece, MD  [facility] 11/29/2023, 3:09 PM Pager   If 7PM-7AM, please contact night-coverage

## 2023-11-29 NOTE — Progress Notes (Addendum)
 Discharge instructions reviewed with patient and spouse. All questions answered. All belongings accounted for. Patient to follow up with MD in  1 weeks. Patient medications sent to [facility], to be picked up by spouse. PIV removed.   Waiting for confirmation of o2 delivery from home health and then will arrange transport home via ambulance .  12:26- o2 delivered to bedside. Transport being arranged at this time

## 2023-11-29 NOTE — TOC Transition Note (Signed)
 Transition of Care [facility]) - Discharge Note   Patient Details  Name: Steve Mills MRN: [id number] Date of Birth: 1966-01-24  [facility] CM/SW Contact:  Beckie Busing, RN Phone Number:[phone]  11/29/2023, 12:47 PM   Clinical Narrative:    Patient with discharge orders. Nurse reports that O2 has been delivered. Transportation has been arranged per [facility]. Address had been verified with spouse. Discharge packet is at nurses station in chart. No other TOC needs noted.    Final next level of care: Home w Hospice Care Barriers to Discharge: No Barriers Identified   Patient Goals and CMS Choice Patient states their goals for this hospitalization and ongoing recovery are:: Wants to be able to go home CMS Medicare.gov Compare Post Acute Care list provided to:: Patient Choice offered to / list presented to : Patient, Spouse [place] ownership interest in [facility].provided to::  (n/a)    Discharge Placement                       Discharge Plan and Services Additional resources added to the After Visit Summary for   In-house Referral: NA Discharge Planning Services: CM Consult Post Acute Care Choice: Hospice          DME Arranged: Oxygen (With [facility]) DME Agency: Other - Comment ([facility])       HH Arranged: NA HH Agency:  ([facility]) Date HH Agency Contacted: 11/27/23 Time HH Agency Contacted: 1051 Representative spoke with at [facility] Agency: Henderson Newcomer LPN  Social Drivers of Health (SDOH) Interventions SDOH Screenings   Food Insecurity: No Food Insecurity (11/26/2023)  Housing: Low Risk  (11/26/2023)  Transportation Needs: No Transportation Needs (11/26/2023)  Utilities: Not At Risk (11/26/2023)  Tobacco Use: Medium Risk (11/26/2023)     Readmission Risk Interventions     No data to display

## 2023-11-29 NOTE — Discharge Instructions (Signed)
 Advised to follow-up with primary care physician in 1 week. Patient being discharged home with hospice. Advised to follow-up with Dr. Blake Divine as scheduled. Advised to take sodium chloride tablets 1 g 3 times daily for next few days.

## 2023-11-30 DIAGNOSIS — Z86711 Personal history of pulmonary embolism: Secondary | ICD-10-CM | POA: Diagnosis not present

## 2023-11-30 DIAGNOSIS — R180 Malignant ascites: Secondary | ICD-10-CM | POA: Diagnosis not present

## 2023-11-30 DIAGNOSIS — C779 Secondary and unspecified malignant neoplasm of lymph node, unspecified: Secondary | ICD-10-CM | POA: Diagnosis not present

## 2023-11-30 DIAGNOSIS — C787 Secondary malignant neoplasm of liver and intrahepatic bile duct: Secondary | ICD-10-CM | POA: Diagnosis not present

## 2023-11-30 DIAGNOSIS — I951 Orthostatic hypotension: Secondary | ICD-10-CM | POA: Diagnosis not present

## 2023-11-30 DIAGNOSIS — Z9221 Personal history of antineoplastic chemotherapy: Secondary | ICD-10-CM | POA: Diagnosis not present

## 2023-11-30 DIAGNOSIS — D649 Anemia, unspecified: Secondary | ICD-10-CM | POA: Diagnosis not present

## 2023-11-30 DIAGNOSIS — K219 Gastro-esophageal reflux disease without esophagitis: Secondary | ICD-10-CM | POA: Diagnosis not present

## 2023-11-30 DIAGNOSIS — R112 Nausea with vomiting, unspecified: Secondary | ICD-10-CM | POA: Diagnosis not present

## 2023-11-30 DIAGNOSIS — E669 Obesity, unspecified: Secondary | ICD-10-CM | POA: Diagnosis not present

## 2023-11-30 DIAGNOSIS — C259 Malignant neoplasm of pancreas, unspecified: Secondary | ICD-10-CM | POA: Diagnosis not present

## 2023-11-30 DIAGNOSIS — C7802 Secondary malignant neoplasm of left lung: Secondary | ICD-10-CM | POA: Diagnosis not present

## 2023-12-01 DIAGNOSIS — I951 Orthostatic hypotension: Secondary | ICD-10-CM | POA: Diagnosis not present

## 2023-12-01 DIAGNOSIS — C7802 Secondary malignant neoplasm of left lung: Secondary | ICD-10-CM | POA: Diagnosis not present

## 2023-12-01 DIAGNOSIS — Z9221 Personal history of antineoplastic chemotherapy: Secondary | ICD-10-CM | POA: Diagnosis not present

## 2023-12-01 DIAGNOSIS — E669 Obesity, unspecified: Secondary | ICD-10-CM | POA: Diagnosis not present

## 2023-12-01 DIAGNOSIS — Z86711 Personal history of pulmonary embolism: Secondary | ICD-10-CM | POA: Diagnosis not present

## 2023-12-01 DIAGNOSIS — R112 Nausea with vomiting, unspecified: Secondary | ICD-10-CM | POA: Diagnosis not present

## 2023-12-01 DIAGNOSIS — K219 Gastro-esophageal reflux disease without esophagitis: Secondary | ICD-10-CM | POA: Diagnosis not present

## 2023-12-01 DIAGNOSIS — C787 Secondary malignant neoplasm of liver and intrahepatic bile duct: Secondary | ICD-10-CM | POA: Diagnosis not present

## 2023-12-01 DIAGNOSIS — C779 Secondary and unspecified malignant neoplasm of lymph node, unspecified: Secondary | ICD-10-CM | POA: Diagnosis not present

## 2023-12-01 DIAGNOSIS — R180 Malignant ascites: Secondary | ICD-10-CM | POA: Diagnosis not present

## 2023-12-01 DIAGNOSIS — D649 Anemia, unspecified: Secondary | ICD-10-CM | POA: Diagnosis not present

## 2023-12-02 DIAGNOSIS — I951 Orthostatic hypotension: Secondary | ICD-10-CM | POA: Diagnosis not present

## 2023-12-02 DIAGNOSIS — Z9221 Personal history of antineoplastic chemotherapy: Secondary | ICD-10-CM | POA: Diagnosis not present

## 2023-12-02 DIAGNOSIS — Z86711 Personal history of pulmonary embolism: Secondary | ICD-10-CM | POA: Diagnosis not present

## 2023-12-02 DIAGNOSIS — C787 Secondary malignant neoplasm of liver and intrahepatic bile duct: Secondary | ICD-10-CM | POA: Diagnosis not present

## 2023-12-02 DIAGNOSIS — R112 Nausea with vomiting, unspecified: Secondary | ICD-10-CM | POA: Diagnosis not present

## 2023-12-02 DIAGNOSIS — K219 Gastro-esophageal reflux disease without esophagitis: Secondary | ICD-10-CM | POA: Diagnosis not present

## 2023-12-02 DIAGNOSIS — R180 Malignant ascites: Secondary | ICD-10-CM | POA: Diagnosis not present

## 2023-12-02 DIAGNOSIS — C7802 Secondary malignant neoplasm of left lung: Secondary | ICD-10-CM | POA: Diagnosis not present

## 2023-12-02 DIAGNOSIS — C779 Secondary and unspecified malignant neoplasm of lymph node, unspecified: Secondary | ICD-10-CM | POA: Diagnosis not present

## 2023-12-02 DIAGNOSIS — E669 Obesity, unspecified: Secondary | ICD-10-CM | POA: Diagnosis not present

## 2023-12-02 DIAGNOSIS — D649 Anemia, unspecified: Secondary | ICD-10-CM | POA: Diagnosis not present

## 2023-12-03 ENCOUNTER — Inpatient Hospital Stay: Admitting: Hematology

## 2023-12-03 ENCOUNTER — Inpatient Hospital Stay

## 2023-12-03 DIAGNOSIS — R112 Nausea with vomiting, unspecified: Secondary | ICD-10-CM | POA: Diagnosis not present

## 2023-12-03 DIAGNOSIS — K219 Gastro-esophageal reflux disease without esophagitis: Secondary | ICD-10-CM | POA: Diagnosis not present

## 2023-12-03 DIAGNOSIS — I951 Orthostatic hypotension: Secondary | ICD-10-CM | POA: Diagnosis not present

## 2023-12-03 DIAGNOSIS — Z86711 Personal history of pulmonary embolism: Secondary | ICD-10-CM | POA: Diagnosis not present

## 2023-12-03 DIAGNOSIS — C787 Secondary malignant neoplasm of liver and intrahepatic bile duct: Secondary | ICD-10-CM | POA: Diagnosis not present

## 2023-12-03 DIAGNOSIS — C779 Secondary and unspecified malignant neoplasm of lymph node, unspecified: Secondary | ICD-10-CM | POA: Diagnosis not present

## 2023-12-03 DIAGNOSIS — D649 Anemia, unspecified: Secondary | ICD-10-CM | POA: Diagnosis not present

## 2023-12-03 DIAGNOSIS — E669 Obesity, unspecified: Secondary | ICD-10-CM | POA: Diagnosis not present

## 2023-12-03 DIAGNOSIS — C259 Malignant neoplasm of pancreas, unspecified: Secondary | ICD-10-CM | POA: Diagnosis not present

## 2023-12-03 DIAGNOSIS — C7802 Secondary malignant neoplasm of left lung: Secondary | ICD-10-CM | POA: Diagnosis not present

## 2023-12-03 DIAGNOSIS — Z9221 Personal history of antineoplastic chemotherapy: Secondary | ICD-10-CM | POA: Diagnosis not present

## 2023-12-03 DIAGNOSIS — R180 Malignant ascites: Secondary | ICD-10-CM | POA: Diagnosis not present

## 2023-12-20 DEATH — deceased

## 2024-02-16 IMAGING — CT CT CHEST LUNG CANCER SCREENING LOW DOSE W/O CM
1 of 2 series · 15 of 33 positions shown, 19 images · non-contrast
Comparison: Lung cancer screening CT dated January 19, 2021

CLINICAL DATA: Former smoker with 75 pack-year history



[Series 6: super d · axial · 0.82mm/px · z∈[+959,+1257]mm · 15 of 409 slices shown, 19 images]
[im 18/409  mediastinal]
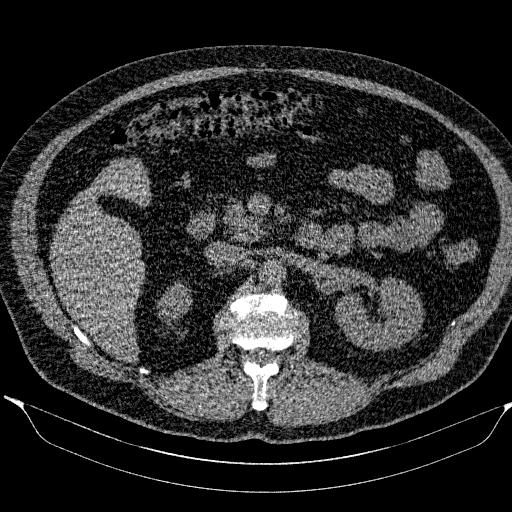
[im 18/409  lung]
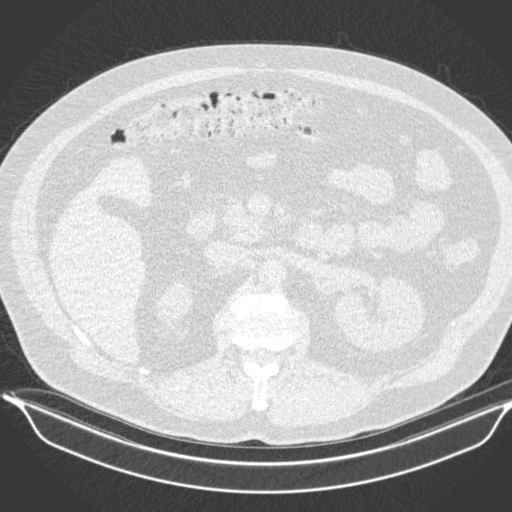
[im 54/409  lung]
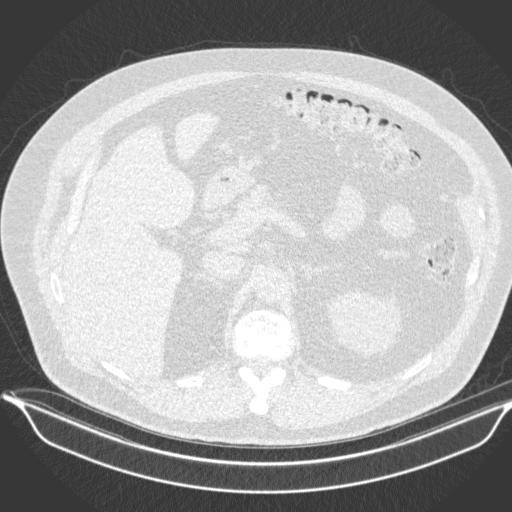
[im 89/409  lung]
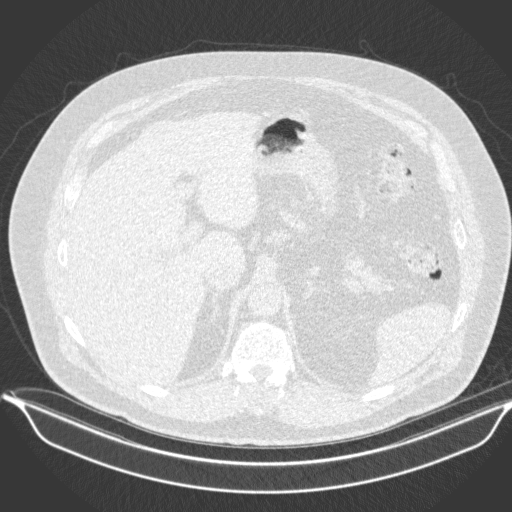
[im 107/409  lung]
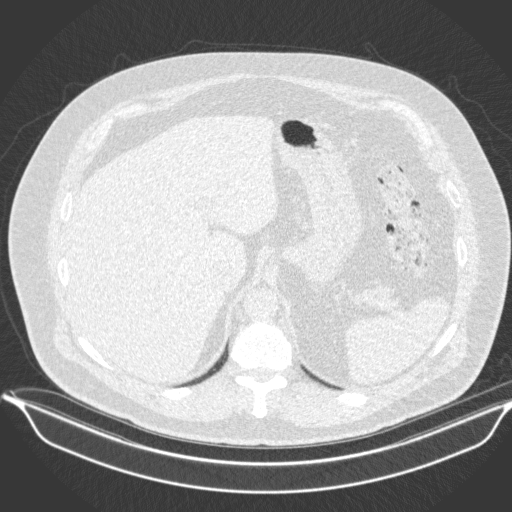
[im 125/409  mediastinal]
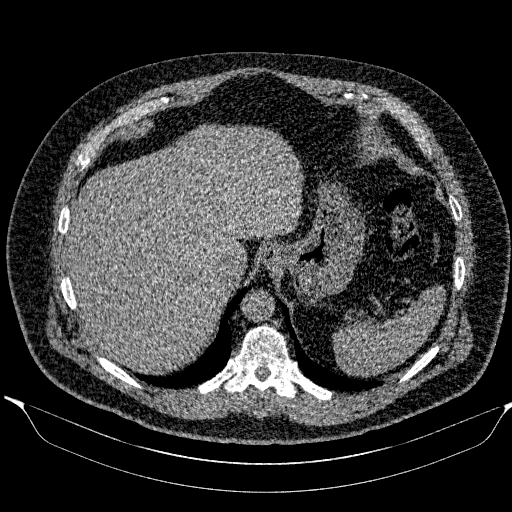
[im 125/409  lung]
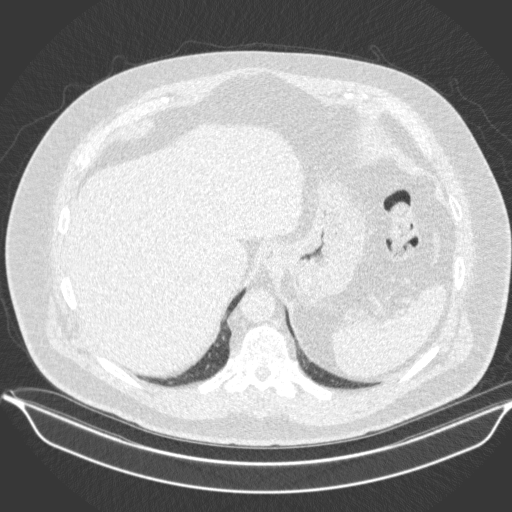
[im 160/409  lung]
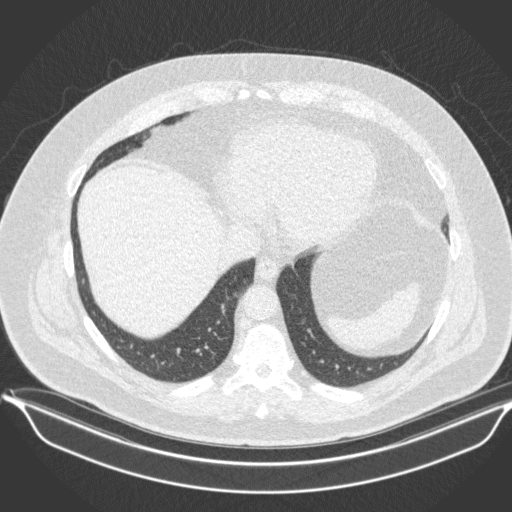
[im 193/409  lung]
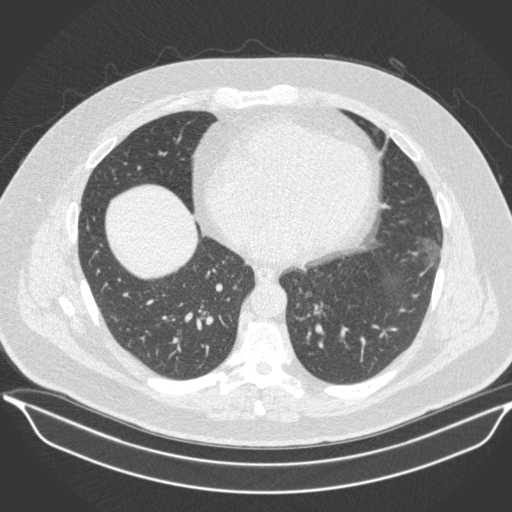
[im 205/409  lung]
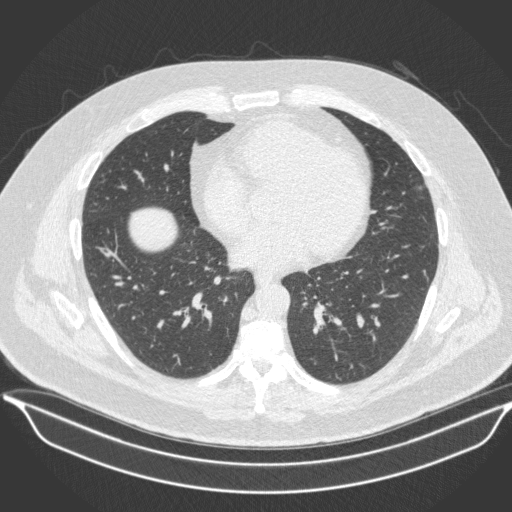
[im 213/409  mediastinal]
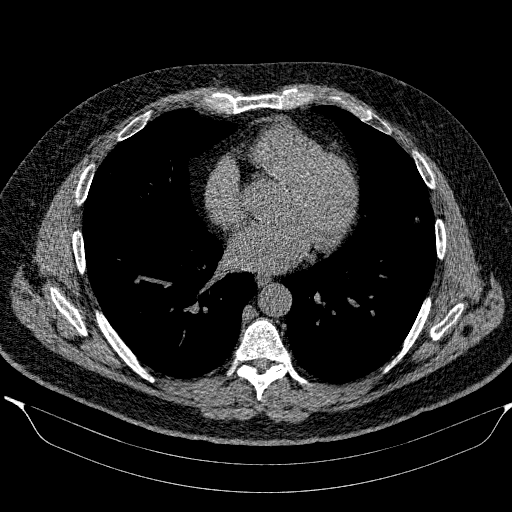
[im 213/409  lung]
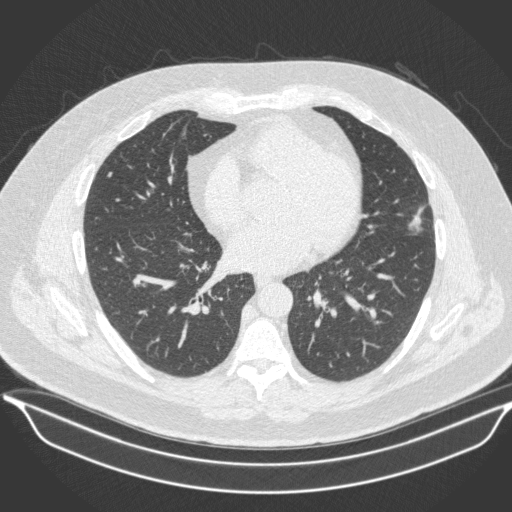
[im 249/409  lung]
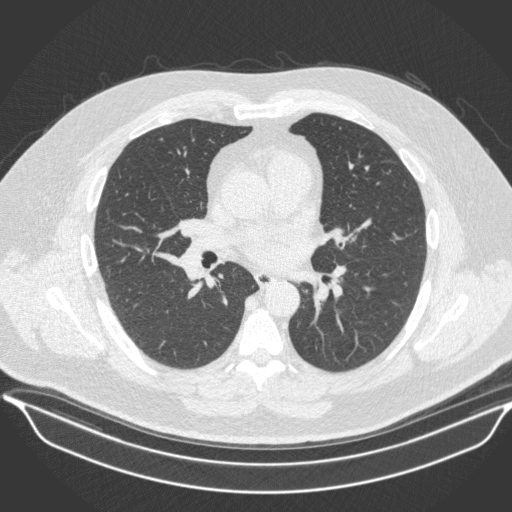
[im 284/409  lung]
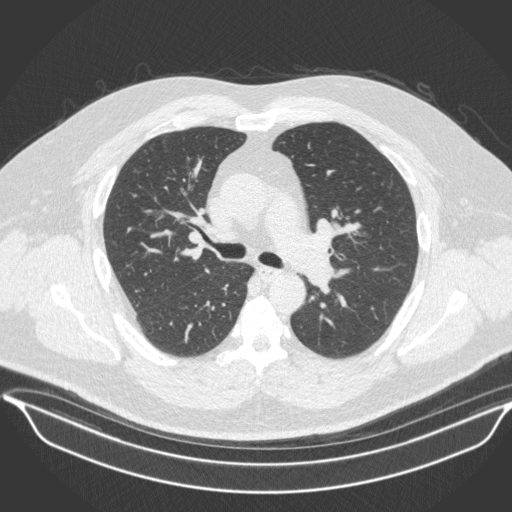
[im 302/409  lung]
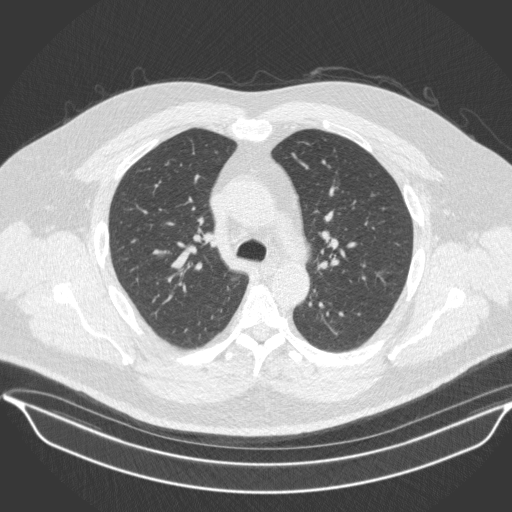
[im 320/409  mediastinal]
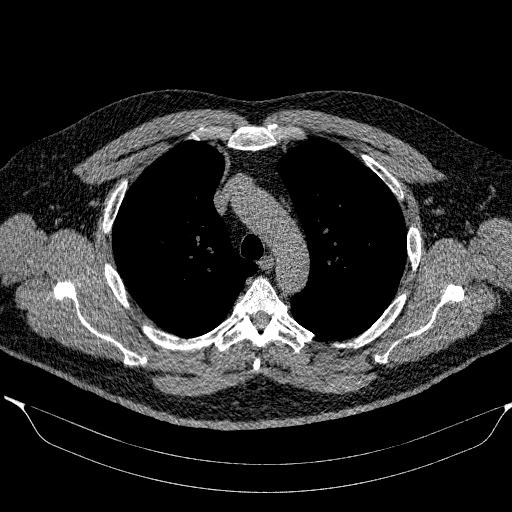
[im 320/409  lung]
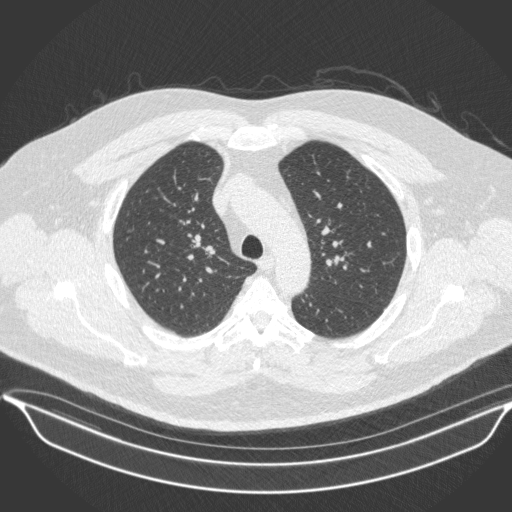
[im 355/409  lung]
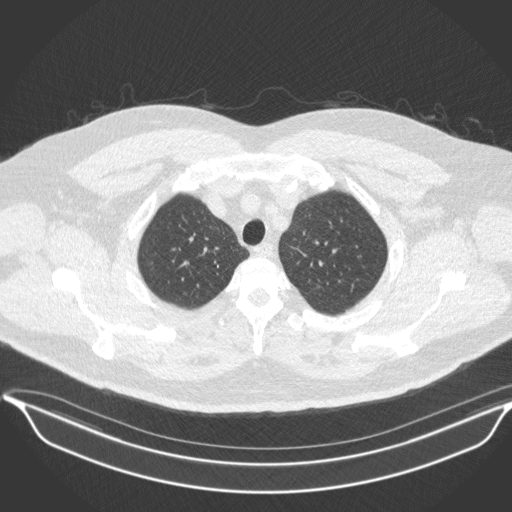
[im 391/409  lung]
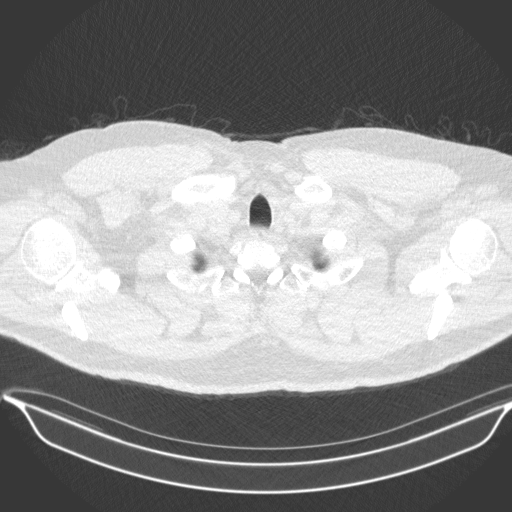

[15 of 33 positions shown; findings below may reference images not displayed]

FINDINGS: Cardiovascular: Heart size. No pericardial effusion. No significant
coronary artery calcifications. Mild atherosclerotic disease of the
thoracic aorta.

Mediastinum/Nodes: Esophagus and thyroid are unremarkable. No
pathologically enlarged lymph nodes seen in the chest.

Lungs/Pleura: Central airways are patent. Mild centrilobular
emphysema. New small solid nodule of the left lower lobe measuring
4.6 mm in mean diameter on image 197. Other previously seen nodules
are stable.

Upper Abdomen: New low-attenuation lesion located adjacent to the
falciform ligament which is likely a simple cyst. New indeterminate
irregular lesion of the right lobe of the liver measuring 1.7 cm on
series 2, image 57. Cholecystectomy clips.

Musculoskeletal: No chest wall mass or suspicious bone lesions
identified.
IMPRESSION: 1. Lung-RADS 3S, probably benign findings. Short-term follow-up in 6
months is recommended with repeat low-dose chest CT without contrast
(please use the following order, CT CHEST LCS NODULE FOLLOW-UP W/O
CM). S modifier for liver lesion.
2. New indeterminate irregular lesion of the right lobe of the liver
measuring 1.2 cm, recommend liver MRI for further evaluation.
3. Aortic Atherosclerosis (140F9-J5Y.Y) and Emphysema (140F9-94Y.0).
